# Patient Record
Sex: Male | Born: 1960 | Race: White | Hispanic: No | Marital: Married | Smoking: Current every day smoker
Health system: Southern US, Community
[De-identification: ages and names within clinical notes are randomized; demographics above are authoritative.]

## PROBLEM LIST (undated history)

## (undated) DIAGNOSIS — T7840XA Allergy, unspecified, initial encounter: Secondary | ICD-10-CM

## (undated) DIAGNOSIS — D329 Benign neoplasm of meninges, unspecified: Secondary | ICD-10-CM

## (undated) DIAGNOSIS — R51 Headache: Secondary | ICD-10-CM

## (undated) DIAGNOSIS — G629 Polyneuropathy, unspecified: Secondary | ICD-10-CM

## (undated) DIAGNOSIS — I10 Essential (primary) hypertension: Secondary | ICD-10-CM

## (undated) DIAGNOSIS — J449 Chronic obstructive pulmonary disease, unspecified: Secondary | ICD-10-CM

## (undated) DIAGNOSIS — E119 Type 2 diabetes mellitus without complications: Secondary | ICD-10-CM

## (undated) DIAGNOSIS — R918 Other nonspecific abnormal finding of lung field: Secondary | ICD-10-CM

## (undated) DIAGNOSIS — R519 Headache, unspecified: Secondary | ICD-10-CM

## (undated) DIAGNOSIS — F1010 Alcohol abuse, uncomplicated: Secondary | ICD-10-CM

## (undated) DIAGNOSIS — G9340 Encephalopathy, unspecified: Secondary | ICD-10-CM

## (undated) DIAGNOSIS — F32A Depression, unspecified: Secondary | ICD-10-CM

## (undated) DIAGNOSIS — R270 Ataxia, unspecified: Secondary | ICD-10-CM

## (undated) DIAGNOSIS — F329 Major depressive disorder, single episode, unspecified: Secondary | ICD-10-CM

## (undated) DIAGNOSIS — D649 Anemia, unspecified: Secondary | ICD-10-CM

## (undated) DIAGNOSIS — D332 Benign neoplasm of brain, unspecified: Secondary | ICD-10-CM

## (undated) DIAGNOSIS — R4189 Other symptoms and signs involving cognitive functions and awareness: Secondary | ICD-10-CM

## (undated) DIAGNOSIS — G5600 Carpal tunnel syndrome, unspecified upper limb: Secondary | ICD-10-CM

## (undated) DIAGNOSIS — J9601 Acute respiratory failure with hypoxia: Secondary | ICD-10-CM

## (undated) DIAGNOSIS — R569 Unspecified convulsions: Secondary | ICD-10-CM

## (undated) DIAGNOSIS — R0600 Dyspnea, unspecified: Secondary | ICD-10-CM

## (undated) DIAGNOSIS — N401 Enlarged prostate with lower urinary tract symptoms: Secondary | ICD-10-CM

## (undated) HISTORY — DX: Benign neoplasm of brain, unspecified: D33.2

## (undated) HISTORY — DX: Polyneuropathy, unspecified: G62.9

## (undated) HISTORY — PX: HERNIA REPAIR: SHX51

## (undated) HISTORY — DX: Essential (primary) hypertension: I10

## (undated) HISTORY — DX: Allergy, unspecified, initial encounter: T78.40XA

## (undated) HISTORY — DX: Carpal tunnel syndrome, unspecified upper limb: G56.00

## (undated) HISTORY — PX: VASECTOMY: SHX75

## (undated) MED FILL — Dexamethasone Sodium Phosphate Inj 100 MG/10ML: INTRAMUSCULAR | Qty: 1 | Status: AC

---
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
## 2009-11-06 ENCOUNTER — Ambulatory Visit: Payer: Self-pay | Admitting: Adult Health

## 2010-04-19 ENCOUNTER — Ambulatory Visit: Payer: Self-pay

## 2010-05-09 DIAGNOSIS — E119 Type 2 diabetes mellitus without complications: Secondary | ICD-10-CM | POA: Insufficient documentation

## 2010-05-22 ENCOUNTER — Ambulatory Visit: Payer: Self-pay

## 2010-06-13 ENCOUNTER — Emergency Department: Payer: Self-pay | Admitting: Emergency Medicine

## 2010-07-16 ENCOUNTER — Ambulatory Visit: Payer: Self-pay

## 2011-01-13 ENCOUNTER — Emergency Department: Payer: Self-pay | Admitting: Unknown Physician Specialty

## 2011-01-19 ENCOUNTER — Emergency Department: Payer: Self-pay | Admitting: Internal Medicine

## 2011-02-01 ENCOUNTER — Emergency Department: Payer: Self-pay | Admitting: Emergency Medicine

## 2011-06-17 DIAGNOSIS — I10 Essential (primary) hypertension: Secondary | ICD-10-CM | POA: Insufficient documentation

## 2014-03-25 ENCOUNTER — Emergency Department: Payer: Self-pay | Admitting: Emergency Medicine

## 2014-03-25 LAB — CBC
HCT: 44.8 % (ref 40.0–52.0)
HGB: 14.8 g/dL (ref 13.0–18.0)
MCH: 33 pg (ref 26.0–34.0)
MCHC: 33 g/dL (ref 32.0–36.0)
MCV: 100 fL (ref 80–100)
Platelet: 185 10*3/uL (ref 150–440)
RBC: 4.49 10*6/uL (ref 4.40–5.90)
RDW: 13.1 % (ref 11.5–14.5)
WBC: 6.9 10*3/uL (ref 3.8–10.6)

## 2014-03-25 LAB — URINALYSIS, COMPLETE
Bilirubin,UR: NEGATIVE
Blood: NEGATIVE
Glucose,UR: NEGATIVE mg/dL (ref 0–75)
Leukocyte Esterase: NEGATIVE
NITRITE: NEGATIVE
Ph: 5 (ref 4.5–8.0)
Protein: NEGATIVE
RBC,UR: 1 /HPF (ref 0–5)
Specific Gravity: 1.027 (ref 1.003–1.030)
Squamous Epithelial: 3
WBC UR: 1 /HPF (ref 0–5)

## 2014-03-25 LAB — COMPREHENSIVE METABOLIC PANEL
ALT: 39 U/L
Albumin: 3.5 g/dL (ref 3.4–5.0)
Alkaline Phosphatase: 74 U/L
Anion Gap: 7 (ref 7–16)
BUN: 7 mg/dL (ref 7–18)
Bilirubin,Total: 0.4 mg/dL (ref 0.2–1.0)
CHLORIDE: 103 mmol/L (ref 98–107)
CO2: 28 mmol/L (ref 21–32)
Calcium, Total: 9.5 mg/dL (ref 8.5–10.1)
Creatinine: 1.09 mg/dL (ref 0.60–1.30)
EGFR (Non-African Amer.): >60
Glucose: 133 mg/dL — ABNORMAL HIGH (ref 65–99)
OSMOLALITY: 276 (ref 275–301)
POTASSIUM: 3.7 mmol/L (ref 3.5–5.1)
SGOT(AST): 39 U/L — ABNORMAL HIGH (ref 15–37)
SODIUM: 138 mmol/L (ref 136–145)
TOTAL PROTEIN: 8.1 g/dL (ref 6.4–8.2)

## 2014-03-26 ENCOUNTER — Emergency Department: Payer: Self-pay | Admitting: Emergency Medicine

## 2014-03-26 LAB — CBC WITH DIFFERENTIAL/PLATELET
Basophil #: 0.1 10*3/uL (ref 0.0–0.1)
Basophil %: 1.2 %
EOS ABS: 0.2 10*3/uL (ref 0.0–0.7)
Eosinophil %: 2.6 %
HCT: 41.2 % (ref 40.0–52.0)
HGB: 14.2 g/dL (ref 13.0–18.0)
LYMPHS ABS: 1.5 10*3/uL (ref 1.0–3.6)
Lymphocyte %: 22.1 %
MCH: 33.8 pg (ref 26.0–34.0)
MCHC: 34.5 g/dL (ref 32.0–36.0)
MCV: 98 fL (ref 80–100)
MONOS PCT: 8.1 %
Monocyte #: 0.5 x10 3/mm (ref 0.2–1.0)
NEUTROS PCT: 66 %
Neutrophil #: 4.4 10*3/uL (ref 1.4–6.5)
PLATELETS: 192 10*3/uL (ref 150–440)
RBC: 4.21 10*6/uL — ABNORMAL LOW (ref 4.40–5.90)
RDW: 13.3 % (ref 11.5–14.5)
WBC: 6.7 10*3/uL (ref 3.8–10.6)

## 2014-03-26 LAB — COMPREHENSIVE METABOLIC PANEL
ALBUMIN: 3 g/dL — AB (ref 3.4–5.0)
ALK PHOS: 55 U/L
AST: 64 U/L — AB (ref 15–37)
Anion Gap: 9 (ref 7–16)
BILIRUBIN TOTAL: 0.3 mg/dL (ref 0.2–1.0)
BUN: 5 mg/dL — AB (ref 7–18)
CALCIUM: 8.3 mg/dL — AB (ref 8.5–10.1)
CHLORIDE: 105 mmol/L (ref 98–107)
CO2: 26 mmol/L (ref 21–32)
CREATININE: 0.9 mg/dL (ref 0.60–1.30)
EGFR (Non-African Amer.): >60
Glucose: 87 mg/dL (ref 65–99)
Osmolality: 276 (ref 275–301)
POTASSIUM: 3.8 mmol/L (ref 3.5–5.1)
SGPT (ALT): 42 U/L
Sodium: 140 mmol/L (ref 136–145)
Total Protein: 7.4 g/dL (ref 6.4–8.2)

## 2014-03-30 LAB — CULTURE, BLOOD (SINGLE)

## 2014-11-02 DIAGNOSIS — N401 Enlarged prostate with lower urinary tract symptoms: Secondary | ICD-10-CM | POA: Insufficient documentation

## 2014-11-02 DIAGNOSIS — R351 Nocturia: Principal | ICD-10-CM

## 2014-11-14 ENCOUNTER — Ambulatory Visit: Payer: Self-pay

## 2014-11-21 LAB — HEPATIC FUNCTION PANEL
ALK PHOS: 93 U/L (ref 25–125)
ALT: 34 U/L (ref 10–40)
AST: 47 U/L — AB (ref 14–40)
Bilirubin, Total: 0.7 mg/dL

## 2014-11-21 LAB — HEMOGLOBIN A1C: Hemoglobin A1C: 5.4

## 2014-11-21 LAB — BASIC METABOLIC PANEL
BUN: 7 mg/dL (ref 4–21)
Creatinine: 0.6 mg/dL (ref 0.6–1.3)
GLUCOSE: 85 mg/dL
Potassium: 4.9 mmol/L (ref 3.4–5.3)
SODIUM: 139 mmol/L (ref 137–147)

## 2014-11-21 LAB — LIPID PANEL
Cholesterol: 202 mg/dL — AB (ref 0–200)
HDL: 104 mg/dL — AB (ref 35–70)
LDL CALC: 86 mg/dL
TRIGLYCERIDES: 60 mg/dL (ref 40–160)

## 2014-11-21 LAB — PSA: PSA: 0.2

## 2014-11-21 LAB — CBC AND DIFFERENTIAL: Neutrophils Absolute: 4 /uL

## 2014-12-19 ENCOUNTER — Ambulatory Visit: Payer: Self-pay

## 2014-12-28 ENCOUNTER — Ambulatory Visit: Payer: Self-pay

## 2014-12-28 LAB — CBC AND DIFFERENTIAL
HEMATOCRIT: 42 % (ref 41–53)
Hemoglobin: 14.4 g/dL (ref 13.5–17.5)
PLATELETS: 136 10*3/uL — AB (ref 150–399)
WBC: 5.7 10^3/mL

## 2014-12-28 LAB — TSH: TSH: 5.3 u[IU]/mL (ref 0.41–5.90)

## 2015-01-03 ENCOUNTER — Other Ambulatory Visit: Payer: Self-pay | Admitting: Nurse Practitioner

## 2015-01-03 DIAGNOSIS — D693 Immune thrombocytopenic purpura: Secondary | ICD-10-CM

## 2015-01-05 ENCOUNTER — Ambulatory Visit: Payer: Self-pay

## 2016-03-26 DIAGNOSIS — R351 Nocturia: Principal | ICD-10-CM

## 2016-03-26 DIAGNOSIS — N401 Enlarged prostate with lower urinary tract symptoms: Secondary | ICD-10-CM

## 2016-03-26 DIAGNOSIS — I10 Essential (primary) hypertension: Secondary | ICD-10-CM

## 2016-03-26 DIAGNOSIS — E119 Type 2 diabetes mellitus without complications: Secondary | ICD-10-CM

## 2016-07-19 ENCOUNTER — Encounter: Payer: Self-pay | Admitting: Emergency Medicine

## 2016-07-19 DIAGNOSIS — E119 Type 2 diabetes mellitus without complications: Secondary | ICD-10-CM | POA: Insufficient documentation

## 2016-07-19 DIAGNOSIS — I10 Essential (primary) hypertension: Secondary | ICD-10-CM | POA: Insufficient documentation

## 2016-07-19 DIAGNOSIS — M545 Low back pain: Secondary | ICD-10-CM | POA: Insufficient documentation

## 2016-07-19 DIAGNOSIS — F17210 Nicotine dependence, cigarettes, uncomplicated: Secondary | ICD-10-CM | POA: Insufficient documentation

## 2016-07-19 LAB — URINALYSIS COMPLETE WITH MICROSCOPIC (ARMC ONLY)
Bilirubin Urine: NEGATIVE
GLUCOSE, UA: NEGATIVE mg/dL
Hgb urine dipstick: NEGATIVE
Ketones, ur: NEGATIVE mg/dL
Leukocytes, UA: NEGATIVE
Nitrite: NEGATIVE
Protein, ur: NEGATIVE mg/dL
RBC / HPF: NONE SEEN RBC/hpf (ref 0–5)
Specific Gravity, Urine: 1.003 — ABNORMAL LOW (ref 1.005–1.030)
pH: 6 (ref 5.0–8.0)

## 2016-07-19 NOTE — ED Triage Notes (Signed)
Pt says he had a seizure about 3 weeks ago; has had back pain since; pain to right mid back; hematuria for more than 3 months; denies fever; pt says he's tried drinking beer, resting and BC powder with no relief;

## 2016-07-20 ENCOUNTER — Emergency Department
Admission: EM | Admit: 2016-07-20 | Discharge: 2016-07-20 | Disposition: A | Discharge status: Home / Self Care | Payer: Self-pay | Attending: Emergency Medicine | Admitting: Emergency Medicine

## 2016-07-20 DIAGNOSIS — M545 Low back pain, unspecified: Secondary | ICD-10-CM

## 2016-07-20 MED ORDER — CARISOPRODOL 350 MG PO TABS: 350.0000 mg | ORAL_TABLET | Freq: Three times a day (TID) | ORAL | 0 refills | Status: DC | PRN

## 2016-07-20 MED ORDER — DICLOFENAC SODIUM 3 % TD GEL
1.0000 | Freq: Two times a day (BID) | TRANSDERMAL | 0 refills | Status: DC | PRN
Start: 2016-07-20 — End: 2016-10-16

## 2016-07-20 NOTE — ED Notes (Signed)
Reviewed d/c instructions, follow-up care, prescriptions with pt. Pt verbalized understanding.

## 2016-07-20 NOTE — ED Notes (Signed)
MD Schaevitz at bedside. 

## 2016-07-20 NOTE — ED Notes (Signed)
Pt reports having a seizure 06/29/2016. Pt reports right lower back pain since seizure. PT reports taking OTC pain meds with no relief of pain. Pt reports he did not come to hospital after seizure. Pt has no prior history of epilepsy.   Pt c/o hematuria X 3 months. Pt c/o decreased urinary flow strength. Pt denies discharge.

## 2016-07-20 NOTE — ED Provider Notes (Signed)
[facility] Emergency Department Provider Note  ____________________________________________   First MD Initiated Contact with Patient 07/20/16 0028     (approximate)  I have reviewed the triage vital signs and the nursing notes.   HISTORY  Chief Complaint Back Pain and Hematuria    HPI Frederick Drake is a 55 y.o. male  with a history of hypertension and diabetes who is presenting with 3 weeks of right lower back pain. 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 He says that the pain started after seizure he had no sleep 3 weeks ago. He says that the seizure happened in the middle the night and he had associated confusion afterwards. He says that EMS was called that time but he did not seek further evaluation. He has not had any headache since. He has not had any other seizures. He says that he usually has 6-12 beers a day and was not drinking the day prior to the seizure because he says he was feeling "sick." No history of seizures. No unintentional weight loss. Says that he has also had intermittent hematuria over the past 3 months but says that his urine appears clear tonight. Patient is a half-pack a day smoker.    Past Medical History:  Diagnosis Date  . Allergy    Seasonal  . Carpal tunnel syndrome   . Hypertension   . Neuropathy ([place])    Both legs    Patient Active Problem List   Diagnosis Date Noted  . 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 Hypertension 06/17/2011  . Diabetes ([place]) 05/09/2010    Past Surgical History:  Procedure Laterality Date  . HERNIA REPAIR    . VASECTOMY      Prior to Admission medications   Medication Sig Start Date End Date Taking? Authorizing Provider  carisoprodol (SOMA) 350 MG tablet Take 1 tablet (350 mg total) by mouth 3 (three) times daily as needed for muscle spasms. 07/20/16 07/20/17  Orbie Pyo, MD  Diclofenac Sodium 3 % GEL Place  1 application onto the skin every 12 (twelve) hours as needed (pain). 07/20/16   Orbie Pyo, MD  finasteride (PROSCAR) 5 MG tablet Take 5 mg by mouth daily.    Historical Provider, MD  gabapentin (NEURONTIN) 300 MG capsule Take 300 mg by mouth 3 (three) times daily.    Historical Provider, MD  lisinopril (PRINIVIL,ZESTRIL) 20 MG tablet Take 20 mg by mouth daily.    Historical Provider, MD  metoprolol (LOPRESSOR) 50 MG tablet Take 50 mg by mouth 2 (two) times daily.    Historical Provider, MD  silodosin (RAPAFLO) 8 MG CAPS capsule Take 8 mg by mouth daily with breakfast.    Historical Provider, MD    Allergies Penicillins  Family History  Problem Relation Age of Onset  . Cancer Sister     Breast    Social History Social History  Substance Use Topics  . Smoking status: Current Every Day Smoker    Packs/day: 2.00    Years: 40.00    Types: Cigarettes  . Smokeless tobacco: Never Used  . Alcohol use Yes    Review of Systems Constitutional: No fever/chills Eyes: No visual changes. ENT: No sore throat. Cardiovascular: Denies chest pain. Respiratory: Denies shortness of breath. Gastrointestinal: No abdominal pain.  No nausea, no vomiting.  No diarrhea.  No constipation. Genitourinary: Negative for dysuria. Musculoskeletal: As above Skin: Negative for  rash. Neurological: Negative for headaches, focal weakness or numbness.  10-point ROS otherwise negative.  ____________________________________________   PHYSICAL EXAM:  VITAL SIGNS: ED Triage Vitals  Enc Vitals Group     BP 07/19/16 2330 (!) 147/72     Pulse Rate 07/19/16 2330 89     Resp 07/19/16 2330 18     Temp 07/19/16 2330 97.9 F (36.6 C)     Temp Source 07/19/16 2330 Oral     SpO2 07/19/16 2330 93 %     Weight 07/19/16 2330 250 lb (113.4 kg)     Height 07/19/16 2330 6\' 1"  (1.854 m)     Head Circumference --      Peak Flow --      Pain Score 07/19/16 2331 8     Pain Loc --      Pain Edu? --       Excl. in [place]? --     Constitutional: Alert and oriented. Well appearing and in no acute distress. Eyes: Conjunctivae are normal. PERRL. EOMI. Head: Atraumatic. Nose: No congestion/rhinnorhea. Mouth/Throat: Mucous membranes are moist.   Neck: No stridor.   Cardiovascular: Normal rate, regular rhythm. Grossly normal heart sounds.   Respiratory: Normal respiratory effort.  No retractions. Lungs CTAB. Gastrointestinal: Soft and nontender. No distention.  Musculoskeletal: No lower extremity tenderness nor edema.  Tenderness to the right lower lumbar muscle groups with nodularity that could very well be spasmed muscles. No midline tenderness or deformity. No bruising/ecchymosis. Neurologic:  Normal speech and language. No gross focal neurologic deficits are appreciated. No gait instability. Skin:  Skin is warm, dry and intact. No rash noted. Psychiatric: Mood and affect are normal. Speech and behavior are normal.  ____________________________________________   LABS (all labs ordered are listed, but only abnormal results are displayed)  Labs Reviewed  URINALYSIS COMPLETEWITH MICROSCOPIC ([place]) - Abnormal; Notable for the following:       Result Value   Color, Urine STRAW (*)    APPearance CLEAR (*)    Specific Gravity, Urine 1.003 (*)    Bacteria, UA RARE (*)    Squamous Epithelial / LPF 0-5 (*)    All other components within normal limits   ____________________________________________  EKG   ____________________________________________  RADIOLOGY   ____________________________________________   PROCEDURES  Procedure(s) performed:   Procedures  Critical Care performed:   ____________________________________________   INITIAL IMPRESSION / ASSESSMENT AND PLAN / ED COURSE  Pertinent labs & imaging results that were available during my care of the patient were reviewed by me and considered in my medical decision making (see chart for details).    Clinical  Course   I had a lengthy discussion about how it is abnormal to have a seizure especially in the mid 67s. I'm also concerned because of the intermittent hematuria. I offered the patient a further workup including imaging as well as blood work but he just wanted pain medications as that he will try to follow up at the upper clinic where he has been seen before. We discussed the possibility of a brain tumor causing possible seizure. It is possible he may have a mass in his abdomen or back as well that could be contributing. He is understanding of these risks. He says that he would rather follow-up as an outpatient and will take the pain control medication. Patient is clinically sober at this time. Has capacity to make medical decisions.    ____________________________________________   FINAL CLINICAL IMPRESSION(S) / ED DIAGNOSES  Final diagnoses:  Right-sided low back pain without sciatica, unspecified chronicity      NEW MEDICATIONS STARTED DURING THIS VISIT:  New Prescriptions   CARISOPRODOL (SOMA) 350 MG TABLET    Take 1 tablet (350 mg total) by mouth 3 (three) times daily as needed for muscle spasms.   DICLOFENAC SODIUM 3 % GEL    Place 1 application onto the skin every 12 (twelve) hours as needed (pain).     Note:  This document was prepared using Dragon voice recognition software and may include unintentional dictation errors.     Orbie Pyo, MD 07/20/16 [phone]

## 2016-07-20 NOTE — ED Notes (Signed)
Pt reports he has not taken any of his medications in 6 months. Pt reports his truck broke down and cannot get to MD to get prescriptions to have meds refilled.

## 2016-08-18 DIAGNOSIS — I471 Supraventricular tachycardia, unspecified: Secondary | ICD-10-CM

## 2016-08-18 HISTORY — DX: Supraventricular tachycardia, unspecified: I47.10

## 2016-10-16 ENCOUNTER — Emergency Department
Admission: EM | Admit: 2016-10-16 | Discharge: 2016-10-16 | Disposition: A | Discharge status: Other Acute Inpatient Hospital | Payer: Self-pay | Attending: Emergency Medicine | Admitting: Emergency Medicine

## 2016-10-16 ENCOUNTER — Emergency Department: Payer: Self-pay

## 2016-10-16 ENCOUNTER — Encounter: Payer: Self-pay | Admitting: Emergency Medicine

## 2016-10-16 ENCOUNTER — Inpatient Hospital Stay (HOSPITAL_COMMUNITY)
Admission: AD | Admit: 2016-10-16 | Discharge: 2016-10-25 | DRG: 100 | Disposition: A | Discharge status: Home / Self Care | Payer: Self-pay | Source: Other Acute Inpatient Hospital | Attending: Internal Medicine | Admitting: Internal Medicine

## 2016-10-16 ENCOUNTER — Inpatient Hospital Stay (HOSPITAL_COMMUNITY): Payer: Self-pay

## 2016-10-16 DIAGNOSIS — G9389 Other specified disorders of brain: Secondary | ICD-10-CM

## 2016-10-16 DIAGNOSIS — R569 Unspecified convulsions: Secondary | ICD-10-CM

## 2016-10-16 DIAGNOSIS — IMO0002 Reserved for concepts with insufficient information to code with codable children: Secondary | ICD-10-CM

## 2016-10-16 DIAGNOSIS — R451 Restlessness and agitation: Secondary | ICD-10-CM | POA: Diagnosis not present

## 2016-10-16 DIAGNOSIS — J069 Acute upper respiratory infection, unspecified: Secondary | ICD-10-CM | POA: Diagnosis not present

## 2016-10-16 DIAGNOSIS — Z880 Allergy status to penicillin: Secondary | ICD-10-CM

## 2016-10-16 DIAGNOSIS — D696 Thrombocytopenia, unspecified: Secondary | ICD-10-CM | POA: Diagnosis present

## 2016-10-16 DIAGNOSIS — E872 Acidosis, unspecified: Secondary | ICD-10-CM

## 2016-10-16 DIAGNOSIS — N400 Enlarged prostate without lower urinary tract symptoms: Secondary | ICD-10-CM | POA: Diagnosis present

## 2016-10-16 DIAGNOSIS — R4701 Aphasia: Secondary | ICD-10-CM

## 2016-10-16 DIAGNOSIS — D329 Benign neoplasm of meninges, unspecified: Secondary | ICD-10-CM

## 2016-10-16 DIAGNOSIS — D320 Benign neoplasm of cerebral meninges: Secondary | ICD-10-CM | POA: Diagnosis present

## 2016-10-16 DIAGNOSIS — G629 Polyneuropathy, unspecified: Secondary | ICD-10-CM | POA: Diagnosis present

## 2016-10-16 DIAGNOSIS — G939 Disorder of brain, unspecified: Secondary | ICD-10-CM

## 2016-10-16 DIAGNOSIS — E871 Hypo-osmolality and hyponatremia: Secondary | ICD-10-CM | POA: Diagnosis present

## 2016-10-16 DIAGNOSIS — I10 Essential (primary) hypertension: Secondary | ICD-10-CM | POA: Insufficient documentation

## 2016-10-16 DIAGNOSIS — E119 Type 2 diabetes mellitus without complications: Secondary | ICD-10-CM | POA: Insufficient documentation

## 2016-10-16 DIAGNOSIS — G40901 Epilepsy, unspecified, not intractable, with status epilepticus: Principal | ICD-10-CM

## 2016-10-16 DIAGNOSIS — F1010 Alcohol abuse, uncomplicated: Secondary | ICD-10-CM | POA: Insufficient documentation

## 2016-10-16 DIAGNOSIS — J9601 Acute respiratory failure with hypoxia: Secondary | ICD-10-CM | POA: Diagnosis present

## 2016-10-16 DIAGNOSIS — J988 Other specified respiratory disorders: Secondary | ICD-10-CM

## 2016-10-16 DIAGNOSIS — F10239 Alcohol dependence with withdrawal, unspecified: Secondary | ICD-10-CM | POA: Diagnosis present

## 2016-10-16 DIAGNOSIS — J9600 Acute respiratory failure, unspecified whether with hypoxia or hypercapnia: Secondary | ICD-10-CM

## 2016-10-16 DIAGNOSIS — F17210 Nicotine dependence, cigarettes, uncomplicated: Secondary | ICD-10-CM | POA: Insufficient documentation

## 2016-10-16 DIAGNOSIS — J9690 Respiratory failure, unspecified, unspecified whether with hypoxia or hypercapnia: Secondary | ICD-10-CM

## 2016-10-16 DIAGNOSIS — R471 Dysarthria and anarthria: Secondary | ICD-10-CM | POA: Diagnosis present

## 2016-10-16 DIAGNOSIS — G40823 Epileptic spasms, intractable, with status epilepticus: Secondary | ICD-10-CM | POA: Insufficient documentation

## 2016-10-16 DIAGNOSIS — Z9181 History of falling: Secondary | ICD-10-CM

## 2016-10-16 DIAGNOSIS — E876 Hypokalemia: Secondary | ICD-10-CM | POA: Diagnosis present

## 2016-10-16 DIAGNOSIS — Z0189 Encounter for other specified special examinations: Secondary | ICD-10-CM

## 2016-10-16 DIAGNOSIS — G9340 Encephalopathy, unspecified: Secondary | ICD-10-CM

## 2016-10-16 DIAGNOSIS — F1090 Alcohol use, unspecified, uncomplicated: Secondary | ICD-10-CM

## 2016-10-16 DIAGNOSIS — Z79899 Other long term (current) drug therapy: Secondary | ICD-10-CM | POA: Insufficient documentation

## 2016-10-16 DIAGNOSIS — R739 Hyperglycemia, unspecified: Secondary | ICD-10-CM | POA: Diagnosis present

## 2016-10-16 DIAGNOSIS — G936 Cerebral edema: Secondary | ICD-10-CM | POA: Diagnosis present

## 2016-10-16 DIAGNOSIS — E538 Deficiency of other specified B group vitamins: Secondary | ICD-10-CM | POA: Diagnosis present

## 2016-10-16 HISTORY — DX: Unspecified convulsions: R56.9

## 2016-10-16 LAB — CBC WITH DIFFERENTIAL/PLATELET
Basophils Absolute: 0.1 10*3/uL (ref 0–0.1)
Basophils Relative: 1 %
Eosinophils Absolute: 0.1 10*3/uL (ref 0–0.7)
Eosinophils Relative: 1 %
HEMATOCRIT: 47.2 % (ref 40.0–52.0)
HEMOGLOBIN: 15.3 g/dL (ref 13.0–18.0)
LYMPHS ABS: 2.3 10*3/uL (ref 1.0–3.6)
LYMPHS PCT: 17 %
MCH: 32.8 pg (ref 26.0–34.0)
MCHC: 32.4 g/dL (ref 32.0–36.0)
MCV: 101.2 fL — AB (ref 80.0–100.0)
Monocytes Absolute: 0.8 10*3/uL (ref 0.2–1.0)
Monocytes Relative: 6 %
NEUTROS ABS: 9.9 10*3/uL — AB (ref 1.4–6.5)
NEUTROS PCT: 75 %
Platelets: 171 10*3/uL (ref 150–440)
RBC: 4.66 MIL/uL (ref 4.40–5.90)
RDW: 13.4 % (ref 11.5–14.5)
WBC: 13.3 10*3/uL — AB (ref 3.8–10.6)

## 2016-10-16 LAB — BLOOD GAS, ARTERIAL
Acid-Base Excess: 3.7 mmol/L — ABNORMAL HIGH (ref 0.0–2.0)
Acid-base deficit: 3 mmol/L — ABNORMAL HIGH (ref 0.0–2.0)
Bicarbonate: 25.7 mmol/L (ref 20.0–28.0)
Bicarbonate: 26.6 mmol/L (ref 20.0–28.0)
DRAWN BY: 2520131
FIO2: 0.45
FIO2: 0.6
LHR: 20 {breaths}/min
MECHVT: 500 mL
O2 SAT: 99.3 %
O2 Saturation: 97.5 %
PATIENT TEMPERATURE: 98.6
PCO2 ART: 32.5 mmHg (ref 32.0–48.0)
PEEP: 5 cmH2O
PEEP: 5 cmH2O
PH ART: 7.523 — AB (ref 7.350–7.450)
PO2 ART: 111 mmHg — AB (ref 83.0–108.0)
PO2 ART: 207 mmHg — AB (ref 83.0–108.0)
Patient temperature: 37
RATE: 20 resp/min
VT: 570 mL
pCO2 arterial: 60 mmHg — ABNORMAL HIGH (ref 32.0–48.0)
pH, Arterial: 7.24 — ABNORMAL LOW (ref 7.350–7.450)

## 2016-10-16 LAB — URINE DRUG SCREEN, QUALITATIVE (ARMC ONLY)
AMPHETAMINES, UR SCREEN: NOT DETECTED
BENZODIAZEPINE, UR SCRN: NOT DETECTED
Barbiturates, Ur Screen: NOT DETECTED
Cannabinoid 50 Ng, Ur ~~LOC~~: POSITIVE — AB
Cocaine Metabolite,Ur ~~LOC~~: NOT DETECTED
MDMA (Ecstasy)Ur Screen: NOT DETECTED
METHADONE SCREEN, URINE: NOT DETECTED
Opiate, Ur Screen: NOT DETECTED
Phencyclidine (PCP) Ur S: NOT DETECTED
Tricyclic, Ur Screen: NOT DETECTED

## 2016-10-16 LAB — GLUCOSE, CAPILLARY: Glucose-Capillary: 193 mg/dL — ABNORMAL HIGH (ref 65–99)

## 2016-10-16 LAB — COMPREHENSIVE METABOLIC PANEL
ALK PHOS: 94 U/L (ref 38–126)
ALT: 42 U/L (ref 17–63)
AST: 79 U/L — ABNORMAL HIGH (ref 15–41)
Albumin: 4.2 g/dL (ref 3.5–5.0)
Anion gap: 24 — ABNORMAL HIGH (ref 5–15)
BILIRUBIN TOTAL: 0.6 mg/dL (ref 0.3–1.2)
BUN: 7 mg/dL (ref 6–20)
CALCIUM: 9.1 mg/dL (ref 8.9–10.3)
CO2: 14 mmol/L — AB (ref 22–32)
CREATININE: 1.04 mg/dL (ref 0.61–1.24)
Chloride: 96 mmol/L — ABNORMAL LOW (ref 101–111)
GFR calc non Af Amer: >60 mL/min (ref >60)
Glucose, Bld: 221 mg/dL — ABNORMAL HIGH (ref 65–99)
Potassium: 3.5 mmol/L (ref 3.5–5.1)
SODIUM: 134 mmol/L — AB (ref 135–145)
Total Protein: 8.6 g/dL — ABNORMAL HIGH (ref 6.5–8.1)

## 2016-10-16 LAB — URINALYSIS, COMPLETE (UACMP) WITH MICROSCOPIC
Bilirubin Urine: NEGATIVE
GLUCOSE, UA: 150 mg/dL — AB
Ketones, ur: 20 mg/dL — AB
Leukocytes, UA: NEGATIVE
Nitrite: NEGATIVE
PROTEIN: 100 mg/dL — AB
Specific Gravity, Urine: 1.012 (ref 1.005–1.030)
pH: 5 (ref 5.0–8.0)

## 2016-10-16 LAB — TROPONIN I: Troponin I: <0.03 ng/mL (ref <0.03)

## 2016-10-16 LAB — ETHANOL

## 2016-10-16 LAB — LACTIC ACID, PLASMA
LACTIC ACID, VENOUS: 5 mmol/L — AB (ref 0.5–1.9)
Lactic Acid, Venous: 11.6 mmol/L (ref 0.5–1.9)

## 2016-10-16 MED ORDER — MIDAZOLAM HCL 2 MG/2ML IJ SOLN
2.0000 mg | INTRAMUSCULAR | Status: DC | PRN
  Administered 2016-10-17 – 2016-10-19 (×7): 2 mg via INTRAVENOUS
  Filled 2016-10-16 (×7): qty 2

## 2016-10-16 MED ORDER — SODIUM CHLORIDE 0.9 % IV SOLN: 1.0000 mg | Freq: Once | INTRAVENOUS | Status: DC

## 2016-10-16 MED ORDER — DEXAMETHASONE SODIUM PHOSPHATE 10 MG/ML IJ SOLN
10.0000 mg | Freq: Once | INTRAMUSCULAR | Status: AC
  Administered 2016-10-16: 10 mg via INTRAVENOUS
  Filled 2016-10-16: qty 1

## 2016-10-16 MED ORDER — PROPOFOL 1000 MG/100ML IV EMUL
5.0000 ug/kg/min | Freq: Once | INTRAVENOUS | Status: AC
Start: 2016-10-16 — End: 2016-10-16
  Administered 2016-10-16 (×2): 100 ug/kg/min via INTRAVENOUS
  Administered 2016-10-16: 5 ug/kg/min via INTRAVENOUS

## 2016-10-16 MED ORDER — CHLORHEXIDINE GLUCONATE 0.12% ORAL RINSE (MEDLINE KIT)
15.0000 mL | Freq: Two times a day (BID) | OROMUCOSAL | Status: DC
  Administered 2016-10-17 – 2016-10-21 (×10): 15 mL via OROMUCOSAL

## 2016-10-16 MED ORDER — THIAMINE HCL 100 MG/ML IJ SOLN
100.0000 mg | Freq: Every day | INTRAMUSCULAR | Status: DC
  Administered 2016-10-17: 100 mg via INTRAVENOUS
  Filled 2016-10-16: qty 2

## 2016-10-16 MED ORDER — LORAZEPAM 2 MG/ML IJ SOLN
2.0000 mg | INTRAMUSCULAR | Status: DC | PRN
  Administered 2016-10-19: 2 mg via INTRAVENOUS
  Filled 2016-10-16: qty 1

## 2016-10-16 MED ORDER — INSULIN ASPART 100 UNIT/ML ~~LOC~~ SOLN
2.0000 [IU] | SUBCUTANEOUS | Status: DC
  Administered 2016-10-17 (×2): 4 [IU] via SUBCUTANEOUS
  Administered 2016-10-17: 2 [IU] via SUBCUTANEOUS
  Administered 2016-10-17 (×2): 6 [IU] via SUBCUTANEOUS
  Administered 2016-10-17: 2 [IU] via SUBCUTANEOUS
  Administered 2016-10-18 – 2016-10-19 (×6): 4 [IU] via SUBCUTANEOUS
  Administered 2016-10-19: 2 [IU] via SUBCUTANEOUS
  Administered 2016-10-19: 4 [IU] via SUBCUTANEOUS
  Administered 2016-10-19: 2 [IU] via SUBCUTANEOUS
  Administered 2016-10-19 – 2016-10-20 (×4): 4 [IU] via SUBCUTANEOUS
  Administered 2016-10-20: 2 [IU] via SUBCUTANEOUS
  Administered 2016-10-20: 4 [IU] via SUBCUTANEOUS
  Administered 2016-10-20: 2 [IU] via SUBCUTANEOUS
  Administered 2016-10-20: 6 [IU] via SUBCUTANEOUS
  Administered 2016-10-21: 4 [IU] via SUBCUTANEOUS
  Administered 2016-10-21 (×2): 2 [IU] via SUBCUTANEOUS
  Administered 2016-10-21 – 2016-10-22 (×3): 4 [IU] via SUBCUTANEOUS
  Administered 2016-10-22 – 2016-10-23 (×4): 2 [IU] via SUBCUTANEOUS
  Administered 2016-10-24: 4 [IU] via SUBCUTANEOUS
  Administered 2016-10-25: 2 [IU] via SUBCUTANEOUS

## 2016-10-16 MED ORDER — ETOMIDATE 2 MG/ML IV SOLN
20.0000 mg | Freq: Once | INTRAVENOUS | Status: AC
  Administered 2016-10-16: 20 mg via INTRAVENOUS

## 2016-10-16 MED ORDER — SODIUM CHLORIDE 0.9 % IV SOLN
INTRAVENOUS | Status: DC
  Administered 2016-10-16 – 2016-10-18 (×3): via INTRAVENOUS

## 2016-10-16 MED ORDER — PROPOFOL 1000 MG/100ML IV EMUL
INTRAVENOUS | Status: AC
  Administered 2016-10-16: 100 ug/kg/min via INTRAVENOUS
  Filled 2016-10-16: qty 100

## 2016-10-16 MED ORDER — ACETAMINOPHEN 650 MG RE SUPP
650.0000 mg | RECTAL | Status: DC | PRN
Start: 2016-10-16 — End: 2016-10-25

## 2016-10-16 MED ORDER — SUCCINYLCHOLINE CHLORIDE 20 MG/ML IJ SOLN
100.0000 mg | Freq: Once | INTRAMUSCULAR | Status: AC
  Administered 2016-10-16: 100 mg via INTRAVENOUS

## 2016-10-16 MED ORDER — SODIUM BICARBONATE 8.4 % IV SOLN
50.0000 meq | Freq: Once | INTRAVENOUS | Status: AC
  Administered 2016-10-16: 50 meq via INTRAVENOUS
  Filled 2016-10-16: qty 50

## 2016-10-16 MED ORDER — PANTOPRAZOLE SODIUM 40 MG PO PACK
40.0000 mg | PACK | Freq: Every day | ORAL | Status: DC
  Administered 2016-10-17 – 2016-10-20 (×5): 40 mg
  Filled 2016-10-16 (×5): qty 20

## 2016-10-16 MED ORDER — DEXMEDETOMIDINE HCL IN NACL 400 MCG/100ML IV SOLN
INTRAVENOUS | Status: AC
  Administered 2016-10-16: 0.5 ug/kg/h
  Filled 2016-10-16: qty 100

## 2016-10-16 MED ORDER — LORAZEPAM 2 MG/ML IJ SOLN
1.0000 mg | Freq: Once | INTRAMUSCULAR | Status: AC
  Administered 2016-10-16: 1 mg via INTRAVENOUS
  Filled 2016-10-16: qty 1

## 2016-10-16 MED ORDER — MIDAZOLAM HCL 5 MG/5ML IJ SOLN
4.0000 mg | Freq: Once | INTRAMUSCULAR | Status: AC
  Administered 2016-10-16: 4 mg via INTRAVENOUS

## 2016-10-16 MED ORDER — FENTANYL CITRATE (PF) 100 MCG/2ML IJ SOLN
100.0000 ug | INTRAMUSCULAR | Status: DC | PRN
  Administered 2016-10-18 – 2016-10-19 (×4): 100 ug via INTRAVENOUS

## 2016-10-16 MED ORDER — LORAZEPAM 2 MG/ML IJ SOLN
2.0000 mg | Freq: Once | INTRAMUSCULAR | Status: AC
  Administered 2016-10-16: 2 mg via INTRAVENOUS

## 2016-10-16 MED ORDER — SODIUM CHLORIDE 0.9 % IV BOLUS (SEPSIS)
1000.0000 mL | Freq: Once | INTRAVENOUS | Status: AC
  Administered 2016-10-16: 1000 mL via INTRAVENOUS

## 2016-10-16 MED ORDER — DEXMEDETOMIDINE HCL IN NACL 400 MCG/100ML IV SOLN
0.4000 ug/kg/h | INTRAVENOUS | Status: DC
  Administered 2016-10-16: 1 ug/kg/h via INTRAVENOUS
  Administered 2016-10-16: 0.5 ug/kg/h via INTRAVENOUS
  Administered 2016-10-16: 0.6 ug/kg/h via INTRAVENOUS
  Filled 2016-10-16: qty 100

## 2016-10-16 MED ORDER — ORAL CARE MOUTH RINSE
15.0000 mL | Freq: Four times a day (QID) | OROMUCOSAL | Status: DC
  Administered 2016-10-17 (×2): 15 mL via OROMUCOSAL

## 2016-10-16 MED ORDER — PROPOFOL 1000 MG/100ML IV EMUL: 0.0000 ug/kg/min | INTRAVENOUS | Status: DC

## 2016-10-16 MED ORDER — FENTANYL 2500MCG IN NS 250ML (10MCG/ML) PREMIX INFUSION
25.0000 ug/h | INTRAVENOUS | Status: DC
  Administered 2016-10-16: 100 ug/h via INTRAVENOUS
  Filled 2016-10-16 (×2): qty 250

## 2016-10-16 MED ORDER — SODIUM CHLORIDE 0.9 % IV SOLN
1500.0000 mg | Freq: Once | INTRAVENOUS | Status: AC
  Administered 2016-10-16: 1500 mg via INTRAVENOUS
  Filled 2016-10-16: qty 15

## 2016-10-16 MED ORDER — DEXMEDETOMIDINE HCL IN NACL 200 MCG/50ML IV SOLN
0.0000 ug/kg/h | INTRAVENOUS | Status: DC
  Administered 2016-10-16 – 2016-10-17 (×2): 1 ug/kg/h via INTRAVENOUS
  Filled 2016-10-16 (×2): qty 50

## 2016-10-16 MED ORDER — SODIUM CHLORIDE 0.9 % IV SOLN
25.0000 ug/h | INTRAVENOUS | Status: DC
  Administered 2016-10-17: 200 ug/h via INTRAVENOUS
  Filled 2016-10-16 (×2): qty 50

## 2016-10-16 MED ORDER — FENTANYL CITRATE (PF) 2500 MCG/50ML IJ SOLN: 25.0000 ug/h | INTRAMUSCULAR | Status: DC

## 2016-10-16 MED ORDER — SODIUM CHLORIDE 0.9 % IV SOLN
1000.0000 mg | Freq: Two times a day (BID) | INTRAVENOUS | Status: DC
  Administered 2016-10-17: 1000 mg via INTRAVENOUS
  Filled 2016-10-16 (×3): qty 10

## 2016-10-16 MED ORDER — PROPOFOL 10 MG/ML IV BOLUS
1.0000 mg/kg | Freq: Once | INTRAVENOUS | Status: AC
  Administered 2016-10-16: 200 mg via INTRAVENOUS
  Administered 2016-10-16: 113.4 mg via INTRAVENOUS

## 2016-10-16 MED ORDER — FOLIC ACID 5 MG/ML IJ SOLN
1.0000 mg | Freq: Once | INTRAMUSCULAR | Status: AC
  Administered 2016-10-16: 1 mg via INTRAVENOUS
  Filled 2016-10-16: qty 0.2

## 2016-10-16 MED ORDER — ACETAMINOPHEN 160 MG/5ML PO SOLN
650.0000 mg | Freq: Four times a day (QID) | ORAL | Status: DC | PRN
  Administered 2016-10-22 – 2016-10-24 (×3): 650 mg via ORAL
  Filled 2016-10-16 (×4): qty 20.3

## 2016-10-16 MED ORDER — LORAZEPAM 2 MG/ML IJ SOLN
INTRAMUSCULAR | Status: AC
  Filled 2016-10-16: qty 1

## 2016-10-16 NOTE — ED Notes (Signed)
[facility] at bedside 

## 2016-10-16 NOTE — Progress Notes (Signed)
[facility]Link Physician-Brief Progress Note Patient Name: Frederick Drake DOB: 1961-04-24 MRN: [id number]   Date of Service  10/16/2016  HPI/Events of Note  Fentanyl infusion fell off MAR in transfer from ER  eICU Interventions  Re-ordered     Intervention Category Minor Interventions: Routine modifications to care plan (e.g. PRN medications for pain, fever)  Simonne Maffucci 10/16/2016, 10:51 PM

## 2016-10-16 NOTE — ED Notes (Signed)
Respiratory therapy at bedside.

## 2016-10-16 NOTE — ED Notes (Signed)
Patient's pulse increased to 193 after airway was placed by Dr. Jimmye Norman. Patient was agitated, moving freely on stretcher. Dr. Jimmye Norman aware.

## 2016-10-16 NOTE — ED Notes (Signed)
[facility] left with patient. Family is following in [place].

## 2016-10-16 NOTE — ED Provider Notes (Addendum)
[facility] Emergency Department Provider Note        Time seen: ----------------------------------------- 3:12 PM on 10/16/2016 -----------------------------------------  L5 caveat: Review of systems and history is limited by altered mental status  I have reviewed the triage vital signs and the nursing notes.   HISTORY  Chief Complaint No chief complaint on file.    HPI Frederick Drake is a 56 y.o. male who presents the ER being brought by EMS after seizure-like events at home. Wife states he has had 2 seizures today. These are described as generalized tonic-clonic seizures. Patient states he never had return of normal mental status before he sees the second time. EMS arrived and he had a further seizure in route here in the ambulance. He reportedly does have a history of seizure disorder but has not had seizure medications in several years. He cannot give any further review of systems or report.   Past Medical History:  Diagnosis Date  . Allergy    Seasonal  . Carpal tunnel syndrome   . Hypertension   . Neuropathy ([place])    Both legs    Patient Active Problem List   Diagnosis Date Noted  . 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 Hypertension 06/17/2011  . Diabetes ([place]) 05/09/2010    Past Surgical History:  Procedure Laterality Date  . HERNIA REPAIR    . VASECTOMY      Allergies Penicillins  Social History Social History  Substance Use Topics  . Smoking status: Current Every Day Smoker    Packs/day: 2.00    Years: 40.00    Types: Cigarettes  . Smokeless tobacco: Never Used  . Alcohol use Yes    Review of Systems Unknown, reported seizures ____________________________________________   PHYSICAL EXAM:  VITAL SIGNS: ED Triage Vitals  Enc Vitals Group     BP      Pulse      Resp      Temp      Temp src      SpO2      Weight      Height      Head Circumference      Peak Flow      Pain Score      Pain Loc       Pain Edu?      Excl. in [place]?     Constitutional: Alert but disoriented, mild distress Eyes: Conjunctivae are markedly injected bilaterally. PERRL. Normal extraocular movements. ENT   Head: Normocephalic and atraumatic.   Nose: No congestion/rhinnorhea.   Mouth/Throat: Mucous membranes are moist.   Neck: No stridor. Cardiovascular: rapid rate, regular rhythm. No murmurs, rubs, or gallops. Respiratory: Normal respiratory effort without tachypnea nor retractions. Breath sounds are clear and equal bilaterally. No wheezes/rales/rhonchi. Gastrointestinal: Soft and nontender. Normal bowel sounds Musculoskeletal: normal range of motion in all extremities. No lower extremity edema. Bruises noted on the left lower extremity Neurologic:  No gross focal neurologic deficits are appreciated. GCS is 10 at this time. Eyes are open, no verbal communication, localizes to pain Skin:  Skin is warm, dry and intact. No rash noted. Psychiatric: Confused ____________________________________________  EKG: Interpreted by me. Interpreted by me, SVT with a rate of 170 bpm, normal QRS, long QT, ST depression is noted  ____________________________________________  ED COURSE:  Pertinent labs & imaging results that were available during my care of the patient were reviewed by me and considered in my medical decision making (see chart for details). Patient presents  to the ER status post seizures. We will assess with labs and imaging. He'll receive IV Ativan and Keppra. Clinical Course as of Oct 17 1730  Thu Oct 16, 2016  1551 Family reports he does drink heavily daily. He drinks between 1-2, 12 packs of beer daily. Family denies any drug use except for occasional THC  [JW]  1610 Plan will be for repeat blood gas in about 30 minutes. He will need intubation if his blood gas worsens.  [JW]  1707 SVT converted after propofol bolus  [JW]    Clinical Course User Index [JW] Earleen Newport, MD    Procedure Name: Intubation Date/Time: 10/16/2016 5:02 PM Performed by: Earleen Newport Pre-anesthesia Checklist: Patient identified and Emergency Drugs available Oxygen Delivery Method: Ambu bag Preoxygenation: Pre-oxygenation with 100% oxygen Intubation Type: Rapid sequence Ventilation: Mask ventilation without difficulty Laryngoscope Size: Mac and 4 Grade View: Grade II Tube size: 7.5 mm Number of attempts: 1 Airway Equipment and Method: Video-laryngoscopy Placement Confirmation: ETT inserted through vocal cords under direct vision Secured at: 23 cm Tube secured with: ETT holder Dental Injury: Teeth and Oropharynx as per pre-operative assessment  Difficulty Due To: Difficulty was unanticipated     ___________________________________________   LABS (pertinent positives/negatives)  Labs Reviewed  CBC WITH DIFFERENTIAL/PLATELET - Abnormal; Notable for the following:       Result Value   WBC 13.3 (*)    MCV 101.2 (*)    Neutro Abs 9.9 (*)    All other components within normal limits  COMPREHENSIVE METABOLIC PANEL - Abnormal; Notable for the following:    Sodium 134 (*)    Chloride 96 (*)    CO2 14 (*)    Glucose, Bld 221 (*)    Total Protein 8.6 (*)    AST 79 (*)    Anion gap 24 (*)    All other components within normal limits  URINALYSIS, COMPLETE (UACMP) WITH MICROSCOPIC - Abnormal; Notable for the following:    Color, Urine YELLOW (*)    APPearance HAZY (*)    Glucose, UA 150 (*)    Hgb urine dipstick LARGE (*)    Ketones, ur 20 (*)    Protein, ur 100 (*)    Bacteria, UA RARE (*)    Squamous Epithelial / LPF 0-5 (*)    All other components within normal limits  URINE DRUG SCREEN, QUALITATIVE ([facility] ONLY) - Abnormal; Notable for the following:    Cannabinoid 50 Ng, Ur [place] POSITIVE (*)    All other components within normal limits  BLOOD GAS, VENOUS - Abnormal; Notable for the following:    pH, Ven 7.01 (*)    pCO2, Ven 61 (*)    pO2, Ven 80.0 (*)     Bicarbonate 15.4 (*)    Acid-base deficit 16.5 (*)    All other components within normal limits  LACTIC ACID, PLASMA - Abnormal; Notable for the following:    Lactic Acid, Venous 11.6 (*)    All other components within normal limits  BLOOD GAS, ARTERIAL - Abnormal; Notable for the following:    pH, Arterial 7.24 (*)    pCO2 arterial 60 (*)    pO2, Arterial 111 (*)    Acid-base deficit 3.0 (*)    All other components within normal limits  TROPONIN I  ETHANOL  LACTIC ACID, PLASMA  CBG MONITORING, ED   CRITICAL CARE Performed by: Earleen Newport   Total critical care time: 45 minutes  Critical care time was  exclusive of separately billable procedures and treating other patients.  Critical care was necessary to treat or prevent imminent or life-threatening deterioration.  Critical care was time spent personally by me on the following activities: development of treatment plan with patient and/or surrogate as well as nursing, discussions with consultants, evaluation of patient's response to treatment, examination of patient, obtaining history from patient or surrogate, ordering and performing treatments and interventions, ordering and review of laboratory studies, ordering and review of radiographic studies, pulse oximetry and re-evaluation of patient's condition.  RADIOLOGY Images were viewed by me  CT head, chest x-ray Chest x-ray is normal IMPRESSION: 2.3 cm rounded hyperdense mass is noted in right frontal lobe with surrounding white matter edema concerning for neoplasm or malignancy. Also noted is 2.7 x 2.6 x 2.0 cm mass in left parietal cortex above left tentorium without surrounding edema. MRI of the brain with and without gadolinium administration is recommended for further evaluation. IMPRESSION: Satisfactory appearance support line and tube positions.  No active disease. IMPRESSION: Tip of orogastric tube projects over expected position of  duodenal bulb. 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 Patient presents to the ER status post multiple seizures. He has had one remote seizure but does not take seizure medications. He also is a chronic alcoholic. Mental status was not improving or return to baseline. His concern for airway protection. He underwent RSI with success. He is currently sedated on propofol. He will need transfer to a facility with continuous EEG monitoring. Also to note he did have SVT during the intubation process. To note patient has required significant amounts of sedatives to keep him calm.  Repeat blood gas showed improvement in his pH, we will increase his respiratory rate. Earleen Newport, MD   Note: This note was generated in part or whole with voice recognition software. Voice recognition is usually quite accurate but there are transcription errors that can and very often do occur. I apologize for any typographical errors that were not detected and corrected.     Earleen Newport, MD 10/16/16 [place], MD 10/16/16 1732    Earleen Newport, MD 10/16/16 1749    Earleen Newport, MD 10/16/16 [phone]

## 2016-10-16 NOTE — H&P (Signed)
PULMONARY / CRITICAL CARE MEDICINE   Name: Frederick Drake MRN: [id number] DOB: September 26, 1960    ADMISSION DATE:  10/16/2016 CONSULTATION DATE:  10/16/16  REFERRING MD:  [facility]  CHIEF COMPLAINT:  Seizures  HISTORY OF PRESENT ILLNESS:  Pt is encephelopathic; therefore, this HPI is obtained from chart review. Frederick Drake is a 56 y.o. M with PMH as outlined below.  He was taken to [facility] ED 10/16/16 with seizure like activity at home.  Per his wife, he had 2 seizures earlier that day described as generalized tonic clonic.  On EMS arrival, he had a 3rd seizure and never returned to baseline as far as mental status was concerned.  On arrival to ED, he was subsequently intubated for airway protection in the setting of status epilepticus.  CT of the head was then obtained and demonstrated 2.3cm mass in right frontal lobe with surrounding white matter edema along with 2.7 x 2.6 x 2.0cm mass in left parietal cortex above left tentorium without surrounding edema.  He was then transferred to [facility] for further evaluation by neurosurgery.  Of note, he has hx of seizure disorder but reportedly has not been on AED's since 2016.  Also has hx of chronic EtOH abuse.   PAST MEDICAL HISTORY :  He  has a past medical history of Allergy; Carpal tunnel syndrome; Hypertension; Neuropathy ([place]); and Seizures ([place]).  PAST SURGICAL HISTORY: He  has a past surgical history that includes Hernia repair and Vasectomy.  Allergies  Allergen Reactions  . Penicillins Rash    No current facility-administered medications on file prior to encounter.    Current Outpatient Prescriptions on File Prior to Encounter  Medication Sig  . finasteride (PROSCAR) 5 MG tablet Take 5 mg by mouth daily.  Marland Kitchen gabapentin (NEURONTIN) 300 MG capsule Take 300 mg by mouth 3 (three) times daily.  Marland Kitchen lisinopril (PRINIVIL,ZESTRIL) 20 MG tablet Take 20 mg by mouth daily.  . metoprolol (LOPRESSOR) 50 MG tablet Take 50 mg by mouth 2  (two) times daily.  . silodosin (RAPAFLO) 8 MG CAPS capsule Take 8 mg by mouth daily with breakfast.    FAMILY HISTORY:  His indicated that his sister is deceased.    SOCIAL HISTORY: He  reports that he has been smoking Cigarettes.  He has a 80.00 pack-year smoking history. He has never used smokeless tobacco. He reports that he drinks alcohol. He reports that he does not use drugs.  REVIEW OF SYSTEMS:   Unable to obtain as pt is encephalopathic.  SUBJECTIVE:  On vent.  VITAL SIGNS: Ht 6\' 2"  (1.88 m)   SpO2 100%   BMI 32.10 kg/m  P 73, R20, BP 167/89.  HEMODYNAMICS:    VENTILATOR SETTINGS: Vent Mode: PRVC FiO2 (%):  [45 %-60 %] 60 % Set Rate:  [20 bmp] 20 bmp Vt Set:  [500 mL-575 mL] 575 mL PEEP:  [5 cmH20] 5 cmH20 Plateau Pressure:  [15 cmH20] 15 cmH20  INTAKE / OUTPUT: No intake/output data recorded.  PHYSICAL EXAMINATION: General:  Overweight male on vent Neuro:  Sedated HEENT:  [place]/AT, pupils pinpoint, no appreciable JVD Cardiovascular:  RRR, no MRG Lungs:  Clear bilateral breath sounds Abdomen:  Soft, non-tender, non-distended Musculoskeletal:  No acute deformtiy Skin:  Grossly intact  LABS:  BMET  Recent Labs Lab 10/16/16 1514  NA 134*  K 3.5  CL 96*  CO2 14*  BUN 7  CREATININE 1.04  GLUCOSE 221*    Electrolytes  Recent Labs  Lab 10/16/16 1514  CALCIUM 9.1    CBC  Recent Labs Lab 10/16/16 1514  WBC 13.3*  HGB 15.3  HCT 47.2  PLT 171    Coag's No results for input(s): APTT, INR in the last 168 hours.  Sepsis Markers  Recent Labs Lab 10/16/16 1514 10/16/16 1736  LATICACIDVEN 11.6* 5.0*    ABG  Recent Labs Lab 10/16/16 1736  PHART 7.24*  PCO2ART 60*  PO2ART 111*    Liver Enzymes  Recent Labs Lab 10/16/16 1514  AST 79*  ALT 42  ALKPHOS 94  BILITOT 0.6  ALBUMIN 4.2    Cardiac Enzymes  Recent Labs Lab 10/16/16 1514  TROPONINI <0.03    Glucose  Recent Labs Lab 10/16/16 2018  GLUCAP 193*     Imaging Dg Chest 1 View  Result Date: 10/16/2016 CLINICAL DATA:  Endotracheal tube placement, initial encounter EXAM: CHEST 1 VIEW COMPARISON:  10/16/2016 CXR at 1518 hours FINDINGS: New endotracheal tube tip is seen 5.5 cm above the carina. Gastric tube extends below the left hemidiaphragm. Borderline cardiomegaly with atelectasis at the left lung base. No pulmonary edema, pneumothorax nor appreciable pleural effusion. IMPRESSION: Satisfactory appearance support line and tube positions. No active disease. Electronically Signed   By: Ashley Royalty M.D.   On: 10/16/2016 17:40   Dg Chest 1 View  Result Date: 10/16/2016 CLINICAL DATA:  Recent seizure activity EXAM: CHEST 1 VIEW COMPARISON:  04/19/2010 FINDINGS: The heart size and mediastinal contours are within normal limits. Both lungs are clear. The visualized skeletal structures are unremarkable. IMPRESSION: No active disease. Electronically Signed   By: Inez Catalina M.D.   On: 10/16/2016 15:38   Ct Head Wo Contrast  Result Date: 10/16/2016 CLINICAL DATA:  Seizures. EXAM: CT HEAD WITHOUT CONTRAST TECHNIQUE: Contiguous axial images were obtained from the base of the skull through the vertex without intravenous contrast. COMPARISON:  None. FINDINGS: Brain: 2.3 cm rounded hyperdense mass is noted in right frontal lobe surrounded by white matter edema. This results in 6 mm of right to left midline shift anteriorly. Ventricular size is within normal limits. There is no evidence of hemorrhage or acute infarction. There appears to be in other mass measuring 3.7 x 2.6 x 2.0 cm in the left parietal cortex just above the left tentorium ; this may simply represent meningioma. Vascular: No hyperdense vessel or unexpected calcification. Skull: Normal. Negative for fracture or focal lesion. Sinuses/Orbits: No acute finding. Other: None. IMPRESSION: 2.3 cm rounded hyperdense mass is noted in right frontal lobe with surrounding white matter edema concerning for neoplasm  or malignancy. Also noted is 2.7 x 2.6 x 2.0 cm mass in left parietal cortex above left tentorium without surrounding edema. MRI of the brain with and without gadolinium administration is recommended for further evaluation. Electronically Signed   By: Marijo Conception, M.D.   On: 10/16/2016 16:34   Dg Abd Portable 1 View  Result Date: 10/16/2016 CLINICAL DATA:  Orogastric tube placement EXAM: PORTABLE ABDOMEN - 1 VIEW COMPARISON:  Portable exam 1714 hours compared to CT abdomen and pelvis of 11/14/2014 FINDINGS: Tip of orogastric tube projects over expected position of duodenal bulb. Normal bowel gas pattern. Lung bases clear. Degenerative disc disease changes thoracolumbar spine with dextroconvex lumbar scoliosis. IMPRESSION: Tip of orogastric tube projects over expected position of duodenal bulb. Electronically Signed   By: Lavonia Dana M.D.   On: 10/16/2016 17:42     STUDIES:  CT head 03/01 > 2.3cm mass in right frontal  lobe with surrounding white matter edema along with 2.7 x 2.6 x 2.0cm mass in left parietal cortex above left tentorium without surrounding edema. CXR 03/01 > no acute process.  CULTURES: None.  ANTIBIOTICS: None.  SIGNIFICANT EVENTS: 03/01 > admit.  LINES/TUBES: ETT 03/01 >  DISCUSSION: 56 y.o. M with hx seizure disorder but not on AED's since 2016, taken to [facility] ED 03/01 with tonic clonic seizure at home and again en route to ED.  Intubated for airway protection due to status epilepticus. CT of head demonstrated 2 brain masses so transferred to [facility] for ICU admission and neurosurgery evaluation.  ASSESSMENT / PLAN:  NEUROLOGIC A:   Status epilepticus - known seizure disorder but off meds since 2016.  Now with 2 new brain masses seen on head CT. 2 brain masses - one with surrounding white matter edema. EtOH use. P:   Sedation: Propofol gtt / fentanyl PRN. RASS goal: 0 to -1. Daily WUA. Neurosurgery and neurology consulted by [facility] MD Decadron  scheduled. Hold preadmission soma, gabapentin. AEDs per neurology Further imaging as directed by neurology/neurosurgery  PULMONARY A: VDRF - due to inability to protect the airway in the setting of status epilepticus. Tobacco dependence. P:   Full vent support. Repeat ABG  Hold SBT in AM until seen by neurosurgery (likely will have trip to OR). Follow CXR. Tobacco cessation counseling once extubated.  CARDIOVASCULAR A:  Hx HTN. P:  Monitor hemodynamics. Hold preadmission lisinopril, metoprolol.  RENAL A:   Hyponatremia, mild. If worsens consider SIADH AGMA - lactate due to status epilepticus. P:   NS @ 125. Trend lactate. BMP in AM.  GASTROINTESTINAL A:   GI prophylaxis. Nutrition. P:   Pantoprazole. NPO.  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 VTE prophylaxis. P:  Follow CT chest / abd / pelv for staging purposes. Tissue sampling based on CT results. SCD's. CBC in AM.  INFECTIOUS A:   No indication of infection. P:   Monitor clinically.  ENDOCRINE A:   Hyperglycemia - anticipate worsening with steroids. P:   SSI.  FAMILY  - Updates: No family available  - Inter-disciplinary family meet or Palliative Care meeting due by:  3/7   Georgann Housekeeper, AGACNP-BC [place] [facility] Pager [phone] or [phone]  10/16/2016 10:09 PM

## 2016-10-16 NOTE — ED Notes (Signed)
Patient placed in soft wrist restraints by [facility] for ETT safety due to agitation.

## 2016-10-16 NOTE — ED Notes (Signed)
7.5 airway placed by Dr. Jimmye Norman with Fritz Pickerel from RT, Marya Amsler RN, Lucille Passy RN, and Tana Conch RN at bedside. Positive color change noted and bilateral breath sounds auscultated by Lucille Passy RN.

## 2016-10-16 NOTE — ED Notes (Signed)
Patient is back from CT scan. Family at bedside.

## 2016-10-16 NOTE — ED Notes (Signed)
Patient remains agitated, moving on stretcher, opening eyes. Dr. Jimmye Norman aware.

## 2016-10-16 NOTE — ED Notes (Signed)
Patient is calm and resting at this time. 

## 2016-10-16 NOTE — Progress Notes (Signed)
[place] Progress Note Patient Name: HICKMAN EMSWILER DOB: October 15, 1960 MRN: [id number]   Date of Service  10/16/2016  HPI/Events of Note  Agitation, BP stable, no seizures  eICU Interventions  Add fentanyl infusion for transport to [facility]     Intervention Category Major Interventions: Delirium, psychosis, severe agitation - evaluation and management  Simonne Maffucci 10/16/2016, 8:19 PM

## 2016-10-16 NOTE — ED Notes (Addendum)
[place] placed by Hulan Amato RN

## 2016-10-16 NOTE — Consult Note (Signed)
NEURO HOSPITALIST CONSULT NOTE   Requestig physician: Dr. Halford Chessman  Reason for Consult: Seizures  History obtained from:  Chart    HPI:                                                                                                                                          Frederick Drake is an 56 y.o. male who presents in transfer from [facility] for further management of seizure and intracranial masses. He was taken to the [facility] ED on 10/16/16 after having seizure like activity at home. His wife had stated that he had 2 episodes earlier in the day with generalized tonic-clonic activity. When EMS arrived, he had a 3rd seizure, following which he did not return to baseline mental status. On arrival to [facility] he was intubated for airway protection. CT head showed 2 intracranial masses: one measuring approximately 3 x 3 cm mass in the region of the right frontal lobe with surrounding white matter edema, along with a 2.7 x 2.6 x 2.0 cm mass in the left parietal region above the left tentorium without surrounding edema.   He was then transferred to [facility] for further evaluation by Neurosurgery and Neurology.  He has a past history of seizures but reportedly has not been on AED's since 2016.  He also has a history of chronic EtOH abuse.  Past Medical History:  Diagnosis Date  . Allergy    Seasonal  . Carpal tunnel syndrome   . Hypertension   . Neuropathy ([facility])    Both legs  . Seizures ([place])     Past Surgical History:  Procedure Laterality Date  . HERNIA REPAIR    . VASECTOMY      Family History  Problem Relation Age of Onset  . Cancer Sister     Breast   Social History:  reports that he has been smoking Cigarettes.  He has a 80.00 pack-year smoking history. He has never used smokeless tobacco. He reports that he drinks alcohol. He reports that he does not use drugs.  Allergies  Allergen Reactions  . Penicillins Rash    MEDICATIONS:  Scheduled: . chlorhexidine gluconate (MEDLINE KIT)  15 mL Mouth Rinse BID  . insulin aspart  2-6 Units Subcutaneous Q4H  . levETIRAcetam  1,000 mg Intravenous Q12H  . mouth rinse  15 mL Mouth Rinse QID  . pantoprazole sodium  40 mg Per Tube QHS  . thiamine injection  100 mg Intravenous Daily     ROS:                                                                                                                                       Unable to obtain due to AMS.   Height '6\' 2"'  (1.88 m), SpO2 100 %.  General Examination:                                                                                                      HEENT-  Normocephalic/atraumatic.   Lungs- Intubated Extremities- Warm and well-perfused  Neurological Examination (Performed while sedated with dexmedetomidine) Mental Status: Intubated and sedated with no responses to external stimuli.  Cranial Nerves: II: No blink to threat (sedated). Pupils 2 mm and unreactive.  III,IV, VI: eyes closed at baseline. Eyes conjugately at midline with no spontaneous eye movements. No doll's eye reflex.  V,VII: Face flaccidly symmetric. Weak corneal reflexes bilaterally VIII: No response to auditory stimuli IX,X: intubated XI: Head at midline XII: intubated Motor/Sensory: Upper and lower extremities with flaccid tone and no movement to noxious stimuli. Deep Tendon Reflexes: Hypoactive throughout with toes mute.  Cerebellar/Gait: Unable to assess.   Lab Results: Basic Metabolic Panel:  Recent Labs Lab 10/16/16 1514  NA 134*  K 3.5  CL 96*  CO2 14*  GLUCOSE 221*  BUN 7  CREATININE 1.04  CALCIUM 9.1    Liver Function Tests:  Recent Labs Lab 10/16/16 1514  AST 79*  ALT 42  ALKPHOS 94  BILITOT 0.6  PROT 8.6*  ALBUMIN 4.2   No results for input(s): LIPASE, AMYLASE in the last 168 hours. No results for input(s): AMMONIA in the last 168  hours.  CBC:  Recent Labs Lab 10/16/16 1514  WBC 13.3*  NEUTROABS 9.9*  HGB 15.3  HCT 47.2  MCV 101.2*  PLT 171    Cardiac Enzymes:  Recent Labs Lab 10/16/16 1514  TROPONINI <0.03    Lipid Panel: No results for input(s): CHOL, TRIG, HDL, CHOLHDL, VLDL, LDLCALC in the last 168 hours.  CBG:  Recent Labs Lab 10/16/16 2018  GLUCAP 193*    Microbiology: Results for orders  placed or performed in visit on 03/25/14  Culture, blood (single)     Status: None   Collection Time: 03/25/14  1:49 PM  Result Value Ref Range Status   Micro Text Report   Final       COMMENT                   NO GROWTH AEROBICALLY/ANAEROBICALLY IN 5 DAYS   ANTIBIOTIC                                                      Culture, blood (single)     Status: None   Collection Time: 03/25/14  1:49 PM  Result Value Ref Range Status   Micro Text Report   Final       COMMENT                   NO GROWTH AEROBICALLY/ANAEROBICALLY IN 5 DAYS   ANTIBIOTIC                                                        Coagulation Studies: No results for input(s): LABPROT, INR in the last 72 hours.  Imaging: Dg Chest 1 View  Result Date: 10/16/2016 CLINICAL DATA:  Endotracheal tube placement, initial encounter EXAM: CHEST 1 VIEW COMPARISON:  10/16/2016 CXR at 1518 hours FINDINGS: New endotracheal tube tip is seen 5.5 cm above the carina. Gastric tube extends below the left hemidiaphragm. Borderline cardiomegaly with atelectasis at the left lung base. No pulmonary edema, pneumothorax nor appreciable pleural effusion. IMPRESSION: Satisfactory appearance support line and tube positions. No active disease. Electronically Signed   By: Ashley Royalty M.D.   On: 10/16/2016 17:40   Dg Chest 1 View  Result Date: 10/16/2016 CLINICAL DATA:  Recent seizure activity EXAM: CHEST 1 VIEW COMPARISON:  04/19/2010 FINDINGS: The heart size and mediastinal contours are within normal limits. Both lungs are clear. The visualized  skeletal structures are unremarkable. IMPRESSION: No active disease. Electronically Signed   By: Inez Catalina M.D.   On: 10/16/2016 15:38   Ct Head Wo Contrast  Result Date: 10/16/2016 CLINICAL DATA:  Seizures. EXAM: CT HEAD WITHOUT CONTRAST TECHNIQUE: Contiguous axial images were obtained from the base of the skull through the vertex without intravenous contrast. COMPARISON:  None. FINDINGS: Brain: 2.3 cm rounded hyperdense mass is noted in right frontal lobe surrounded by white matter edema. This results in 6 mm of right to left midline shift anteriorly. Ventricular size is within normal limits. There is no evidence of hemorrhage or acute infarction. There appears to be in other mass measuring 3.7 x 2.6 x 2.0 cm in the left parietal cortex just above the left tentorium ; this may simply represent meningioma. Vascular: No hyperdense vessel or unexpected calcification. Skull: Normal. Negative for fracture or focal lesion. Sinuses/Orbits: No acute finding. Other: None. IMPRESSION: 2.3 cm rounded hyperdense mass is noted in right frontal lobe with surrounding white matter edema concerning for neoplasm or malignancy. Also noted is 2.7 x 2.6 x 2.0 cm mass in left parietal cortex above left tentorium without surrounding edema. MRI of the brain with and without gadolinium administration is  recommended for further evaluation. Electronically Signed   By: Marijo Conception, M.D.   On: 10/16/2016 16:34   Dg Abd Portable 1 View  Result Date: 10/16/2016 CLINICAL DATA:  Orogastric tube placement EXAM: PORTABLE ABDOMEN - 1 VIEW COMPARISON:  Portable exam 1714 hours compared to CT abdomen and pelvis of 11/14/2014 FINDINGS: Tip of orogastric tube projects over expected position of duodenal bulb. Normal bowel gas pattern. Lung bases clear. Degenerative disc disease changes thoracolumbar spine with dextroconvex lumbar scoliosis. IMPRESSION: Tip of orogastric tube projects over expected position of duodenal bulb. Electronically  Signed   By: Lavonia Dana M.D.   On: 10/16/2016 17:42    Assessment: 56 year old male with breakthrough seizures in the setting of intracranial meningiomas x 2 and possible EtOH withdrawal 1. Currently sedated and intubated. No clinical seizure activity is seen on examination.  2. Large meningiomas are verified by MRI brain with contrast.  3. EEG preliminarily reviewed at the bedside, revealing no electrographic seizures.   Recommendations: 1. Continue Keppra 1000 mg BID 2. When weaning off Precedex, would start the patient on scheduled benzodiazepine x 3 days for EtOH withdrawal prophylaxis, then change to PRN with CIWA protocol 3. Interview patient when he awakens to determine if EtOH withdrawal is the primary trigger 4. Also determine if the meningiomas were previously diagnosed or if this is a new diagnosis 5. Neurosurgery evaluation as outpatient for possible meningioma resection. 6. Follow up with Neurology as outpatient. Will need to be maintained on Keppra during this stay and upon discharge.  7. Agree with thiamine supplementation 8. Magnesium level  Electronically signed: Dr. Kerney Elbe 10/16/2016, 10:22 PM

## 2016-10-16 NOTE — ED Notes (Signed)
Report given to [facility]. 

## 2016-10-16 NOTE — ED Notes (Signed)
Venous blood gas drawn at 1644.

## 2016-10-16 NOTE — ED Notes (Signed)
Seizure pads in place

## 2016-10-16 NOTE — ED Notes (Signed)
Wife remains at bedside and plans to accompany the patient in the ambulance to [facility].

## 2016-10-16 NOTE — ED Notes (Signed)
Patient is agitated, reaching for ETT. Dr. Jimmye Norman aware.

## 2016-10-16 NOTE — ED Notes (Signed)
Patient left for CT scan. 

## 2016-10-16 NOTE — ED Triage Notes (Signed)
Pt in via EMS from home; EMS reports 2 seizures prior to arrival and 1 seizure en route.  Pt awake but postictal with slurred speech, combative at times.  Per EMS, pt with hx of seizures but has been unmedicated since 2016.

## 2016-10-17 ENCOUNTER — Encounter (HOSPITAL_COMMUNITY): Payer: Self-pay

## 2016-10-17 ENCOUNTER — Inpatient Hospital Stay (HOSPITAL_COMMUNITY): Payer: Self-pay

## 2016-10-17 DIAGNOSIS — F1010 Alcohol abuse, uncomplicated: Secondary | ICD-10-CM

## 2016-10-17 DIAGNOSIS — G9340 Encephalopathy, unspecified: Secondary | ICD-10-CM

## 2016-10-17 DIAGNOSIS — J9601 Acute respiratory failure with hypoxia: Secondary | ICD-10-CM

## 2016-10-17 LAB — LACTIC ACID, PLASMA
LACTIC ACID, VENOUS: 1.4 mmol/L (ref 0.5–1.9)
LACTIC ACID, VENOUS: 1.3 mmol/L (ref 0.5–1.9)
LACTIC ACID, VENOUS: 1.5 mmol/L (ref 0.5–1.9)

## 2016-10-17 LAB — CBC
HCT: 43.1 % (ref 39.0–52.0)
HEMOGLOBIN: 14.9 g/dL (ref 13.0–17.0)
MCH: 33 pg (ref 26.0–34.0)
MCHC: 34.6 g/dL (ref 30.0–36.0)
MCV: 95.4 fL (ref 78.0–100.0)
Platelets: 107 10*3/uL — ABNORMAL LOW (ref 150–400)
RBC: 4.52 MIL/uL (ref 4.22–5.81)
RDW: 12.7 % (ref 11.5–15.5)
WBC: 7.9 10*3/uL (ref 4.0–10.5)

## 2016-10-17 LAB — PHENYTOIN LEVEL, TOTAL: Phenytoin Lvl: 15.4 ug/mL (ref 10.0–20.0)

## 2016-10-17 LAB — BASIC METABOLIC PANEL
ANION GAP: 13 (ref 5–15)
BUN: 5 mg/dL — AB (ref 6–20)
CALCIUM: 9.2 mg/dL (ref 8.9–10.3)
CO2: 25 mmol/L (ref 22–32)
Chloride: 94 mmol/L — ABNORMAL LOW (ref 101–111)
Creatinine, Ser: 0.68 mg/dL (ref 0.61–1.24)
GFR calc Af Amer: >60 mL/min (ref >60)
GLUCOSE: 150 mg/dL — AB (ref 65–99)
Potassium: 3.3 mmol/L — ABNORMAL LOW (ref 3.5–5.1)
Sodium: 132 mmol/L — ABNORMAL LOW (ref 135–145)

## 2016-10-17 LAB — GLUCOSE, CAPILLARY
GLUCOSE-CAPILLARY: 208 mg/dL — AB (ref 65–99)
Glucose-Capillary: 164 mg/dL — ABNORMAL HIGH (ref 65–99)
Glucose-Capillary: 148 mg/dL — ABNORMAL HIGH (ref 65–99)
Glucose-Capillary: 171 mg/dL — ABNORMAL HIGH (ref 65–99)
Glucose-Capillary: 138 mg/dL — ABNORMAL HIGH (ref 65–99)
Glucose-Capillary: 208 mg/dL — ABNORMAL HIGH (ref 65–99)

## 2016-10-17 LAB — MRSA PCR SCREENING: MRSA BY PCR: NEGATIVE

## 2016-10-17 LAB — HEMOGLOBIN A1C
HEMOGLOBIN A1C: 5.2 % (ref 4.8–5.6)
Mean Plasma Glucose: 103 mg/dL

## 2016-10-17 LAB — MAGNESIUM: Magnesium: 1.7 mg/dL (ref 1.7–2.4)

## 2016-10-17 MED ORDER — VITAL HIGH PROTEIN PO LIQD: 1000.0000 mL | ORAL | Status: DC

## 2016-10-17 MED ORDER — SODIUM CHLORIDE 0.9 % IV SOLN
200.0000 mg | Freq: Once | INTRAVENOUS | Status: AC
  Administered 2016-10-17: 200 mg via INTRAVENOUS
  Filled 2016-10-17: qty 20

## 2016-10-17 MED ORDER — DEXAMETHASONE SODIUM PHOSPHATE 10 MG/ML IJ SOLN
6.0000 mg | Freq: Four times a day (QID) | INTRAMUSCULAR | Status: DC
  Administered 2016-10-17 – 2016-10-22 (×18): 6 mg via INTRAVENOUS
  Filled 2016-10-17 (×19): qty 0.6

## 2016-10-17 MED ORDER — THIAMINE HCL 100 MG/ML IJ SOLN
100.0000 mg | Freq: Every day | INTRAMUSCULAR | Status: DC
  Administered 2016-10-20 – 2016-10-23 (×4): 100 mg via INTRAVENOUS
  Filled 2016-10-17 (×4): qty 2

## 2016-10-17 MED ORDER — SODIUM CHLORIDE 0.9 % IV SOLN: 1500.0000 mg | Freq: Once | INTRAVENOUS | Status: DC

## 2016-10-17 MED ORDER — FOLIC ACID 5 MG/ML IJ SOLN
1.0000 mg | Freq: Every day | INTRAMUSCULAR | Status: DC
  Administered 2016-10-17 – 2016-10-23 (×7): 1 mg via INTRAVENOUS
  Filled 2016-10-17 (×7): qty 0.2

## 2016-10-17 MED ORDER — SODIUM CHLORIDE 0.9 % IV SOLN
200.0000 mg | Freq: Two times a day (BID) | INTRAVENOUS | Status: DC
  Administered 2016-10-17 – 2016-10-21 (×8): 200 mg via INTRAVENOUS
  Filled 2016-10-17 (×14): qty 20

## 2016-10-17 MED ORDER — FENTANYL 2500MCG IN NS 250ML (10MCG/ML) PREMIX INFUSION
25.0000 ug/h | INTRAVENOUS | Status: DC
  Administered 2016-10-17 – 2016-10-18 (×3): 200 ug/h via INTRAVENOUS
  Administered 2016-10-19: 275 ug/h via INTRAVENOUS
  Administered 2016-10-19 (×2): 250 ug/h via INTRAVENOUS
  Administered 2016-10-20: 300 ug/h via INTRAVENOUS
  Administered 2016-10-20 – 2016-10-21 (×3): 275 ug/h via INTRAVENOUS
  Administered 2016-10-21: 300 ug/h via INTRAVENOUS
  Filled 2016-10-17 (×11): qty 250

## 2016-10-17 MED ORDER — SODIUM CHLORIDE 0.9 % IV SOLN
100.0000 mg | Freq: Two times a day (BID) | INTRAVENOUS | Status: DC
  Filled 2016-10-17: qty 10

## 2016-10-17 MED ORDER — DEXAMETHASONE SODIUM PHOSPHATE 10 MG/ML IJ SOLN
10.0000 mg | Freq: Once | INTRAMUSCULAR | Status: AC
  Administered 2016-10-17: 10 mg via INTRAVENOUS
  Filled 2016-10-17: qty 1

## 2016-10-17 MED ORDER — LEVETIRACETAM 500 MG/5ML IV SOLN
1000.0000 mg | Freq: Once | INTRAVENOUS | Status: AC
  Administered 2016-10-17: 1000 mg via INTRAVENOUS
  Filled 2016-10-17: qty 10

## 2016-10-17 MED ORDER — GADOBENATE DIMEGLUMINE 529 MG/ML IV SOLN
20.0000 mL | Freq: Once | INTRAVENOUS | Status: AC | PRN
  Administered 2016-10-17: 20 mL via INTRAVENOUS

## 2016-10-17 MED ORDER — LORAZEPAM 2 MG/ML IJ SOLN
2.0000 mg | Freq: Once | INTRAMUSCULAR | Status: AC
  Administered 2016-10-17: 2 mg via INTRAVENOUS
  Filled 2016-10-17: qty 1

## 2016-10-17 MED ORDER — SODIUM CHLORIDE 0.9 % IV SOLN
1500.0000 mg | Freq: Two times a day (BID) | INTRAVENOUS | Status: AC
  Administered 2016-10-17 – 2016-10-20 (×7): 1500 mg via INTRAVENOUS
  Filled 2016-10-17 (×9): qty 15

## 2016-10-17 MED ORDER — DIAZEPAM 1 MG/ML PO SOLN: 5.0000 mg | Freq: Three times a day (TID) | ORAL | Status: DC

## 2016-10-17 MED ORDER — DEXMEDETOMIDINE HCL IN NACL 400 MCG/100ML IV SOLN
0.0000 ug/kg/h | INTRAVENOUS | Status: DC
  Administered 2016-10-17 (×2): 1 ug/kg/h via INTRAVENOUS
  Filled 2016-10-17: qty 100

## 2016-10-17 MED ORDER — VITAL AF 1.2 CAL PO LIQD
1000.0000 mL | ORAL | Status: DC
  Administered 2016-10-17 – 2016-10-21 (×7): 1000 mL
  Filled 2016-10-17 (×4): qty 1000

## 2016-10-17 MED ORDER — ORAL CARE MOUTH RINSE
15.0000 mL | OROMUCOSAL | Status: DC
  Administered 2016-10-17 – 2016-10-21 (×39): 15 mL via OROMUCOSAL

## 2016-10-17 MED ORDER — DEXMEDETOMIDINE HCL IN NACL 400 MCG/100ML IV SOLN
0.4000 ug/kg/h | INTRAVENOUS | Status: DC
  Administered 2016-10-17 – 2016-10-20 (×28): 1.2 ug/kg/h via INTRAVENOUS
  Administered 2016-10-21: 1 ug/kg/h via INTRAVENOUS
  Administered 2016-10-21 (×3): 1.2 ug/kg/h via INTRAVENOUS
  Administered 2016-10-21: 1 ug/kg/h via INTRAVENOUS
  Administered 2016-10-21 (×2): 1.2 ug/kg/h via INTRAVENOUS
  Administered 2016-10-22 (×2): 1 ug/kg/h via INTRAVENOUS
  Filled 2016-10-17 (×6): qty 100
  Filled 2016-10-17 (×2): qty 50
  Filled 2016-10-17 (×10): qty 100
  Filled 2016-10-17: qty 50
  Filled 2016-10-17 (×7): qty 100

## 2016-10-17 MED ORDER — POTASSIUM CHLORIDE 20 MEQ/15ML (10%) PO SOLN
40.0000 meq | Freq: Once | ORAL | Status: AC
  Administered 2016-10-17: 40 meq
  Filled 2016-10-17: qty 30

## 2016-10-17 MED ORDER — THIAMINE HCL 100 MG/ML IJ SOLN
500.0000 mg | Freq: Three times a day (TID) | INTRAVENOUS | Status: AC
  Administered 2016-10-17 – 2016-10-20 (×9): 500 mg via INTRAVENOUS
  Filled 2016-10-17 (×9): qty 5

## 2016-10-17 MED ORDER — SODIUM CHLORIDE 0.9 % IV SOLN
1500.0000 mg | INTRAVENOUS | Status: AC
  Administered 2016-10-17: 1500 mg via INTRAVENOUS
  Filled 2016-10-17: qty 30

## 2016-10-17 MED ORDER — DEXAMETHASONE SODIUM PHOSPHATE 4 MG/ML IJ SOLN
6.0000 mg | Freq: Once | INTRAMUSCULAR | Status: DC
  Filled 2016-10-17: qty 1.5

## 2016-10-17 MED ORDER — DIAZEPAM 5 MG PO TABS
5.0000 mg | ORAL_TABLET | Freq: Three times a day (TID) | ORAL | Status: DC
  Administered 2016-10-17 – 2016-10-18 (×3): 5 mg
  Filled 2016-10-17 (×4): qty 1

## 2016-10-17 MED ORDER — NICOTINE 14 MG/24HR TD PT24
14.0000 mg | MEDICATED_PATCH | Freq: Every day | TRANSDERMAL | Status: DC
  Administered 2016-10-17 – 2016-10-25 (×9): 14 mg via TRANSDERMAL
  Filled 2016-10-17 (×9): qty 1

## 2016-10-17 MED ORDER — PHENYTOIN SODIUM 50 MG/ML IJ SOLN
100.0000 mg | Freq: Three times a day (TID) | INTRAMUSCULAR | Status: DC
  Administered 2016-10-17 – 2016-10-23 (×18): 100 mg via INTRAVENOUS
  Filled 2016-10-17 (×18): qty 2

## 2016-10-17 NOTE — Progress Notes (Signed)
Pt pulled on his ET Tube holder and it wasn't secure enough and Rt changed his ET holder

## 2016-10-17 NOTE — Plan of Care (Signed)
Attempted to wean down sedation but pt acutely agitated and not following  Commands, will leave at [place] settings and monitor until MD assesses.

## 2016-10-17 NOTE — Progress Notes (Signed)
Attempted to reduce sedation.   Patient became aggressive and trying to pull at tubes and thrashing his arms in an attempt to get arms free.  Attempted to orient unsuccessful at this time.

## 2016-10-17 NOTE — Procedures (Signed)
History: 56 yo M With seizures and persistent encephalopathy.   Sedation: Precedex  Technique: This is a 21 channel routine scalp EEG performed at the bedside with bipolar and monopolar montages arranged in accordance to the international 10/20 system of electrode placement. One channel was dedicated to EKG recording.    Background:    There is a posterior dominant rhythm of 8-9 Hz. There is some delta and theta activity in the left temporal lobe as well. There are 2 brief electrographic seizures recorded arising from the left temporal lobe. There is no clear correlate on video.   Photic stimulation: Physiologic driving is not performed  EEG Abnormalities: 1) 2 brief electrographic seizures arising from the left temporal lobe  Clinical Interpretation:  This EEG recorded 2 brief subclinical electrographic seizures arising from the left temporal lobe.   Roland Rack, MD [facility] [phone]  If 7pm- 7am, please page neurology on call as listed in [place].   Roland Rack, MD [facility] [phone]  If 7pm- 7am, please page neurology on call as listed in [place].

## 2016-10-17 NOTE — Progress Notes (Signed)
Initial Nutrition Assessment  DOCUMENTATION CODES:   Not applicable  INTERVENTION:   Initiate TF via OGT with Vital AF 1.2 at goal rate of 80 ml/h (1920 ml per day) to provide 2304 kcals, 144 gm protein, 1557 ml free water daily.  NUTRITION DIAGNOSIS:   Inadequate oral intake related to inability to eat as evidenced by NPO status.  GOAL:   Patient will meet greater than or equal to 90% of their needs  MONITOR:   Vent status, Labs, Weight trends, TF tolerance, Skin, I & O's  REASON FOR ASSESSMENT:   Consult, Ventilator Enteral/tube feeding initiation and management  ASSESSMENT:   56 y.o. male with meningioma seen on MRI of the brain. Question possible alcohol withdrawal as etiology of seizures. Neurology following.  Pt admitted with status ellipticus with brain mass x 2.   Patient is currently intubated on ventilator support. OGT in place; placement of tip of tube confirmed in duodenum.  MV: 7.9 L/min Temp (24hrs), Avg:98.8 F (37.1 C), Min:97.8 F (36.6 C), Max:100.5 F (38.1 C)  Nutrition-Focused physical exam completed. Findings are no fat depletion, mild muscle depletion, and no edema.   Case discussed with RN.   Labs reviewed: CBGS: 148-171.   Diet Order:  Diet NPO time specified  Skin:  Reviewed, no issues  Last BM:  PTA  Height:   Ht Readings from Last 1 Encounters:  10/17/16 6\' 2"  (1.88 m)    Weight:   Wt Readings from Last 1 Encounters:  10/17/16 230 lb 9.6 oz (104.6 kg)    Ideal Body Weight:  86.4 kg  BMI:  Body mass index is 29.61 kg/m.  Estimated Nutritional Needs:   Kcal:  2271.8  Protein:  125-150 grams  Fluid:  > 2.0 L  EDUCATION NEEDS:   No education needs identified at this time  Lujain Kraszewski A. Jimmye Norman, RD, LDN, CDE Pager: [phone] After hours Pager: [phone]

## 2016-10-17 NOTE — Progress Notes (Signed)
Patient transported via bed to MRI with RRT.  Wife arrived prior to transport, explained what the doctor had ordered for the patient and explained 2S protocols regarding visitors.  Wife very jumpy, aggitated with noises in room.  Explained the monitors and ventilator alarms.  Daughter was with patient and both left room as we transported patient to MRI.

## 2016-10-17 NOTE — Progress Notes (Signed)
STAT EEG completed; results pending. Dr Lindzen notified. 

## 2016-10-17 NOTE — Progress Notes (Signed)
Dr. Ashok Cordia in to see patient.

## 2016-10-17 NOTE — Progress Notes (Signed)
[place] [facility] Attending Note  Presenting HPI:  56 y.o. male taken to [facility] ED 10/16/16 with seizure like activity at home.  Per his wife, he had 2 seizures earlier that day described as generalized tonic clonic.  On EMS arrival, he had a 3rd seizure and never returned to baseline as far as mental status was concerned. On arrival to ED, he was subsequently intubated for airway protection in the setting of status epilepticus.  CT of the head was then obtained and demonstrated 2.3cm mass in right frontal lobe with surrounding white matter edema along with 2.7 x 2.6 x 2.0cm mass in left parietal cortex above left tentorium without surrounding edema. He was then transferred to [facility] for further evaluation by neurosurgery. Of note, he has hx of seizure disorder but reportedly has not been on AED's since 2016.  Also has hx of chronic EtOH abuse.  Subjective:  No acute events overnight. Patient still not following commands this morning.   Review of Systems:  Unable to obtain given intubation & sedation.   Vent Mode: PRVC FiO2 (%):  [35 %-60 %] 35 % Set Rate:  [14 bmp-20 bmp] 14 bmp Vt Set:  [500 mL-570 mL] 570 mL PEEP:  [5 cmH20] 5 cmH20 Plateau Pressure:  [15 cmH20] 15 cmH20  Temp:  [98 F (36.7 C)-100.5 F (38.1 C)] 100.5 F (38.1 C) (03/02 0800) Pulse Rate:  [59-174] 59 (03/02 0900) Resp:  [14-32] 14 (03/02 0900) BP: (131-206)/(73-119) 157/78 (03/02 0900) SpO2:  [87 %-100 %] 96 % (03/02 0900) FiO2 (%):  [35 %-60 %] 35 % (03/02 0800) Weight:  [230 lb 9.6 oz (104.6 kg)-250 lb (113.4 kg)] 230 lb 9.6 oz (104.6 kg) (03/02 0500)  General:  No family at bedside. Intubated. Sedated. No distress. Integument:  Warm & dry. No rash on exposed skin. HEENT:  No scleral icterus. Endotracheal tube in place. Moist mucus membranes. Neurological:  Pupils symmetric.No spontaneous movements. Pupils symmetric & reactive.  Musculoskeletal:  No joint effusion or erythema appreciated. Symmetric  muscle bulk. Pulmonary:  Symmetric chest wall rise on ventilator. Clear breath sounds bilaterally. Cardiovascular:  Regular rate & rhythm. No appreciable JVD. Normal S1 & S2. Telemetry:  Sinus rhythm. Abdomen:  Soft. Nondistended. Normoactive bowel sounds.  LINES/TUBES: OETT 3/1 >>> Foley 3/1 >>> OGT 3/1 >>> PIV  CBC Latest Ref Rng & Units 10/17/2016 10/16/2016 12/28/2014  WBC 4.0 - 10.5 K/uL 7.9 13.3(H) 5.7  Hemoglobin 13.0 - 17.0 g/dL 14.9 15.3 14.4  Hematocrit 39.0 - 52.0 % 43.1 47.2 42  Platelets 150 - 400 K/uL 107(L) 171 136(A)    BMP Latest Ref Rng & Units 10/17/2016 10/16/2016 11/21/2014  Glucose 65 - 99 mg/dL 150(H) 221(H) -  BUN 6 - 20 mg/dL 5(L) 7 7  Creatinine 0.61 - 1.24 mg/dL 0.68 1.04 0.6  Sodium 135 - 145 mmol/L 132(L) 134(L) 139  Potassium 3.5 - 5.1 mmol/L 3.3(L) 3.5 4.9  Chloride 101 - 111 mmol/L 94(L) 96(L) -  CO2 22 - 32 mmol/L 25 14(L) -  Calcium 8.9 - 10.3 mg/dL 9.2 9.1 -   Hepatic Function Latest Ref Rng & Units 10/16/2016 11/21/2014 03/26/2014  Total Protein 6.5 - 8.1 g/dL 8.6(H) - 7.4  Albumin 3.5 - 5.0 g/dL 4.2 - 3.0(L)  AST 15 - 41 U/L 79(H) 47(A) 64(H)  ALT 17 - 63 U/L 42 34 42  Alk Phosphatase 38 - 126 U/L 94 93 55  Total Bilirubin 0.3 - 1.2 mg/dL 0.6 - 0.3  IMAGING/STUDIES: MRI BRAIN W/O 3/2:  3.2 x 5.6 x 4.8 cm LEFT temporal occipital meningioma with local invasion including LEFT sigmoid dural venous sinus invasion with low flow versus thrombosis. 3.2 x 3.4 x 1.4 cm RIGHT anterior cranial fossa meningioma with extensive vasogenic edema, potential parenchymal invasion. Abnormal signal LEFT hippocampus and to lesser extent cingulate gyrus compatible recent seizures/postictal state. No suspicious signal to suggest superimposed to suggest viral infection. PORT CXR 3/2:  Personally reviewed by me. Lung volumes. Questionable patchy right basilar opacity. Endotracheal tube in good position. Unable to appreciate enteric feeding tube which was previously confirmed with  abdominal x-ray.  MICROBIOLOGY: MRSA PCR 3/2:  Negative   ANTIBIOTICS: None.   SIGNIFICANT EVENTS: 03/01 - Admit  ASSESSMENT/PLAN:  56 y.o. male with meningioma seen on MRI of the brain. Question possible alcohol withdrawal as etiology of seizures. Neurology following.  1. Recurrent seizures: Neurology following. Keppra and fosphenytoin per neurology. Seizure precautions. EEG pending.  2. Acute hypoxic respiratory failure: Likely airway compromise in the setting of seizure. No obvious aspiration pneumonia. Holding on extubation pending improvement in mental status. 3. Acute encephalopathy: Multifactorial from chronic alcohol use as well as recurrent seizure. Continuing Precedex infusion. Treating with high-dose IV thiamine for 3 days. 4. Lactic acidosis: Likely secondary to seizure activity. Continuing to trend every 6 hours. 5. Chronic alcohol use: Starting Valium 5 mg via tube every 8 hour. Continuing daily thiamine and folic acid IV. 6. Hyperglycemia: No history of diabetes mellitus. Continuing Accu-Cheks every 4 hours with sliding scale insulin. Checking hemoglobin A1c. 7. Hypokalemia: Replacing with KCl 40 mEq via tube 1. Repeat electrolyte panel in the morning. 8. Thrombocytopenia: Unclear etiology. Question possible marrow suppression versus early liver cirrhosis/buttock sequestration. Continuing to trend cell counts daily with CBC. 9. Essential hypertension: Holding home lisinopril & Lopressor. 10. Hyponatremia:  Likely beer potomania. Monitoring electrolytes daily.  11. BPH: Foley catheter to remain in place. Holding home Rapaflo & Proscar. 12. Neuropathy: Holding Soma & gabapentin. 13. Tobacco use disorder: Placing nicotine patch to be changed daily. 14. Meningiomas:  Noted on MRI.  Prophylaxis:  SCDs & Protonix via tube qhs. Diet:  NPO. Consulting dietary for tube feedings.  Code Status: Full code per previous physician discussions. Disposition:  Patient remains intubated &  critically ill in the ICU.  Family Update:  Wife updated at bedside.   I have personally spent a total of 32 minutes of critical care time today caring for the patient, updating family at bedside, & reviewing the aptient's electronic medical record.  Sonia Baller Ashok Cordia, M.D. [facility] Pager:  [phone] After 3pm or if no response, call [phone] 9:43 AM 10/17/16

## 2016-10-17 NOTE — Progress Notes (Signed)
Dr. Cheral Marker in to evaluate patient.  EEG completed at the bedside.  Patient sedated at present.  Monitoring continues.

## 2016-10-17 NOTE — Progress Notes (Signed)
Pt transported to MRI from ICU with no complications noted.

## 2016-10-17 NOTE — Progress Notes (Signed)
LTM EEG started 

## 2016-10-17 NOTE — Progress Notes (Addendum)
Subjective: He continues to be a phasic  Exam: Vitals:   10/17/16 1300 10/17/16 1400  BP: 136/84 131/78  Pulse: (!) 59 60  Resp: 14 14  Temp:     Gen: In bed, NAD Resp: non-labored breathing, no acute distress Abd: soft, nt  Neuro: MS: Awakens to noxious stimuli, tracks across midline both directions, but does not follow any commands. CN: Pupils equal round reactive, blink to threat bilaterally. Motor: He moves both sides equally and with good strength Sensory: Intact to noxious stim  Pertinent Labs: BMP-mild hyponatremia  Impression: 56 year old male with new onset status epilepticus in the setting of brain mass. His EEG overnight did capture what appears to be a subclinical seizure. I have connected him to ongoing EEG and he is continuing to have what appear to be subclinical seizures. He has been loaded with fosphenytoin in addition to [place].  Recommendations: 1) phenytoin 100 mg 3 times a day 2) Keppra 1.5 g twice a day 3) I will add Vimpat 200 mg 1 followed by 100 mg twice a day 4) Decadron 10 mg 1 followed by 6mg  q6h 5) neurosurgical consultation   This patient is critically ill and at significant risk of neurological worsening, death and care requires constant monitoring of vital signs, hemodynamics,respiratory and cardiac monitoring, neurological assessment, discussion with family, other specialists and medical decision making of high complexity. I spent 45 minutes of neurocritical care time  in the care of  this patient.  Roland Rack, MD [facility] [phone]  If 7pm- 7am, please page neurology on call as listed in [place]. 10/17/2016  2:57 PM

## 2016-10-18 DIAGNOSIS — J9600 Acute respiratory failure, unspecified whether with hypoxia or hypercapnia: Secondary | ICD-10-CM

## 2016-10-18 DIAGNOSIS — F10231 Alcohol dependence with withdrawal delirium: Secondary | ICD-10-CM

## 2016-10-18 LAB — GLUCOSE, CAPILLARY
GLUCOSE-CAPILLARY: 175 mg/dL — AB (ref 65–99)
GLUCOSE-CAPILLARY: 191 mg/dL — AB (ref 65–99)
GLUCOSE-CAPILLARY: 156 mg/dL — AB (ref 65–99)
Glucose-Capillary: 164 mg/dL — ABNORMAL HIGH (ref 65–99)
Glucose-Capillary: 155 mg/dL — ABNORMAL HIGH (ref 65–99)

## 2016-10-18 LAB — CBC WITH DIFFERENTIAL/PLATELET
BASOS ABS: 0 10*3/uL (ref 0.0–0.1)
BASOS PCT: 0 %
Eosinophils Absolute: 0 10*3/uL (ref 0.0–0.7)
Eosinophils Relative: 0 %
HEMATOCRIT: 44.1 % (ref 39.0–52.0)
Hemoglobin: 14.9 g/dL (ref 13.0–17.0)
Lymphocytes Relative: 7 %
Lymphs Abs: 0.6 10*3/uL — ABNORMAL LOW (ref 0.7–4.0)
MCH: 32.7 pg (ref 26.0–34.0)
MCHC: 33.8 g/dL (ref 30.0–36.0)
MCV: 96.7 fL (ref 78.0–100.0)
Monocytes Absolute: 0.5 10*3/uL (ref 0.1–1.0)
Monocytes Relative: 6 %
NEUTROS ABS: 7.6 10*3/uL (ref 1.7–7.7)
NEUTROS PCT: 87 %
Platelets: 104 10*3/uL — ABNORMAL LOW (ref 150–400)
RBC: 4.56 MIL/uL (ref 4.22–5.81)
RDW: 12.5 % (ref 11.5–15.5)
WBC: 8.7 10*3/uL (ref 4.0–10.5)

## 2016-10-18 LAB — COMPREHENSIVE METABOLIC PANEL
ALT: 28 U/L (ref 17–63)
AST: 57 U/L — AB (ref 15–41)
Albumin: 3.3 g/dL — ABNORMAL LOW (ref 3.5–5.0)
Alkaline Phosphatase: 54 U/L (ref 38–126)
Anion gap: 12 (ref 5–15)
BUN: 12 mg/dL (ref 6–20)
CHLORIDE: 95 mmol/L — AB (ref 101–111)
CO2: 29 mmol/L (ref 22–32)
Calcium: 9.2 mg/dL (ref 8.9–10.3)
Creatinine, Ser: 0.76 mg/dL (ref 0.61–1.24)
Glucose, Bld: 168 mg/dL — ABNORMAL HIGH (ref 65–99)
POTASSIUM: 3.7 mmol/L (ref 3.5–5.1)
Sodium: 136 mmol/L (ref 135–145)
Total Bilirubin: 0.8 mg/dL (ref 0.3–1.2)
Total Protein: 7.2 g/dL (ref 6.5–8.1)

## 2016-10-18 LAB — MAGNESIUM: MAGNESIUM: 2 mg/dL (ref 1.7–2.4)

## 2016-10-18 LAB — PHOSPHORUS: PHOSPHORUS: 2.7 mg/dL (ref 2.5–4.6)

## 2016-10-18 MED ORDER — LORAZEPAM 2 MG/ML PO CONC
2.0000 mg | Freq: Three times a day (TID) | ORAL | Status: DC
  Administered 2016-10-18 – 2016-10-20 (×9): 2 mg
  Filled 2016-10-18 (×9): qty 1

## 2016-10-18 NOTE — Progress Notes (Signed)
vLTM EEG running. EEG maint complete. No skin breakdown. Continue to monitor

## 2016-10-18 NOTE — Procedures (Signed)
Continuous Video-EEG Monitoring Report  Patient: Frederick Drake, Frederick Drake     EEG No.ID: [id number]     DOB: May 26, 1961 Age: 56 Room#2S04  MED REC NO: [id number] Gender: Male   TECH: Estil Daft     Physician: Winfield Cunas     Referring Physician: Roland Rack     Report Date: 10/18/2016       Study Duration:         10/17/2016 11:54 to 10/18/2016 07:30 CPT Code:                  [id number] Diagnosis:                  Status epilepticus (G40.901)   History: This is a 56 year old male presenting with status epilepticus.  Video-EEG monitoring was performed to evaluate for seizures.   Technical Details:  Long-term video-EEG monitoring was performed using standard setting per the guidelines.  Briefly, a minimum of 21 electrodes were placed on scalp according to the International 10-20 or/and 10-10 Systems.  Supplemental electrodes were placed as needed.  Single EKG electrode was also used to detect cardiac arrhythmia.  Patient's behavior was continuously recorded on video simultaneously with EEG.  A minimum of 16 channels were used for data display.  Each epoch of study was reviewed manually daily and as needed using standard referential and bipolar montages.     EEG Description:  There was generalized polymorphic delta slowing.  Diffuse beta activity was also seen, which was most likely due to medication effect (e.g., propofol, benzodiazepine).  No posterior dominant rhythm or sleep architecture was present.  No epileptiform discharges or seizures were in evidence.     Impression:  This is an abnormal EEG due to the presence of generalized polymorphic delta slowing, suggesting severe encephalopathy, but medication effect could not be excluded.  No epileptiform discharges or seizures were present.       Reading Physician: Winfield Cunas, MD, PhD

## 2016-10-18 NOTE — Progress Notes (Signed)
Subjective: [place] to be agitated with any decrease in sedation.   Exam: Vitals:   10/18/16 1000 10/18/16 1100  BP: 140/78 (!) 144/80  Pulse: 64 61  Resp: 14 14  Temp:     Gen: In bed, NAD Resp: non-labored breathing, no acute distress Abd: soft, nt  Neuro: MS: Awakens to noxious stimuli, tracks across midline both directions, but does not follow any commands. CN: Pupils equal round reactive, blink to threat bilaterally. Motor: He moves both sides equally and with good strength Sensory: Intact to noxious stim  Pertinent Labs: BMP-mild hyponatremia  Impression: 56 year old male with new onset status epilepticus in the setting of brain mass. His EEG overnight did contineu to show irritability in the left hemisphere, formal read pending.   Recommendations: 1) phenytoin 100 mg 3 times a day 2) Keppra 1.5 g twice a day 3) Vimpat 200 mg 1 followed by 100 mg twice a day 4) Decadron 6mg  q6h 5) neurosurgical consultation  Roland Rack, MD [facility] [phone]  If 7pm- 7am, please page neurology on call as listed in [place]. 10/18/2016  11:50 AM

## 2016-10-18 NOTE — Plan of Care (Signed)
Problem: Nutritional: Goal: Intake of prescribed amount of daily calories will improve Outcome: Completed/Met Date Met: 10/18/16 Pt on tube feedings  Problem: Skin Integrity Impairment Risk: Goal: Risk for impaired skin integrity will decrease Outcome: Progressing Pt with bruising and skin tears covered. seizure pads added to bed

## 2016-10-18 NOTE — Progress Notes (Signed)
Unable to wean sedation, patient becomes acutely agitated, sitting up in bed, pulling at restraints. Unable to orient and unable to get patient to follow commands. PRN medication given and medications left at current settings. Wife at bedside.

## 2016-10-18 NOTE — Progress Notes (Signed)
[place] [facility] Attending Note  Presenting HPI:  56 y.o. male taken to [facility] ED 10/16/16 with seizure like activity at home.  Per his wife, he had 2 seizures earlier that day described as generalized tonic clonic.  On EMS arrival, he had a 3rd seizure and never returned to baseline as far as mental status was concerned. On arrival to ED, he was subsequently intubated for airway protection in the setting of status epilepticus.  CT of the head was then obtained and demonstrated 2.3cm mass in right frontal lobe with surrounding white matter edema along with 2.7 x 2.6 x 2.0cm mass in left parietal cortex above left tentorium without surrounding edema. He was then transferred to [facility] for further evaluation by neurosurgery. Of note, he has hx of seizure disorder but reportedly has not been on AED's since 2016.  Also has hx of chronic EtOH abuse.  IMAGING/STUDIES: MRI BRAIN W/O 3/2:  3.2 x 5.6 x 4.8 cm LEFT temporal occipital meningioma with local invasion including LEFT sigmoid dural venous sinus invasion with low flow versus thrombosis. 3.2 x 3.4 x 1.4 cm RIGHT anterior cranial fossa meningioma with extensive vasogenic edema, potential parenchymal invasion. Abnormal signal LEFT hippocampus and to lesser extent cingulate gyrus compatible recent seizures/postictal state. No suspicious signal to suggest superimposed to suggest viral infection.   MICROBIOLOGY: MRSA PCR 3/2:  Negative   ANTIBIOTICS: None.   SIGNIFICANT EVENTS: 03/01 - Admit 3/2 cEEG monitoring  Subjective:  Critically ill, sedated, intubated on cEEG monitoring Severe agitation when sedation lowered    Vent Mode: PRVC FiO2 (%):  [30 %-35 %] 30 % Set Rate:  [14 bmp] 14 bmp Vt Set:  [570 mL] 570 mL PEEP:  [5 cmH20-8 cmH20] 5 cmH20 Plateau Pressure:  [14 cmH20-16 cmH20] 16 cmH20  Temp:  [97.8 F (36.6 C)-100.5 F (38.1 C)] 99 F (37.2 C) (03/03 0330) Pulse Rate:  [58-64] 61 (03/03 0700) Resp:  [14-16] 14 (03/03  0700) BP: (92-161)/(69-88) 132/76 (03/03 0700) SpO2:  [95 %-99 %] 97 % (03/03 0700) FiO2 (%):  [30 %-35 %] 30 % (03/03 0330) Weight:  [243 lb 9.7 oz (110.5 kg)] 243 lb 9.7 oz (110.5 kg) (03/03 0430)  General:  Acutely ill  Intubated. Sedated. No distress. Integument:  Warm & dry. No rash  HEENT:  No scleral icterus. Endotracheal tube in place. Moist mucus membranes. Neurological:  Pupils symmetric.No spontaneous movements. Pupils symmetric & reactive.  Musculoskeletal:  No joint effusion or erythema appreciated. Symmetric muscle bulk. Pulmonary:  Symmetric chest wall rise on ventilator. Clear breath sounds bilaterally. Cardiovascular: brady rate & rhythm. No appreciable JVD. Normal S1 & S2. Telemetry:  Sinus rhythm. Abdomen:  Soft. Nondistended. Normoactive bowel sounds.  LINES/TUBES: OETT 3/1 >>> Foley 3/1 >>> OGT 3/1 >>> PIV  CBC Latest Ref Rng & Units 10/18/2016 10/17/2016 10/16/2016  WBC 4.0 - 10.5 K/uL 8.7 7.9 13.3(H)  Hemoglobin 13.0 - 17.0 g/dL 14.9 14.9 15.3  Hematocrit 39.0 - 52.0 % 44.1 43.1 47.2  Platelets 150 - 400 K/uL 104(L) 107(L) 171    BMP Latest Ref Rng & Units 10/18/2016 10/17/2016 10/16/2016  Glucose 65 - 99 mg/dL 168(H) 150(H) 221(H)  BUN 6 - 20 mg/dL 12 5(L) 7  Creatinine 0.61 - 1.24 mg/dL 0.76 0.68 1.04  Sodium 135 - 145 mmol/L 136 132(L) 134(L)  Potassium 3.5 - 5.1 mmol/L 3.7 3.3(L) 3.5  Chloride 101 - 111 mmol/L 95(L) 94(L) 96(L)  CO2 22 - 32 mmol/L 29 25 14(L)  Calcium  8.9 - 10.3 mg/dL 9.2 9.2 9.1   Hepatic Function Latest Ref Rng & Units 10/18/2016 10/16/2016 11/21/2014  Total Protein 6.5 - 8.1 g/dL 7.2 8.6(H) -  Albumin 3.5 - 5.0 g/dL 3.3(L) 4.2 -  AST 15 - 41 U/L 57(H) 79(H) 47(A)  ALT 17 - 63 U/L 28 42 34  Alk Phosphatase 38 - 126 U/L 54 94 93  Total Bilirubin 0.3 - 1.2 mg/dL 0.8 0.6 -     ASSESSMENT/PLAN:  56 y.o. male with meningiomas vs alcohol withdrawal as etiology of seizures. Neurology following.  1. Recurrent seizures: Neurology following.  Keppra, vimpat and fosphenytoin per neurology. Seizure precautions. EEG pending.  2. Acute hypoxic respiratory failure: For airway  protection in the setting of seizure. . Holding on extubation pending improvement in mental status & absence of withdrawal/seizure. 3. Acute encephalopathy: duet to above Ct Precedex infusion. Give high-dose IV thiamine for 3 days. 4. Lactic acidosis: Likely secondary to seizure activity. resolved 5. Chronic alcohol use: dc Valium , use ativan 2 mg PO q 8h Continuing daily thiamine and folic acid IV. 6. Hyperglycemia: No history of diabetes mellitus.SSI, HbA1c 5.2 7. Hypokalemia:  Replete as needed, Mg OK 8. Thrombocytopenia: ? Alcohol induced  Continuing to trend cell counts daily with CBC. 9. Essential hypertension: Holding home lisinopril & Lopressor. 10. Hyponatremia:  Likely beer potomania. Resolved  11. BPH: Foley catheter to remain in place. Holding home Rapaflo & Proscar. 12. Neuropathy: Holding Soma & gabapentin. 13. Tobacco use disorder: nicotine patch  14. Meningiomas:  Noted on MRI. Decadron per neuro   Prophylaxis:  SCDs & Protonix  Diet:  NPO.  tube feedings.  Code Status: Full code Disposition:  Patient remains intubated & critically ill in the ICU.  Family Update:  Wife updated 3/1    My cc time x 25m RKara MeadMD. FCCP. [place] [facility] Pager [phone] no response call [phone]     7:48 AM 10/18/16

## 2016-10-19 ENCOUNTER — Inpatient Hospital Stay (HOSPITAL_COMMUNITY): Payer: Self-pay

## 2016-10-19 LAB — GLUCOSE, CAPILLARY
GLUCOSE-CAPILLARY: 145 mg/dL — AB (ref 65–99)
GLUCOSE-CAPILLARY: 158 mg/dL — AB (ref 65–99)
GLUCOSE-CAPILLARY: 162 mg/dL — AB (ref 65–99)
GLUCOSE-CAPILLARY: 168 mg/dL — AB (ref 65–99)
GLUCOSE-CAPILLARY: 168 mg/dL — AB (ref 65–99)
Glucose-Capillary: 145 mg/dL — ABNORMAL HIGH (ref 65–99)
Glucose-Capillary: 185 mg/dL — ABNORMAL HIGH (ref 65–99)

## 2016-10-19 LAB — BASIC METABOLIC PANEL
Anion gap: 8 (ref 5–15)
BUN: 16 mg/dL (ref 6–20)
CHLORIDE: 102 mmol/L (ref 101–111)
CO2: 28 mmol/L (ref 22–32)
Calcium: 8.6 mg/dL — ABNORMAL LOW (ref 8.9–10.3)
Creatinine, Ser: 0.76 mg/dL (ref 0.61–1.24)
GFR calc Af Amer: >60 mL/min (ref >60)
GFR calc non Af Amer: >60 mL/min (ref >60)
Glucose, Bld: 134 mg/dL — ABNORMAL HIGH (ref 65–99)
POTASSIUM: 3.6 mmol/L (ref 3.5–5.1)
Sodium: 138 mmol/L (ref 135–145)

## 2016-10-19 LAB — CBC
HEMATOCRIT: 43.3 % (ref 39.0–52.0)
Hemoglobin: 14.3 g/dL (ref 13.0–17.0)
MCH: 32.6 pg (ref 26.0–34.0)
MCHC: 33 g/dL (ref 30.0–36.0)
MCV: 98.9 fL (ref 78.0–100.0)
Platelets: 91 10*3/uL — ABNORMAL LOW (ref 150–400)
RBC: 4.38 MIL/uL (ref 4.22–5.81)
RDW: 12.5 % (ref 11.5–15.5)
WBC: 14 10*3/uL — AB (ref 4.0–10.5)

## 2016-10-19 LAB — PHOSPHORUS: Phosphorus: 2.9 mg/dL (ref 2.5–4.6)

## 2016-10-19 LAB — MAGNESIUM: Magnesium: 2.2 mg/dL (ref 1.7–2.4)

## 2016-10-19 MED ORDER — SODIUM CHLORIDE 0.9% FLUSH: 10.0000 mL | INTRAVENOUS | Status: DC | PRN

## 2016-10-19 MED ORDER — CHLORHEXIDINE GLUCONATE CLOTH 2 % EX PADS
6.0000 | MEDICATED_PAD | Freq: Every day | CUTANEOUS | Status: DC
  Administered 2016-10-19 – 2016-10-24 (×6): 6 via TOPICAL

## 2016-10-19 MED ORDER — MIDAZOLAM HCL 2 MG/2ML IJ SOLN
2.0000 mg | INTRAMUSCULAR | Status: DC | PRN
  Administered 2016-10-19 – 2016-10-20 (×20): 2 mg via INTRAVENOUS
  Administered 2016-10-21: 4 mg via INTRAVENOUS
  Administered 2016-10-21 (×2): 2 mg via INTRAVENOUS
  Filled 2016-10-19 (×6): qty 2
  Filled 2016-10-19 (×4): qty 4
  Filled 2016-10-19: qty 2
  Filled 2016-10-19: qty 4
  Filled 2016-10-19 (×6): qty 2
  Filled 2016-10-19: qty 4
  Filled 2016-10-19 (×2): qty 2

## 2016-10-19 MED ORDER — LABETALOL HCL 5 MG/ML IV SOLN
10.0000 mg | INTRAVENOUS | Status: DC | PRN
  Administered 2016-10-20 – 2016-10-21 (×5): 10 mg via INTRAVENOUS
  Filled 2016-10-19 (×6): qty 4

## 2016-10-19 MED ORDER — SODIUM CHLORIDE 0.9% FLUSH
10.0000 mL | Freq: Two times a day (BID) | INTRAVENOUS | Status: DC
  Administered 2016-10-19: 20 mL
  Administered 2016-10-19 – 2016-10-23 (×9): 10 mL
  Administered 2016-10-24: 20 mL
  Administered 2016-10-24 – 2016-10-25 (×2): 10 mL

## 2016-10-19 NOTE — Progress Notes (Signed)
Noted progressive trending hypertension; Elink notified.  MD stated would put in order for labetalol for SBP>165.  Will continue to monitor.  Clyda Hurdle RN

## 2016-10-19 NOTE — Procedures (Signed)
Continuous Video-EEG Monitoring Report  Patient: Frederick Drake, Frederick Drake     EEG No.ID: [id number]     DOB: 05/30/1961 Age: 56 Room#2S04  MED REC NO: [id number] Gender: Male   TECH: Estil Daft     Physician: Winfield Cunas     Referring Physician: Roland Rack     Report Date: 10/19/2016       Study Duration:10/18/2016 07:30 to 10/19/2016 07:30 CPT [id number] Diagnosis:Status epilepticus (G40.901)  History:This is a 56 year old male presenting with status epilepticus. Video-EEG monitoring was performed to evaluate for seizures.  Technical Details:Long-term video-EEG monitoring was performed using standard setting per the guidelines. Briefly, a minimum of 21 electrodes were placed on scalp according to the International 10-20 or/and 10-10 Systems. Supplemental electrodes were placed as needed. Single EKG electrode was also used to detect cardiac arrhythmia. Patient's behavior was continuously recorded on video simultaneously with EEG. A minimum of 16 channels were used for data display. Each epoch of study was reviewed manually daily and as needed using standard referential and bipolar montages.   EEG Description: There was generalized polymorphic delta slowing. No posterior dominant rhythm or sleep architecture was present. No epileptiform discharges or seizures were in evidence.   Impression: This is an abnormal EEGdue to the presence of generalized polymorphic delta slowing, suggesting severe encephalopathy.  No epileptiform discharges or seizures were present.  Compared with the last epoch, current epoch showed no significant change.    Reading Physician:Winfield Cunas, MD, PhD

## 2016-10-19 NOTE — Consult Note (Signed)
Reason for Consult:intracranial masses Referring Physician: Sood, V  Frederick Drake is an 56 y.o. male.  HPI: whom presented to [facility] with status epilepticus. Intracranial imaging revealed two masses. Right anterior skull base, and posterior temporal parietal mass. EEG is suggesting that the left temporal lesion may be triggering the seizures.  Past Medical History:  Diagnosis Date  . Allergy    Seasonal  . Carpal tunnel syndrome   . Hypertension   . Neuropathy (HCC)    Both legs  . Seizures (HCC)     Past Surgical History:  Procedure Laterality Date  . HERNIA REPAIR    . VASECTOMY      Family History  Problem Relation Age of Onset  . Cancer Sister     Breast    Social History:  reports that he has been smoking Cigarettes.  He has a 80.00 pack-year smoking history. He has never used smokeless tobacco. He reports that he drinks alcohol. He reports that he does not use drugs.  Allergies:  Allergies  Allergen Reactions  . Penicillins Rash    Medications: I have reviewed the patient's current medications.  Results for orders placed or performed during the hospital encounter of 10/16/16 (from the past 48 hour(s))  Lactic acid, plasma     Status: None   Collection Time: 10/17/16  3:42 PM  Result Value Ref Range   Lactic Acid, Venous 1.3 0.5 - 1.9 mmol/L  Magnesium     Status: None   Collection Time: 10/17/16  3:42 PM  Result Value Ref Range   Magnesium 1.7 1.7 - 2.4 mg/dL  Glucose, capillary     Status: Abnormal   Collection Time: 10/17/16  4:13 PM  Result Value Ref Range   Glucose-Capillary 138 (H) 65 - 99 mg/dL   Comment 1 Notify RN   Glucose, capillary     Status: Abnormal   Collection Time: 10/17/16  8:53 PM  Result Value Ref Range   Glucose-Capillary 208 (H) 65 - 99 mg/dL   Comment 1 Capillary Specimen    Comment 2 Notify RN   Lactic acid, plasma     Status: None   Collection Time: 10/17/16 10:51 PM  Result Value Ref Range   Lactic Acid, Venous 1.5 0.5 -  1.9 mmol/L  Glucose, capillary     Status: Abnormal   Collection Time: 10/17/16 11:54 PM  Result Value Ref Range   Glucose-Capillary 208 (H) 65 - 99 mg/dL   Comment 1 Capillary Specimen    Comment 2 Notify RN   Comprehensive metabolic panel     Status: Abnormal   Collection Time: 10/18/16  2:35 AM  Result Value Ref Range   Sodium 136 135 - 145 mmol/L   Potassium 3.7 3.5 - 5.1 mmol/L   Chloride 95 (L) 101 - 111 mmol/L   CO2 29 22 - 32 mmol/L   Glucose, Bld 168 (H) 65 - 99 mg/dL   BUN 12 6 - 20 mg/dL   Creatinine, Ser 0.76 0.61 - 1.24 mg/dL   Calcium 9.2 8.9 - 10.3 mg/dL   Total Protein 7.2 6.5 - 8.1 g/dL   Albumin 3.3 (L) 3.5 - 5.0 g/dL   AST 57 (H) 15 - 41 U/L   ALT 28 17 - 63 U/L   Alkaline Phosphatase 54 38 - 126 U/L   Total Bilirubin 0.8 0.3 - 1.2 mg/dL   GFR calc non Af Amer >60 >60 mL/min   GFR calc Af Amer >60 >60 mL/min      Comment: (NOTE) The eGFR has been calculated using the CKD EPI equation. This calculation has not been validated in all clinical situations. eGFR's persistently <60 mL/min signify possible Chronic Kidney Disease.    Anion gap 12 5 - 15  Magnesium     Status: None   Collection Time: 10/18/16  2:35 AM  Result Value Ref Range   Magnesium 2.0 1.7 - 2.4 mg/dL  Phosphorus     Status: None   Collection Time: 10/18/16  2:35 AM  Result Value Ref Range   Phosphorus 2.7 2.5 - 4.6 mg/dL  CBC with Differential/Platelet     Status: Abnormal   Collection Time: 10/18/16  2:35 AM  Result Value Ref Range   WBC 8.7 4.0 - 10.5 K/uL   RBC 4.56 4.22 - 5.81 MIL/uL   Hemoglobin 14.9 13.0 - 17.0 g/dL   HCT 44.1 39.0 - 52.0 %   MCV 96.7 78.0 - 100.0 fL   MCH 32.7 26.0 - 34.0 pg   MCHC 33.8 30.0 - 36.0 g/dL   RDW 12.5 11.5 - 15.5 %   Platelets 104 (L) 150 - 400 K/uL    Comment: CONSISTENT WITH PREVIOUS RESULT   Neutrophils Relative % 87 %   Neutro Abs 7.6 1.7 - 7.7 K/uL   Lymphocytes Relative 7 %   Lymphs Abs 0.6 (L) 0.7 - 4.0 K/uL   Monocytes Relative 6 %    Monocytes Absolute 0.5 0.1 - 1.0 K/uL   Eosinophils Relative 0 %   Eosinophils Absolute 0.0 0.0 - 0.7 K/uL   Basophils Relative 0 %   Basophils Absolute 0.0 0.0 - 0.1 K/uL  Glucose, capillary     Status: Abnormal   Collection Time: 10/18/16  3:29 AM  Result Value Ref Range   Glucose-Capillary 175 (H) 65 - 99 mg/dL   Comment 1 Capillary Specimen    Comment 2 Notify RN   Glucose, capillary     Status: Abnormal   Collection Time: 10/18/16  8:53 AM  Result Value Ref Range   Glucose-Capillary 191 (H) 65 - 99 mg/dL   Comment 1 Capillary Specimen    Comment 2 Notify RN   Glucose, capillary     Status: Abnormal   Collection Time: 10/18/16 12:22 PM  Result Value Ref Range   Glucose-Capillary 164 (H) 65 - 99 mg/dL   Comment 1 Capillary Specimen    Comment 2 Notify RN   Glucose, capillary     Status: Abnormal   Collection Time: 10/18/16  4:02 PM  Result Value Ref Range   Glucose-Capillary 155 (H) 65 - 99 mg/dL   Comment 1 Capillary Specimen    Comment 2 Notify RN   Glucose, capillary     Status: Abnormal   Collection Time: 10/18/16  8:12 PM  Result Value Ref Range   Glucose-Capillary 156 (H) 65 - 99 mg/dL   Comment 1 Capillary Specimen    Comment 2 Notify RN   Glucose, capillary     Status: Abnormal   Collection Time: 10/19/16 12:46 AM  Result Value Ref Range   Glucose-Capillary 168 (H) 65 - 99 mg/dL   Comment 1 Capillary Specimen    Comment 2 Notify RN   CBC     Status: Abnormal   Collection Time: 10/19/16  3:08 AM  Result Value Ref Range   WBC 14.0 (H) 4.0 - 10.5 K/uL   RBC 4.38 4.22 - 5.81 MIL/uL   Hemoglobin 14.3 13.0 - 17.0 g/dL   HCT 43.3 39.0 -  52.0 %   MCV 98.9 78.0 - 100.0 fL   MCH 32.6 26.0 - 34.0 pg   MCHC 33.0 30.0 - 36.0 g/dL   RDW 12.5 11.5 - 15.5 %   Platelets 91 (L) 150 - 400 K/uL    Comment: CONSISTENT WITH PREVIOUS RESULT  Basic metabolic panel     Status: Abnormal   Collection Time: 10/19/16  3:08 AM  Result Value Ref Range   Sodium 138 135 - 145  mmol/L   Potassium 3.6 3.5 - 5.1 mmol/L   Chloride 102 101 - 111 mmol/L   CO2 28 22 - 32 mmol/L   Glucose, Bld 134 (H) 65 - 99 mg/dL   BUN 16 6 - 20 mg/dL   Creatinine, Ser 0.76 0.61 - 1.24 mg/dL   Calcium 8.6 (L) 8.9 - 10.3 mg/dL   GFR calc non Af Amer >60 >60 mL/min   GFR calc Af Amer >60 >60 mL/min    Comment: (NOTE) The eGFR has been calculated using the CKD EPI equation. This calculation has not been validated in all clinical situations. eGFR's persistently <60 mL/min signify possible Chronic Kidney Disease.    Anion gap 8 5 - 15  Magnesium     Status: None   Collection Time: 10/19/16  3:08 AM  Result Value Ref Range   Magnesium 2.2 1.7 - 2.4 mg/dL  Phosphorus     Status: None   Collection Time: 10/19/16  3:08 AM  Result Value Ref Range   Phosphorus 2.9 2.5 - 4.6 mg/dL  Glucose, capillary     Status: Abnormal   Collection Time: 10/19/16  4:46 AM  Result Value Ref Range   Glucose-Capillary 145 (H) 65 - 99 mg/dL   Comment 1 Capillary Specimen    Comment 2 Notify RN   Glucose, capillary     Status: Abnormal   Collection Time: 10/19/16  8:48 AM  Result Value Ref Range   Glucose-Capillary 158 (H) 65 - 99 mg/dL   Comment 1 Capillary Specimen    Comment 2 Notify RN   Glucose, capillary     Status: Abnormal   Collection Time: 10/19/16  1:29 PM  Result Value Ref Range   Glucose-Capillary 168 (H) 65 - 99 mg/dL   Comment 1 Capillary Specimen    Comment 2 Notify RN     Dg Chest Port 1 View  Result Date: 10/19/2016 CLINICAL DATA:  Acute respiratory failure. EXAM: PORTABLE CHEST 1 VIEW COMPARISON:  Radiograph October 17, 2016. FINDINGS: The heart size and mediastinal contours are within normal limits. Both lungs are clear. No pneumothorax or pleural effusion is noted. Endotracheal and nasogastric tubes are again noted and unchanged. The visualized skeletal structures are unremarkable. IMPRESSION: Stable support apparatus. No acute cardiopulmonary abnormality seen. Electronically  Signed   By: Marijo Conception, M.D.   On: 10/19/2016 08:13    Review of Systems  Unable to perform ROS: Medical condition  Neurological: Positive for seizures.   Blood pressure (!) 150/73, pulse (!) 57, temperature 99.7 F (37.6 C), temperature source Oral, resp. rate 14, height 6' 2" (1.88 m), weight 103.4 kg (227 lb 15.3 oz), SpO2 97 %. Physical Exam  Constitutional: He appears well-developed and well-nourished.  HENT:  Head: Normocephalic and atraumatic.  Eyes: Pupils are equal, round, and reactive to light.  Neurological: He is unresponsive.  Unable to perform any detailed exam given intubation, sedation, and mental status. Has by report followed commands earlier this am.  corneals intact, +cough  Skin:  Skin is warm and dry.    Assessment/Plan: Mr. Qazi presented to the hospital with status epilepticus, and newly diagnosed intracranial masses. The masses most likely are meningiomas. The left posterior temporal mass is not causing surrounding edema unlike the right anterior skull base meningioma. I agree that if the seizures are being triggered in the left temporal lobe that tumor resection is beneficial. However at this time multiple medical issues take great precedence over a craniotomy. Will continue to follow  Kyrielle Urbanski L 10/19/2016, 3:21 PM     

## 2016-10-19 NOTE — Progress Notes (Signed)
Subjective: Interval History:  Event at 3 am consisting of left head turn and flopping of the hands.  I reviewed the EEG at that time and there was no seizure.    Bedside EEG currently shows very low voltage generalized slowing without any epileptiform discharges.  He did have left anterior temporal epileptiform discharges on prior EEG.   MRI Brain reviewed and shows 2 large meningiomas in the right frontal and left temporal areas with mass effect in both places.   Objective: Vital signs in last 24 hours: Temp:  [98.6 F (37 C)-100 F (37.8 C)] 99.9 F (37.7 C) (03/04 0851) Pulse Rate:  [55-102] 64 (03/04 0900) Resp:  [13-30] 14 (03/04 0900) BP: (108-156)/(65-81) 136/79 (03/04 0900) SpO2:  [93 %-100 %] 96 % (03/04 0900) FiO2 (%):  [30 %] 30 % (03/04 0713) Weight:  [103.4 kg (227 lb 15.3 oz)] 103.4 kg (227 lb 15.3 oz) (03/04 0300)  Intake/Output from previous day: 03/03 0701 - 03/04 0700 In: 4965.9 [I.V.:2455.9; NG/GT:2040; IV Piggyback:470] Out: 2180 [Urine:2180] Intake/Output this shift: Total I/O In: 492.8 [I.V.:187.8; NG/GT:190; IV Piggyback:115] Out: 255 [Urine:255] Nutritional status: Diet NPO time specified  Neurologic Exam:  Stopped Fentanyl and Precedex gtt to examine. Semi-opening of the eyes to tactile stimuli.  Very agitated. Withdraws and grimaces to pain. Not following commands.  Lab Results:  Recent Labs  10/18/16 0235 10/19/16 0308  WBC 8.7 14.0*  HGB 14.9 14.3  HCT 44.1 43.3  PLT 104* 91*  NA 136 138  K 3.7 3.6  CL 95* 102  CO2 29 28  GLUCOSE 168* 134*  BUN 12 16  CREATININE 0.76 0.76  CALCIUM 9.2 8.6*   Lipid Panel No results for input(s): CHOL, TRIG, HDL, CHOLHDL, VLDL, LDLCALC in the last 72 hours.  Studies/Results: Dg Chest Port 1 View  Result Date: 10/19/2016 CLINICAL DATA:  Acute respiratory failure. EXAM: PORTABLE CHEST 1 VIEW COMPARISON:  Radiograph October 17, 2016. FINDINGS: The heart size and mediastinal contours are within normal  limits. Both lungs are clear. No pneumothorax or pleural effusion is noted. Endotracheal and nasogastric tubes are again noted and unchanged. The visualized skeletal structures are unremarkable. IMPRESSION: Stable support apparatus. No acute cardiopulmonary abnormality seen. Electronically Signed   By: Marijo Conception, M.D.   On: 10/19/2016 08:13    Medications:  Scheduled: . chlorhexidine gluconate (MEDLINE KIT)  15 mL Mouth Rinse BID  . dexamethasone  6 mg Intravenous V7Q  . folic acid  1 mg Intravenous Daily  . insulin aspart  2-6 Units Subcutaneous Q4H  . lacosamide (VIMPAT) IV  200 mg Intravenous Q12H  . levETIRAcetam  1,500 mg Intravenous Q12H  . LORazepam  2 mg Per Tube TID  . mouth rinse  15 mL Mouth Rinse 10 times per day  . nicotine  14 mg Transdermal Daily  . pantoprazole sodium  40 mg Per Tube QHS  . phenytoin (DILANTIN) IV  100 mg Intravenous Q8H  . [START ON 10/20/2016] thiamine injection  100 mg Intravenous Daily  . thiamine injection  500 mg Intravenous Q8H    Assessment/Plan:  The left temporal meningioma caused mass effect and anterior temporal seizures causing aphasia on presentation.  No evidence of cortical irritability or epileptiform discharges from the right frontal meningioma and mass effect, but this can easily lead to focal onset seizures at any time.  He is currently on 3 AEDs at maximum doses and no seizures clinically or electrographically.  He is on Dexamethasone for edema reduction  from the tumors.   He does need eventual tumor debulking at both locations.   I recommend that the hospitalist call Neurosurgery for consultation.     LOS: 3 days   Rogue Jury, MD 10/19/2016  9:19 AM

## 2016-10-19 NOTE — Progress Notes (Signed)
Pt. Had seizure activity; charted in flowsheet; ativan given; patient responded and is resting.  Clyda Hurdle RN  Will continue to monitor

## 2016-10-19 NOTE — Progress Notes (Signed)
Peripherally Inserted Central Catheter/Midline Placement  The IV Nurse has discussed with the patient and/or persons authorized to consent for the patient, the purpose of this procedure and the potential benefits and risks involved with this procedure.  The benefits include less needle sticks, lab draws from the catheter, and the patient may be discharged home with the catheter. Risks include, but not limited to, infection, bleeding, blood clot (thrombus formation), and puncture of an artery; nerve damage and irregular heartbeat and possibility to perform a PICC exchange if needed/ordered by physician.  Alternatives to this procedure were also discussed.  Bard Power PICC patient education guide, fact sheet on infection prevention and patient information card has been provided to patient /or left at bedside.  Celenia RN unable to reach wife due to incorrect phone numbers on chart, consent obtained from daughter via telephone. Pt sedated, unable to sign.  PICC/Midline Placement Documentation  PICC Double Lumen 10/19/16 PICC Right Brachial 42 cm 0 cm (Active)  Indication for Insertion or Continuance of Line Vasoactive infusions;Prolonged intravenous therapies 10/19/2016 12:00 PM  Exposed Catheter (cm) 0 cm 10/19/2016 12:00 PM  Site Assessment Clean;Dry;Intact 10/19/2016 12:00 PM  Lumen #1 Status Flushed;Saline locked;Blood return noted 10/19/2016 12:00 PM  Lumen #2 Status Flushed;Saline locked;Blood return noted 10/19/2016 12:00 PM  Dressing Type Transparent 10/19/2016 12:00 PM  Dressing Status Clean;Intact;Dry;Antimicrobial disc in place 10/19/2016 12:00 PM  Line Care Connections checked and tightened 10/19/2016 12:00 PM  Line Adjustment (NICU/IV Team Only) No 10/19/2016 12:00 PM  Dressing Intervention New dressing 10/19/2016 12:00 PM  Dressing Change Due 10/26/16 10/19/2016 12:00 PM       Rolena Infante 10/19/2016, 12:00 PM

## 2016-10-19 NOTE — Progress Notes (Signed)
[facility] Attending Note  Presenting HPI:  56 y.o. male taken to [facility] ED 10/16/16 with seizure like activity at home.  Per his wife, he had 2 seizures earlier that day described as generalized tonic clonic.  On EMS arrival, he had a 3rd seizure and never returned to baseline as far as mental status was concerned. On arrival to ED, he was subsequently intubated for airway protection in the setting of status epilepticus.  CT of the head was then obtained and demonstrated 2.3cm mass in right frontal lobe with surrounding white matter edema along with 2.7 x 2.6 x 2.0cm mass in left parietal cortex above left tentorium without surrounding edema. He was then transferred to [facility] for further evaluation by neurosurgery. Of note, he has hx of seizure disorder but reportedly has not been on AED's since 2016.  Also has hx of chronic EtOH abuse - drinks 12 pack every day.  IMAGING/STUDIES: MRI BRAIN W/O 3/2:  3.2 x 5.6 x 4.8 cm LEFT temporal occipital meningioma with local invasion including LEFT sigmoid dural venous sinus invasion with low flow versus thrombosis. 3.2 x 3.4 x 1.4 cm RIGHT anterior cranial fossa meningioma with extensive vasogenic edema, potential parenchymal invasion. Abnormal signal LEFT hippocampus and to lesser extent cingulate gyrus compatible recent seizures/postictal state. No suspicious signal to suggest superimposed to suggest viral infection.  3/3 cEEG 4p no seizure last 24h  MICROBIOLOGY: MRSA PCR 3/2:  Negative   ANTIBIOTICS: None.   SIGNIFICANT EVENTS: 03/01 - Admit 3/2 cEEG monitoring  Subjective:  Critically ill, sedated, intubated on cEEG monitoring Remains Severely agitated when sedation lowered Episode of ? Seizure noted by RN 3 am 3/4    Vent Mode: PRVC FiO2 (%):  [30 %] 30 % Set Rate:  [14 bmp] 14 bmp Vt Set:  [570 mL] 570 mL PEEP:  [5 cmH20] 5 cmH20 Plateau Pressure:  [14 cmH20-24 cmH20] 17 cmH20  Temp:  [98.6 F (37 C)-100.6 F (38.1  C)] 98.6 F (37 C) (03/04 0045) Pulse Rate:  [55-102] 58 (03/04 0713) Resp:  [13-30] 14 (03/04 0713) BP: (108-156)/(65-81) 130/73 (03/04 0713) SpO2:  [93 %-100 %] 98 % (03/04 0713) FiO2 (%):  [30 %] 30 % (03/04 0713) Weight:  [227 lb 15.3 oz (103.4 kg)] 227 lb 15.3 oz (103.4 kg) (03/04 0300)  General:  Acutely ill  Intubated. Sedated. No distress. Integument:  Warm & dry. No rash  HEENT:  No scleral icterus. ETT in place. Moist mucus membranes. Neurological:  Pupils symmetric.No spontaneous movements. Pupils symmetric & reactive. Follows 1 step commands when sedation lowered Musculoskeletal:  No joint effusion or erythema appreciated. Symmetric muscle bulk. Pulmonary:  Symmetric chest wall rise on ventilator. Clear breath sounds bilaterally. Cardiovascular: brady rate & rhythm. No appreciable JVD. Normal S1 & S2. Telemetry:  Sinus rhythm. Abdomen:  Soft. Nondistended. Normoactive bowel sounds.  LINES/TUBES: OETT 3/1 >>> Foley 3/1 >>> OGT 3/1 >>> PIV  CBC Latest Ref Rng & Units 10/19/2016 10/18/2016 10/17/2016  WBC 4.0 - 10.5 K/uL 14.0(H) 8.7 7.9  Hemoglobin 13.0 - 17.0 g/dL 14.3 14.9 14.9  Hematocrit 39.0 - 52.0 % 43.3 44.1 43.1  Platelets 150 - 400 K/uL 91(L) 104(L) 107(L)    BMP Latest Ref Rng & Units 10/19/2016 10/18/2016 10/17/2016  Glucose 65 - 99 mg/dL 134(H) 168(H) 150(H)  BUN 6 - 20 mg/dL 16 12 5(L)  Creatinine 0.61 - 1.24 mg/dL 0.76 0.76 0.68  Sodium 135 - 145 mmol/L 138 136 132(L)  Potassium 3.5 -  5.1 mmol/L 3.6 3.7 3.3(L)  Chloride 101 - 111 mmol/L 102 95(L) 94(L)  CO2 22 - 32 mmol/L '28 29 25  ' Calcium 8.9 - 10.3 mg/dL 8.6(L) 9.2 9.2   Hepatic Function Latest Ref Rng & Units 10/18/2016 10/16/2016 11/21/2014  Total Protein 6.5 - 8.1 g/dL 7.2 8.6(H) -  Albumin 3.5 - 5.0 g/dL 3.3(L) 4.2 -  AST 15 - 41 U/L 57(H) 79(H) 47(A)  ALT 17 - 63 U/L 28 42 34  Alk Phosphatase 38 - 126 U/L 54 94 93  Total Bilirubin 0.3 - 1.2 mg/dL 0.8 0.6 -     ASSESSMENT/PLAN:  56 y.o. male with  meningiomas vs alcohol withdrawal as etiology of seizures. Neurology following, on cEEG monitoring  1. New onset status epilepticus: Neurology following. Keppra, vimpat and fosphenytoin per neurology. Seizure precautions. On cEEG - Need to get EEG corelate of event at 3 am, If seizure, will change to propofol gtt 2. Acute hypoxic respiratory failure: For airway  protection in the setting of seizure. . Holding on extubation pending improvement in mental status & suppression of withdrawal/seizure. 3. Acute encephalopathy: duet to above Ct Precedex infusion. Give high-dose IV thiamine for 3 days. 4. Chronic alcohol use:  use ativan 2 mg PO q 8h Continuing daily thiamine and folic acid IV. 5. Hyperglycemia: No history of diabetes mellitus.SSI, HbA1c 5.2 6. Hypokalemia:  Replete as needed, Mg OK 7. Thrombocytopenia: ? Alcohol induced  Continuing to trend cell counts daily with CBC. 8. Essential hypertension: Holding home lisinopril & Lopressor. 9. Hyponatremia:  Likely beer potomania. Resolved  10. BPH: Foley catheter to remain in place. Holding home Rapaflo & Proscar. 11. Neuropathy: Holding Soma & gabapentin. 12. Tobacco use disorder: nicotine patch  13. Meningiomas:  Noted on MRI. Decadron per neuro , neurosurgery input once seizure/withdrawal resolved   Lactic acidosis: Likely secondary to seizure activity. resolved  Prophylaxis:  SCDs & Protonix  Diet:  NPO.  tube feedings.  Code Status: Full code Disposition:  Patient remains intubated & critically ill in the ICU.  Family Update:  Wife updated 3/1 , daughter Janett Billow 3/3   My cc time x 42m RKara MeadMD. FCCP. [facility] Pager [phone] no response call [phone]     8:06 AM 10/19/16

## 2016-10-19 NOTE — Progress Notes (Signed)
Elink notified of seizure event; told that suspicion was that lack of adequate sedation precipitated event and advised that current sedation parameters were not sufficient to keep patient sedated, synchronous and calm;  MD noted and will change sedation orders.  Will continue to monitor.  Clyda Hurdle RN

## 2016-10-19 NOTE — Progress Notes (Signed)
Patient became agitated and aggressive. Attempting to pull at ET tube, thrashing in bed, pulling on restraints. Unable to be redirected, not following commands. Pulled out 2 PIVs. PRN medication was administered prior to losing IVs. Multiple staff at bedside attempting to restrain patient. Dr. Elsworth Soho at bedside. Espisode lasted 15-20 minutes. Patient finally calmed down. New orders for PICC line insertion placed.

## 2016-10-19 NOTE — Progress Notes (Signed)
[facility] Progress Note Patient Name: Frederick Drake DOB: 1961/04/04 MRN: [id number]   Date of Service  10/19/2016  HPI/Events of Note  Nurse notified of steadily rising blood pressure. Patient currently on fentanyl drip. No signs of agitation or discomfort.   eICU Interventions  Labetalol 10 mg IV as needed for systolic blood pressure greater than 165 ordered      Intervention Category Intermediate Interventions: Hypertension - evaluation and management  Tera Partridge 10/19/2016, 10:48 PM

## 2016-10-20 ENCOUNTER — Inpatient Hospital Stay (HOSPITAL_COMMUNITY): Payer: Self-pay

## 2016-10-20 DIAGNOSIS — R569 Unspecified convulsions: Secondary | ICD-10-CM

## 2016-10-20 DIAGNOSIS — D329 Benign neoplasm of meninges, unspecified: Secondary | ICD-10-CM

## 2016-10-20 DIAGNOSIS — R4701 Aphasia: Secondary | ICD-10-CM

## 2016-10-20 DIAGNOSIS — G9340 Encephalopathy, unspecified: Secondary | ICD-10-CM

## 2016-10-20 DIAGNOSIS — J9600 Acute respiratory failure, unspecified whether with hypoxia or hypercapnia: Secondary | ICD-10-CM

## 2016-10-20 LAB — BASIC METABOLIC PANEL
Anion gap: 4 — ABNORMAL LOW (ref 5–15)
BUN: 12 mg/dL (ref 6–20)
CHLORIDE: 105 mmol/L (ref 101–111)
CO2: 28 mmol/L (ref 22–32)
CREATININE: 0.62 mg/dL (ref 0.61–1.24)
Calcium: 7.4 mg/dL — ABNORMAL LOW (ref 8.9–10.3)
GFR calc Af Amer: >60 mL/min (ref >60)
GFR calc non Af Amer: >60 mL/min (ref >60)
GLUCOSE: 157 mg/dL — AB (ref 65–99)
POTASSIUM: 3.2 mmol/L — AB (ref 3.5–5.1)
SODIUM: 137 mmol/L (ref 135–145)

## 2016-10-20 LAB — BLOOD GAS, VENOUS
Acid-base deficit: 16.5 mmol/L — ABNORMAL HIGH (ref 0.0–2.0)
Bicarbonate: 15.4 mmol/L — ABNORMAL LOW (ref 20.0–28.0)
FIO2: 0.36
O2 SAT: 87.4 %
Patient temperature: 37
pCO2, Ven: 61 mmHg — ABNORMAL HIGH (ref 44.0–60.0)
pH, Ven: 7.01 — CL (ref 7.250–7.430)
pO2, Ven: 80 mmHg — ABNORMAL HIGH (ref 32.0–45.0)

## 2016-10-20 LAB — GLUCOSE, CAPILLARY
GLUCOSE-CAPILLARY: 163 mg/dL — AB (ref 65–99)
GLUCOSE-CAPILLARY: 115 mg/dL — AB (ref 65–99)
GLUCOSE-CAPILLARY: 145 mg/dL — AB (ref 65–99)
GLUCOSE-CAPILLARY: 218 mg/dL — AB (ref 65–99)
Glucose-Capillary: 142 mg/dL — ABNORMAL HIGH (ref 65–99)
Glucose-Capillary: 183 mg/dL — ABNORMAL HIGH (ref 65–99)

## 2016-10-20 LAB — CBC
HCT: 39.4 % (ref 39.0–52.0)
HEMOGLOBIN: 12.7 g/dL — AB (ref 13.0–17.0)
MCH: 32.2 pg (ref 26.0–34.0)
MCHC: 32.2 g/dL (ref 30.0–36.0)
MCV: 100 fL (ref 78.0–100.0)
Platelets: 73 10*3/uL — ABNORMAL LOW (ref 150–400)
RBC: 3.94 MIL/uL — AB (ref 4.22–5.81)
RDW: 12.6 % (ref 11.5–15.5)
WBC: 9.2 10*3/uL (ref 4.0–10.5)

## 2016-10-20 LAB — PHOSPHORUS: Phosphorus: 3 mg/dL (ref 2.5–4.6)

## 2016-10-20 LAB — MAGNESIUM: MAGNESIUM: 1.8 mg/dL (ref 1.7–2.4)

## 2016-10-20 LAB — VITAMIN B12: Vitamin B-12: 147 pg/mL — ABNORMAL LOW (ref 180–914)

## 2016-10-20 MED ORDER — CYANOCOBALAMIN 1000 MCG/ML IJ SOLN
1000.0000 ug | Freq: Every day | INTRAMUSCULAR | Status: DC
  Administered 2016-10-20 – 2016-10-25 (×6): 1000 ug via INTRAMUSCULAR
  Filled 2016-10-20 (×7): qty 1

## 2016-10-20 MED ORDER — SODIUM CHLORIDE 0.9 % IV SOLN
INTRAVENOUS | Status: DC
  Administered 2016-10-21 – 2016-10-22 (×2): via INTRAVENOUS

## 2016-10-20 MED ORDER — SODIUM CHLORIDE 0.9 % IV SOLN
30.0000 meq | Freq: Once | INTRAVENOUS | Status: AC
  Administered 2016-10-20: 30 meq via INTRAVENOUS
  Filled 2016-10-20: qty 15

## 2016-10-20 NOTE — Progress Notes (Signed)
Patient used tongue to push out OG tube; stopped tube feeds; advanced OG tube back to prior position and ordered xray,  Lungs are still clear bilaterally; no respiratory distress and no tube feed on ett suction.  Will continue to monitor.  Clyda Hurdle RN.

## 2016-10-20 NOTE — Progress Notes (Signed)
   [facility]  Pt with no evidence of szes on eeg. Will start tapering off seizure meds in am. He easily gets agitated >> will need a quick wean in am.   [place], MD 10/20/2016, 6:22 PM [facility] Pager [phone] After 3 pm or if no answer, call [phone]

## 2016-10-20 NOTE — Procedures (Signed)
Continuous Video-EEG Monitoring Report  Patient: Frederick Drake, Frederick Drake     EEG No.ID: [id number]     DOB: January 27, 1961 Age: 56 Room#2S04  MED REC NO: [id number] Gender: Male   TECH: Estil Daft     Physician: Winfield Cunas     Referring Physician: Roland Rack     Report Date: 10/20/2016       Study Duration:10/19/2016 07:30 to 10/20/2016 07:30 CPT [id number] Diagnosis:Status epilepticus (G40.901)  History:This is a 56 year old male presenting with status epilepticus. Video-EEG monitoring was performed to evaluate for seizures.  Technical Details:Long-term video-EEG monitoring was performed using standard setting per the guidelines. Briefly, a minimum of 21 electrodes were placed on scalp according to the International 10-20 or/and 10-10 Systems. Supplemental electrodes were placed as needed. Single EKG electrode was also used to detect cardiac arrhythmia. Patient's behavior was continuously recorded on video simultaneously with EEG. A minimum of 16 channels were used for data display. Each epoch of study was reviewed manually daily and as needed using standard referential and bipolar montages.   EEG Description: There was generalized polymorphic delta slowing. No posterior dominant rhythm or sleep architecture was present. No epileptiform discharges or seizures were in evidence.   Impression: This is an abnormal EEGdue to the presence of generalized polymorphic delta slowing,suggestingsevere encephalopathy.  No epileptiform discharges or seizures were present.  Compared with the last epoch, current epoch showed no significant change.    Reading Physician:Winfield Cunas, MD, PhD

## 2016-10-20 NOTE — Progress Notes (Signed)
Patient ID: Frederick Drake, male   DOB: May 02, 1961, 56 y.o.   MRN: [id number]  BP (!) 170/85   Pulse (!) 55   Temp (!) 96.5 F (35.8 C) (Axillary)   Resp 14   Ht 6\' 2"  (1.88 m)   Wt 106 kg (233 lb 11 oz)   SpO2 100%   BMI 30.00 kg/m  Remains intubated. On occasion today has been noted following commands Will await extubation, and time before pursuing craniotomy for left parietal meningioma.  No new recs will follow

## 2016-10-20 NOTE — Progress Notes (Signed)
Patient ID: Frederick Drake, male   DOB: 01/31/1961, 56 y.o.   MRN: [id number]  Arouses easily to voice, following commands Moving all extremities Pupils equal round reactive to light. Improved neurologically

## 2016-10-20 NOTE — Progress Notes (Signed)
Neurology Progress Note  Subjective: This afternoon, his wife was at bedside. He was able to follow commands. She recalls him having a seizure in November though he refused to seek medical help at the time.  Medications reviewed and reconciled.   Pertinent meds: Phenytoin Levetiracetam Lacosamide   Current Meds:   Current Facility-Administered Medications:  .  0.9 %  sodium chloride infusion, , Intravenous, Continuous, Jose Angelo A Corrie Dandy, MD, Last Rate: 10 mL/hr at 10/20/16 1500 .  acetaminophen (TYLENOL) solution 650 mg, 650 mg, Oral, Q6H PRN **OR** acetaminophen (TYLENOL) suppository 650 mg, 650 mg, Rectal, Q4H PRN, Chesley Mires, MD .  chlorhexidine gluconate (MEDLINE KIT) (PERIDEX) 0.12 % solution 15 mL, 15 mL, Mouth Rinse, BID, Chesley Mires, MD, 15 mL at 10/20/16 0730 .  Chlorhexidine Gluconate Cloth 2 % PADS 6 each, 6 each, Topical, Daily, Chesley Mires, MD, 6 each at 10/20/16 [phone] .  dexamethasone (DECADRON) injection 6 mg, 6 mg, Intravenous, Q6H, Greta Doom, MD, 6 mg at 10/20/16 1101 .  dexmedetomidine (PRECEDEX) 400 MCG/100ML (4 mcg/mL) infusion, 0.4-1.2 mcg/kg/hr, Intravenous, Titrated, Juanito Doom, MD, Last Rate: 31.4 mL/hr at 10/20/16 1500, 1.2 mcg/kg/hr at 10/20/16 1500 .  feeding supplement (VITAL AF 1.2 CAL) liquid 1,000 mL, 1,000 mL, Per Tube, Continuous, Chesley Mires, MD, Last Rate: 80 mL/hr at 10/20/16 1500, 1,000 mL at 10/20/16 1500 .  fentaNYL (SUBLIMAZE) injection 100 mcg, 100 mcg, Intravenous, Q2H PRN, Chesley Mires, MD, 100 mcg at 10/19/16 1551 .  fentaNYL 2551mg in NS 2544m(1075mml) infusion-PREMIX, 25-400 mcg/hr, Intravenous, Continuous, VinChesley MiresD, Last Rate: 27.5 mL/hr at 10/20/16 1500, 275 mcg/hr at 10/20/16 1500 .  folic acid injection 1 mg, 1 mg, Intravenous, Daily, JenJavier GlazierD, 1 mg at 10/20/16 0910 .  insulin aspart (novoLOG) injection 2-6 Units, 2-6 Units, Subcutaneous, Q4H, PauCorey HaroldP, 2 Units at 10/20/16 1232 .   labetalol (NORMODYNE,TRANDATE) injection 10 mg, 10 mg, Intravenous, Q2H PRN, JenJavier GlazierD, 10 mg at 10/20/16 1008 .  lacosamide (VIMPAT) 200 mg in sodium chloride 0.9 % 25 mL IVPB, 200 mg, Intravenous, Q12H, McNGreta DoomD, 200 mg at 10/20/16 0958 .  levETIRAcetam (KEPPRA) 1,500 mg in sodium chloride 0.9 % 100 mL IVPB, 1,500 mg, Intravenous, Q12H, McNGreta DoomD, 1,500 mg at 10/20/16 0815 .  LORazepam (ATIVAN) 2 MG/ML concentrated solution 2 mg, 2 mg, Per Tube, TID, RakKara Mead MD, 2 mg at 10/20/16 1505 .  LORazepam (ATIVAN) injection 2 mg, 2 mg, Intravenous, PRN, VinChesley MiresD, 2 mg at 10/19/16 0251 .  MEDLINE mouth rinse, 15 mL, Mouth Rinse, 10 times per day, DouJuanito DoomD, 15 mL at 10/20/16 1507 .  midazolam (VERSED) injection 2-4 mg, 2-4 mg, Intravenous, Q2H PRN, EliColbert CoyerD, 2 mg at 10/20/16 1325 .  nicotine (NICODERM CQ - dosed in mg/24 hours) patch 14 mg, 14 mg, Transdermal, Daily, JenJavier GlazierD, 14 mg at 10/20/16 0909 .  pantoprazole sodium (PROTONIX) 40 mg/20 mL oral suspension 40 mg, 40 mg, Per Tube, QHS, VinChesley MiresD, 40 mg at 10/19/16 2113 .  phenytoin (DILANTIN) injection 100 mg, 100 mg, Intravenous, Q8H, McNGreta DoomD, 100 mg at 10/20/16 1350 .  sodium chloride flush (NS) 0.9 % injection 10-40 mL, 10-40 mL, Intracatheter, Q12H, VinChesley MiresD, 10 mL at 10/20/16 0911 .  sodium chloride flush (NS) 0.9 % injection 10-40 mL, 10-40 mL, Intracatheter, PRN, VinChesley MiresD .  thiamine (B-1) injection 100 mg, 100 mg, Intravenous, Daily, Javier Glazier, MD, 100 mg at 10/20/16 0909  Objective:  Temp:  [97.2 F (36.2 C)-99.9 F (37.7 C)] 97.2 F (36.2 C) (03/05 1229) Pulse Rate:  [54-67] 67 (03/05 1500) Resp:  [14-19] 18 (03/05 1500) BP: (130-174)/(60-90) 155/60 (03/05 1500) SpO2:  [95 %-100 %] 98 % (03/05 1500) FiO2 (%):  [30 %] 30 % (03/05 1126) Weight:  [233 lb 11 oz (106 kg)] 233 lb 11 oz (106 kg)  (03/05 0500)  General: Middle aged white man. Intubated. No acute distress.  HEENT: Neck is supple without lymphadenopathy. Mucous membranes are moist and the oropharynx is clear. Sclerae are anicteric. There is no conjunctival injection. Dysconjugate gaze.  CV: Regular, no murmur.  Lungs: CTAB in thea anterior lung fields. Extremities: No C/C/E. Neuro: MS: As noted above.  CN: Pupils are equal and reactive from 3-->2 mm bilaterally. EOMI, no nystagmus.Face is symmetric at rest with normal strength and mobility. Hearing is intact to conversational voice. Bilateral SCM and trapezii are 5/5.  Motor: Normal bulk, tone, and strength throughout. Wiggles toes and lifts upper extremities on command. Sensation: Responds to tactile stimulation.   DTRs: 3+, symmetric. Toes are downgoing bilaterally.  Coordination: Deferred.  Gait: Deferred.   Labs: Lab Results  Component Value Date   WBC 9.2 10/20/2016   HGB 12.7 (L) 10/20/2016   HCT 39.4 10/20/2016   PLT 73 (L) 10/20/2016   GLUCOSE 157 (H) 10/20/2016   CHOL 202 (A) 11/21/2014   TRIG 60 11/21/2014   HDL 104 (A) 11/21/2014   LDLCALC 86 11/21/2014   ALT 28 10/18/2016   AST 57 (H) 10/18/2016   NA 137 10/20/2016   K 3.2 (L) 10/20/2016   CL 105 10/20/2016   CREATININE 0.62 10/20/2016   BUN 12 10/20/2016   CO2 28 10/20/2016   TSH 5.30 12/28/2014   PSA 0.2 11/21/2014   HGBA1C 5.2 10/17/2016   CBC Latest Ref Rng & Units 10/20/2016 10/19/2016 10/18/2016  WBC 4.0 - 10.5 K/uL 9.2 14.0(H) 8.7  Hemoglobin 13.0 - 17.0 g/dL 12.7(L) 14.3 14.9  Hematocrit 39.0 - 52.0 % 39.4 43.3 44.1  Platelets 150 - 400 K/uL 73(L) 91(L) 104(L)    Lab Results  Component Value Date   HGBA1C 5.2 10/17/2016   Lab Results  Component Value Date   ALT 28 10/18/2016   AST 57 (H) 10/18/2016   ALKPHOS 54 10/18/2016   BILITOT 0.8 10/18/2016    A/P:  Frederick Drake is a 56 year old man with alcohol use disorder and tobacco use disorder hospitalized for seizure activity 2/2  multiple brain masses found to have vitamin B12 deficiency.  Seizure activity: Currently on three agents, of which Keppra is known to cause behavioral disturbances in those with risk factors which he does given the location of one of his brain masses. EEG without additional epileptiform activity. Unclear if he is at risk for alcohol withdrawal as how much he was consuming daily is unknown. -Consider titrating off Keppra and continuing lacosamide and phenytoin.  Vitamin B12 deficiency: Low at 147 and may be contributory to cognitive issues. Start IM supplementation.  The patient would likely benefit from acute rehab services. Recommend consultation of PT/OT/SLP with consideration for PM&R consult as appropriate depending upon clinical progress and therapists' recommendations.   Fall risk: Yes. Risk factors for falls include age, prior history of falls, need for assistance with ADLs at baseline, use of psychotropic/sedative/hypnotic medications, polypharmacy, baseline cognitive dysfunction, male gender,  impaired mobility/gait, visual impairments, and orthopedic issues. Patient educated on risk of falls and use of call bell. Patient was counseled not to get up without assistance. Limit psychoactive medications and sedating medications.  Delirium risk: Yes. Risk factors for delirium include history of dementia/cognitive impairment, age, co-morbid illness, severity of medical illness, infection, medications/polypharmacy, impaired ADLs at baseline, immobility, sensory impairment (decreased vision, decreased hearing), urinary catheterization, metabolic derangement, decreased albumin, ICU admission, length of hospital stay. Continue to optimize metabolic status as you are. Continue to treat any underlying infection. Minimize the use of opiates, benzos or any medication with strong anticholinergic properties as much as possible. Optimize sleep-wake cycles as much as you can by keeping the room bright with  activity during the day and dark and quiet at night.   Needs outpatient neurology follow-up?: Yes.  This was discussed with the patient and her wife. Education was provided on the diagnosis and expected evaluation and treatment. They are in agreement with the plan as noted. They were given the opportunity to ask any questions and these were addressed to their satisfaction.   Frederick Coon, MD [facility]

## 2016-10-20 NOTE — Progress Notes (Signed)
[facility] ADULT ICU REPLACEMENT PROTOCOL FOR AM LAB REPLACEMENT ONLY  The patient does apply for the [facility] Adult ICU Electrolyte Replacment Protocol based on the criteria listed below:   1. Is GFR >/= 40 ml/min? Yes.    Patient's GFR today is >60 2. Is urine output >/= 0.5 ml/kg/hr for the last 6 hours? Yes.   Patient's UOP is 0.9 ml/kg/hr 3. Is BUN < 60 mg/dL? Yes.    Patient's BUN today is 12 4. Abnormal electrolyte(s): k 3.2 5. Ordered repletion with: protocol 6. If a panic level lab has been reported, has the CCM MD in charge been notified? No..   Physician:    Ronda Fairly A 10/20/2016 5:24 AM

## 2016-10-20 NOTE — Care Management Note (Signed)
Case Management Note  Patient Details  Name: Frederick Drake MRN: [id number] Date of Birth: April 10, 1961  Subjective/Objective:    Pt lives with wife and daughter who will be available for 24/7 assistance.  Per wife, pt was working sporadically in [employer] and was not insured.  Pt will eventually need surgery when medically stable - currently remains on vent.                         Expected Discharge Plan:  Skilled Nursing Facility  In-House Referral:  Clinical Social Work  Discharge planning Services  CM Consult  Status of Service:  In process, will continue to follow  Berton Mount, RN 10/20/2016, 9:38 AM

## 2016-10-20 NOTE — Progress Notes (Signed)
[facility] Attending Note  Presenting HPI:  56 y.o. male taken to [facility] ED 10/16/16 with seizure like activity at home.  Per his wife, he had 2 seizures earlier that day described as generalized tonic clonic.  On EMS arrival, he had a 3rd seizure and never returned to baseline as far as mental status was concerned. On arrival to ED, he was subsequently intubated for airway protection in the setting of status epilepticus.  CT of the head was then obtained and demonstrated 2.3cm mass in right frontal lobe with surrounding white matter edema along with 2.7 x 2.6 x 2.0cm mass in left parietal cortex above left tentorium without surrounding edema. He was then transferred to [facility] for further evaluation by neurosurgery. Of note, he has hx of seizure disorder but reportedly has not been on AED's since 2016.  Also has hx of chronic EtOH abuse - drinks 12 pack every day.  IMAGING/STUDIES: MRI BRAIN W/O 3/2:  3.2 x 5.6 x 4.8 cm LEFT temporal occipital meningioma with local invasion including LEFT sigmoid dural venous sinus invasion with low flow versus thrombosis. 3.2 x 3.4 x 1.4 cm RIGHT anterior cranial fossa meningioma with extensive vasogenic edema, potential parenchymal invasion. Abnormal signal LEFT hippocampus and to lesser extent cingulate gyrus compatible recent seizures/postictal state. No suspicious signal to suggest superimposed to suggest viral infection.  3/3 cEEG 4p no seizure last 24h  MICROBIOLOGY: MRSA PCR 3/2:  Negative   ANTIBIOTICS: None.   SIGNIFICANT EVENTS: 03/01 - Admit 3/2 cEEG monitoring  Subjective:   Questionable seizure episodes.  On cEEG. Get really agitated every now and then and appropriate. Tongued NGT out   Vent Mode: PRVC FiO2 (%):  [30 %] 30 % Set Rate:  [14 bmp] 14 bmp Vt Set:  [570 mL] 570 mL PEEP:  [5 cmH20] 5 cmH20 Plateau Pressure:  [14 cmH20-18 cmH20] 14 cmH20  Temp:  [98.7 F (37.1 C)-99.9 F (37.7 C)] 98.7 F (37.1 C) (03/05  0755) Pulse Rate:  [54-65] 55 (03/05 0800) Resp:  [14-19] 14 (03/05 0800) BP: (130-167)/(67-84) 162/79 (03/05 0800) SpO2:  [95 %-100 %] 96 % (03/05 0800) FiO2 (%):  [30 %] 30 % (03/05 0722) Weight:  [106 kg (233 lb 11 oz)] 106 kg (233 lb 11 oz) (03/05 0500)  General:  Acutely ill  Intubated. Sedated. No distress. Integument:  Warm & dry. No rash  HEENT:  No scleral icterus. ETT in place. Moist mucus membranes. Neurological:  Pupils symmetric.No spontaneous movements. Pupils symmetric & reactive. CN grossly intact.  Musculoskeletal:  No joint effusion or erythema appreciated. Symmetric muscle bulk. Pulmonary:  Symmetric chest wall rise on ventilator. Clear breath sounds bilaterally. Cardiovascular: brady rate & rhythm. No appreciable JVD. Normal S1 & S2. Telemetry:  Sinus rhythm. Abdomen:  Soft. Nondistended. Normoactive bowel sounds.  LINES/TUBES: OETT 3/1 >>> Foley 3/1 >>> OGT 3/1 >>> PIV  CBC Latest Ref Rng & Units 10/20/2016 10/19/2016 10/18/2016  WBC 4.0 - 10.5 K/uL 9.2 14.0(H) 8.7  Hemoglobin 13.0 - 17.0 g/dL 12.7(L) 14.3 14.9  Hematocrit 39.0 - 52.0 % 39.4 43.3 44.1  Platelets 150 - 400 K/uL 73(L) 91(L) 104(L)    BMP Latest Ref Rng & Units 10/20/2016 10/19/2016 10/18/2016  Glucose 65 - 99 mg/dL 157(H) 134(H) 168(H)  BUN 6 - 20 mg/dL '12 16 12  ' Creatinine 0.61 - 1.24 mg/dL 0.62 0.76 0.76  Sodium 135 - 145 mmol/L 137 138 136  Potassium 3.5 - 5.1 mmol/L 3.2(L) 3.6 3.7  Chloride  101 - 111 mmol/L 105 102 95(L)  CO2 22 - 32 mmol/L '28 28 29  ' Calcium 8.9 - 10.3 mg/dL 7.4(L) 8.6(L) 9.2   Hepatic Function Latest Ref Rng & Units 10/18/2016 10/16/2016 11/21/2014  Total Protein 6.5 - 8.1 g/dL 7.2 8.6(H) -  Albumin 3.5 - 5.0 g/dL 3.3(L) 4.2 -  AST 15 - 41 U/L 57(H) 79(H) 47(A)  ALT 17 - 63 U/L 28 42 34  Alk Phosphatase 38 - 126 U/L 54 94 93  Total Bilirubin 0.3 - 1.2 mg/dL 0.8 0.6 -     ASSESSMENT/PLAN:  56 y.o. male with meningiomas vs alcohol withdrawal as etiology of seizures. Neurology  following, on cEEG monitoring  1. New onset status epilepticus possible form brain masses vs ETOH withdrawal: Neurology following. Keppra, vimpat and fosphenytoin per neurology. Also on scheduled ativan and lorazepam IV pushes prn. Seizure precautions. On cEEG.  Need to determine if patient continues to have seizures or not.  He gets episodes  of "seizures" or agitation. If no seizures, will consider weaning and extubating.  Neurosurgery has been consulted >> holding off on surgery.  Appreciate neuro and neurosurgery recs.  Cont decadron for brain swelling.  2. Acute hypoxic respiratory failure: For airway  protection in the setting of seizure. . Holding on extubation pending improvement in mental status & suppression of withdrawal/seizure. Will need quick wean and extubation as he gest easily agitated.  3. Acute encephalopathy: duet to above Ct Precedex infusion. Cont daily thiamine.  4. Chronic alcohol use:  Scheduled benzos as well as prn. Continuing daily thiamine and folic acid IV. 5. Hyperglycemia: No history of diabetes mellitus.SSI 6. Hypokalemia:  Repleted this am.  7. Thrombocytopenia: ? Alcohol induced  Continuing to trend cell counts daily with CBC. 8. Essential hypertension: Holding home lisinopril & Lopressor. 9. Hyponatremia:  Likely beer potomania. Resolved  10. BPH: Foley catheter to remain in place. Holding home Rapaflo & Proscar. 11. Neuropathy: Holding Soma & gabapentin. 12. Tobacco use disorder: nicotine patch  13. Meningiomas:  Noted on MRI. Decadron per neuro , neurosurgery input appreciated.  Holding off on surgery for now.    Prophylaxis:  SCDs & Protonix  Diet:  Cont tube feedings.  Code Status: Full code Disposition:  Patient remains intubated & critically ill in the ICU.  Family Update:  Wife updated 3/5, daughter Janett Billow 64/5    I spent  30 minutes of Critical Care time with this patient today.   Monica Becton, MD 10/20/2016, 9:11 AM [facility] Pager [phone] After 3 pm or if no answer, call [phone]     9:00 AM 10/20/16

## 2016-10-21 DIAGNOSIS — G9389 Other specified disorders of brain: Secondary | ICD-10-CM

## 2016-10-21 LAB — CBC
HCT: 41.3 % (ref 39.0–52.0)
Hemoglobin: 13.6 g/dL (ref 13.0–17.0)
MCH: 32.6 pg (ref 26.0–34.0)
MCHC: 32.9 g/dL (ref 30.0–36.0)
MCV: 99 fL (ref 78.0–100.0)
Platelets: 83 10*3/uL — ABNORMAL LOW (ref 150–400)
RBC: 4.17 MIL/uL — AB (ref 4.22–5.81)
RDW: 12.6 % (ref 11.5–15.5)
WBC: 8.1 10*3/uL (ref 4.0–10.5)

## 2016-10-21 LAB — GLUCOSE, CAPILLARY
GLUCOSE-CAPILLARY: 135 mg/dL — AB (ref 65–99)
GLUCOSE-CAPILLARY: 143 mg/dL — AB (ref 65–99)
GLUCOSE-CAPILLARY: 148 mg/dL — AB (ref 65–99)
GLUCOSE-CAPILLARY: 198 mg/dL — AB (ref 65–99)
Glucose-Capillary: 166 mg/dL — ABNORMAL HIGH (ref 65–99)
Glucose-Capillary: 131 mg/dL — ABNORMAL HIGH (ref 65–99)

## 2016-10-21 LAB — BASIC METABOLIC PANEL
ANION GAP: 7 (ref 5–15)
BUN: 11 mg/dL (ref 6–20)
CALCIUM: 8.1 mg/dL — AB (ref 8.9–10.3)
CHLORIDE: 100 mmol/L — AB (ref 101–111)
CO2: 29 mmol/L (ref 22–32)
CREATININE: 0.54 mg/dL — AB (ref 0.61–1.24)
GFR calc non Af Amer: >60 mL/min (ref >60)
GLUCOSE: 150 mg/dL — AB (ref 65–99)
Potassium: 3.3 mmol/L — ABNORMAL LOW (ref 3.5–5.1)
Sodium: 136 mmol/L (ref 135–145)

## 2016-10-21 LAB — MAGNESIUM: Magnesium: 1.7 mg/dL (ref 1.7–2.4)

## 2016-10-21 LAB — PHOSPHORUS: Phosphorus: 3.2 mg/dL (ref 2.5–4.6)

## 2016-10-21 MED ORDER — MAGNESIUM SULFATE 2 GM/50ML IV SOLN
2.0000 g | Freq: Once | INTRAVENOUS | Status: AC
  Administered 2016-10-21: 2 g via INTRAVENOUS
  Filled 2016-10-21: qty 50

## 2016-10-21 MED ORDER — ORAL CARE MOUTH RINSE: 15.0000 mL | Freq: Two times a day (BID) | OROMUCOSAL | Status: DC

## 2016-10-21 MED ORDER — ORAL CARE MOUTH RINSE
15.0000 mL | Freq: Four times a day (QID) | OROMUCOSAL | Status: DC
  Administered 2016-10-21: 15 mL via OROMUCOSAL

## 2016-10-21 MED ORDER — POTASSIUM CHLORIDE 20 MEQ/15ML (10%) PO SOLN
20.0000 meq | ORAL | Status: AC
  Administered 2016-10-21: 20 meq
  Filled 2016-10-21 (×2): qty 15

## 2016-10-21 MED ORDER — ORAL CARE MOUTH RINSE
15.0000 mL | Freq: Two times a day (BID) | OROMUCOSAL | Status: DC
  Administered 2016-10-21 – 2016-10-24 (×6): 15 mL via OROMUCOSAL

## 2016-10-21 MED ORDER — LORAZEPAM 2 MG/ML IJ SOLN
1.0000 mg | Freq: Two times a day (BID) | INTRAMUSCULAR | Status: DC
  Administered 2016-10-21 (×2): 1 mg via INTRAVENOUS
  Filled 2016-10-21 (×2): qty 1

## 2016-10-21 MED ORDER — SODIUM CHLORIDE 0.9 % IV SOLN
100.0000 mg | Freq: Two times a day (BID) | INTRAVENOUS | Status: AC
  Administered 2016-10-21 – 2016-10-22 (×3): 100 mg via INTRAVENOUS
  Filled 2016-10-21 (×4): qty 10

## 2016-10-21 NOTE — Progress Notes (Signed)
Patient ID: Frederick Drake, male   DOB: 06/29/1961, 56 y.o.   MRN: [id number] BP (!) 150/77   Pulse (!) 55   Temp (!) 96.4 F (35.8 C) (Oral)   Resp 15   Ht 6\' 2"  (1.88 m)   Wt 107.8 kg (237 lb 10.5 oz)   SpO2 99%   BMI 30.51 kg/m  Extubated, follows commands Would plan for early next week for craniotomy. If it is thought he would be discharged before then please contact me.

## 2016-10-21 NOTE — Procedures (Signed)
Extubation Procedure Note  Patient Details:   Name: Frederick Drake DOB: 1961/06/16 MRN: [id number]   Airway Documentation:     Evaluation  O2 sats: stable throughout Complications: No apparent complications Patient did tolerate procedure well. Bilateral Breath Sounds: Clear   Yes  Patient tolerated wean. MD ordered to extubate. Positive for cuff leak. Patient extubated to a 2 Lpm nasal cannula. No signs of dyspnea or stridor noted. Patient resting comfortably. Family and RN at bedside.   Myrtie Neither 10/21/2016, 9:31 AM

## 2016-10-21 NOTE — Plan of Care (Signed)
Wasted 230 cc fentanyl in sink witnessed by Warden Fillers RN

## 2016-10-21 NOTE — Progress Notes (Signed)
Neurology Progress Note  Subjective: This morning, his wife and his daughter were at bedside. They feel he is doing better compared to yesterday. He recently had his safety gloves removed. They acknowledge he drinks 12 pack daily though has never had a history of withdrawal in the past.   Medications reviewed and reconciled.   Pertinent meds: Lacosamide  Phenytoin Dexamethasone Vitamin B12  Current Meds:   Current Facility-Administered Medications:  .  0.9 %  sodium chloride infusion, , Intravenous, Continuous, Jose Angelo A Corrie Dandy, MD, Last Rate: 10 mL/hr at 10/21/16 1500 .  acetaminophen (TYLENOL) solution 650 mg, 650 mg, Oral, Q6H PRN **OR** acetaminophen (TYLENOL) suppository 650 mg, 650 mg, Rectal, Q4H PRN, Chesley Mires, MD .  Chlorhexidine Gluconate Cloth 2 % PADS 6 each, 6 each, Topical, Daily, Chesley Mires, MD, 6 each at 10/21/16 0500 .  cyanocobalamin ((VITAMIN B-12)) injection 1,000 mcg, 1,000 mcg, Intramuscular, Daily, Krishawn Vanderweele Sherrye Payor, MD, 1,000 mcg at 10/21/16 0901 .  dexamethasone (DECADRON) injection 6 mg, 6 mg, Intravenous, Q6H, Greta Doom, MD, 6 mg at 10/21/16 1125 .  dexmedetomidine (PRECEDEX) 400 MCG/100ML (4 mcg/mL) infusion, 0.4-1.2 mcg/kg/hr, Intravenous, Titrated, Juanito Doom, MD, Last Rate: 31.4 mL/hr at 10/21/16 1500, 1.2 mcg/kg/hr at 10/21/16 1500 .  fentaNYL (SUBLIMAZE) injection 100 mcg, 100 mcg, Intravenous, Q2H PRN, Chesley Mires, MD, 100 mcg at 10/19/16 1551 .  fentaNYL 2569mcg in NS 236mL (22mcg/ml) infusion-PREMIX, 25-400 mcg/hr, Intravenous, Continuous, Chesley Mires, MD, Stopped at 10/21/16 [phone]  folic acid injection 1 mg, 1 mg, Intravenous, Daily, Javier Glazier, MD, 1 mg at 10/21/16 0910 .  insulin aspart (novoLOG) injection 2-6 Units, 2-6 Units, Subcutaneous, Q4H, Corey Harold, NP, 4 Units at 10/21/16 1203 .  labetalol (NORMODYNE,TRANDATE) injection 10 mg, 10 mg, Intravenous, Q2H PRN, Javier Glazier, MD, 10 mg at 10/21/16 0241 .   lacosamide (VIMPAT) 200 mg in sodium chloride 0.9 % 25 mL IVPB, 200 mg, Intravenous, Q12H, Greta Doom, MD, 200 mg at 10/21/16 0906 .  LORazepam (ATIVAN) injection 1 mg, 1 mg, Intravenous, BID, Jose Shirl Harris, MD, 1 mg at 10/21/16 0910 .  LORazepam (ATIVAN) injection 2 mg, 2 mg, Intravenous, PRN, Chesley Mires, MD, 2 mg at 10/19/16 0251 .  MEDLINE mouth rinse, 15 mL, Mouth Rinse, QID, Jose Angelo A de [place], MD .  nicotine (NICODERM CQ - dosed in mg/24 hours) patch 14 mg, 14 mg, Transdermal, Daily, Javier Glazier, MD, 14 mg at 10/21/16 0858 .  pantoprazole sodium (PROTONIX) 40 mg/20 mL oral suspension 40 mg, 40 mg, Per Tube, QHS, Chesley Mires, MD, 40 mg at 10/20/16 2212 .  phenytoin (DILANTIN) injection 100 mg, 100 mg, Intravenous, Q8H, Greta Doom, MD, 100 mg at 10/21/16 1252 .  sodium chloride flush (NS) 0.9 % injection 10-40 mL, 10-40 mL, Intracatheter, Q12H, Chesley Mires, MD, 10 mL at 10/21/16 0906 .  sodium chloride flush (NS) 0.9 % injection 10-40 mL, 10-40 mL, Intracatheter, PRN, Chesley Mires, MD .  thiamine (B-1) injection 100 mg, 100 mg, Intravenous, Daily, Javier Glazier, MD, 100 mg at 10/21/16 0901  Objective:  Temp:  [97.6 F (36.4 C)-99.4 F (37.4 C)] 97.9 F (36.6 C) (03/06 1200) Pulse Rate:  [55-108] 62 (03/06 1500) Resp:  [13-23] 15 (03/06 1500) BP: (128-183)/(53-99) 143/76 (03/06 1500) SpO2:  [96 %-100 %] 100 % (03/06 1500) FiO2 (%):  [30 %] 30 % (03/06 0820) Weight:  [237 lb 10.5 oz (107.8 kg)] 237 lb 10.5  oz (107.8 kg) (03/06 0500)  General:  Middle aged man in no acute distress. Alert, oriented to place and year. Speech is difficult to completely understand. Affect is sluggish though able to follow most commands. HEENT: Neck is supple without lymphadenopathy. Mucous membranes are moist and the oropharynx is clear. Sclerae are anicteric. There is no conjunctival injection.  CV: Regular, no murmur. Distal pulses 2+ and symmetric.  Lungs: CTAB   Extremities: No C/C/E. Neuro: MS: As noted above.  CN: Pupils are equal and reactive from 3-->2 mm bilaterally. EOMI, nystagmus. Facial sensation is intact to light touch. Face is symmetric at rest with normal strength and mobility. Hearing is intact to conversational voice. Voice is normal in tone and quality. Palate elevates symmetrically. Uvula is midline. Bilateral SCM and trapezii are 5/5. Tongue is midline with normal bulk and mobility.  Motor: Normal bulk, tone, and strength throughout. No pronator drift. No tremor or other abnormal movements are observed.  Sensation: Intact to light touch, pinprick, vibration, and joint position.  DTRs: 3+, symmetric. Toes are equivocal bilaterally. No pathological reflexes.  Coordination: Unable to perform finger-to-nose though rapid alternating movements intact.  Gait: Deferred.   Labs: Lab Results  Component Value Date   WBC 8.1 10/21/2016   HGB 13.6 10/21/2016   HCT 41.3 10/21/2016   PLT 83 (L) 10/21/2016   GLUCOSE 150 (H) 10/21/2016   CHOL 202 (A) 11/21/2014   TRIG 60 11/21/2014   HDL 104 (A) 11/21/2014   LDLCALC 86 11/21/2014   ALT 28 10/18/2016   AST 57 (H) 10/18/2016   NA 136 10/21/2016   K 3.3 (L) 10/21/2016   CL 100 (L) 10/21/2016   CREATININE 0.54 (L) 10/21/2016   BUN 11 10/21/2016   CO2 29 10/21/2016   TSH 5.30 12/28/2014   PSA 0.2 11/21/2014   HGBA1C 5.2 10/17/2016   CBC Latest Ref Rng & Units 10/21/2016 10/20/2016 10/19/2016  WBC 4.0 - 10.5 K/uL 8.1 9.2 14.0(H)  Hemoglobin 13.0 - 17.0 g/dL 13.6 12.7(L) 14.3  Hematocrit 39.0 - 52.0 % 41.3 39.4 43.3  Platelets 150 - 400 K/uL 83(L) 73(L) 91(L)    Lab Results  Component Value Date   HGBA1C 5.2 10/17/2016   Lab Results  Component Value Date   ALT 28 10/18/2016   AST 57 (H) 10/18/2016   ALKPHOS 54 10/18/2016   BILITOT 0.8 10/18/2016   A/P:  Frederick Drake is a 56 year old man hospitalized for seizures in the setting of alcohol use disorder and intracranial tumors found to  have vitamin B12 deficiency.   Seizures: Continue phenytoin and lacosamide for now. He is on maximum dose of lacosamide and wonder if we may be able to taper off phenytoin as he is on a low dose but will continue to monitor for now. No breakthrough seizures off Keppra which is reassuring.   Vitamin B12 deficiency: Continue IM supplementation.   The patient would likely benefit from acute rehab services. Recommend consultation of PT/OT/SLP with consideration for PM&R consult as appropriate depending upon clinical progress and therapists' recommendations.   Delirium risk: Risk factors for delirium include history of dementia/cognitive impairment, age, co-morbid illness, severity of medical illness, medications/polypharmacy, impaired ADLs at baseline, immobility, sensory impairment (decreased vision, decreased hearing), urinary catheterization, metabolic derangement, decreased albumin, ICU admission, length of hospital stay. Continue to optimize metabolic status as you are. Continue to treat any underlying infection. Minimize the use of opiates, benzos or any medication with strong anticholinergic properties as much as possible. Optimize  sleep-wake cycles as much as you can by keeping the room bright with activity during the day and dark and quiet at night.   Needs outpatient neurology follow-up?: Yes  This was discussed with the patient and his family. Education was provided on the diagnosis and expected evaluation and treatment. They are in agreement with the plan as noted. They were given the opportunity to ask any questions and these were addressed to their satisfaction.   Frederick Drake, PGY3 Internal Medicine Pager: [phone]

## 2016-10-21 NOTE — Progress Notes (Signed)
[place] [facility] Attending Note  Presenting HPI:  56 y.o. male taken to [facility] ED 10/16/16 with seizure like activity at home.  Per his wife, he had 2 seizures earlier that day described as generalized tonic clonic.  On EMS arrival, he had a 3rd seizure and never returned to baseline as far as mental status was concerned. On arrival to ED, he was subsequently intubated for airway protection in the setting of status epilepticus.  CT of the head was then obtained and demonstrated 2.3cm mass in right frontal lobe with surrounding white matter edema along with 2.7 x 2.6 x 2.0cm mass in left parietal cortex above left tentorium without surrounding edema. He was then transferred to [facility] for further evaluation by neurosurgery. Of note, he has hx of seizure disorder but reportedly has not been on AED's since 2016.  Also has hx of chronic EtOH abuse - drinks 12 pack every day.  IMAGING/STUDIES: MRI BRAIN W/O 3/2:  3.2 x 5.6 x 4.8 cm LEFT temporal occipital meningioma with local invasion including LEFT sigmoid dural venous sinus invasion with low flow versus thrombosis. 3.2 x 3.4 x 1.4 cm RIGHT anterior cranial fossa meningioma with extensive vasogenic edema, potential parenchymal invasion. Abnormal signal LEFT hippocampus and to lesser extent cingulate gyrus compatible recent seizures/postictal state. No suspicious signal to suggest superimposed to suggest viral infection.  3/3 cEEG 4p no seizure last 24h  MICROBIOLOGY: MRSA PCR 3/2:  Negative   ANTIBIOTICS: None.   SIGNIFICANT EVENTS: 03/01 - Admit 3/2 cEEG monitoring  Subjective:   No issues overnight.  Did well on PST this am. Follows commands, good tidal volume, good cough.  (-) szes on EEG per neuro    Vent Mode: PRVC FiO2 (%):  [30 %] 30 % Set Rate:  [14 bmp] 14 bmp Vt Set:  [570 mL] 570 mL PEEP:  [5 cmH20] 5 cmH20 Plateau Pressure:  [13 cmH20-17 cmH20] 13 cmH20  Temp:  [96.5 F (35.8 C)-99.4 F (37.4 C)] 97.9 F (36.6  C) (03/06 0800) Pulse Rate:  [55-108] 108 (03/06 0800) Resp:  [13-24] 13 (03/06 0800) BP: (129-183)/(58-90) 156/68 (03/06 0800) SpO2:  [96 %-100 %] 100 % (03/06 0800) FiO2 (%):  [30 %] 30 % (03/06 0733) Weight:  [107.8 kg (237 lb 10.5 oz)] 107.8 kg (237 lb 10.5 oz) (03/06 0500)  General:  Intubated. No distress.Follows commands. Good volumes on PST Integument:  Warm & dry. No rash  HEENT:  No scleral icterus. ETT in place. Moist mucus membranes. Neurological:  Pupils symmetric.No spontaneous movements. Pupils symmetric & reactive. CN grossly intact.  Musculoskeletal:  No joint effusion or erythema appreciated. Symmetric muscle bulk. Pulmonary:  Symmetric chest wall rise on ventilator. Clear breath sounds bilaterally. Cardiovascular: brady rate & rhythm. No appreciable JVD. Normal S1 & S2. Telemetry:  Sinus rhythm. Abdomen:  Soft. Nondistended. Normoactive bowel sounds.  LINES/TUBES: OETT 3/1 >>> Foley 3/1 >>> OGT 3/1 >>> PIV  CBC Latest Ref Rng & Units 10/21/2016 10/20/2016 10/19/2016  WBC 4.0 - 10.5 K/uL 8.1 9.2 14.0(H)  Hemoglobin 13.0 - 17.0 g/dL 13.6 12.7(L) 14.3  Hematocrit 39.0 - 52.0 % 41.3 39.4 43.3  Platelets 150 - 400 K/uL 83(L) 73(L) 91(L)    BMP Latest Ref Rng & Units 10/21/2016 10/20/2016 10/19/2016  Glucose 65 - 99 mg/dL 150(H) 157(H) 134(H)  BUN 6 - 20 mg/dL '11 12 16  ' Creatinine 0.61 - 1.24 mg/dL 0.54(L) 0.62 0.76  Sodium 135 - 145 mmol/L 136 137 138  Potassium 3.5 -  5.1 mmol/L 3.3(L) 3.2(L) 3.6  Chloride 101 - 111 mmol/L 100(L) 105 102  CO2 22 - 32 mmol/L '29 28 28  ' Calcium 8.9 - 10.3 mg/dL 8.1(L) 7.4(L) 8.6(L)   Hepatic Function Latest Ref Rng & Units 10/18/2016 10/16/2016 11/21/2014  Total Protein 6.5 - 8.1 g/dL 7.2 8.6(H) -  Albumin 3.5 - 5.0 g/dL 3.3(L) 4.2 -  AST 15 - 41 U/L 57(H) 79(H) 47(A)  ALT 17 - 63 U/L 28 42 34  Alk Phosphatase 38 - 126 U/L 54 94 93  Total Bilirubin 0.3 - 1.2 mg/dL 0.8 0.6 -     ASSESSMENT/PLAN:  56 y.o. male with meningiomas vs alcohol  withdrawal as etiology of seizures. Neurology following, on cEEG monitoring  1. New onset status epilepticus possible form brain masses vs ETOH withdrawal: Neurology following. Keppra, vimpat per Neuro.  Plan to wean off if with no recurrence of szes. Phenytoin has been discontinued 3/5.  Also on scheduled ativan and lorazepam IV pushes prn. Will decrease ativan dose and eventually wean off (switch to IV as we are extubating). Seizure precautions.   Neurosurgery has been consulted >> holding off on surgery.  Appreciate neuro and neurosurgery recs.  Cont decadron for brain swelling. Wean off steroids in 1-2 days if OK with neurosurgery/neurology.  2. Acute hypoxic respiratory failure: For airway  protection in the setting of seizure. Did well on PST this am. Extubated just now.  Looks comfortable.  3. Acute encephalopathy, significantly improved 2/2 szes/ETOH withdrawal. Ct Precedex infusion. D/C fentanyl drip (WOF withdrawal as he has been on a big dose since admission). Cont daily thiamine/folic acid. Weaning off ativan. CIWA protocol.  4. Chronic alcohol use:  Scheduled benzos as well as prn. Will decrease dose of ativan on 3/6. Continuing daily thiamine and folic acid IV. 5. Thrombocytopenia: ? Alcohol induced  Continuing to trend cell counts daily with CBC. 6. Essential hypertension: Holding home lisinopril & Lopressor. 7. Hyponatremia:  Likely beer potomania. Resolved  8. BPH: Foley catheter to remain in place. Holding home Rapaflo & Proscar. Consider d/c in am.  9. Neuropathy: Holding Soma & gabapentin. 10. Tobacco use disorder: nicotine patch  11. Meningiomas:  Noted on MRI. Decadron per neuro , neurosurgery input appreciated.  Holding off on surgery for now. Consider weaning off decadron once OK with neuro.    Prophylaxis:  SCDs & Protonix  Diet:  TF on hold as pt was just extubated.  Code Status: Full code Disposition:  Patient remains critically ill.  Family Update:  Wife updated  3/6    I spent  30 minutes of Critical Care time with this patient today.   Monica Becton, MD 10/21/2016, 8:38 AM [place] [facility] Pager [phone] After 3 pm or if no answer, call [phone]     8:38 AM 10/21/16

## 2016-10-21 NOTE — Plan of Care (Signed)
Patient has strong productive cough, coughs with ice chips but family states he coughs like that at home, will continue to assess

## 2016-10-21 NOTE — Progress Notes (Signed)
[place] Progress Note Patient Name: Frederick Drake DOB: 1961-05-07 MRN: [id number]   Date of Service  10/21/2016  HPI/Events of Note  Off vent ppi not home med dc  eICU Interventions       Intervention Category Intermediate Interventions: Best-practice therapies (e.g. DVT, beta blocker, etc.)  Raylene Miyamoto. 10/21/2016, 9:35 PM

## 2016-10-21 NOTE — Progress Notes (Signed)
[facility] ADULT ICU REPLACEMENT PROTOCOL FOR AM LAB REPLACEMENT ONLY  The patient does apply for the [facility] Adult ICU Electrolyte Replacment Protocol based on the criteria listed below:   1. Is GFR >/= 40 ml/min? Yes.    Patient's GFR today is >60 2. Is urine output >/= 0.5 ml/kg/hr for the last 6 hours? Yes.   Patient's UOP is 1.8 ml/kg/hr 3. Is BUN < 60 mg/dL? Yes.    Patient's BUN today is 11 4. Abnormal electrolyte(s): K3.3, Mg 1.7 5. Ordered repletion with: perprotocol 6. If a panic level lab has been reported, has the CCM MD in charge been notified? Yes.  .   Physician:  Noni Saupe, MD  [facility] 10/21/2016 5:04 AM

## 2016-10-22 DIAGNOSIS — R4701 Aphasia: Secondary | ICD-10-CM

## 2016-10-22 LAB — GLUCOSE, CAPILLARY
GLUCOSE-CAPILLARY: 131 mg/dL — AB (ref 65–99)
GLUCOSE-CAPILLARY: 151 mg/dL — AB (ref 65–99)
GLUCOSE-CAPILLARY: 107 mg/dL — AB (ref 65–99)
Glucose-Capillary: 91 mg/dL (ref 65–99)
Glucose-Capillary: 99 mg/dL (ref 65–99)

## 2016-10-22 LAB — BASIC METABOLIC PANEL
Anion gap: 9 (ref 5–15)
BUN: 14 mg/dL (ref 6–20)
CALCIUM: 8.6 mg/dL — AB (ref 8.9–10.3)
CO2: 28 mmol/L (ref 22–32)
CREATININE: 0.5 mg/dL — AB (ref 0.61–1.24)
Chloride: 99 mmol/L — ABNORMAL LOW (ref 101–111)
GFR calc non Af Amer: >60 mL/min (ref >60)
Glucose, Bld: 131 mg/dL — ABNORMAL HIGH (ref 65–99)
Potassium: 3.9 mmol/L (ref 3.5–5.1)
Sodium: 136 mmol/L (ref 135–145)

## 2016-10-22 LAB — PHOSPHORUS: PHOSPHORUS: 4.2 mg/dL (ref 2.5–4.6)

## 2016-10-22 LAB — MAGNESIUM: Magnesium: 2.1 mg/dL (ref 1.7–2.4)

## 2016-10-22 LAB — CBC
HEMATOCRIT: 42.2 % (ref 39.0–52.0)
Hemoglobin: 14 g/dL (ref 13.0–17.0)
MCH: 32.6 pg (ref 26.0–34.0)
MCHC: 33.2 g/dL (ref 30.0–36.0)
MCV: 98.4 fL (ref 78.0–100.0)
Platelets: 96 10*3/uL — ABNORMAL LOW (ref 150–400)
RBC: 4.29 MIL/uL (ref 4.22–5.81)
RDW: 12.5 % (ref 11.5–15.5)
WBC: 9.2 10*3/uL (ref 4.0–10.5)

## 2016-10-22 MED ORDER — LACOSAMIDE 200 MG/20ML IV SOLN
50.0000 mg | Freq: Two times a day (BID) | INTRAVENOUS | Status: DC
  Filled 2016-10-22: qty 5

## 2016-10-22 MED ORDER — DEXAMETHASONE SODIUM PHOSPHATE 4 MG/ML IJ SOLN
4.0000 mg | Freq: Four times a day (QID) | INTRAMUSCULAR | Status: DC
  Administered 2016-10-22 – 2016-10-23 (×4): 4 mg via INTRAVENOUS
  Filled 2016-10-22 (×3): qty 1
  Filled 2016-10-22: qty 0.4
  Filled 2016-10-22: qty 1

## 2016-10-22 MED ORDER — FENTANYL CITRATE (PF) 100 MCG/2ML IJ SOLN
12.5000 ug | INTRAMUSCULAR | Status: DC | PRN
  Administered 2016-10-22 – 2016-10-23 (×5): 12.5 ug via INTRAVENOUS
  Filled 2016-10-22 (×5): qty 2

## 2016-10-22 NOTE — Progress Notes (Signed)
Neurology Progress Note  Subjective: He has done well overnight, no significant overnight events. He is sitting up in a chair this morning and wants to know when he will be able to eat and drink. His Vimpat dose was reduced to 100 mg BID last night. No further seizure activity reported. He feels weak all over but denies any lateralizing symptoms. He denies headache. 14-point ROS otherwise unremarkable.   Medications reviewed and reconciled.   Pertinent meds: Cyanocobalamin 1000 mcg IM daily Decadron 6 mg IV q6h lacosamide 100 ng IV q12h Phenytoin 100 mg IV q8h  Current Meds:   Current Facility-Administered Medications:  .  0.9 %  sodium chloride infusion, , Intravenous, Continuous, Jose Angelo A de [place], MD, Last Rate: 10 mL/hr at 10/22/16 0800 .  acetaminophen (TYLENOL) solution 650 mg, 650 mg, Oral, Q6H PRN **OR** acetaminophen (TYLENOL) suppository 650 mg, 650 mg, Rectal, Q4H PRN, Chesley Mires, MD .  Chlorhexidine Gluconate Cloth 2 % PADS 6 each, 6 each, Topical, Daily, Chesley Mires, MD, 6 each at 10/22/16 [phone] .  cyanocobalamin ((VITAMIN B-12)) injection 1,000 mcg, 1,000 mcg, Intramuscular, Daily, [place] Sherrye Payor, MD, 1,000 mcg at 10/22/16 0924 .  dexamethasone (DECADRON) injection 6 mg, 6 mg, Intravenous, Q6H, Greta Doom, MD, 6 mg at 10/22/16 0522 .  dexmedetomidine (PRECEDEX) 400 MCG/100ML (4 mcg/mL) infusion, 0.4-1.2 mcg/kg/hr, Intravenous, Titrated, Juanito Doom, MD, Stopped at 10/22/16 0900 .  folic acid injection 1 mg, 1 mg, Intravenous, Daily, Javier Glazier, MD, 1 mg at 10/22/16 0924 .  insulin aspart (novoLOG) injection 2-6 Units, 2-6 Units, Subcutaneous, Q4H, Corey Harold, NP, 4 Units at 10/22/16 [phone]  labetalol (NORMODYNE,TRANDATE) injection 10 mg, 10 mg, Intravenous, Q2H PRN, Javier Glazier, MD, 10 mg at 10/21/16 0241 .  lacosamide (VIMPAT) 100 mg in sodium chloride 0.9 % 25 mL IVPB, 100 mg, Intravenous, Q12H, Darrel Reach, MD, 100 mg at 10/22/16  0944 .  LORazepam (ATIVAN) injection 2 mg, 2 mg, Intravenous, PRN, Chesley Mires, MD, 2 mg at 10/19/16 0251 .  MEDLINE mouth rinse, 15 mL, Mouth Rinse, BID, [place], MD, 15 mL at 10/22/16 2707 .  nicotine (NICODERM CQ - dosed in mg/24 hours) patch 14 mg, 14 mg, Transdermal, Daily, Javier Glazier, MD, 14 mg at 10/22/16 0925 .  phenytoin (DILANTIN) injection 100 mg, 100 mg, Intravenous, Q8H, Greta Doom, MD, 100 mg at 10/22/16 0521 .  sodium chloride flush (NS) 0.9 % injection 10-40 mL, 10-40 mL, Intracatheter, Q12H, Chesley Mires, MD, 10 mL at 10/22/16 0927 .  sodium chloride flush (NS) 0.9 % injection 10-40 mL, 10-40 mL, Intracatheter, PRN, Chesley Mires, MD .  thiamine (B-1) injection 100 mg, 100 mg, Intravenous, Daily, Javier Glazier, MD, 100 mg at 10/22/16 0925  Objective:  Temp:  [96.4 F (35.8 C)-98 F (36.7 C)] 98 F (36.7 C) (03/07 0800) Pulse Rate:  [51-67] 59 (03/07 0900) Resp:  [11-18] 14 (03/07 0900) BP: (122-159)/(53-138) 140/69 (03/07 0800) SpO2:  [94 %-100 %] 96 % (03/07 0900) Weight:  [103.6 kg (228 lb 6.3 oz)] 103.6 kg (228 lb 6.3 oz) (03/07 0400)  General: WDWN Caucasian man sitting up in chair, mildly disheveled in NAD. Alert, oriented to all but day and date. Speech is clear without dysarthria. Affect is restricted. Comportment is normal.  HEENT: Neck is supple without lymphadenopathy. Mucous membranes are slightly dry and the oropharynx is clear. Sclerae are anicteric. There is no conjunctival injection.  CV: Regular,  no murmur. Carotid pulses are 2+ and symmetric with no bruits. Distal pulses 2+ and symmetric.  Lungs: CTAB  Extremities: No C/C/E. Neuro: MS: As noted above.  CN: Pupils are equal and reactive from 3-->2 mm bilaterally. EOMI, no nystagmus. There is mild breakup of smooth pursuits in all directions of gaze. Facial sensation is intact to light touch. Face is symmetric at rest with normal strength and mobility. Hearing is intact to  conversational voice. Voice is normal in tone and quality. Palate elevates symmetrically. Uvula is midline. Bilateral SCM and trapezii are 5/5. Tongue is midline with normal bulk and mobility.  Motor: Normal bulk, tone, and strength throughout. No pronator drift. No tremor or other abnormal movements are observed.  Sensation: Intact to light touch.  DTRs: 2+, symmetric. Toes are downgoing bilaterally. No pathological reflexes.  Coordination: Finger-to-nose is slow but is without dysmetria bilaterally.   Labs: Lab Results  Component Value Date   WBC 9.2 10/22/2016   HGB 14.0 10/22/2016   HCT 42.2 10/22/2016   PLT 96 (L) 10/22/2016   GLUCOSE 131 (H) 10/22/2016   CHOL 202 (A) 11/21/2014   TRIG 60 11/21/2014   HDL 104 (A) 11/21/2014   LDLCALC 86 11/21/2014   ALT 28 10/18/2016   AST 57 (H) 10/18/2016   NA 136 10/22/2016   K 3.9 10/22/2016   CL 99 (L) 10/22/2016   CREATININE 0.50 (L) 10/22/2016   BUN 14 10/22/2016   CO2 28 10/22/2016   TSH 5.30 12/28/2014   PSA 0.2 11/21/2014   HGBA1C 5.2 10/17/2016   CBC Latest Ref Rng & Units 10/22/2016 10/21/2016 10/20/2016  WBC 4.0 - 10.5 K/uL 9.2 8.1 9.2  Hemoglobin 13.0 - 17.0 g/dL 14.0 13.6 12.7(L)  Hematocrit 39.0 - 52.0 % 42.2 41.3 39.4  Platelets 150 - 400 K/uL 96(L) 83(L) 73(L)    Lab Results  Component Value Date   HGBA1C 5.2 10/17/2016   Lab Results  Component Value Date   ALT 28 10/18/2016   AST 57 (H) 10/18/2016   ALKPHOS 54 10/18/2016   BILITOT 0.8 10/18/2016   Mg 2.1  Radiology: There is no new neuroimaging for review.   A/P:   1. Seizure: He had what sounds like focal seizures that appear to have originated in the L temporal lobe based on cortical irritability seen in that region on EEG. This would correspond to a large meningioma in the L temporoparietal region. Seizures may have been further precipitated by alcohol abuse and/or withdrawal. He has remained seizure-free. Continue to taper lacosamide--will decrease to 50 mg  BID x2 doses on 3/8 then stop; this can be switched to PO once he is able to swallow safely. Continue phenytoin 100 mg q8h--can change to ER formulation at dose of 300 mg qhs once able to take oral meds. Alcohol cessation is imperative for longterm seizure control and this was discussed with the patient. Seizure precautions. Per neurosurgery, likely craniotomy early next week.   2. Aphasia: This has resolved. This was seizure phenomenon vs Todd's phenomenon. No further issue at this time.   3. Acute encephalopathy: Likely multifactorial with contributions from alcohol abuse/withdrawal, intracranial masses with edema, recent respiratory failure, seizure, medications/polypharmacy (sedation, AEDs, high dose steroids), ICU admission, and length of hospital stay. Continue to optimize metabolic status as you are. Continue to treat any underlying infection. Minimize the use of opiates, benzos or any medication with strong anticholinergic properties as much as possible. Optimize sleep-wake cycles as much as you can by keeping the  room bright with activity during the day and dark and quiet at night.   4. Meningiomas: Appreciate neurosurgery assistance. Likely to OR next week per notes. Will decrease Decadron to 4 mg q6 today.    The patient would likely benefit from acute rehab services. Recommend consultation of PT/OT/SLP with consideration for PM&R consult as appropriate depending upon clinical progress and therapists' recommendations.   Fall risk: Risk factors for falls include use of psychotropic/sedative/hypnotic medications, polypharmacy, encephalopathy. Patient educated on risk of falls and use of call bell. Patient was counseled not to get up without assistance. Limit psychoactive medications and sedating medications.  Needs outpatient neurology follow-up?: He will need eventual outpatient referral once he is discharged from this admission.   This was discussed with the patient and his wife. Education  was provided on the diagnosis and expected evaluation and treatment. They are in agreement with the plan as noted. They were given the opportunity to ask any questions and these were addressed to their satisfaction.   Melba Coon, MD [facility]

## 2016-10-22 NOTE — Progress Notes (Signed)
[place] [facility] Attending Note  Presenting HPI:  56 y.o. male taken to [facility] ED 10/16/16 with seizure like activity at home.  Per his wife, he had 2 seizures earlier that day described as generalized tonic clonic.  On EMS arrival, he had a 3rd seizure and never returned to baseline as far as mental status was concerned. On arrival to ED, he was subsequently intubated for airway protection in the setting of status epilepticus.  CT of the head was then obtained and demonstrated 2.3cm mass in right frontal lobe with surrounding white matter edema along with 2.7 x 2.6 x 2.0cm mass in left parietal cortex above left tentorium without surrounding edema. He was then transferred to [facility] for further evaluation by neurosurgery. Of note, he has hx of seizure disorder but reportedly has not been on AED's since 2016.  Also has hx of chronic EtOH abuse - drinks 12 pack every day.  IMAGING/STUDIES: MRI BRAIN W/O 3/2:  3.2 x 5.6 x 4.8 cm LEFT temporal occipital meningioma with local invasion including LEFT sigmoid dural venous sinus invasion with low flow versus thrombosis. 3.2 x 3.4 x 1.4 cm RIGHT anterior cranial fossa meningioma with extensive vasogenic edema, potential parenchymal invasion. Abnormal signal LEFT hippocampus and to lesser extent cingulate gyrus compatible recent seizures/postictal state. No suspicious signal to suggest superimposed to suggest viral infection.  3/3 cEEG 4p no seizure last 24h  MICROBIOLOGY: MRSA PCR 3/2:  Negative   ANTIBIOTICS: None.   SIGNIFICANT EVENTS: 03/01 - Admit 3/2 cEEG monitoring 3/6 extubated.   Subjective:   No issues overnight.  (-) tolerating extubation.  On precedex drip.  Coughs when he swallows pills.  (-) recurrence of szes since admission   Vent Mode: PSV;CPAP FiO2 (%):  [30 %] 30 % PEEP:  [5 cmH20] 5 cmH20 Pressure Support:  [5 cmH20] 5 cmH20  Temp:  [96.4 F (35.8 C)-97.9 F (36.6 C)] 97.8 F (36.6 C) (03/07 0400) Pulse Rate:   [51-106] 51 (03/07 0700) Resp:  [11-19] 16 (03/07 0700) BP: (122-159)/(53-138) 147/72 (03/07 0700) SpO2:  [94 %-100 %] 94 % (03/07 0700) FiO2 (%):  [30 %] 30 % (03/06 0820) Weight:  [103.6 kg (228 lb 6.3 oz)] 103.6 kg (228 lb 6.3 oz) (03/07 0400)  General: awake, oriented x3, follows commands. NADT Integument:  Warm & dry. No rash  HEENT:  No scleral icterus.  Moist mucus membranes. (-) oral thrush Neurological:  . CN grossly intact. (-) lateralizing signs.  Musculoskeletal:  No joint effusion or erythema appreciated. Symmetric muscle bulk. Pulmonary:  Good ae.  Clear breath sounds bilaterally. Cardiovascular: good s1/s2. No appreciable JVD. Normal S1 & S2. Abdomen:  Soft. Nondistended. Normoactive bowel sounds. (-) masses  LINES/TUBES: OETT 3/1 >>> 3/6 Foley 3/1 >>> 3/6 OGT 3/1 >>> 3/6 PIV  CBC Latest Ref Rng & Units 10/22/2016 10/21/2016 10/20/2016  WBC 4.0 - 10.5 K/uL 9.2 8.1 9.2  Hemoglobin 13.0 - 17.0 g/dL 14.0 13.6 12.7(L)  Hematocrit 39.0 - 52.0 % 42.2 41.3 39.4  Platelets 150 - 400 K/uL 96(L) 83(L) 73(L)    BMP Latest Ref Rng & Units 10/22/2016 10/21/2016 10/20/2016  Glucose 65 - 99 mg/dL 131(H) 150(H) 157(H)  BUN 6 - 20 mg/dL '14 11 12  ' Creatinine 0.61 - 1.24 mg/dL 0.50(L) 0.54(L) 0.62  Sodium 135 - 145 mmol/L 136 136 137  Potassium 3.5 - 5.1 mmol/L 3.9 3.3(L) 3.2(L)  Chloride 101 - 111 mmol/L 99(L) 100(L) 105  CO2 22 - 32 mmol/L 28 29  28  Calcium 8.9 - 10.3 mg/dL 8.6(L) 8.1(L) 7.4(L)   Hepatic Function Latest Ref Rng & Units 10/18/2016 10/16/2016 11/21/2014  Total Protein 6.5 - 8.1 g/dL 7.2 8.6(H) -  Albumin 3.5 - 5.0 g/dL 3.3(L) 4.2 -  AST 15 - 41 U/L 57(H) 79(H) 47(A)  ALT 17 - 63 U/L 28 42 34  Alk Phosphatase 38 - 126 U/L 54 94 93  Total Bilirubin 0.3 - 1.2 mg/dL 0.8 0.6 -     ASSESSMENT/PLAN:  56 y.o. male with meningiomas and likely alcohol withdrawal as etiology of seizures. Neurology and Neurosurgery following.  Intubated for airway protection with the seizures.   Extubated on 3/6 and doing well. (-) recurrence of szes since admission.   1. New onset status epilepticus possible form brain masses (meningioma) and possibly ETOH withdrawal: Neurology and neurosurgery following.   - AED per Neurology.  Goal is for monotherapy with Phenytoin.  Keppra has been discontinued.  Vimpat being weaned off.   - Wean off precedex on 3/7.   - Will d/c scheduled ativan on 3/7.  Keep on prn ativan for ETOH/withdrawal and szes  - Cont decadron for now.  Will defer to neurology re: adjusting the dose  - Plan for Craniotomy next week unless he is discharged prior to that.  I discussed plans with pt and suggested that he proceeds with surgery next week 2. S/P Acute hypoxic respiratory failure 2/2 unable to protect airway    - Extubated on 3/6 and doing well.   - Keep O2 sats > 90% 3. S/P Acute encephalopathy 2/2 szes + ETOH withdrawal  - He is at baseline, awake, oriented x3  - Wean off precedex. Cont ativan prn. Cont CIWA protocol  - Cont thiamine and folic acid 4.  Nutrition  - pt was noted to cough with med intake on 3/6  - plan for swallow evaln 3/7   Prophylaxis:  SCDs Diet:  For swallow evaln Code Status: Full code Disposition:  Transfer to SDU today >> then med surg in am.  Will transfer to [facility] as primary on 3/8.  PCCM off 3/8. Dr. Thereasa Solo aware.  Family Update:  Wife updated 3/6 D/C foley today.      Monica Becton, MD 10/22/2016, 8:03 AM [place] [facility] Pager [phone] After 3 pm or if no answer, call [phone]     8:03 AM 10/22/16

## 2016-10-22 NOTE — Progress Notes (Addendum)
Bedside swallow completed with patient, patient was able to drink water with and without a straw, eat applesauce and eat a cracker with no issues. Called and spoke with Dr. Corrie Dandy who gave verbal orders to DC swallow screen and heart healthy carb modified diet placed. Will continue to monitor patient for any swallowing difficulties.  Rowe Pavy, RN

## 2016-10-22 NOTE — Evaluation (Signed)
Physical Therapy Evaluation Patient Details Name: Frederick Drake MRN: [id number] DOB: 04-Jan-1961 Today's Date: 10/22/2016   History of Present Illness  Frederick Drake is a 56 y.o. M with PMH as outlined below.  He was taken to [facility] ED 10/16/16 with seizure like activity at home.  Per his wife, he had 2 seizures earlier that day described as generalized tonic clonic.  On EMS arrival, he had a 3rd seizure and never returned to baseline as far as mental status was concerned.  Clinical Impression  Pt presents with decreased balance, strength and activity tolerance secondary to above. PTA pt was I, however, now requires assist for mobility. Per pt notes, scheduled for craniotomy next Tuesday. Will reassess d/c recommendation following surgery as appropriate. Acute PT will follow to enhance function prior to surgery.     Follow Up Recommendations Other (comment) (unsure, aware pt plan craniotomy next week, will reassess)    Equipment Recommendations  None recommended by PT (to be determined)    Recommendations for Other Services       Precautions / Restrictions Precautions Precautions: Fall Precaution Comments: seizure precautions Restrictions Weight Bearing Restrictions: No      Mobility  Bed Mobility               General bed mobility comments: seated in chair upon arrival  Transfers Overall transfer level: Needs assistance Equipment used: Rolling walker (2 wheeled) Transfers: Sit to/from Stand Sit to Stand: Min assist         General transfer comment: verbal cues for technique, minA to power up trunk with foot block due to pt feet sliding, verbal cues for upright posture  Ambulation/Gait Ambulation/Gait assistance: Min assist;+2 safety/equipment Ambulation Distance (Feet): 100 Feet Assistive device: Rolling walker (2 wheeled) Gait Pattern/deviations: Step-through pattern;Decreased step length - right;Decreased step length - left;Decreased stride length;Trunk  flexed;Wide base of support Gait velocity: decreased Gait velocity interpretation: Below normal speed for age/gender General Gait Details: slow speed, verbal cues for upright posture and safety with RW, sats down to 89% causing cessation of ambulation, pt complaints of dizziness throughout amb with BP of 116/74, shaky gait with poor foot clearance  Stairs            Wheelchair Mobility    Modified Rankin (Stroke Patients Only)       Balance Overall balance assessment: Needs assistance Sitting-balance support: Feet supported;No upper extremity supported Sitting balance-Leahy Scale: Good Sitting balance - Comments: sat at edge of chair without LOB   Standing balance support: Single extremity supported Standing balance-Leahy Scale: Poor Standing balance comment: requires physical assist/AD to maintain balance                             Pertinent Vitals/Pain Pain Assessment: No/denies pain    Home Living Family/patient expects to be discharged to:: Private residence Living Arrangements: Spouse/significant other Available Help at Discharge: Family;Available 24 hours/day Type of Home: Mobile home Home Access: Stairs to enter Entrance Stairs-Rails: None Entrance Stairs-Number of Steps: 3 Home Layout: One level Home Equipment: Cane - single point Additional Comments: lives with wife who does not work    Prior Function Level of Independence: Independent         Comments: dump truck Medical illustrator Dominance   Dominant Hand: Right    Extremity/Trunk Assessment   Upper Extremity Assessment Upper Extremity Assessment: Generalized weakness    Lower Extremity Assessment Lower Extremity Assessment:  Generalized weakness    Cervical / Trunk Assessment Cervical / Trunk Assessment: Kyphotic  Communication   Communication: No difficulties  Cognition Arousal/Alertness: Awake/alert Behavior During Therapy: Flat affect Overall Cognitive Status: Within  Functional Limits for tasks assessed                 General Comments: A&O x 4    General Comments General comments (skin integrity, edema, etc.): coordination in B UE/LE intact, strength and sensation equal bilaterally     Exercises     Assessment/Plan    PT Assessment Patient needs continued PT services  PT Problem List Decreased strength;Decreased activity tolerance;Decreased balance;Decreased mobility;Decreased knowledge of use of DME       PT Treatment Interventions DME instruction;Gait training;Stair training;Functional mobility training;Therapeutic activities;Therapeutic exercise;Balance training;Patient/family education    PT Goals (Current goals can be found in the Care Plan section)  Acute Rehab PT Goals Patient Stated Goal: get back to my life PT Goal Formulation: With patient Time For Goal Achievement: 11/05/16 Potential to Achieve Goals: Fair    Frequency Min 3X/week   Barriers to discharge Decreased caregiver support level of A currently required    Co-evaluation               End of Session Equipment Utilized During Treatment: Gait belt Activity Tolerance: Patient tolerated treatment well Patient left: in chair;with call bell/phone within reach Nurse Communication: Mobility status PT Visit Diagnosis: Unsteadiness on feet (R26.81);Other abnormalities of gait and mobility (R26.89);Muscle weakness (generalized) (M62.81);Difficulty in walking, not elsewhere classified (R26.2);Other symptoms and signs involving the nervous system (R29.898);Dizziness and giddiness (R42)         Time: 4709-6283 PT Time Calculation (min) (ACUTE ONLY): 28 min   Charges:   PT Evaluation $PT Eval Moderate Complexity: 1 Procedure PT Treatments $Gait Training: 8-22 mins   PT G CodesTracie Harrier 11/10/2016, 3:33 PM   Tracie Harrier, SPT Acute Rehab SPT [phone]

## 2016-10-22 NOTE — Progress Notes (Signed)
[facility] Progress Note Patient Name: Frederick Drake DOB: [birth year] MRN: [id number]   Date of Service  10/22/2016  HPI/Events of Note  Pain Failed tylenal  Add low dos efent  eICU Interventions       Intervention Category Minor Interventions: Routine modifications to care plan (e.g. PRN medications for pain, fever)  Raylene Miyamoto. 10/22/2016, 8:04 PM

## 2016-10-22 NOTE — Progress Notes (Addendum)
Nutrition Follow Up  DOCUMENTATION CODES:   Not applicable  INTERVENTION:    Continue Heart Healthy/Carbohydrate Modified diet.  Monitor PO intake, add interventions accordingly.  NEW NUTRITION DIAGNOSIS:   Increased nutrient needs related to chronic illness as evidenced by estimated needs, ongoing  GOAL:   Patient will meet greater than or equal to 90% of their needs, progressing   MONITOR:   Diet advancement, PO intake, Labs, Weight trends, Skin, I & O's  ASSESSMENT:   56 y.o. male with meningioma seen on MRI of the brain. Question possible alcohol withdrawal as etiology of seizures. Neurology following.  Pt admitted with status ellipticus with brain mass x 2.   Extubated 3/6. TF (Vital AF 1.2 formula) discontinued via OGT. Advanced to Heart Healthy/Carbohydrate Modified diet. Labs and medications reviewed. CBG's [id number].  Diet Order:  Diet heart healthy/carb modified Room service appropriate? Yes; Fluid consistency: Thin  Skin:  Reviewed, no issues  Last BM:  PTA  Height:   Ht Readings from Last 1 Encounters:  10/17/16 6\' 2"  (1.88 m)    Weight:   Wt Readings from Last 1 Encounters:  10/22/16 228 lb 6.3 oz (103.6 kg)    Ideal Body Weight:  86.4 kg  BMI:  Body mass index is 29.32 kg/m.  Estimated Nutritional Needs:   Kcal:  2200-2400  Protein:  125-150 gm  Fluid:  2.2-2.4 L  EDUCATION NEEDS:   No education needs identified at this time  Arthur Holms, RD, LDN Pager #: [phone] After-Hours Pager #: [phone]

## 2016-10-23 DIAGNOSIS — D329 Benign neoplasm of meninges, unspecified: Secondary | ICD-10-CM

## 2016-10-23 LAB — CBC
HEMATOCRIT: 42.8 % (ref 39.0–52.0)
Hemoglobin: 14.5 g/dL (ref 13.0–17.0)
MCH: 32.7 pg (ref 26.0–34.0)
MCHC: 33.9 g/dL (ref 30.0–36.0)
MCV: 96.6 fL (ref 78.0–100.0)
Platelets: 149 10*3/uL — ABNORMAL LOW (ref 150–400)
RBC: 4.43 MIL/uL (ref 4.22–5.81)
RDW: 12.8 % (ref 11.5–15.5)
WBC: 14 10*3/uL — AB (ref 4.0–10.5)

## 2016-10-23 LAB — GLUCOSE, CAPILLARY
GLUCOSE-CAPILLARY: 107 mg/dL — AB (ref 65–99)
GLUCOSE-CAPILLARY: 112 mg/dL — AB (ref 65–99)
Glucose-Capillary: 115 mg/dL — ABNORMAL HIGH (ref 65–99)
Glucose-Capillary: 115 mg/dL — ABNORMAL HIGH (ref 65–99)
Glucose-Capillary: 130 mg/dL — ABNORMAL HIGH (ref 65–99)

## 2016-10-23 LAB — BASIC METABOLIC PANEL
ANION GAP: 9 (ref 5–15)
BUN: 20 mg/dL (ref 6–20)
CALCIUM: 8.5 mg/dL — AB (ref 8.9–10.3)
CO2: 27 mmol/L (ref 22–32)
Chloride: 99 mmol/L — ABNORMAL LOW (ref 101–111)
Creatinine, Ser: 0.63 mg/dL (ref 0.61–1.24)
Glucose, Bld: 115 mg/dL — ABNORMAL HIGH (ref 65–99)
Potassium: 3.6 mmol/L (ref 3.5–5.1)
SODIUM: 135 mmol/L (ref 135–145)

## 2016-10-23 LAB — SURGICAL PCR SCREEN
MRSA, PCR: NEGATIVE
STAPHYLOCOCCUS AUREUS: NEGATIVE

## 2016-10-23 LAB — PHOSPHORUS: PHOSPHORUS: 4.2 mg/dL (ref 2.5–4.6)

## 2016-10-23 LAB — MAGNESIUM: MAGNESIUM: 2 mg/dL (ref 1.7–2.4)

## 2016-10-23 MED ORDER — PHENYTOIN SODIUM EXTENDED 100 MG PO CAPS
300.0000 mg | ORAL_CAPSULE | Freq: Every day | ORAL | Status: DC
  Administered 2016-10-23 – 2016-10-24 (×2): 300 mg via ORAL
  Filled 2016-10-23 (×2): qty 3

## 2016-10-23 MED ORDER — DEXAMETHASONE 4 MG PO TABS
4.0000 mg | ORAL_TABLET | Freq: Four times a day (QID) | ORAL | Status: DC
  Administered 2016-10-23 – 2016-10-25 (×8): 4 mg via ORAL
  Filled 2016-10-23 (×9): qty 1

## 2016-10-23 MED ORDER — VITAMIN B-1 100 MG PO TABS
100.0000 mg | ORAL_TABLET | Freq: Every day | ORAL | Status: DC
  Administered 2016-10-24 – 2016-10-25 (×2): 100 mg via ORAL
  Filled 2016-10-23 (×2): qty 1

## 2016-10-23 MED ORDER — LACOSAMIDE 50 MG PO TABS
50.0000 mg | ORAL_TABLET | Freq: Two times a day (BID) | ORAL | Status: AC
  Administered 2016-10-23 (×2): 50 mg via ORAL
  Filled 2016-10-23 (×2): qty 1

## 2016-10-23 MED ORDER — GUAIFENESIN-DM 100-10 MG/5ML PO SYRP
5.0000 mL | ORAL_SOLUTION | ORAL | Status: DC | PRN
  Administered 2016-10-23 – 2016-10-25 (×4): 5 mL via ORAL
  Filled 2016-10-23 (×4): qty 5

## 2016-10-23 MED ORDER — FOLIC ACID 1 MG PO TABS
1.0000 mg | ORAL_TABLET | Freq: Every day | ORAL | Status: DC
  Administered 2016-10-24 – 2016-10-25 (×2): 1 mg via ORAL
  Filled 2016-10-23 (×2): qty 1

## 2016-10-23 NOTE — Evaluation (Signed)
Occupational Therapy Evaluation Patient Details Name: Frederick Drake MRN: [id number] DOB: 1960/11/29 Today's Date: 10/23/2016    History of Present Illness Frederick Drake is a 56 y.o. M with PMH as outlined below.  He was taken to [facility] ED 10/16/16 with seizure like activity at home.  Per his wife, he had 2 seizures earlier that day described as generalized tonic clonic.  On EMS arrival, he had a 3rd seizure and never returned to baseline as far as mental status was concerned.   Clinical Impression   Pt reports he was independent with ADL PTA. Currently pt overall min assist for ADL and functional mobility. Pt presenting with poor standing balance, flat affect, and generalized weakness impacting his independence and safety with ADL and functional mobility. Noted plan for craniotomy early next week therefore unsure of follow up recommendations at this time; will continue to follow pt acutely and assess for d/c planning. Pt would benefit from continued skilled OT to address established goals.    Follow Up Recommendations  Other (comment) (unsure, plan for craniotomy next week, will continue to assess)    Equipment Recommendations  Other (comment) (TBD)    Recommendations for Other Services       Precautions / Restrictions Precautions Precautions: Fall Precaution Comments: seizure precautions Restrictions Weight Bearing Restrictions: No      Mobility Bed Mobility Overal bed mobility: Needs Assistance Bed Mobility: Supine to Sit     Supine to sit: Supervision     General bed mobility comments: Increased time required; no physical assist needed. HOB flat without use of bed rail.  Transfers Overall transfer level: Needs assistance Equipment used: None Transfers: Sit to/from Stand Sit to Stand: Min assist         General transfer comment: Min assist for steadying balance. Pt able to boost up from EOB without physical assist    Balance Overall balance assessment: Needs  assistance Sitting-balance support: Feet supported;No upper extremity supported Sitting balance-Leahy Scale: Good     Standing balance support: Single extremity supported Standing balance-Leahy Scale: Poor                              ADL Overall ADL's : Needs assistance/impaired Eating/Feeding: Set up;Sitting   Grooming: Minimal assistance;Standing Grooming Details (indicate cue type and reason): for balance Upper Body Bathing: Min guard;Sitting   Lower Body Bathing: Minimal assistance;Sit to/from stand   Upper Body Dressing : Min guard;Sitting   Lower Body Dressing: Minimal assistance;Sit to/from stand Lower Body Dressing Details (indicate cue type and reason): assist for balance. Pt able to adjust socks sitting EOB Toilet Transfer: Minimal assistance;Ambulation (holding IV pole) Toilet Transfer Details (indicate cue type and reason): Simulated by sit to stand from EOB with functional mobility in room         Functional mobility during ADLs: Minimal assistance (holding IV pole)       Vision Baseline Vision/History: Wears glasses Wears Glasses: Reading only Patient Visual Report: No change from baseline Vision Assessment?: No apparent visual deficits     Perception     Praxis      Pertinent Vitals/Pain Pain Assessment: No/denies pain ("just feel bad over all")     Hand Dominance Right   Extremity/Trunk Assessment Upper Extremity Assessment Upper Extremity Assessment: Generalized weakness   Lower Extremity Assessment Lower Extremity Assessment: Defer to PT evaluation   Cervical / Trunk Assessment Cervical / Trunk Assessment: Kyphotic   Communication Communication  Communication: No difficulties   Cognition Arousal/Alertness: Awake/alert Behavior During Therapy: Flat affect Overall Cognitive Status: Within Functional Limits for tasks assessed                     General Comments       Exercises       Shoulder Instructions       Home Living Family/patient expects to be discharged to:: Private residence Living Arrangements: Spouse/significant other Available Help at Discharge: Family;Available 24 hours/day Type of Home: Mobile home Home Access: Stairs to enter Entrance Stairs-Number of Steps: 3 Entrance Stairs-Rails: None Home Layout: One level     Bathroom Shower/Tub: Teacher, early years/pre: Standard     Home Equipment: Cane - single point          Prior Functioning/Environment Level of Independence: Independent        Comments: dump truck driver        OT Problem List: Decreased strength;Impaired balance (sitting and/or standing);Decreased knowledge of use of DME or AE      OT Treatment/Interventions: Self-care/ADL training;Therapeutic exercise;Energy conservation;DME and/or AE instruction;Therapeutic activities;Patient/family education;Balance training    OT Goals(Current goals can be found in the care plan section) Acute Rehab OT Goals Patient Stated Goal: get back to my life OT Goal Formulation: With patient Time For Goal Achievement: 11/06/16 Potential to Achieve Goals: Good ADL Goals Pt Will Perform Grooming: with modified independence;standing Pt Will Perform Lower Body Bathing: with modified independence;sit to/from stand Pt Will Perform Lower Body Dressing: with modified independence;sit to/from stand Pt Will Transfer to Toilet: with modified independence;ambulating;regular height toilet Pt Will Perform Toileting - Clothing Manipulation and hygiene: with modified independence;sit to/from stand Pt Will Perform Tub/Shower Transfer: Tub transfer;with modified independence;ambulating  OT Frequency: Min 2X/week   Barriers to D/C:            Co-evaluation              End of Session Equipment Utilized During Treatment: Gait belt Nurse Communication: Mobility status  Activity Tolerance: Patient tolerated treatment well Patient left: in chair;with call  bell/phone within reach  OT Visit Diagnosis: Unsteadiness on feet (R26.81)                ADL either performed or assessed with clinical judgement  Time: 1400-1417 OT Time Calculation (min): 17 min Charges:  OT General Charges $OT Visit: 1 Procedure OT Evaluation $OT Eval Moderate Complexity: 1 Procedure G-Codes:     Lamontae Ricardo A. Ulice Brilliant, M.S., OTR/L Pager: [place] 10/23/2016, 2:26 PM

## 2016-10-23 NOTE — Progress Notes (Signed)
Pt c/o and exhibits persistent dry cough interfering with pt's ability to sleep/rest. [facility] mid-level textpaged to see if cough medicine is an option.  Will continue to monitor.

## 2016-10-23 NOTE — Progress Notes (Signed)
PROGRESS NOTE    Frederick Drake  KVQ:[id number] DOB: 24-Dec-1960 DOA: 10/16/2016 PCP: No PCP Per Patient   Brief Narrative:  56 year old male with a history of alcohol use came to the ED after having seizures at home on 10/16/2016. Per wife patient had had 2 seizures at home. Upon arrival he had another seizure and was subsequently intubated in the ER as it was thought he was in status epilepticus. CT of the head showed 2.3 cm right frontal lobe mass with surrounding edema and a 2.7 cm left parietal lobe mass without surrounding edema. He was transferred to [facility] for further care. Neurology and neurosurgery were consulted. MRI of the brain done showed its possible meningioma. He was started on antiepileptic with plans for possible craniotomy early next week.   Assessment & Plan:   Active Problems:   Status epilepticus ([place])   Acute respiratory failure (HCC)   Seizures (HCC)   Aphasia   Meningioma (HCC)   Acute encephalopathy   Brain mass  Status epilepticus likely due to brain mass-meningioma along with possible occult withdrawal -Neurology and neurosurgery consult in -Currently on Vimpat and phenytoin and his seizures are controlled -Continue Decadron and taper. Switch to oral dose on 10/23/2016 -Plan on craniotomy per neurosurgery next week  Acute encephalopathy-much improved  Alcohol abuse/withdrawal -Counseled on refraining from alcohol use -Ativan when necessary, continue CIWA protocol -Continue thiamine and folic acid  He passed a swallow screen this morning and is doing well  Aphasia-resolved    DVT prophylaxis: SCDs Code Status: Full Family Communication:  Patient comprehends well  Disposition Plan: If continues to do well, can tx to Med-surg.  Consultants:   Neurology  Neurosurgery  Procedures:   none  Antimicrobials:   None   Subjective: No complaints. She states he is feeling better.   Objective: Vitals:   10/23/16 1255 10/23/16 1300  10/23/16 1400 10/23/16 1500  BP:  (!) 159/96 (!) 149/86 125/75  Pulse:  (!) 111 89 (!) 103  Resp:  (!) 30 14 18   Temp: 98.3 F (36.8 C)     TempSrc: Oral     SpO2:  99% 96% 97%  Weight:      Height:        Intake/Output Summary (Last 24 hours) at 10/23/16 1559 Last data filed at 10/23/16 1500  Gross per 24 hour  Intake              525 ml  Output             1477 ml  Net             -952 ml   Filed Weights   10/20/16 0500 10/21/16 0500 10/22/16 0400  Weight: 106 kg (233 lb 11 oz) 107.8 kg (237 lb 10.5 oz) 103.6 kg (228 lb 6.3 oz)    Examination:  General exam: Appears calm and comfortable  Respiratory system: Clear to auscultation. Respiratory effort normal. Cardiovascular system: S1 & S2 heard, RRR. No JVD, murmurs, rubs, gallops or clicks. No pedal edema. Gastrointestinal system: Abdomen is nondistended, soft and nontender. No organomegaly or masses felt. Normal bowel sounds heard. Central nervous system: Alert and oriented. No focal neurological deficits. Extremities: Symmetric 5 x 5 power. Skin: No rashes, lesions or ulcers Psychiatry: Judgement and insight appear normal. Mood & affect appropriate.     Data Reviewed:   CBC:  Recent Labs Lab 10/18/16 0235 10/19/16 0308 10/20/16 0330 10/21/16 0400 10/22/16 0400 10/23/16 0317  WBC 8.7  14.0* 9.2 8.1 9.2 14.0*  NEUTROABS 7.6  --   --   --   --   --   HGB 14.9 14.3 12.7* 13.6 14.0 14.5  HCT 44.1 43.3 39.4 41.3 42.2 42.8  MCV 96.7 98.9 100.0 99.0 98.4 96.6  PLT 104* 91* 73* 83* 96* 378*   Basic Metabolic Panel:  Recent Labs Lab 10/19/16 0308 10/20/16 0330 10/21/16 0400 10/22/16 0400 10/23/16 0317  NA 138 137 136 136 135  K 3.6 3.2* 3.3* 3.9 3.6  CL 102 105 100* 99* 99*  CO2 28 28 29 28 27   GLUCOSE 134* 157* 150* 131* 115*  BUN 16 12 11 14 20   CREATININE 0.76 0.62 0.54* 0.50* 0.63  CALCIUM 8.6* 7.4* 8.1* 8.6* 8.5*  MG 2.2 1.8 1.7 2.1 2.0  PHOS 2.9 3.0 3.2 4.2 4.2   GFR: Estimated Creatinine  Clearance: 134 mL/min (by C-G formula based on SCr of 0.63 mg/dL). Liver Function Tests:  Recent Labs Lab 10/18/16 0235  AST 57*  ALT 28  ALKPHOS 54  BILITOT 0.8  PROT 7.2  ALBUMIN 3.3*   No results for input(s): LIPASE, AMYLASE in the last 168 hours. No results for input(s): AMMONIA in the last 168 hours. Coagulation Profile: No results for input(s): INR, PROTIME in the last 168 hours. Cardiac Enzymes: No results for input(s): CKTOTAL, CKMB, CKMBINDEX, TROPONINI in the last 168 hours. BNP (last 3 results) No results for input(s): PROBNP in the last 8760 hours. HbA1C: No results for input(s): HGBA1C in the last 72 hours. CBG:  Recent Labs Lab 10/22/16 2138 10/23/16 0315 10/23/16 0402 10/23/16 0819 10/23/16 1254  GLUCAP 107* 107* 115* 112* 115*   Lipid Profile: No results for input(s): CHOL, HDL, LDLCALC, TRIG, CHOLHDL, LDLDIRECT in the last 72 hours. Thyroid Function Tests: No results for input(s): TSH, T4TOTAL, FREET4, T3FREE, THYROIDAB in the last 72 hours. Anemia Panel: No results for input(s): VITAMINB12, FOLATE, FERRITIN, TIBC, IRON, RETICCTPCT in the last 72 hours. Sepsis Labs:  Recent Labs Lab 10/16/16 1736 10/17/16 1026 10/17/16 1542 10/17/16 2251  LATICACIDVEN 5.0* 1.4 1.3 1.5    Recent Results (from the past 240 hour(s))  MRSA PCR Screening     Status: None   Collection Time: 10/17/16 12:14 AM  Result Value Ref Range Status   MRSA by PCR NEGATIVE NEGATIVE Final    Comment:        The GeneXpert MRSA Assay (FDA approved for NASAL specimens only), is one component of a comprehensive MRSA colonization surveillance program. It is not intended to diagnose MRSA infection nor to guide or monitor treatment for MRSA infections.   Surgical PCR screen     Status: None   Collection Time: 10/23/16  8:25 AM  Result Value Ref Range Status   MRSA, PCR NEGATIVE NEGATIVE Final   Staphylococcus aureus NEGATIVE NEGATIVE Final    Comment:        The Xpert  SA Assay (FDA approved for NASAL specimens in patients over 21 years of age), is one component of a comprehensive surveillance program.  Test performance has been validated by [facility] for patients greater than or equal to 15 year old. It is not intended to diagnose infection nor to guide or monitor treatment.          Radiology Studies: No results found.      Scheduled Meds: . Chlorhexidine Gluconate Cloth  6 each Topical Daily  . cyanocobalamin  1,000 mcg Intramuscular Daily  . dexamethasone  4 mg  Oral Q6H  . [START ON 02/20/4359] folic acid  1 mg Oral Daily  . insulin aspart  2-6 Units Subcutaneous Q4H  . lacosamide  50 mg Oral BID  . mouth rinse  15 mL Mouth Rinse BID  . nicotine  14 mg Transdermal Daily  . phenytoin  300 mg Oral QHS  . sodium chloride flush  10-40 mL Intracatheter Q12H  . [START ON 10/24/2016] thiamine  100 mg Oral Daily   Continuous Infusions: . sodium chloride 10 mL/hr at 10/23/16 1500     LOS: 7 days    Time spent: 35 mins     Arine Foley Arsenio Loader, MD [facility] Pager [phone]   If 7PM-7AM, please contact night-coverage [email or web address] Password [email or web address] 10/23/2016, 3:59 PM

## 2016-10-23 NOTE — Progress Notes (Signed)
Neurology Progress Note  Subjective: No major overnight events. He passed his swallow screen and is now on a diet, which he is happy about. He complains of some headache this morning. Pain is "all over" though maybe worse in the frontal region. There is no associated nausea, vomiting, photophobia, or phonophobia. No further seizuresa have been reported. 14-point ROS otherwise unremarkable.   Medications reviewed and reconciled.   Pertinent meds: Cyanocobalamin 1000 mcg IM daily Decadron 4 mg IV q6h lacosamide 50 mg IV q12h Phenytoin 100 mg IV q8h  Current Meds:   Current Facility-Administered Medications:  .  0.9 %  sodium chloride infusion, , Intravenous, Continuous, Jose Angelo A de Larkin Ina, MD, Last Rate: 10 mL/hr at 10/23/16 0600 .  acetaminophen (TYLENOL) solution 650 mg, 650 mg, Oral, Q6H PRN, 650 mg at 10/22/16 1845 **OR** acetaminophen (TYLENOL) suppository 650 mg, 650 mg, Rectal, Q4H PRN, Chesley Mires, MD .  Chlorhexidine Gluconate Cloth 2 % PADS 6 each, 6 each, Topical, Daily, Chesley Mires, MD, 6 each at 10/22/16 0926 .  cyanocobalamin ((VITAMIN B-12)) injection 1,000 mcg, 1,000 mcg, Intramuscular, Daily, Rushil Sherrye Payor, MD, 1,000 mcg at 10/22/16 0924 .  dexamethasone (DECADRON) injection 4 mg, 4 mg, Intravenous, Q6H, Darrel Reach, MD, 4 mg at 10/23/16 0534 .  fentaNYL (SUBLIMAZE) injection 12.5 mcg, 12.5 mcg, Intravenous, Q2H PRN, Raylene Miyamoto, MD, 12.5 mcg at 10/23/16 0305 .  folic acid injection 1 mg, 1 mg, Intravenous, Daily, Javier Glazier, MD, 1 mg at 10/22/16 0924 .  insulin aspart (novoLOG) injection 2-6 Units, 2-6 Units, Subcutaneous, Q4H, Corey Harold, NP, 4 Units at 10/22/16 [id number] .  labetalol (NORMODYNE,TRANDATE) injection 10 mg, 10 mg, Intravenous, Q2H PRN, Javier Glazier, MD, 10 mg at 10/21/16 0241 .  lacosamide (VIMPAT) 50 mg in sodium chloride 0.9 % 25 mL IVPB, 50 mg, Intravenous, Q12H, Darrel Reach, MD .  LORazepam (ATIVAN) injection 2 mg, 2  mg, Intravenous, PRN, Chesley Mires, MD, 2 mg at 10/19/16 0251 .  MEDLINE mouth rinse, 15 mL, Mouth Rinse, BID, Jose Shirl Harris, MD, 15 mL at 10/22/16 2132 .  nicotine (NICODERM CQ - dosed in mg/24 hours) patch 14 mg, 14 mg, Transdermal, Daily, Javier Glazier, MD, 14 mg at 10/22/16 0925 .  phenytoin (DILANTIN) injection 100 mg, 100 mg, Intravenous, Q8H, Greta Doom, MD, 100 mg at 10/23/16 0534 .  sodium chloride flush (NS) 0.9 % injection 10-40 mL, 10-40 mL, Intracatheter, Q12H, Chesley Mires, MD, 10 mL at 10/22/16 2127 .  sodium chloride flush (NS) 0.9 % injection 10-40 mL, 10-40 mL, Intracatheter, PRN, Chesley Mires, MD .  thiamine (B-1) injection 100 mg, 100 mg, Intravenous, Daily, Javier Glazier, MD, 100 mg at 10/22/16 0925  Objective:  Temp:  [97.7 F (36.5 C)-98.4 F (36.9 C)] 98.4 F (36.9 C) (03/08 0741) Pulse Rate:  [59-107] 87 (03/08 0600) Resp:  [8-18] 18 (03/08 0600) BP: (118-167)/(69-104) 157/80 (03/08 0600) SpO2:  [93 %-99 %] 98 % (03/08 0600)  General: WDWN Caucasian man resting quietly in bed, c/o headache. Alert, oriented x4. Speech is clear without dysarthria. Affect is restricted. Comportment is normal.  HEENT: Neck is supple without lymphadenopathy. Mucous membranes are slightly dry and the oropharynx is clear. Sclerae are anicteric. There is no conjunctival injection.  CV: Regular, no murmur. Carotid pulses are 2+ and symmetric with no bruits. Distal pulses 2+ and symmetric.  Lungs: CTAB  Extremities: No C/C/E. Neuro: MS: As noted above.  CN:  Pupils are equal and reactive from 3-->2 mm bilaterally. EOMI, no nystagmus. Facial sensation is intact to light touch. Face is symmetric at rest with normal strength and mobility. Hearing is intact to conversational voice. Voice is normal in tone and quality. Palate elevates symmetrically. Uvula is midline. Bilateral SCM and trapezii are 5/5. Tongue is midline with normal bulk and mobility.  Motor: Normal bulk,  tone, and strength throughout. No pronator drift. No tremor or other abnormal movements are observed.  Sensation: Intact to light touch.  DTRs: 2+, symmetric. Toes are downgoing bilaterally. No pathological reflexes.  Coordination: Finger-to-nose is slow but is without dysmetria bilaterally.   Labs: Lab Results  Component Value Date   WBC 14.0 (H) 10/23/2016   HGB 14.5 10/23/2016   HCT 42.8 10/23/2016   PLT 149 (L) 10/23/2016   GLUCOSE 115 (H) 10/23/2016   CHOL 202 (A) 11/21/2014   TRIG 60 11/21/2014   HDL 104 (A) 11/21/2014   LDLCALC 86 11/21/2014   ALT 28 10/18/2016   AST 57 (H) 10/18/2016   NA 135 10/23/2016   K 3.6 10/23/2016   CL 99 (L) 10/23/2016   CREATININE 0.63 10/23/2016   BUN 20 10/23/2016   CO2 27 10/23/2016   TSH 5.30 12/28/2014   PSA 0.2 11/21/2014   HGBA1C 5.2 10/17/2016   CBC Latest Ref Rng & Units 10/23/2016 10/22/2016 10/21/2016  WBC 4.0 - 10.5 K/uL 14.0(H) 9.2 8.1  Hemoglobin 13.0 - 17.0 g/dL 14.5 14.0 13.6  Hematocrit 39.0 - 52.0 % 42.8 42.2 41.3  Platelets 150 - 400 K/uL 149(L) 96(L) 83(L)    Lab Results  Component Value Date   HGBA1C 5.2 10/17/2016   Lab Results  Component Value Date   ALT 28 10/18/2016   AST 57 (H) 10/18/2016   ALKPHOS 54 10/18/2016   BILITOT 0.8 10/18/2016   Mg 2.0 Phos 4.2  Radiology: There is no new neuroimaging for review.   A/P:   1. Seizure: Presentation is consistent with focal seizures likely provoked by a large meningioma in the L temporoparietal region +/- alcohol abuse/ withdrawal. He has been seizure-free since presentation. Continue to wean lacosamide--will be off after today; will change to PO. Change phenytoin to 300 mg qhs. Counseled on alcohol cessation as abstinence is imperative for longterm seizure control. Seizure precautions. Per neurosurgery, likely craniotomy early next week.   2. Aphasia: This has resolved. This was seizure phenomenon vs Todd's phenomenon. No further issue at this time.   3. Acute  encephalopathy:This is much improved. Likely multifactorial with contributions from alcohol abuse/withdrawal, intracranial masses with edema, recent respiratory failure, seizure, medications/polypharmacy (sedation, AEDs, high dose steroids), ICU admission, and length of hospital stay. Continue to optimize metabolic status as you are. Continue to treat any underlying infection. Minimize the use of opiates, benzos or any medication with strong anticholinergic properties as much as possible. Optimize sleep-wake cycles as much as you can by keeping the room bright with activity during the day and dark and quiet at night.   4. Meningiomas: Appreciate neurosurgery assistance--likely to OR next week. Continue Decadron to 4 mg q6 for now, will change to PO.    The patient would likely benefit from acute rehab services. Recommend consultation of PT/OT/SLP with consideration for PM&R consult as appropriate depending upon clinical progress and therapists' recommendations.   Fall risk: Risk factors for falls include use of psychotropic/sedative/hypnotic medications, polypharmacy, encephalopathy. Patient educated on risk of falls and use of call bell. Patient was counseled not to get  up without assistance. Limit psychoactive medications and sedating medications.  Needs outpatient neurology follow-up?: He will need eventual outpatient referral once he is discharged from this admission.   This was discussed with the patient and his wife. Education was provided on the diagnosis and expected evaluation and treatment. They are in agreement with the plan as noted. They were given the opportunity to ask any questions and these were addressed to their satisfaction.   Melba Coon, MD [facility]

## 2016-10-23 NOTE — Progress Notes (Signed)
Patient ID: Frederick Drake, male   DOB: [birth year], 56 y.o.   MRN: [id number] BP 107/60 (BP Location: Left Arm)   Pulse 88   Temp 98 F (36.7 C) (Oral)   Resp 13   Ht 6\' 2"  (1.88 m)   Wt 103.6 kg (228 lb 6.3 oz)   SpO2 97%   BMI 29.32 kg/m  Alert and oriented to person, place, situation He did not know the year, and it took more time than is normal for him to tell me how old he is.  Followed all commands Perrl, full eom No drift Symmetric facial sensation Moving all extremities well Still plan on tumor resection next week.

## 2016-10-24 LAB — CBC
HCT: 41.1 % (ref 39.0–52.0)
Hemoglobin: 13.9 g/dL (ref 13.0–17.0)
MCH: 32.6 pg (ref 26.0–34.0)
MCHC: 33.8 g/dL (ref 30.0–36.0)
MCV: 96.5 fL (ref 78.0–100.0)
PLATELETS: 168 10*3/uL (ref 150–400)
RBC: 4.26 MIL/uL (ref 4.22–5.81)
RDW: 12.7 % (ref 11.5–15.5)
WBC: 12 10*3/uL — ABNORMAL HIGH (ref 4.0–10.5)

## 2016-10-24 LAB — GLUCOSE, CAPILLARY
GLUCOSE-CAPILLARY: 101 mg/dL — AB (ref 65–99)
GLUCOSE-CAPILLARY: 111 mg/dL — AB (ref 65–99)
GLUCOSE-CAPILLARY: 112 mg/dL — AB (ref 65–99)
GLUCOSE-CAPILLARY: 115 mg/dL — AB (ref 65–99)
GLUCOSE-CAPILLARY: 152 mg/dL — AB (ref 65–99)
Glucose-Capillary: 103 mg/dL — ABNORMAL HIGH (ref 65–99)
Glucose-Capillary: 104 mg/dL — ABNORMAL HIGH (ref 65–99)

## 2016-10-24 LAB — BASIC METABOLIC PANEL
Anion gap: 9 (ref 5–15)
BUN: 15 mg/dL (ref 6–20)
CO2: 27 mmol/L (ref 22–32)
Calcium: 8.6 mg/dL — ABNORMAL LOW (ref 8.9–10.3)
Chloride: 98 mmol/L — ABNORMAL LOW (ref 101–111)
Creatinine, Ser: 0.61 mg/dL (ref 0.61–1.24)
GFR calc Af Amer: >60 mL/min (ref >60)
GLUCOSE: 122 mg/dL — AB (ref 65–99)
POTASSIUM: 3.1 mmol/L — AB (ref 3.5–5.1)
Sodium: 134 mmol/L — ABNORMAL LOW (ref 135–145)

## 2016-10-24 LAB — PHENYTOIN LEVEL, TOTAL: Phenytoin Lvl: <2.5 ug/mL — ABNORMAL LOW (ref 10.0–20.0)

## 2016-10-24 LAB — PHOSPHORUS: Phosphorus: 3.6 mg/dL (ref 2.5–4.6)

## 2016-10-24 LAB — MAGNESIUM: Magnesium: 1.9 mg/dL (ref 1.7–2.4)

## 2016-10-24 MED ORDER — POTASSIUM CHLORIDE CRYS ER 20 MEQ PO TBCR
40.0000 meq | EXTENDED_RELEASE_TABLET | Freq: Once | ORAL | Status: AC
  Administered 2016-10-24: 40 meq via ORAL
  Filled 2016-10-24: qty 2

## 2016-10-24 NOTE — Progress Notes (Signed)
Physical Therapy Treatment Patient Details Name: LOIS OSTROM MRN: [id number] DOB: [birth year] Today's Date: 10/24/2016    History of Present Illness GABREAL WORTON is a 56 y.o. taken to [facility] ED 10/16/16 with seizure like activity at home found to have right frontal and left parietal mass. Intubated 3/1-3/6. Craniotomy planned. PMHx: HTN, BPH, neuropathy, ETOH abuse    PT Comments    Pt pleasant with flat affect with improved gait and activity tolerance. Pt with 3/5 hip flexion bil and educated for strengthening exercises and progressive ambulation. 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 Pt fatigued with gait and HEP and denied further activity today.   HR 82 sats 97-100% on RA   Follow Up Recommendations  Home health PT (pending future medical care)     Equipment Recommendations  Rolling walker with 5" wheels    Recommendations for Other Services       Precautions / Restrictions Precautions Precautions: Fall Restrictions Weight Bearing Restrictions: No    Mobility  Bed Mobility               General bed mobility comments: in chair on arrival  Transfers Overall transfer level: Needs assistance   Transfers: Sit to/from Stand Sit to Stand: Supervision         General transfer comment: cues for hand placement and safety  Ambulation/Gait Ambulation/Gait assistance: Min guard Ambulation Distance (Feet): 300 Feet Assistive device: Rolling walker (2 wheeled) Gait Pattern/deviations: Step-through pattern;Decreased stride length   Gait velocity interpretation: Below normal speed for age/gender General Gait Details: pt with steady gait and cues for position in RW, pt reports 3/10 dizziness unchanged throughout mobility   Stairs            Wheelchair Mobility    Modified Rankin (Stroke Patients Only)       Balance Overall balance assessment: Needs assistance   Sitting balance-Leahy  Scale: Good       Standing balance-Leahy Scale: Fair                      Cognition Arousal/Alertness: Awake/alert Behavior During Therapy: Flat affect Overall Cognitive Status: Impaired/Different from baseline Area of Impairment: Orientation;Problem solving Orientation Level: Time           Problem Solving: Slow processing      Exercises General Exercises - Lower Extremity Long Arc Quad: AROM;Both;Seated;20 reps Hip Flexion/Marching: AROM;Both;Seated;10 reps Toe Raises: AROM;20 reps;Both;Seated Heel Raises: AROM;20 reps;Both;Seated    General Comments        Pertinent Vitals/Pain Pain Assessment: 0-10 Pain Score: 8  Pain Location: HA Pain Descriptors / Indicators: Aching Pain Intervention(s): Limited activity within patient's tolerance;Repositioned;Monitored during session;Premedicated before session    Home Living                      Prior Function            PT Goals (current goals can now be found in the care plan section) Progress towards PT goals: Progressing toward goals    Frequency           PT Plan Current plan remains appropriate    Co-evaluation             End of Session Equipment Utilized During Treatment: Gait belt Activity Tolerance: Patient tolerated treatment well Patient left: in chair;with call bell/phone within reach Nurse Communication: Mobility status PT Visit Diagnosis: Difficulty in walking,  not elsewhere classified (R26.2);Pain     Time: 8343-7357 PT Time Calculation (min) (ACUTE ONLY): 18 min  Charges:  $Gait Training: 8-22 mins                    G Codes:       Oza Oberle B Zahi Plaskett 11-14-2016, 9:50 AM  Elwyn Reach, [place]

## 2016-10-24 NOTE — Progress Notes (Signed)
Patient ID: Frederick Drake, male   DOB: 11-Oct-1960, 56 y.o.   MRN: [id number] BP (!) 174/79   Pulse (!) 102   Temp 98.1 F (36.7 C) (Oral)   Resp 17   Ht 6\' 1"  (1.854 m)   Wt 99 kg (218 lb 4.1 oz)   SpO2 94%   BMI 28.80 kg/m  Alert and oriented x 4, speech is clear, fluent Voice is hoarse Pupils equal, round, and reactive to light Moving all extremities well OR next week.

## 2016-10-24 NOTE — Progress Notes (Signed)
PROGRESS NOTE    Frederick Drake  JOA:[id number] DOB: 01-20-1961 DOA: 10/16/2016 PCP: No PCP Per Patient   Brief Narrative:  56 year old male with a history of alcohol use came to the ED after having seizures at home on 10/16/2016. Per wife patient had had 2 seizures at home. Upon arrival he had another seizure and was subsequently intubated in the ER as it was thought he was in status epilepticus. CT of the head showed 2.3 cm right frontal lobe mass with surrounding edema and a 2.7 cm left parietal lobe mass without surrounding edema. He was transferred to [facility] for further care. Neurology and neurosurgery were consulted. MRI of the brain done showed its possible meningioma. He was started on antiepileptic with plans for possible craniotomy early next week.   Assessment & Plan:   Active Problems:   Status epilepticus ([place])   Acute respiratory failure (HCC)   Seizures (HCC)   Aphasia   Meningioma (HCC)   Acute encephalopathy   Brain mass  Status epilepticus likely due to brain mass-meningioma along with possible occult withdrawal -Neurology and neurosurgery consult in -Currently on Vimpat and phenytoin and his seizures are controlled -Continue Decadron and taper. Switch to oral dose on 10/23/2016 -Plan on craniotomy per neurosurgery next week  Acute encephalopathy-much improved  Alcohol abuse/withdrawal -Counseled on refraining from alcohol use -Ativan when necessary, continue CIWA protocol -Continue thiamine and folic acid  He passed a swallow screen this morning and is doing well  Aphasia-resolved  PT rec: home PT  DVT prophylaxis: SCDs Code Status: Full Family Communication:  Patient comprehends well  Disposition Plan:  tx to Med-surg.  Consultants:   Neurology  Neurosurgery  Procedures:   none  Antimicrobials:   None   Subjective: No complaints. He states he is feeling better. No acute events overnight.   Objective: Vitals:   10/24/16 0946  10/24/16 1000 10/24/16 1100 10/24/16 1245  BP:  (!) 144/79 140/82   Pulse: 82 84    Resp:  15 19   Temp:    97.9 F (36.6 C)  TempSrc:    Oral  SpO2: 97% 98%    Weight:      Height:        Intake/Output Summary (Last 24 hours) at 10/24/16 1415 Last data filed at 10/24/16 1000  Gross per 24 hour  Intake              390 ml  Output              425 ml  Net              -35 ml   Filed Weights   10/21/16 0500 10/22/16 0400 10/24/16 0500  Weight: 107.8 kg (237 lb 10.5 oz) 103.6 kg (228 lb 6.3 oz) 99 kg (218 lb 4.1 oz)    Examination:  General exam: Appears calm and comfortable  Respiratory system: Clear to auscultation. Respiratory effort normal. Cardiovascular system: S1 & S2 heard, RRR. No JVD, murmurs, rubs, gallops or clicks. No pedal edema. Gastrointestinal system: Abdomen is nondistended, soft and nontender. No organomegaly or masses felt. Normal bowel sounds heard. Central nervous system: Alert and oriented. No focal neurological deficits. Extremities: Symmetric 5 x 5 power. Skin: No rashes, lesions or ulcers Psychiatry: Judgement and insight appear normal. Mood & affect appropriate.     Data Reviewed:   CBC:  Recent Labs Lab 10/18/16 0235  10/20/16 0330 10/21/16 0400 10/22/16 0400 10/23/16 0317 10/24/16 0408  WBC  8.7  < > 9.2 8.1 9.2 14.0* 12.0*  NEUTROABS 7.6  --   --   --   --   --   --   HGB 14.9  < > 12.7* 13.6 14.0 14.5 13.9  HCT 44.1  < > 39.4 41.3 42.2 42.8 41.1  MCV 96.7  < > 100.0 99.0 98.4 96.6 96.5  PLT 104*  < > 73* 83* 96* 149* 168  < > = values in this interval not displayed. Basic Metabolic Panel:  Recent Labs Lab 10/20/16 0330 10/21/16 0400 10/22/16 0400 10/23/16 0317 10/24/16 0408  NA 137 136 136 135 134*  K 3.2* 3.3* 3.9 3.6 3.1*  CL 105 100* 99* 99* 98*  CO2 28 29 28 27 27   GLUCOSE 157* 150* 131* 115* 122*  BUN 12 11 14 20 15   CREATININE 0.62 0.54* 0.50* 0.63 0.61  CALCIUM 7.4* 8.1* 8.6* 8.5* 8.6*  MG 1.8 1.7 2.1 2.0 1.9    PHOS 3.0 3.2 4.2 4.2 3.6   GFR: Estimated Creatinine Clearance: 129.1 mL/min (by C-G formula based on SCr of 0.61 mg/dL). Liver Function Tests:  Recent Labs Lab 10/18/16 0235  AST 57*  ALT 28  ALKPHOS 54  BILITOT 0.8  PROT 7.2  ALBUMIN 3.3*   No results for input(s): LIPASE, AMYLASE in the last 168 hours. No results for input(s): AMMONIA in the last 168 hours. Coagulation Profile: No results for input(s): INR, PROTIME in the last 168 hours. Cardiac Enzymes: No results for input(s): CKTOTAL, CKMB, CKMBINDEX, TROPONINI in the last 168 hours. BNP (last 3 results) No results for input(s): PROBNP in the last 8760 hours. HbA1C: No results for input(s): HGBA1C in the last 72 hours. CBG:  Recent Labs Lab 10/23/16 1617 10/24/16 0004 10/24/16 0426 10/24/16 0919 10/24/16 1244  GLUCAP 130* 111* 112* 103* 104*   Lipid Profile: No results for input(s): CHOL, HDL, LDLCALC, TRIG, CHOLHDL, LDLDIRECT in the last 72 hours. Thyroid Function Tests: No results for input(s): TSH, T4TOTAL, FREET4, T3FREE, THYROIDAB in the last 72 hours. Anemia Panel: No results for input(s): VITAMINB12, FOLATE, FERRITIN, TIBC, IRON, RETICCTPCT in the last 72 hours. Sepsis Labs:  Recent Labs Lab 10/17/16 1542 10/17/16 2251  LATICACIDVEN 1.3 1.5    Recent Results (from the past 240 hour(s))  MRSA PCR Screening     Status: None   Collection Time: 10/17/16 12:14 AM  Result Value Ref Range Status   MRSA by PCR NEGATIVE NEGATIVE Final    Comment:        The GeneXpert MRSA Assay (FDA approved for NASAL specimens only), is one component of a comprehensive MRSA colonization surveillance program. It is not intended to diagnose MRSA infection nor to guide or monitor treatment for MRSA infections.   Surgical PCR screen     Status: None   Collection Time: 10/23/16  8:25 AM  Result Value Ref Range Status   MRSA, PCR NEGATIVE NEGATIVE Final   Staphylococcus aureus NEGATIVE NEGATIVE Final     Comment:        The Xpert SA Assay (FDA approved for NASAL specimens in patients over 81 years of age), is one component of a comprehensive surveillance program.  Test performance has been validated by [facility] for patients greater than or equal to 72 year old. It is not intended to diagnose infection nor to guide or monitor treatment.          Radiology Studies: No results found.      Scheduled Meds: .  Chlorhexidine Gluconate Cloth  6 each Topical Daily  . cyanocobalamin  1,000 mcg Intramuscular Daily  . dexamethasone  4 mg Oral W7P  . folic acid  1 mg Oral Daily  . insulin aspart  2-6 Units Subcutaneous Q4H  . mouth rinse  15 mL Mouth Rinse BID  . nicotine  14 mg Transdermal Daily  . phenytoin  300 mg Oral QHS  . sodium chloride flush  10-40 mL Intracatheter Q12H  . thiamine  100 mg Oral Daily   Continuous Infusions: . sodium chloride 10 mL/hr at 10/24/16 1144     LOS: 8 days    Time spent: 35 mins     Ankit Arsenio Loader, MD [facility] Pager [phone]   If 7PM-7AM, please contact night-coverage [email or web address] Password [email or web address] 10/24/2016, 2:15 PM

## 2016-10-25 LAB — GLUCOSE, CAPILLARY
GLUCOSE-CAPILLARY: 122 mg/dL — AB (ref 65–99)
GLUCOSE-CAPILLARY: 113 mg/dL — AB (ref 65–99)
Glucose-Capillary: 93 mg/dL (ref 65–99)

## 2016-10-25 LAB — MAGNESIUM: MAGNESIUM: 2.1 mg/dL (ref 1.7–2.4)

## 2016-10-25 LAB — BASIC METABOLIC PANEL
ANION GAP: 10 (ref 5–15)
BUN: 7 mg/dL (ref 6–20)
CO2: 27 mmol/L (ref 22–32)
Calcium: 8.6 mg/dL — ABNORMAL LOW (ref 8.9–10.3)
Chloride: 98 mmol/L — ABNORMAL LOW (ref 101–111)
Creatinine, Ser: 0.49 mg/dL — ABNORMAL LOW (ref 0.61–1.24)
GFR calc Af Amer: >60 mL/min (ref >60)
Glucose, Bld: 124 mg/dL — ABNORMAL HIGH (ref 65–99)
POTASSIUM: 3.5 mmol/L (ref 3.5–5.1)
SODIUM: 135 mmol/L (ref 135–145)

## 2016-10-25 LAB — CBC
HCT: 41.7 % (ref 39.0–52.0)
Hemoglobin: 14.2 g/dL (ref 13.0–17.0)
MCH: 32.7 pg (ref 26.0–34.0)
MCHC: 34.1 g/dL (ref 30.0–36.0)
MCV: 96.1 fL (ref 78.0–100.0)
PLATELETS: 199 10*3/uL (ref 150–400)
RBC: 4.34 MIL/uL (ref 4.22–5.81)
RDW: 12.3 % (ref 11.5–15.5)
WBC: 12.3 10*3/uL — AB (ref 4.0–10.5)

## 2016-10-25 LAB — PHOSPHORUS: Phosphorus: 3.3 mg/dL (ref 2.5–4.6)

## 2016-10-25 MED ORDER — PHENYTOIN SODIUM EXTENDED 300 MG PO CAPS: 300.0000 mg | ORAL_CAPSULE | Freq: Every day | ORAL | 0 refills | Status: DC

## 2016-10-25 MED ORDER — DEXAMETHASONE 2 MG PO TABS: 2.0000 mg | ORAL_TABLET | Freq: Four times a day (QID) | ORAL | 0 refills | Status: AC

## 2016-10-25 MED ORDER — DEXAMETHASONE 2 MG PO TABS
2.0000 mg | ORAL_TABLET | Freq: Four times a day (QID) | ORAL | Status: DC
  Administered 2016-10-25: 2 mg via ORAL

## 2016-10-25 MED ORDER — THIAMINE HCL 100 MG PO TABS: 100.0000 mg | ORAL_TABLET | Freq: Every day | ORAL | 1 refills | Status: DC

## 2016-10-25 MED ORDER — FOLIC ACID 1 MG PO TABS
1.0000 mg | ORAL_TABLET | Freq: Every day | ORAL | 1 refills | Status: DC
Start: 2016-10-26 — End: 2017-07-23

## 2016-10-25 NOTE — Progress Notes (Signed)
Paged Md.  Pt wants to go home, explained reason needs to be in hosp. Pt doesn't understand or comprehend why he needs to be here.  MD said he would come see pt again.

## 2016-10-25 NOTE — Progress Notes (Signed)
Neurology Progress Note  Subjective: He has been transferred to the floor. He reports that he was unable to sleep last night and hasn't slept since yesterday morning. He is not sure why, just states that he could not fall asleep last night. He complains of a sore throat and cough and states that he can barely talk--his voice is hoarse and quiet this morning. He states his appetite is poor. He continues to have mild generalized headache without associated nausea/vomiting/photophobia/phonophobia. Headache is not positional. It is constant without any clear exacerbating factors. He has not had any seizure activity reported. He is now off lacosamide. Remainder of 12-point ROS is negative.   Medications reviewed and reconciled.   Pertinent meds: Cyanocobalamin 1000 mcg IM daily Decadron 4 mg po q6h lacosamide stopped, last dose 2217 on 10/23/16 Phenytoin 300 mg po qhs  Current Meds:   Current Facility-Administered Medications:  .  0.9 %  sodium chloride infusion, , Intravenous, Continuous, [address] MD, Stopped at 10/24/16 1200 .  acetaminophen (TYLENOL) solution 650 mg, 650 mg, Oral, Q6H PRN, 650 mg at 10/24/16 1534 **OR** acetaminophen (TYLENOL) suppository 650 mg, 650 mg, Rectal, Q4H PRN, Chesley Mires, MD .  Chlorhexidine Gluconate Cloth 2 % PADS 6 each, 6 each, Topical, Daily, Chesley Mires, MD, 6 each at 10/24/16 0955 .  cyanocobalamin ((VITAMIN B-12)) injection 1,000 mcg, 1,000 mcg, Intramuscular, Daily, Rushil Sherrye Payor, MD, 1,000 mcg at 10/24/16 0953 .  dexamethasone (DECADRON) tablet 4 mg, 4 mg, Oral, Q6H, Darrel Reach, MD, 4 mg at 10/25/16 [phone] .  fentaNYL (SUBLIMAZE) injection 12.5 mcg, 12.5 mcg, Intravenous, Q2H PRN, Raylene Miyamoto, MD, 12.5 mcg at 10/23/16 1748 .  folic acid (FOLVITE) tablet 1 mg, 1 mg, Oral, Daily, Ankit Chirag Amin, MD, 1 mg at 10/24/16 0954 .  guaiFENesin-dextromethorphan (ROBITUSSIN DM) 100-10 MG/5ML syrup 5 mL, 5 mL, Oral, Q4H PRN, Gardiner Barefoot, NP, 5 mL at 10/25/16 0147 .  insulin aspart (novoLOG) injection 2-6 Units, 2-6 Units, Subcutaneous, Q4H, Corey Harold, NP, 2 Units at 10/25/16 [phone] .  labetalol (NORMODYNE,TRANDATE) injection 10 mg, 10 mg, Intravenous, Q2H PRN, Javier Glazier, MD, 10 mg at 10/21/16 0241 .  LORazepam (ATIVAN) injection 2 mg, 2 mg, Intravenous, PRN, Chesley Mires, MD, 2 mg at 10/19/16 0251 .  MEDLINE mouth rinse, 15 mL, Mouth Rinse, BID, Jose Angelo A de Larkin Ina, MD, 15 mL at 10/24/16 2200 .  nicotine (NICODERM CQ - dosed in mg/24 hours) patch 14 mg, 14 mg, Transdermal, Daily, Javier Glazier, MD, 14 mg at 10/24/16 0959 .  phenytoin (DILANTIN) ER capsule 300 mg, 300 mg, Oral, QHS, Darrel Reach, MD, 300 mg at 10/24/16 2144 .  sodium chloride flush (NS) 0.9 % injection 10-40 mL, 10-40 mL, Intracatheter, Q12H, Chesley Mires, MD, 10 mL at 10/24/16 2152 .  sodium chloride flush (NS) 0.9 % injection 10-40 mL, 10-40 mL, Intracatheter, PRN, Chesley Mires, MD .  thiamine (VITAMIN B-1) tablet 100 mg, 100 mg, Oral, Daily, Ankit Chirag Amin, MD, 100 mg at 10/24/16 0954  Objective:  Temp:  [97.9 F (36.6 C)-98.6 F (37 C)] 98.2 F (36.8 C) (03/10 0605) Pulse Rate:  [77-102] 89 (03/10 0605) Resp:  [12-23] 20 (03/10 0605) BP: (132-178)/(74-133) 159/74 (03/10 0605) SpO2:  [90 %-98 %] 92 % (03/10 0605) Weight:  [99.7 kg (219 lb 14.4 oz)] 99.7 kg (219 lb 14.4 oz) (03/10 3299)  General: WDWN Caucasian man resting quietly in bed. Alert, oriented x4. Speech  is clear without dysarthria. His voice is hoarse and hypophonic. No aphasia. Affect is restricted. Comportment is normal.  HEENT: Neck is supple without lymphadenopathy. Mucous membranes are slightly dry and the oropharynx is clear. Sclerae are anicteric. There is no conjunctival injection.  CV: Regular, no murmur. Carotid pulses are 2+ and symmetric with no bruits. Distal pulses 2+ and symmetric.  Lungs: CTAB  Extremities: No C/C/E. Neuro: MS: As noted  above.  CN: Pupils are equal and reactive from 3-->2 mm bilaterally. EOMI, no nystagmus. Facial sensation is intact to light touch. Face is symmetric at rest with normal strength and mobility. Hearing is intact to conversational voice. Voice is normal in tone and quality. Palate elevates symmetrically. Uvula is midline. Bilateral SCM and trapezii are 5/5. Tongue is midline with normal bulk and mobility.  Motor: Normal bulk, tone, and strength throughout. No pronator drift. No tremor or other abnormal movements are observed.  Sensation: Intact to light touch.  DTRs: 2+, symmetric. Toes are downgoing bilaterally. No pathological reflexes.  Coordination: Finger-to-nose is slow but is without dysmetria bilaterally.   Labs: Lab Results  Component Value Date   WBC 12.3 (H) 10/25/2016   HGB 14.2 10/25/2016   HCT 41.7 10/25/2016   PLT 199 10/25/2016   GLUCOSE 124 (H) 10/25/2016   CHOL 202 (A) 11/21/2014   TRIG 60 11/21/2014   HDL 104 (A) 11/21/2014   LDLCALC 86 11/21/2014   ALT 28 10/18/2016   AST 57 (H) 10/18/2016   NA 135 10/25/2016   K 3.5 10/25/2016   CL 98 (L) 10/25/2016   CREATININE 0.49 (L) 10/25/2016   BUN 7 10/25/2016   CO2 27 10/25/2016   TSH 5.30 12/28/2014   PSA 0.2 11/21/2014   HGBA1C 5.2 10/17/2016   CBC Latest Ref Rng & Units 10/25/2016 10/24/2016 10/23/2016  WBC 4.0 - 10.5 K/uL 12.3(H) 12.0(H) 14.0(H)  Hemoglobin 13.0 - 17.0 g/dL 14.2 13.9 14.5  Hematocrit 39.0 - 52.0 % 41.7 41.1 42.8  Platelets 150 - 400 K/uL 199 168 149(L)    Lab Results  Component Value Date   HGBA1C 5.2 10/17/2016   Lab Results  Component Value Date   ALT 28 10/18/2016   AST 57 (H) 10/18/2016   ALKPHOS 54 10/18/2016   BILITOT 0.8 10/18/2016   Mg 2.1 Phos 3.3  Radiology: There is no new neuroimaging for review.   A/P:   1. Seizure: He had focal seizures likely provoked by a large meningioma in the L temporoparietal region +/- alcohol abuse/ withdrawal. He has been seizure-free since  presentation. He is now on phenytoin monotherapy, will continue. Abstinence from alcohol will be critical for longterm seizure control. Seizure precautions.    2. Aphasia: This has resolved. This was seizure phenomenon vs Todd's phenomenon. No further issue at this time.   3. Acute encephalopathy: This has essentially resolved. Continue to optimize metabolic status as you are. Minimize the use of opiates, benzos or any medication with strong anticholinergic properties as much as possible. Optimize sleep-wake cycles as much as you can by keeping the room bright with activity during the day and dark and quiet at night.   4. Meningiomas: Appreciate neurosurgery assistance--still planning for OR next week. Change Decadron to 2 mg q6.    The patient would likely benefit from acute rehab services. Recommend consultation of PT/OT/SLP with consideration for PM&R consult as appropriate depending upon clinical progress and therapists' recommendations.   Fall risk: Risk factors for falls include use of psychotropic/sedative/hypnotic medications, polypharmacy,  encephalopathy. Continue fall precautions. Patient educated on risk of falls and use of call bell. Patient was counseled not to get up without assistance. Limit psychoactive medications and sedating medications.  Needs outpatient neurology follow-up?: He will need outpatient referral once he is discharged from this admission, to be seen 2- 4 weeks post-discharge.   This was discussed with the patient. Education was provided on the diagnosis and expected evaluation and treatment. He is in agreement with the plan as noted. He was given the opportunity to ask any questions and these were addressed to his satisfaction.   Melba Coon, MD [facility]

## 2016-10-25 NOTE — Progress Notes (Signed)
Vitals:   10/25/16 0000 10/25/16 0145 10/25/16 0400 10/25/16 0605  BP: (!) 160/89  (!) 178/80 (!) 159/74  Pulse: 94 85 80 89  Resp: (!) 22 14 (!) 21 20  Temp:    98.2 F (36.8 C)  TempSrc:    Oral  SpO2: 93% 96% 94% 92%  Weight:    99.7 kg (219 lb 14.4 oz)  Height:    6\' 1"  (1.854 m)    CBC  Recent Labs  10/24/16 0408 10/25/16 0315  WBC 12.0* 12.3*  HGB 13.9 14.2  HCT 41.1 41.7  PLT 168 199   BMET  Recent Labs  10/24/16 0408 10/25/16 0315  NA 134* 135  K 3.1* 3.5  CL 98* 98*  CO2 27 27  GLUCOSE 122* 124*  BUN 15 7  CREATININE 0.61 0.49*  CALCIUM 8.6* 8.6*    Patient resting comfortably, without complaints. Awake and alert, oriented to name and: Hospital, but not to year. Following commands. Moving all 4 extremities well.  Plan: Dr. Christella Noa indicates that he is anticipating surgery this coming week.  Hosie Spangle, MD 10/25/2016, 7:17 AM

## 2016-10-25 NOTE — Progress Notes (Addendum)
PROGRESS NOTE    Frederick Drake  NUU:[id number] DOB: 02-Aug-1961 DOA: 10/16/2016 PCP: No PCP Per Patient   Brief Narrative:  56 year old male with a history of alcohol use came to the ED after having seizures at home on 10/16/2016. Per wife patient had had 2 seizures at home. Upon arrival he had another seizure and was subsequently intubated in the ER as it was thought he was in status epilepticus. CT of the head showed 2.3 cm right frontal lobe mass with surrounding edema and a 2.7 cm left parietal lobe mass without surrounding edema. He was transferred to [facility] for further care. Neurology and neurosurgery were consulted. MRI of the brain done showed its possible meningioma. He was started on antiepileptic with plans for possible craniotomy early next week.   Assessment & Plan:   Active Problems:   Status epilepticus ([place])   Acute respiratory failure (HCC)   Seizures (HCC)   Aphasia   Meningioma (HCC)   Acute encephalopathy   Brain mass  Status epilepticus likely due to brain mass-meningioma along with possible occult withdrawal -Neurology and neurosurgery consult in -Currently  phenytoin and his seizures are controlled -Continue Decadron. Switch to oral dose on 10/23/2016. Should be ok to taper once his meningioma is taken care of.  "patient wants to leave. I have discussed with neurosurgery (dr Ronnald Ramp who is on call) and Dr Shon Hale (neurology)- ok with discharge on phenytoin and decadron. NeuroSx office to call him with appointment. I will also provide him with their office number.   Acute encephalopathy-much improved  Hoarseness - dry mouth/s/p intubation. No stridor. Supportive care.   Alcohol abuse/withdrawal -Counseled on refraining from alcohol use -Continue thiamine and folic acid  Aphasia-resolved  DVT prophylaxis: SCDs Code Status: Full Family Communication:  Patient comprehends well  Disposition Plan:  Discharge   Consultants:    Neurology  Neurosurgery  Procedures:   none  Antimicrobials:   None   Subjective:  Patient doesn't not have any complaints besides mild headache. Patient is adamant abou getting discharged today. I have discussed the case with Dr Shon Hale (neurology) and Dr Ronnald Ramp (NeuroSx, on call for Dr Lavena Stanford Cyndy Freeze) who is ok with discharging the patient at this time with Seizures meds and decadron 2mg  po q6hrs.   I have explained the risk (including deadly seizure, death), benefit and importance of following up with Neurology and Neurosurgery within one week in terms of getting his surgery. He understand the risk and benefit and is able to to say them back to me.  I also spoke with his wife who is ok with him coming home, I have advised him to avoid driving for now until advised further. Wife is aware that he needs to follow up with Neuro and Neurosurgery.   Objective: Vitals:   10/25/16 0145 10/25/16 0400 10/25/16 0605 10/25/16 0948  BP:  (!) 178/80 (!) 159/74 139/66  Pulse: 85 80 89 91  Resp: 14 (!) 21 20 20   Temp:   98.2 F (36.8 C) 98.4 F (36.9 C)  TempSrc:   Oral Oral  SpO2: 96% 94% 92% 93%  Weight:   99.7 kg (219 lb 14.4 oz)   Height:   6\' 1"  (1.854 m)     Intake/Output Summary (Last 24 hours) at 10/25/16 1249 Last data filed at 10/25/16 1121  Gross per 24 hour  Intake              250 ml  Output  475 ml  Net             -225 ml   Filed Weights   10/22/16 0400 10/24/16 0500 10/25/16 0605  Weight: 103.6 kg (228 lb 6.3 oz) 99 kg (218 lb 4.1 oz) 99.7 kg (219 lb 14.4 oz)    Examination:  General exam: Appears calm and comfortable. AAOx4  Respiratory system: Clear to auscultation. Respiratory effort normal. Cardiovascular system: S1 & S2 heard, RRR. No JVD, murmurs, rubs, gallops or clicks. No pedal edema. Gastrointestinal system: Abdomen is nondistended, soft and nontender. No organomegaly or masses felt. Normal bowel sounds heard. Central nervous system:  Alert and oriented. No focal neurological deficits. Extremities: Symmetric 5 x 5 power. Skin: No rashes, lesions or ulcers Psychiatry: Judgement and insight appear normal. Mood & affect appropriate.     Data Reviewed:   CBC:  Recent Labs Lab 10/21/16 0400 10/22/16 0400 10/23/16 0317 10/24/16 0408 10/25/16 0315  WBC 8.1 9.2 14.0* 12.0* 12.3*  HGB 13.6 14.0 14.5 13.9 14.2  HCT 41.3 42.2 42.8 41.1 41.7  MCV 99.0 98.4 96.6 96.5 96.1  PLT 83* 96* 149* 168 408   Basic Metabolic Panel:  Recent Labs Lab 10/21/16 0400 10/22/16 0400 10/23/16 0317 10/24/16 0408 10/25/16 0315  NA 136 136 135 134* 135  K 3.3* 3.9 3.6 3.1* 3.5  CL 100* 99* 99* 98* 98*  CO2 29 28 27 27 27   GLUCOSE 150* 131* 115* 122* 124*  BUN 11 14 20 15 7   CREATININE 0.54* 0.50* 0.63 0.61 0.49*  CALCIUM 8.1* 8.6* 8.5* 8.6* 8.6*  MG 1.7 2.1 2.0 1.9 2.1  PHOS 3.2 4.2 4.2 3.6 3.3   GFR: Estimated Creatinine Clearance: 129.6 mL/min (by C-G formula based on SCr of 0.49 mg/dL (L)). Liver Function Tests: No results for input(s): AST, ALT, ALKPHOS, BILITOT, PROT, ALBUMIN in the last 168 hours. No results for input(s): LIPASE, AMYLASE in the last 168 hours. No results for input(s): AMMONIA in the last 168 hours. Coagulation Profile: No results for input(s): INR, PROTIME in the last 168 hours. Cardiac Enzymes: No results for input(s): CKTOTAL, CKMB, CKMBINDEX, TROPONINI in the last 168 hours. BNP (last 3 results) No results for input(s): PROBNP in the last 8760 hours. HbA1C: No results for input(s): HGBA1C in the last 72 hours. CBG:  Recent Labs Lab 10/24/16 1927 10/24/16 2331 10/25/16 0337 10/25/16 0746 10/25/16 1110  GLUCAP 115* 101* 122* 93 113*   Lipid Profile: No results for input(s): CHOL, HDL, LDLCALC, TRIG, CHOLHDL, LDLDIRECT in the last 72 hours. Thyroid Function Tests: No results for input(s): TSH, T4TOTAL, FREET4, T3FREE, THYROIDAB in the last 72 hours. Anemia Panel: No results for  input(s): VITAMINB12, FOLATE, FERRITIN, TIBC, IRON, RETICCTPCT in the last 72 hours. Sepsis Labs: No results for input(s): PROCALCITON, LATICACIDVEN in the last 168 hours.  Recent Results (from the past 240 hour(s))  MRSA PCR Screening     Status: None   Collection Time: 10/17/16 12:14 AM  Result Value Ref Range Status   MRSA by PCR NEGATIVE NEGATIVE Final    Comment:        The GeneXpert MRSA Assay (FDA approved for NASAL specimens only), is one component of a comprehensive MRSA colonization surveillance program. It is not intended to diagnose MRSA infection nor to guide or monitor treatment for MRSA infections.   Surgical PCR screen     Status: None   Collection Time: 10/23/16  8:25 AM  Result Value Ref Range Status   MRSA, PCR  NEGATIVE NEGATIVE Final   Staphylococcus aureus NEGATIVE NEGATIVE Final    Comment:        The Xpert SA Assay (FDA approved for NASAL specimens in patients over 51 years of age), is one component of a comprehensive surveillance program.  Test performance has been validated by [facility] for patients greater than or equal to 85 year old. It is not intended to diagnose infection nor to guide or monitor treatment.          Radiology Studies: No results found.      Scheduled Meds: . Chlorhexidine Gluconate Cloth  6 each Topical Daily  . cyanocobalamin  1,000 mcg Intramuscular Daily  . dexamethasone  2 mg Oral J6R  . folic acid  1 mg Oral Daily  . insulin aspart  2-6 Units Subcutaneous Q4H  . mouth rinse  15 mL Mouth Rinse BID  . nicotine  14 mg Transdermal Daily  . phenytoin  300 mg Oral QHS  . sodium chloride flush  10-40 mL Intracatheter Q12H  . thiamine  100 mg Oral Daily   Continuous Infusions: . sodium chloride Stopped (10/24/16 1200)     LOS: 9 days    Time spent: 35 mins     Kymberly Blomberg Arsenio Loader, MD [facility] Pager [phone]   If 7PM-7AM, please contact night-coverage [email or web address] Password  [facility] 10/25/2016, 12:49 PM

## 2016-10-25 NOTE — Discharge Summary (Signed)
Physician Discharge Summary  Frederick Drake NWG:[id number] DOB: [birth year] DOA: 10/16/2016  PCP: No PCP Per Patient  Admit date: 10/16/2016 Discharge date: 10/25/2016  Admitted From: Home Disposition:  Home  Recommendations for Outpatient Follow-up:  1. Follow up with PCP in 1-2 weeks 2. Follow up with Dr Christella Noa in next 3 days. Number provided below.  3. Follow up with Dr Oster/Neurology in 7 days.  4. Avoid driving  5. Refrain from using alcohol  6. Take Decadron as prescribed and do not stop it abruptly. When you follow up within a week, you will be further instructed what to do.  7. Take Phenytoin as prescribed   Home Health: No Equipment/Devices: No Discharge Condition: Stable CODE STATUS: Full  Diet recommendation: Heart Healthy / Carb Modified   Brief/Interim Summary: 56 year old male with a history of alcohol use came to the ED after having seizures at home on 10/16/2016. Per wife patient had had 2 seizures at home. Upon arrival he had another seizure and was subsequently intubated in the ER as it was thought he was in status epilepticus. CT of the head showed 2.3 cm right frontal lobe mass with surrounding edema and a 2.7 cm left parietal lobe mass without surrounding edema. He was transferred to [facility] for further care. Neurology and neurosurgery were consulted. MRI of the brain done showed its possible meningioma. He was started on antiepileptic with plans for possible craniotomy early next week.  Meanwhile today patient was adamant about leaving. I spoke with Neurology and Neurosurgery who were ok with discharging the patient on Decadron/Phenytoin with outpatient instructions to follow up in one week.  I spoke with the patient's wife as well and she understands.  At this time he is medically stable. I will discharge him. I have discussed it in detail risk and benefit of leaving and importance on management of seizure, craniotomy for meningioma and alcohol withdrawal. He  understands and comprehends well.   Discharge Diagnoses:  Active Problems:   Status epilepticus ([place])   Acute respiratory failure (HCC)   Seizures (HCC)   Aphasia   Meningioma (HCC)   Acute encephalopathy   Brain mass  Status epilepticus likely due to brain mass-meningioma along with possible occult withdrawal - resolved  -Neurology and neurosurgery following -cont phenytoin and his seizures are controlled -Continue Decadron and taper. Switch to oral dose on 10/23/2016 -. I have discussed with neurosurgery (dr Ronnald Ramp who is on call) and Dr Shon Hale (neurology)- ok with discharge on phenytoin and decadron. NeuroSx office to call him with appointment. I will also provide him with their office number.   Acute encephalopathy - resolved   Cough/Horaseness - likely from URI/s/p intubation.   Alcohol abuse/withdrawal -Counseled on refraining from alcohol use -Continue thiamine and folic acid  Aphasia-resolved   Discharge Instructions   Allergies as of 10/25/2016      Reactions   Penicillins Rash      Medication List    TAKE these medications   dexamethasone 2 MG tablet Commonly known as:  DECADRON Take 1 tablet (2 mg total) by mouth every 6 (six) hours.   finasteride 5 MG tablet Commonly known as:  PROSCAR Take 5 mg by mouth daily.   folic acid 1 MG tablet Commonly known as:  FOLVITE Take 1 tablet (1 mg total) by mouth daily. Start taking on:  10/26/2016   gabapentin 300 MG capsule Commonly known as:  NEURONTIN Take 300 mg by mouth 3 (three) times daily.   lisinopril 20  MG tablet Commonly known as:  PRINIVIL,ZESTRIL Take 20 mg by mouth daily.   metoprolol 50 MG tablet Commonly known as:  LOPRESSOR Take 50 mg by mouth 2 (two) times daily.   phenytoin 300 MG ER capsule Commonly known as:  DILANTIN Take 1 capsule (300 mg total) by mouth at bedtime.   silodosin 8 MG Caps capsule Commonly known as:  RAPAFLO Take 8 mg by mouth daily with breakfast.   thiamine  100 MG tablet Take 1 tablet (100 mg total) by mouth daily. Start taking on:  10/26/2016      Follow-up Information    CABBELL,KYLE L, MD. Schedule an appointment as soon as possible for a visit in 3 day(s).   Specialty:  Neurosurgery Contact information: [address] [place] [address] [phone]        Darrel Reach, MD. Schedule an appointment as soon as possible for a visit in 1 week(s).   Specialty:  Neurology Contact information: [address] [place] [address] [phone]          Allergies  Allergen Reactions  . Penicillins Rash    Consultations:  Neurology  Neurosurgery, [place] [facility]   Procedures/Studies: Dg Chest 1 View  Result Date: 10/16/2016 CLINICAL DATA:  Endotracheal tube placement, initial encounter EXAM: CHEST 1 VIEW COMPARISON:  10/16/2016 CXR at 1518 hours FINDINGS: New endotracheal tube tip is seen 5.5 cm above the carina. Gastric tube extends below the left hemidiaphragm. Borderline cardiomegaly with atelectasis at the left lung base. No pulmonary edema, pneumothorax nor appreciable pleural effusion. IMPRESSION: Satisfactory appearance support line and tube positions. No active disease. Electronically Signed   By: Ashley Royalty M.D.   On: 10/16/2016 17:40   Dg Chest 1 View  Result Date: 10/16/2016 CLINICAL DATA:  Recent seizure activity EXAM: CHEST 1 VIEW COMPARISON:  04/19/2010 FINDINGS: The heart size and mediastinal contours are within normal limits. Both lungs are clear. The visualized skeletal structures are unremarkable. IMPRESSION: No active disease. Electronically Signed   By: Inez Catalina M.D.   On: 10/16/2016 15:38   Ct Head Wo Contrast  Result Date: 10/16/2016 CLINICAL DATA:  Seizures. EXAM: CT HEAD WITHOUT CONTRAST TECHNIQUE: Contiguous axial images were obtained from the base of the skull through the vertex without intravenous contrast. COMPARISON:  None. FINDINGS: Brain: 2.3 cm rounded  hyperdense mass is noted in right frontal lobe surrounded by white matter edema. This results in 6 mm of right to left midline shift anteriorly. Ventricular size is within normal limits. There is no evidence of hemorrhage or acute infarction. There appears to be in other mass measuring 3.7 x 2.6 x 2.0 cm in the left parietal cortex just above the left tentorium ; this may simply represent meningioma. Vascular: No hyperdense vessel or unexpected calcification. Skull: Normal. Negative for fracture or focal lesion. Sinuses/Orbits: No acute finding. Other: None. IMPRESSION: 2.3 cm rounded hyperdense mass is noted in right frontal lobe with surrounding white matter edema concerning for neoplasm or malignancy. Also noted is 2.7 x 2.6 x 2.0 cm mass in left parietal cortex above left tentorium without surrounding edema. MRI of the brain with and without gadolinium administration is recommended for further evaluation. Electronically Signed   By: Marijo Conception, M.D.   On: 10/16/2016 16:34   Mr Jeri Cos MG Contrast  Result Date: 10/17/2016 CLINICAL DATA:  Multiple seizures, follow-up brain masses. EXAM: MRI HEAD WITHOUT AND WITH CONTRAST TECHNIQUE: Multiplanar, multiecho pulse sequences of  the brain and surrounding structures were obtained without and with intravenous contrast. CONTRAST:  71mL MULTIHANCE GADOBENATE DIMEGLUMINE 529 MG/ML IV SOLN COMPARISON:  CT HEAD October 16, 2016 at 1611 hours FINDINGS: INTRACRANIAL CONTENTS: Faint reduced diffusion LEFT hippocampus, to lesser extent cingulate gyrus with slightly decreased ADC values. Patchy susceptibility artifact within LEFT temporal occipital extra-axial 3.2 x 5.6 x 4.8 cm homogeneously enhancing dural-based mass consistent with meningioma. Minimal surrounding FLAIR T2 hyperintense are seen. Adjacent Dural venous sinus invasion, tumor within the LEFT sigmoid sinus and along the cerebellar tentorium. Similar imaging characteristics 3.2 x 3.4 x 1.4 cm (AP by transverse by  CC) arising from the RIGHT anterior skullbase associated with hyperostosis. Extensive surrounding FLAIR T2 hyperintense vasogenic edema in regional mass effect, partially effacing the RIGHT frontal horn of the lateral ventricle. 3 mm RIGHT to LEFT midline shift. Diffuse reactive mild smooth dural enhancement. No abnormal intraparenchymal enhancement. Ventricles and sulci are overall normal for patient's age. No abnormal extra-axial fluid collections. VASCULAR: Normal major intracranial vascular flow voids present at skull base. Intermediate to bright FLAIR signal and intermediate T2 signal within LEFT sigmoid sinus to internal jugular vein. SKULL AND UPPER CERVICAL SPINE: No abnormal sellar expansion. No suspicious calvarial bone marrow signal. Craniocervical junction maintained. Prominent RIGHT frontal scalp vessel. SINUSES/ORBITS: Trace paranasal sinus mucosal thickening. Trace mastoid effusions. Oropharyngeal air-fluid level, suspected life support line placement. The included ocular globes and orbital contents are non-suspicious. OTHER: None. IMPRESSION: 3.2 x 5.6 x 4.8 cm LEFT temporal occipital meningioma with local invasion including LEFT sigmoid dural venous sinus invasion with slow flow versus thrombosis. 3.2 x 3.4 x 1.4 cm RIGHT anterior cranial fossa meningioma with extensive vasogenic edema, potential parenchymal invasion. Abnormal signal LEFT hippocampus and to lesser extent cingulate gyrus compatible recent seizures/postictal state. No suspicious signal to suggest superimposed to suggest viral infection. Electronically Signed   By: Elon Alas M.D.   On: 10/17/2016 00:54   Dg Chest Port 1 View  Result Date: 10/20/2016 CLINICAL DATA:  Respiratory failure. EXAM: PORTABLE CHEST 1 VIEW COMPARISON:  10/19/2016 FINDINGS: Endotracheal tube, NG tube and PICC catheter appear in good position. Heart size and pulmonary vascularity are normal. Markings are accentuated at the right lung base due to a  shallow inspiration. No infiltrate or effusion. IMPRESSION: Accentuated markings at the right lung base due to a shallow inspiration. Electronically Signed   By: Lorriane Shire M.D.   On: 10/20/2016 07:46   Dg Chest Port 1 View  Result Date: 10/19/2016 CLINICAL DATA:  Acute respiratory failure. EXAM: PORTABLE CHEST 1 VIEW COMPARISON:  Radiograph October 17, 2016. FINDINGS: The heart size and mediastinal contours are within normal limits. Both lungs are clear. No pneumothorax or pleural effusion is noted. Endotracheal and nasogastric tubes are again noted and unchanged. The visualized skeletal structures are unremarkable. IMPRESSION: Stable support apparatus. No acute cardiopulmonary abnormality seen. Electronically Signed   By: Marijo Conception, M.D.   On: 10/19/2016 08:13   Dg Chest Port 1 View  Result Date: 10/17/2016 CLINICAL DATA:  Intubation . EXAM: PORTABLE CHEST 1 VIEW COMPARISON:  10/16/2016. FINDINGS: Endotracheal tube and NG tube in stable position. Cardiomegaly with mild bilateral interstitial prominence and small bilateral pleural effusions consistent CHF. No pneumothorax . IMPRESSION: 1. Lines and tubes stable position. 2. Cardiomegaly with pulmonary interstitial prominence and small bilateral pleural effusions consistent CHF noted on today's exam . Electronically Signed   By: [place]   On: 10/17/2016 07:40   Dg Abd Portable  1v  Result Date: 10/20/2016 CLINICAL DATA:  Orogastric tube.  Respiratory failure. EXAM: PORTABLE ABDOMEN - 1 VIEW COMPARISON:  10/16/2016 FINDINGS: NG tube tip remains in the antrum of the stomach. Mild diffuse colonic distention is present. There is no obvious free intraperitoneal gas. There is no disproportionate dilatation of small bowel. IMPRESSION: Stable mild colonic distention. There is no evidence of small-bowel obstruction. NG tube is stable in position in the stomach. Electronically Signed   By: Marybelle Killings M.D.   On: 10/20/2016 07:45   Dg Abd Portable 1  View  Result Date: 10/16/2016 CLINICAL DATA:  Orogastric tube placement EXAM: PORTABLE ABDOMEN - 1 VIEW COMPARISON:  Portable exam 1714 hours compared to CT abdomen and pelvis of 11/14/2014 FINDINGS: Tip of orogastric tube projects over expected position of duodenal bulb. Normal bowel gas pattern. Lung bases clear. Degenerative disc disease changes thoracolumbar spine with dextroconvex lumbar scoliosis. IMPRESSION: Tip of orogastric tube projects over expected position of duodenal bulb. Electronically Signed   By: Lavonia Dana M.D.   On: 10/16/2016 17:42      Subjective:   Discharge Exam: Vitals:   10/25/16 0948 10/25/16 1403  BP: 139/66 140/77  Pulse: 91 95  Resp: 20 20  Temp: 98.4 F (36.9 C) 98.6 F (37 C)   Vitals:   10/25/16 0400 10/25/16 0605 10/25/16 0948 10/25/16 1403  BP: (!) 178/80 (!) 159/74 139/66 140/77  Pulse: 80 89 91 95  Resp: (!) 21 20 20 20   Temp:  98.2 F (36.8 C) 98.4 F (36.9 C) 98.6 F (37 C)  TempSrc:  Oral Oral Oral  SpO2: 94% 92% 93% 96%  Weight:  99.7 kg (219 lb 14.4 oz)    Height:  6\' 1"  (1.854 m)      General: Pt is alert, awake, not in acute distress; AAOx4 with good judgement.  Cardiovascular: RRR, S1/S2 +, no rubs, no gallops Respiratory: CTA bilaterally, no wheezing, no rhonchi Abdominal: Soft, NT, ND, bowel sounds + Extremities: no edema, no cyanosis    The results of significant diagnostics from this hospitalization (including imaging, microbiology, ancillary and laboratory) are listed below for reference.     Microbiology: Recent Results (from the past 240 hour(s))  MRSA PCR Screening     Status: None   Collection Time: 10/17/16 12:14 AM  Result Value Ref Range Status   MRSA by PCR NEGATIVE NEGATIVE Final    Comment:        The GeneXpert MRSA Assay (FDA approved for NASAL specimens only), is one component of a comprehensive MRSA colonization surveillance program. It is not intended to diagnose MRSA infection nor to guide  or monitor treatment for MRSA infections.   Surgical PCR screen     Status: None   Collection Time: 10/23/16  8:25 AM  Result Value Ref Range Status   MRSA, PCR NEGATIVE NEGATIVE Final   Staphylococcus aureus NEGATIVE NEGATIVE Final    Comment:        The Xpert SA Assay (FDA approved for NASAL specimens in patients over 39 years of age), is one component of a comprehensive surveillance program.  Test performance has been validated by [facility] for patients greater than or equal to 61 year old. It is not intended to diagnose infection nor to guide or monitor treatment.      Labs: BNP (last 3 results) No results for input(s): BNP in the last 8760 hours. Basic Metabolic Panel:  Recent Labs Lab 10/21/16 0400 10/22/16 0400 10/23/16 0317 10/24/16  0408 10/25/16 0315  NA 136 136 135 134* 135  K 3.3* 3.9 3.6 3.1* 3.5  CL 100* 99* 99* 98* 98*  CO2 29 28 27 27 27   GLUCOSE 150* 131* 115* 122* 124*  BUN 11 14 20 15 7   CREATININE 0.54* 0.50* 0.63 0.61 0.49*  CALCIUM 8.1* 8.6* 8.5* 8.6* 8.6*  MG 1.7 2.1 2.0 1.9 2.1  PHOS 3.2 4.2 4.2 3.6 3.3   Liver Function Tests: No results for input(s): AST, ALT, ALKPHOS, BILITOT, PROT, ALBUMIN in the last 168 hours. No results for input(s): LIPASE, AMYLASE in the last 168 hours. No results for input(s): AMMONIA in the last 168 hours. CBC:  Recent Labs Lab 10/21/16 0400 10/22/16 0400 10/23/16 0317 10/24/16 0408 10/25/16 0315  WBC 8.1 9.2 14.0* 12.0* 12.3*  HGB 13.6 14.0 14.5 13.9 14.2  HCT 41.3 42.2 42.8 41.1 41.7  MCV 99.0 98.4 96.6 96.5 96.1  PLT 83* 96* 149* 168 199   Cardiac Enzymes: No results for input(s): CKTOTAL, CKMB, CKMBINDEX, TROPONINI in the last 168 hours. BNP: Invalid input(s): POCBNP CBG:  Recent Labs Lab 10/24/16 1927 10/24/16 2331 10/25/16 0337 10/25/16 0746 10/25/16 1110  GLUCAP 115* 101* 122* 93 113*   D-Dimer No results for input(s): DDIMER in the last 72 hours. Hgb A1c No results for  input(s): HGBA1C in the last 72 hours. Lipid Profile No results for input(s): CHOL, HDL, LDLCALC, TRIG, CHOLHDL, LDLDIRECT in the last 72 hours. Thyroid function studies No results for input(s): TSH, T4TOTAL, T3FREE, THYROIDAB in the last 72 hours.  Invalid input(s): FREET3 Anemia work up No results for input(s): VITAMINB12, FOLATE, FERRITIN, TIBC, IRON, RETICCTPCT in the last 72 hours. Urinalysis    Component Value Date/Time   COLORURINE YELLOW (A) 10/16/2016 1559   APPEARANCEUR HAZY (A) 10/16/2016 1559   APPEARANCEUR Clear 03/25/2014 1349   LABSPEC 1.012 10/16/2016 1559   LABSPEC 1.027 03/25/2014 1349   PHURINE 5.0 10/16/2016 1559   GLUCOSEU 150 (A) 10/16/2016 1559   GLUCOSEU Negative 03/25/2014 1349   HGBUR LARGE (A) 10/16/2016 1559   BILIRUBINUR NEGATIVE 10/16/2016 1559   BILIRUBINUR Negative 03/25/2014 1349   KETONESUR 20 (A) 10/16/2016 1559   PROTEINUR 100 (A) 10/16/2016 1559   NITRITE NEGATIVE 10/16/2016 1559   LEUKOCYTESUR NEGATIVE 10/16/2016 1559   LEUKOCYTESUR Negative 03/25/2014 1349   Sepsis Labs Invalid input(s): PROCALCITONIN,  WBC,  LACTICIDVEN Microbiology Recent Results (from the past 240 hour(s))  MRSA PCR Screening     Status: None   Collection Time: 10/17/16 12:14 AM  Result Value Ref Range Status   MRSA by PCR NEGATIVE NEGATIVE Final    Comment:        The GeneXpert MRSA Assay (FDA approved for NASAL specimens only), is one component of a comprehensive MRSA colonization surveillance program. It is not intended to diagnose MRSA infection nor to guide or monitor treatment for MRSA infections.   Surgical PCR screen     Status: None   Collection Time: 10/23/16  8:25 AM  Result Value Ref Range Status   MRSA, PCR NEGATIVE NEGATIVE Final   Staphylococcus aureus NEGATIVE NEGATIVE Final    Comment:        The Xpert SA Assay (FDA approved for NASAL specimens in patients over 28 years of age), is one component of a comprehensive  surveillance program.  Test performance has been validated by [facility] for patients greater than or equal to 75 year old. It is not intended to diagnose infection nor to guide or  monitor treatment.      Time coordinating discharge: Over 30 minutes  SIGNED:   Damita Lack, MD  [facility] 10/25/2016, 2:41 PM Pager   If 7PM-7AM, please contact night-coverage [email or web address] Password [email or web address]

## 2016-10-25 NOTE — Progress Notes (Signed)
Dr Reesa Chew to call Rx's into [facility] on [address], [place].  Daughter here to take pt home, She understands that pt isn't suppose to drive and she said that pt doesn't have a vehicle to drive.

## 2016-10-25 NOTE — Progress Notes (Signed)
Pt says he is going home. Instr pt that he needed to stay in hosp til his surgery next wk.  He says he wants to leave. Told him that I would notify Md. Pt has poor judgement/poor safety understanding.

## 2016-10-28 ENCOUNTER — Other Ambulatory Visit: Payer: Self-pay | Admitting: Neurosurgery

## 2016-11-04 ENCOUNTER — Encounter (HOSPITAL_COMMUNITY)
Admission: RE | Admit: 2016-11-04 | Discharge: 2016-11-04 | Disposition: A | Discharge status: Home / Self Care | Payer: Self-pay | Source: Ambulatory Visit | Attending: Neurosurgery | Admitting: Neurosurgery

## 2016-11-04 ENCOUNTER — Encounter (HOSPITAL_COMMUNITY): Payer: Self-pay | Admitting: *Deleted

## 2016-11-04 LAB — COMPREHENSIVE METABOLIC PANEL
ALBUMIN: 3.1 g/dL — AB (ref 3.5–5.0)
ALK PHOS: 77 U/L (ref 38–126)
ALT: 51 U/L (ref 17–63)
AST: 35 U/L (ref 15–41)
Anion gap: 15 (ref 5–15)
BUN: 8 mg/dL (ref 6–20)
CALCIUM: 9.2 mg/dL (ref 8.9–10.3)
CO2: 21 mmol/L — AB (ref 22–32)
CREATININE: 0.8 mg/dL (ref 0.61–1.24)
Chloride: 97 mmol/L — ABNORMAL LOW (ref 101–111)
GFR calc non Af Amer: >60 mL/min (ref >60)
GLUCOSE: 181 mg/dL — AB (ref 65–99)
Potassium: 3.4 mmol/L — ABNORMAL LOW (ref 3.5–5.1)
Sodium: 133 mmol/L — ABNORMAL LOW (ref 135–145)
Total Bilirubin: 0.5 mg/dL (ref 0.3–1.2)
Total Protein: 6.9 g/dL (ref 6.5–8.1)

## 2016-11-04 LAB — CBC
HCT: 41.1 % (ref 39.0–52.0)
Hemoglobin: 13.8 g/dL (ref 13.0–17.0)
MCH: 32.1 pg (ref 26.0–34.0)
MCHC: 33.6 g/dL (ref 30.0–36.0)
MCV: 95.6 fL (ref 78.0–100.0)
PLATELETS: 186 10*3/uL (ref 150–400)
RBC: 4.3 MIL/uL (ref 4.22–5.81)
RDW: 12.5 % (ref 11.5–15.5)
WBC: 13.8 10*3/uL — ABNORMAL HIGH (ref 4.0–10.5)

## 2016-11-04 LAB — ABO/RH: ABO/RH(D): O POS

## 2016-11-04 NOTE — Progress Notes (Signed)
Denies having a PCP  Denies ever seeing a cardiologist. Denies any chest pain. Denies Ever having a stress test, echo, or card cath.

## 2016-11-04 NOTE — Pre-Procedure Instructions (Signed)
ANOTHONY BURSCH  11/04/2016      [place], [place], [place] [address] Phone: [phone] Fax: [place] [facility] [address] [place] [address] Phone: [phone] Fax: [phone]  MEDICAL [address] [place], [place] - [place] [address] Phone: [phone] Fax: [phone]  Medication Mgmt. [place], [place] [address] [place] [address] Phone: [phone] Fax: [phone]    Your procedure is scheduled on March 22.  Report to [facility] Admitting at 100pm.  Call this number if you have problems the morning of surgery:  [phone]   Remember:  Do not eat food or drink liquids after midnight.  Take these medicines the morning of surgery with A SIP OF WATER Proscar, Gabapentin (Neurontin), Metoprolol (Lopressor), Silodosin (Rapaflo)  Stop taking aspirin, Ibuprofen, Advil, Motrin, Aleve, Vitamins, Herbal medications, Fish Oil, BC', Goody's   Do not wear jewelry, make-up or nail polish.  Do not wear lotions, powders, or perfumes, or deoderant.  Do not shave 48 hours prior to surgery.  Men may shave face and neck.  Do not bring valuables to the hospital.  [facility] is not responsible for any belongings or valuables.  Contacts, dentures or bridgework may not be worn into surgery.  Leave your suitcase in the car.  After surgery it may be brought to your room.  For patients admitted to the hospital, discharge time will be determined by your treatment team.  Patients discharged the day of surgery will not be allowed to drive home.   Special instructions:  [place] - Preparing for Surgery  Before surgery, you can play an important role.  Because skin is not sterile, your skin needs to be as free of germs as possible.  You can reduce the number of  germs on you skin by washing with CHG (chlorahexidine gluconate) soap before surgery.  CHG is an antiseptic cleaner which kills germs and bonds with the skin to continue killing germs even after washing.  Please DO NOT use if you have an allergy to CHG or antibacterial soaps.  If your skin becomes reddened/irritated stop using the CHG and inform your nurse when you arrive at Short Stay.  Do not shave (including legs and underarms) for at least 48 hours prior to the first CHG shower.  You may shave your face.  Please follow these instructions carefully:   1.  Shower with CHG Soap the night before surgery and the   morning of Surgery.  2.  If you choose to wash your hair, wash your hair first as usual with your   normal shampoo.  3.  After you shampoo, rinse your hair and body thoroughly to remove the  Shampoo.  4.  Use CHG as you would any other liquid soap.  You can apply chg directly  to the skin and wash gently with scrungie or a clean washcloth.  5.  Apply the CHG Soap to your body ONLY FROM THE NECK DOWN.    Do not use on open wounds or open sores.  Avoid contact with your eyes,  ears, mouth and genitals (private parts).  Wash genitals (private parts)  with your normal soap.  6.  Wash thoroughly, paying special attention to the area where your surgery will be performed.  7.  Thoroughly rinse your body with warm water from the neck down.  8.  DO NOT shower/wash with your normal soap after using and rinsing off  the CHG Soap.  9.  Pat yourself dry with a clean towel.            10.  Wear clean pajamas.            11.  Place clean sheets on your bed the night of your first shower and do not sleep with pets.  Day of Surgery  Do not apply any lotions/deoderants the morning of surgery.  Please wear clean clothes to the hospital/surgery center.     Please read over the following fact sheets that you were given. Pain Booklet, Coughing and Deep Breathing and Surgical Site Infection  Prevention

## 2016-11-05 NOTE — Progress Notes (Signed)
Anesthesia Chart Review: Patient is a 56 year old male scheduled for left parietal craniotomy for tumor resection on 11/06/16 (1500) by Dr. Christella Noa. DX: Meningioma.  Anesthesia was not consulted about this patient while he was at PAT.  History includes smoking, HTN, neuropathy (BLE), headaches, seizures (off meds '16; recurrent seizures '17), hernia repair. Alcohol use is documented as 12 cans of beer per day. ED visit 07/20/16 for back pain, hematuria, and recent history of seizure. Patient would not consent to further work-up in the ED and only agreed to pain medication and out-patient follow-up. [facility] ED visit 10/16/16 for seizure X 2 at home and again in the ambulance, described as generalized tonic-clonic seizures. He was admitted with status epilepticus, acidemia, right frontal lobe mass with surrounding edema, and alcohol use disorder (alcohol, ethyl < 5, UDS + canabinoid). There is concern for airway protection, and he was intubated. He developed SVT during induction. He was transferred to [facility]. PCCM, neurology and neurosurgery consulted. Brain MRI showed two large meningiomas. He was started on Keppra (changed to Dilantin) and Decadron. He had some aphasia after extubation that resolved. Dr. Christella Noa recommended meningioma resection once medically stable--this apparently was planned while patient was still hospitalized, but he was adamant about going home. Patient discharged 10/25/16.  No PCP. No cardiologist. Denied having prior stress, echo, or cardiac cath. Denied CP.   Meds include Decadron, finasteride, folate acid, Neurontin, lisinopril, metoprolol, Dilantin, Flomax, thiamine.  BP (!) 148/73   Pulse 80   Temp 36.7 C   Resp 20   Ht 6' (1.829 m)   Wt 219 lb 4.8 oz (99.5 kg)   SpO2 97%   BMI 29.74 kg/m   EKG 10/16/16: SVT at 172 bpm, anteroseptal infarct (age undetermined), ST depression, probably rate related. (ED notes indicate SVT occurred during RSI/intubation. I did not find additional  input about this in review of multiple notes.)  MRI brain 10/17/16: IMPRESSION: - 3.2 x 5.6 x 4.8 cm LEFT temporal occipital meningioma with local invasion including LEFT sigmoid dural venous sinus invasion with slow flow versus thrombosis. - 3.2 x 3.4 x 1.4 cm RIGHT anterior cranial fossa meningioma with extensive vasogenic edema, potential parenchymal invasion. - Abnormal signal LEFT hippocampus and to lesser extent cingulate gyrus compatible recent seizures/postictal state. No suspicious signal to suggest superimposed to suggest viral infection.  EEG 10/18/16: Impression: This is an abnormal EEGdue to the presence of generalized polymorphic delta slowing, suggesting severe encephalopathy, but medication effect could not be excluded.No epileptiform discharges or seizures were present.  1V CXR 10/20/16: FINDINGS: - Endotracheal tube, NG tube and PICC catheter appear in good position. Heart size and pulmonary vascularity are normal. - Markings are accentuated at the right lung base due to a shallow inspiration. No infiltrate or effusion. IMPRESSION: - Accentuated markings at the right lung base due to a shallow inspiration.  Preoperative labs noted. Glucose 181. Cr 0.80. AST 35, ALT 51. WBC 13.8. H/H 13.8/41.1, PLT 186. T&S done.   I was not asked to evaluate Mr. Bachmeier. By notes, he does not have a PCP, although he is on several medications including b-blocker. He did have recent SVT was in the setting of status epilepticus with underlying alcohol use disorder. He has been diagnosed with two meningiomas which are felt to be contributing to seizures, so resection has been recommended. Discussed with anesthesiologist Dr. Kalman Shan. Recommend to repeat EKG on arrival. I will also order a CBG on arrival since glucose was > 150, and he is  on steroid therapy. Further anesthesia evaluation on the day of surgery to determine the definitive anesthesia plan.  George Hugh [facility] Short Stay  Center/Anesthesiology Phone [phone] 11/05/2016 12:11 PM

## 2016-11-06 ENCOUNTER — Inpatient Hospital Stay (HOSPITAL_COMMUNITY): Payer: Self-pay | Admitting: Vascular Surgery

## 2016-11-06 ENCOUNTER — Inpatient Hospital Stay (HOSPITAL_COMMUNITY): Payer: Self-pay

## 2016-11-06 ENCOUNTER — Encounter (HOSPITAL_COMMUNITY): Payer: Self-pay | Admitting: *Deleted

## 2016-11-06 ENCOUNTER — Encounter (HOSPITAL_COMMUNITY)
Admission: RE | Disposition: A | Discharge status: Home / Self Care | Payer: Self-pay | Source: Ambulatory Visit | Attending: Neurosurgery

## 2016-11-06 ENCOUNTER — Inpatient Hospital Stay (HOSPITAL_COMMUNITY)
Admission: RE | Admit: 2016-11-06 | Discharge: 2016-11-08 | DRG: 027 | Disposition: A | Discharge status: Home / Self Care | Payer: Self-pay | Source: Ambulatory Visit | Attending: Neurosurgery | Admitting: Neurosurgery

## 2016-11-06 DIAGNOSIS — D329 Benign neoplasm of meninges, unspecified: Secondary | ICD-10-CM | POA: Diagnosis present

## 2016-11-06 DIAGNOSIS — D496 Neoplasm of unspecified behavior of brain: Secondary | ICD-10-CM | POA: Diagnosis present

## 2016-11-06 DIAGNOSIS — G629 Polyneuropathy, unspecified: Secondary | ICD-10-CM | POA: Diagnosis present

## 2016-11-06 DIAGNOSIS — F17210 Nicotine dependence, cigarettes, uncomplicated: Secondary | ICD-10-CM | POA: Diagnosis present

## 2016-11-06 DIAGNOSIS — Z880 Allergy status to penicillin: Secondary | ICD-10-CM

## 2016-11-06 DIAGNOSIS — Z452 Encounter for adjustment and management of vascular access device: Secondary | ICD-10-CM

## 2016-11-06 DIAGNOSIS — I10 Essential (primary) hypertension: Secondary | ICD-10-CM | POA: Diagnosis present

## 2016-11-06 DIAGNOSIS — D320 Benign neoplasm of cerebral meninges: Principal | ICD-10-CM | POA: Diagnosis present

## 2016-11-06 HISTORY — DX: Headache: R51

## 2016-11-06 HISTORY — DX: Headache, unspecified: R51.9

## 2016-11-06 HISTORY — PX: CRANIOTOMY: SHX93

## 2016-11-06 LAB — PREPARE RBC (CROSSMATCH)

## 2016-11-06 LAB — GLUCOSE, CAPILLARY: GLUCOSE-CAPILLARY: 85 mg/dL (ref 65–99)

## 2016-11-06 SURGERY — CRANIOTOMY TUMOR EXCISION
Anesthesia: General | Site: Head | Laterality: Left

## 2016-11-06 MED ORDER — LACTATED RINGERS IV SOLN
INTRAVENOUS | Status: DC | PRN
  Administered 2016-11-06: 16:00:00 via INTRAVENOUS

## 2016-11-06 MED ORDER — SUCCINYLCHOLINE CHLORIDE 20 MG/ML IJ SOLN
INTRAMUSCULAR | Status: DC | PRN
  Administered 2016-11-06: 100 mg via INTRAVENOUS

## 2016-11-06 MED ORDER — VANCOMYCIN HCL IN DEXTROSE 1-5 GM/200ML-% IV SOLN: 1000.0000 mg | INTRAVENOUS | Status: DC

## 2016-11-06 MED ORDER — ONDANSETRON HCL 4 MG/2ML IJ SOLN: 4.0000 mg | INTRAMUSCULAR | Status: DC | PRN

## 2016-11-06 MED ORDER — BACITRACIN ZINC 500 UNIT/GM EX OINT
TOPICAL_OINTMENT | CUTANEOUS | Status: DC | PRN
  Administered 2016-11-06: 1 via TOPICAL

## 2016-11-06 MED ORDER — ROCURONIUM BROMIDE 50 MG/5ML IV SOSY
PREFILLED_SYRINGE | INTRAVENOUS | Status: AC
  Filled 2016-11-06: qty 5

## 2016-11-06 MED ORDER — DEXAMETHASONE 4 MG PO TABS
4.0000 mg | ORAL_TABLET | Freq: Four times a day (QID) | ORAL | Status: DC
  Administered 2016-11-07 – 2016-11-08 (×3): 4 mg via ORAL
  Filled 2016-11-06 (×5): qty 1

## 2016-11-06 MED ORDER — THROMBIN 5000 UNITS EX SOLR
OROMUCOSAL | Status: DC | PRN
  Administered 2016-11-06: 5 mL via TOPICAL
  Administered 2016-11-06: 18:00:00 via TOPICAL

## 2016-11-06 MED ORDER — DEXAMETHASONE SODIUM PHOSPHATE 10 MG/ML IJ SOLN
INTRAMUSCULAR | Status: DC | PRN
Start: 2016-11-06 — End: 2016-11-06
  Administered 2016-11-06 (×3): 10 mg via INTRAVENOUS

## 2016-11-06 MED ORDER — THROMBIN 20000 UNITS EX SOLR
CUTANEOUS | Status: AC
  Filled 2016-11-06: qty 20000

## 2016-11-06 MED ORDER — ROCURONIUM BROMIDE 100 MG/10ML IV SOLN
INTRAVENOUS | Status: DC | PRN
  Administered 2016-11-06: 50 mg via INTRAVENOUS
  Administered 2016-11-06 (×2): 20 mg via INTRAVENOUS

## 2016-11-06 MED ORDER — THROMBIN 5000 UNITS EX SOLR
CUTANEOUS | Status: AC
  Filled 2016-11-06: qty 5000

## 2016-11-06 MED ORDER — SODIUM CHLORIDE 0.9 % IV SOLN
1000.0000 mg | INTRAVENOUS | Status: AC
  Administered 2016-11-06: 1 g via INTRAVENOUS
  Filled 2016-11-06: qty 20

## 2016-11-06 MED ORDER — PROPOFOL 10 MG/ML IV BOLUS
INTRAVENOUS | Status: DC | PRN
  Administered 2016-11-06: 120 mg via INTRAVENOUS

## 2016-11-06 MED ORDER — HYDROCODONE-ACETAMINOPHEN 5-325 MG PO TABS
1.0000 | ORAL_TABLET | ORAL | Status: DC | PRN
  Administered 2016-11-06 – 2016-11-08 (×8): 1 via ORAL
  Filled 2016-11-06 (×9): qty 1

## 2016-11-06 MED ORDER — LIDOCAINE 2% (20 MG/ML) 5 ML SYRINGE
INTRAMUSCULAR | Status: AC
  Filled 2016-11-06: qty 5

## 2016-11-06 MED ORDER — FOLIC ACID 1 MG PO TABS
1.0000 mg | ORAL_TABLET | Freq: Every day | ORAL | Status: DC
  Administered 2016-11-07 – 2016-11-08 (×2): 1 mg via ORAL
  Filled 2016-11-06 (×2): qty 1

## 2016-11-06 MED ORDER — LABETALOL HCL 5 MG/ML IV SOLN
INTRAVENOUS | Status: AC
  Filled 2016-11-06: qty 8

## 2016-11-06 MED ORDER — PHENYLEPHRINE HCL 10 MG/ML IJ SOLN
INTRAMUSCULAR | Status: DC | PRN
  Administered 2016-11-06: 50 ug/min via INTRAVENOUS

## 2016-11-06 MED ORDER — DEXAMETHASONE 4 MG PO TABS: 4.0000 mg | ORAL_TABLET | Freq: Three times a day (TID) | ORAL | Status: DC

## 2016-11-06 MED ORDER — CHLORHEXIDINE GLUCONATE CLOTH 2 % EX PADS: 6.0000 | MEDICATED_PAD | Freq: Once | CUTANEOUS | Status: DC

## 2016-11-06 MED ORDER — PROMETHAZINE HCL 12.5 MG PO TABS
12.5000 mg | ORAL_TABLET | ORAL | Status: DC | PRN
  Filled 2016-11-06: qty 2

## 2016-11-06 MED ORDER — SODIUM CHLORIDE 0.9 % IR SOLN
Status: DC | PRN
  Administered 2016-11-06 (×2): 1000 mL

## 2016-11-06 MED ORDER — BACITRACIN ZINC 500 UNIT/GM EX OINT
TOPICAL_OINTMENT | CUTANEOUS | Status: AC
  Filled 2016-11-06: qty 28.35

## 2016-11-06 MED ORDER — LIDOCAINE HCL (CARDIAC) 20 MG/ML IV SOLN
INTRAVENOUS | Status: DC | PRN
  Administered 2016-11-06: 60 mg via INTRAVENOUS

## 2016-11-06 MED ORDER — METOPROLOL TARTRATE 50 MG PO TABS
50.0000 mg | ORAL_TABLET | Freq: Two times a day (BID) | ORAL | Status: DC
  Administered 2016-11-06 – 2016-11-08 (×4): 50 mg via ORAL
  Filled 2016-11-06 (×4): qty 1

## 2016-11-06 MED ORDER — FINASTERIDE 5 MG PO TABS
5.0000 mg | ORAL_TABLET | Freq: Every day | ORAL | Status: DC
  Administered 2016-11-07 – 2016-11-08 (×2): 5 mg via ORAL
  Filled 2016-11-06 (×2): qty 1

## 2016-11-06 MED ORDER — MORPHINE SULFATE (PF) 2 MG/ML IV SOLN
1.0000 mg | INTRAVENOUS | Status: DC | PRN
  Administered 2016-11-07 (×2): 2 mg via INTRAVENOUS
  Administered 2016-11-07: 1 mg via INTRAVENOUS
  Filled 2016-11-06 (×3): qty 1

## 2016-11-06 MED ORDER — PHENYTOIN SODIUM EXTENDED 100 MG PO CAPS
300.0000 mg | ORAL_CAPSULE | Freq: Every day | ORAL | Status: DC
  Administered 2016-11-06 – 2016-11-07 (×2): 300 mg via ORAL
  Filled 2016-11-06 (×3): qty 3

## 2016-11-06 MED ORDER — DOCUSATE SODIUM 100 MG PO CAPS
100.0000 mg | ORAL_CAPSULE | Freq: Two times a day (BID) | ORAL | Status: DC
  Administered 2016-11-06 – 2016-11-08 (×4): 100 mg via ORAL
  Filled 2016-11-06 (×4): qty 1

## 2016-11-06 MED ORDER — THROMBIN 20000 UNITS EX SOLR
CUTANEOUS | Status: DC | PRN
  Administered 2016-11-06: 18:00:00 via TOPICAL

## 2016-11-06 MED ORDER — MIDAZOLAM HCL 2 MG/2ML IJ SOLN
INTRAMUSCULAR | Status: DC | PRN
  Administered 2016-11-06: 2 mg via INTRAVENOUS

## 2016-11-06 MED ORDER — LISINOPRIL 20 MG PO TABS
20.0000 mg | ORAL_TABLET | Freq: Every day | ORAL | Status: DC
  Administered 2016-11-07 – 2016-11-08 (×2): 20 mg via ORAL
  Filled 2016-11-06 (×2): qty 1

## 2016-11-06 MED ORDER — SODIUM CHLORIDE 0.9 % IV SOLN
INTRAVENOUS | Status: DC | PRN
  Administered 2016-11-06 (×2): via INTRAVENOUS

## 2016-11-06 MED ORDER — SODIUM CHLORIDE 0.9 % IV SOLN: 10.0000 mL/h | Freq: Once | INTRAVENOUS | Status: DC

## 2016-11-06 MED ORDER — VITAMIN B-1 100 MG PO TABS
100.0000 mg | ORAL_TABLET | Freq: Every day | ORAL | Status: DC
  Administered 2016-11-07 – 2016-11-08 (×2): 100 mg via ORAL
  Filled 2016-11-06 (×2): qty 1

## 2016-11-06 MED ORDER — FENTANYL CITRATE (PF) 100 MCG/2ML IJ SOLN
INTRAMUSCULAR | Status: AC
  Filled 2016-11-06: qty 6

## 2016-11-06 MED ORDER — EPHEDRINE SULFATE 50 MG/ML IJ SOLN
INTRAMUSCULAR | Status: DC | PRN
  Administered 2016-11-06: 10 mg via INTRAVENOUS

## 2016-11-06 MED ORDER — POTASSIUM CHLORIDE IN NACL 20-0.9 MEQ/L-% IV SOLN
INTRAVENOUS | Status: DC
  Administered 2016-11-06 – 2016-11-07 (×2): via INTRAVENOUS
  Filled 2016-11-06 (×2): qty 1000

## 2016-11-06 MED ORDER — LIDOCAINE-EPINEPHRINE (PF) 2 %-1:200000 IJ SOLN
INTRAMUSCULAR | Status: AC
  Filled 2016-11-06: qty 20

## 2016-11-06 MED ORDER — CEFAZOLIN SODIUM-DEXTROSE 2-4 GM/100ML-% IV SOLN
INTRAVENOUS | Status: AC
  Filled 2016-11-06: qty 100

## 2016-11-06 MED ORDER — SODIUM CHLORIDE 0.9 % IV SOLN
INTRAVENOUS | Status: DC
  Administered 2016-11-06: 23:00:00 via INTRAVENOUS

## 2016-11-06 MED ORDER — BISACODYL 5 MG PO TBEC: 5.0000 mg | DELAYED_RELEASE_TABLET | Freq: Every day | ORAL | Status: DC | PRN

## 2016-11-06 MED ORDER — ONDANSETRON HCL 4 MG PO TABS: 4.0000 mg | ORAL_TABLET | ORAL | Status: DC | PRN

## 2016-11-06 MED ORDER — THIAMINE HCL 100 MG PO TABS: 100.0000 mg | ORAL_TABLET | Freq: Every day | ORAL | Status: DC

## 2016-11-06 MED ORDER — HEMOSTATIC AGENTS (NO CHARGE) OPTIME
TOPICAL | Status: DC | PRN
  Administered 2016-11-06: 1 via TOPICAL

## 2016-11-06 MED ORDER — FENTANYL CITRATE (PF) 100 MCG/2ML IJ SOLN
INTRAMUSCULAR | Status: AC
  Filled 2016-11-06: qty 2

## 2016-11-06 MED ORDER — MICROFIBRILLAR COLL HEMOSTAT EX POWD
CUTANEOUS | Status: AC
  Filled 2016-11-06: qty 5

## 2016-11-06 MED ORDER — SENNOSIDES-DOCUSATE SODIUM 8.6-50 MG PO TABS: 1.0000 | ORAL_TABLET | Freq: Every evening | ORAL | Status: DC | PRN

## 2016-11-06 MED ORDER — ONDANSETRON HCL 4 MG/2ML IJ SOLN
INTRAMUSCULAR | Status: AC
  Filled 2016-11-06: qty 2

## 2016-11-06 MED ORDER — PHENYTOIN SODIUM EXTENDED 100 MG PO CAPS: 300.0000 mg | ORAL_CAPSULE | Freq: Every day | ORAL | Status: DC

## 2016-11-06 MED ORDER — PANTOPRAZOLE SODIUM 40 MG IV SOLR
40.0000 mg | Freq: Every day | INTRAVENOUS | Status: DC
  Administered 2016-11-06: 40 mg via INTRAVENOUS
  Filled 2016-11-06: qty 40

## 2016-11-06 MED ORDER — DEXAMETHASONE 6 MG PO TABS
6.0000 mg | ORAL_TABLET | Freq: Four times a day (QID) | ORAL | Status: AC
  Administered 2016-11-07 (×4): 6 mg via ORAL
  Filled 2016-11-06 (×4): qty 1

## 2016-11-06 MED ORDER — THROMBIN 5000 UNITS EX SOLR
CUTANEOUS | Status: AC
  Filled 2016-11-06: qty 10000

## 2016-11-06 MED ORDER — PROPOFOL 10 MG/ML IV BOLUS
INTRAVENOUS | Status: AC
  Filled 2016-11-06: qty 20

## 2016-11-06 MED ORDER — ACETAMINOPHEN 325 MG PO TABS: 650.0000 mg | ORAL_TABLET | ORAL | Status: DC | PRN

## 2016-11-06 MED ORDER — ACETAMINOPHEN 650 MG RE SUPP: 650.0000 mg | RECTAL | Status: DC | PRN

## 2016-11-06 MED ORDER — TAMSULOSIN HCL 0.4 MG PO CAPS
0.4000 mg | ORAL_CAPSULE | Freq: Every day | ORAL | Status: DC
  Administered 2016-11-07: 0.4 mg via ORAL
  Filled 2016-11-06: qty 1

## 2016-11-06 MED ORDER — SODIUM CHLORIDE 0.9 % IV SOLN
500.0000 mg | Freq: Two times a day (BID) | INTRAVENOUS | Status: DC
  Administered 2016-11-06 – 2016-11-07 (×2): 500 mg via INTRAVENOUS
  Filled 2016-11-06 (×3): qty 5

## 2016-11-06 MED ORDER — MIDAZOLAM HCL 2 MG/2ML IJ SOLN
INTRAMUSCULAR | Status: AC
  Filled 2016-11-06: qty 2

## 2016-11-06 MED ORDER — CEFAZOLIN SODIUM-DEXTROSE 2-3 GM-% IV SOLR
INTRAVENOUS | Status: DC | PRN
  Administered 2016-11-06: 2 g via INTRAVENOUS

## 2016-11-06 MED ORDER — NALOXONE HCL 0.4 MG/ML IJ SOLN: 0.0800 mg | INTRAMUSCULAR | Status: DC | PRN

## 2016-11-06 MED ORDER — ROCURONIUM BROMIDE 50 MG/5ML IV SOSY
PREFILLED_SYRINGE | INTRAVENOUS | Status: AC
  Filled 2016-11-06: qty 10

## 2016-11-06 MED ORDER — LABETALOL HCL 5 MG/ML IV SOLN: 10.0000 mg | INTRAVENOUS | Status: DC | PRN

## 2016-11-06 MED ORDER — HYDROMORPHONE HCL 1 MG/ML IJ SOLN: 0.2500 mg | INTRAMUSCULAR | Status: DC | PRN

## 2016-11-06 MED ORDER — MAGNESIUM CITRATE PO SOLN: 1.0000 | Freq: Once | ORAL | Status: DC | PRN

## 2016-11-06 MED ORDER — FENTANYL CITRATE (PF) 100 MCG/2ML IJ SOLN
INTRAMUSCULAR | Status: DC | PRN
  Administered 2016-11-06 (×2): 100 ug via INTRAVENOUS
  Administered 2016-11-06 (×2): 50 ug via INTRAVENOUS
  Administered 2016-11-06 (×2): 100 ug via INTRAVENOUS

## 2016-11-06 MED ORDER — GABAPENTIN 300 MG PO CAPS
300.0000 mg | ORAL_CAPSULE | Freq: Three times a day (TID) | ORAL | Status: DC
  Administered 2016-11-06 – 2016-11-08 (×5): 300 mg via ORAL
  Filled 2016-11-06 (×5): qty 1

## 2016-11-06 MED ORDER — ALBUMIN HUMAN 5 % IV SOLN
INTRAVENOUS | Status: DC | PRN
  Administered 2016-11-06 (×2): via INTRAVENOUS

## 2016-11-06 MED ORDER — LIDOCAINE-EPINEPHRINE (PF) 2 %-1:200000 IJ SOLN
INTRAMUSCULAR | Status: DC | PRN
  Administered 2016-11-06: 14 mL

## 2016-11-06 SURGICAL SUPPLY — 88 items
APPLICATOR COTTON TIP 6IN STRL (MISCELLANEOUS) ×2 IMPLANT
BENZOIN TINCTURE PRP APPL 2/3 (GAUZE/BANDAGES/DRESSINGS) IMPLANT
BLADE CLIPPER SURG (BLADE) ×2 IMPLANT
BLADE SAW GIGLI 16 STRL (MISCELLANEOUS) IMPLANT
BLADE SURG 15 STRL LF DISP TIS (BLADE) IMPLANT
BLADE SURG 15 STRL SS (BLADE)
BLADE ULTRA TIP 2M (BLADE) ×2 IMPLANT
BNDG GAUZE ELAST 4 BULKY (GAUZE/BANDAGES/DRESSINGS) ×2 IMPLANT
BUR ACORN 6.0 PRECISION (BURR) ×2 IMPLANT
BUR MATCHSTICK NEURO 3.0 LAGG (BURR) IMPLANT
BUR SPIRAL ROUTER 2.3 (BUR) ×2 IMPLANT
CANISTER SUCT 3000ML PPV (MISCELLANEOUS) ×4 IMPLANT
CARTRIDGE OIL MAESTRO DRILL (MISCELLANEOUS) ×1 IMPLANT
CATH COUDE FOLEY 2W 5CC 18FR (CATHETERS) ×2 IMPLANT
CATH VENTRIC 35X38 W/TROCAR LG (CATHETERS) IMPLANT
CLIP TI MEDIUM 6 (CLIP) IMPLANT
CONT SPEC 4OZ CLIKSEAL STRL BL (MISCELLANEOUS) ×2 IMPLANT
COVER MAYO STAND STRL (DRAPES) IMPLANT
DECANTER SPIKE VIAL GLASS SM (MISCELLANEOUS) ×2 IMPLANT
DIFFUSER DRILL AIR PNEUMATIC (MISCELLANEOUS) ×2 IMPLANT
DRAIN SUBARACHNOID (WOUND CARE) IMPLANT
DRAPE CAMERA VIDEO/LASER (DRAPES) IMPLANT
DRAPE MICROSCOPE LEICA (MISCELLANEOUS) ×2 IMPLANT
DRAPE NEUROLOGICAL W/INCISE (DRAPES) ×2 IMPLANT
DRAPE ORTHO SPLIT 77X108 STRL (DRAPES)
DRAPE STERI IOBAN 125X83 (DRAPES) IMPLANT
DRAPE SURG 17X23 STRL (DRAPES) IMPLANT
DRAPE SURG ORHT 6 SPLT 77X108 (DRAPES) IMPLANT
DRAPE WARM FLUID 44X44 (DRAPE) ×2 IMPLANT
DURAMATRIX ONLAY 3X3 (Plate) ×2 IMPLANT
DURAPREP 6ML APPLICATOR 50/CS (WOUND CARE) ×2 IMPLANT
ELECT CAUTERY BLADE 6.4 (BLADE) IMPLANT
ELECT REM PT RETURN 9FT ADLT (ELECTROSURGICAL) ×2
ELECTRODE REM PT RTRN 9FT ADLT (ELECTROSURGICAL) ×1 IMPLANT
EVACUATOR 1/8 PVC DRAIN (DRAIN) IMPLANT
EVACUATOR SILICONE 100CC (DRAIN) IMPLANT
FORCEPS BIPOLAR SPETZLER 8 1.0 (NEUROSURGERY SUPPLIES) ×2 IMPLANT
GAUZE SPONGE 4X4 12PLY STRL (GAUZE/BANDAGES/DRESSINGS) ×2 IMPLANT
GAUZE SPONGE 4X4 16PLY XRAY LF (GAUZE/BANDAGES/DRESSINGS) IMPLANT
GLOVE BIO SURGEON STRL SZ 6.5 (GLOVE) ×2 IMPLANT
GLOVE BIO SURGEON STRL SZ7 (GLOVE) ×2 IMPLANT
GLOVE ECLIPSE 6.5 STRL STRAW (GLOVE) ×2 IMPLANT
GLOVE EXAM NITRILE LRG STRL (GLOVE) IMPLANT
GLOVE EXAM NITRILE XL STR (GLOVE) IMPLANT
GLOVE EXAM NITRILE XS STR PU (GLOVE) IMPLANT
GOWN STRL REUS W/ TWL LRG LVL3 (GOWN DISPOSABLE) ×3 IMPLANT
GOWN STRL REUS W/ TWL XL LVL3 (GOWN DISPOSABLE) ×1 IMPLANT
GOWN STRL REUS W/TWL 2XL LVL3 (GOWN DISPOSABLE) IMPLANT
GOWN STRL REUS W/TWL LRG LVL3 (GOWN DISPOSABLE) ×3
GOWN STRL REUS W/TWL XL LVL3 (GOWN DISPOSABLE) ×1
HEMOSTAT POWDER KIT SURGIFOAM (HEMOSTASIS) ×2 IMPLANT
HEMOSTAT SURGICEL 2X14 (HEMOSTASIS) ×2 IMPLANT
KIT BASIN OR (CUSTOM PROCEDURE TRAY) ×2 IMPLANT
KIT DRAIN CSF ACCUDRAIN (MISCELLANEOUS) IMPLANT
KIT ROOM TURNOVER OR (KITS) ×2 IMPLANT
NEEDLE HYPO 25X1 1.5 SAFETY (NEEDLE) ×2 IMPLANT
NEEDLE SPNL 18GX3.5 QUINCKE PK (NEEDLE) IMPLANT
NS IRRIG 1000ML POUR BTL (IV SOLUTION) ×4 IMPLANT
OIL CARTRIDGE MAESTRO DRILL (MISCELLANEOUS) ×2
PACK CRANIOTOMY (CUSTOM PROCEDURE TRAY) ×2 IMPLANT
PATTIES SURGICAL .25X.25 (GAUZE/BANDAGES/DRESSINGS) IMPLANT
PATTIES SURGICAL .5 X.5 (GAUZE/BANDAGES/DRESSINGS) IMPLANT
PATTIES SURGICAL .5 X3 (DISPOSABLE) IMPLANT
PATTIES SURGICAL 1/4 X 3 (GAUZE/BANDAGES/DRESSINGS) IMPLANT
PATTIES SURGICAL 1X1 (DISPOSABLE) IMPLANT
PLATE 1.5 2H 17 DOU T (Plate) ×4 IMPLANT
PLATE 1.5/0.5 18.5MM BURR HOLE (Plate) ×4 IMPLANT
RUBBERBAND STERILE (MISCELLANEOUS) IMPLANT
SCREW SELF DRILL HT 1.5/4MM (Screw) ×30 IMPLANT
SPECIMEN JAR SMALL (MISCELLANEOUS) IMPLANT
SPONGE NEURO XRAY DETECT 1X3 (DISPOSABLE) ×2 IMPLANT
SPONGE SURGIFOAM ABS GEL 100 (HEMOSTASIS) ×2 IMPLANT
STAPLER VISISTAT 35W (STAPLE) ×2 IMPLANT
SUT ETHILON 3 0 FSL (SUTURE) IMPLANT
SUT ETHILON 3 0 PS 1 (SUTURE) IMPLANT
SUT NURALON 4 0 TR CR/8 (SUTURE) ×4 IMPLANT
SUT SILK 0 TIES 10X30 (SUTURE) IMPLANT
SUT STEEL 0 (SUTURE)
SUT STEEL 0 18XMFL TIE 17 (SUTURE) IMPLANT
SUT VIC AB 2-0 CT2 18 VCP726D (SUTURE) ×6 IMPLANT
SYR CONTROL 10ML LL (SYRINGE) IMPLANT
TAPE CLOTH 1X10 TAN NS (GAUZE/BANDAGES/DRESSINGS) ×2 IMPLANT
TOWEL GREEN STERILE (TOWEL DISPOSABLE) ×2 IMPLANT
TOWEL GREEN STERILE FF (TOWEL DISPOSABLE) ×2 IMPLANT
TRAY FOLEY W/METER SILVER 16FR (SET/KITS/TRAYS/PACK) ×2 IMPLANT
TUBE CONNECTING 12X1/4 (SUCTIONS) IMPLANT
UNDERPAD 30X30 (UNDERPADS AND DIAPERS) IMPLANT
WATER STERILE IRR 1000ML POUR (IV SOLUTION) ×2 IMPLANT

## 2016-11-06 NOTE — Progress Notes (Signed)
[facility] Progress Note Patient Name: Frederick Drake DOB: Mar 08, 1961 MRN: [id number]   Date of Service  11/06/2016  HPI/Events of Note  S/P l Craniotomy for meningioma/ sats and hemodynamics ok  eICU Interventions  Will monitor      Intervention Category Major Interventions: Other:  Christinia Gully 11/06/2016, 10:20 PM

## 2016-11-06 NOTE — Op Note (Signed)
11/06/2016  9:17 PM  PATIENT:  Frederick Drake  56 y.o. male  PRE-OPERATIVE DIAGNOSIS:  MENINGIOMA  POST-OPERATIVE DIAGNOSIS:  MENINGIOMA  PROCEDURE:  Procedure(s): LEFT Temporal CRANIOTOMY FOR TUMOR  SURGEON: Surgeon(s): Ashok Pall, MD Erline Levine, MD  ASSISTANTS:Stern, Broadus John  ANESTHESIA:   general  EBL:  Total I/O In: 8295 [I.V.:1200] Out: 6213 [Urine:1025]  BLOOD ADMINISTERED:350 CC PRBC  COUNT:per nursing  DRAINS: none   SPECIMEN:  Source of Specimen:  left temporal lobe  DICTATION: [name] was taken to the operating room, intubated, and placed under a general anesthetic without difficulty. He was positioned on his side, with his left side up. All pressure points were properly padded. Once adequate anesthesia was obtained I placed a three pin Mayfield head holder exposing the left temporal region. His head was shaved,  was prepped and was draped in a sterile manner. I infiltrated lidocaine into the scalp along the planned incision. I opened the skin and placed raney clips along the edges. I made a horseshoe incision starting anterior to the tragus, headed towards the parietal boss, and came around posterior to the ear. I exposed the temporal bone and retracted the flap laterally. I created 4 burr holes in the corners of the exposure with Dr. Melven Sartorius assistance. We noted early that the bone was bleeding more than usual. We turned the flap, and there was a great deal of bleeding from the middle meningeal artery and the dura. We controlled the bleeding with bone wax, and with cautery of the dural edges. I opened the lateral margin of dura and immediately encountered tumor. Using a combination of suction, bipolar cautery, and the [place] we removed tumor. We sent specimens for permanent sectioning. Over time and considerable blood loss we removed the tumor, had clean margins with the brain, and I cauterized quite heavily the tentorium which was oozing once tumor was removed.  The bleeding was controlled with various hemostatic agents and irrigation. I then placed a dural onlay graft over the resection site. I also placed tack up sutures around the edges. I approximated the bone flap with plates and screws. I believe a very good supratentorial tumor resection was performed, but the tumor also invaded the tentorium and transverse sinus on the left. This was taken into consideration before the procedure I approximated the scalp with galeal sutures and staples on the skin edges.  I applied a sterile dressing. I removed the Mayfield head holder and we moved him to the ICU bed. He was extubated following commands in the OR room.   PLAN OF CARE: Admit to inpatient   PATIENT DISPOSITION:  PACU - hemodynamically stable.   Delay start of Pharmacological VTE agent (>24hrs) due to surgical blood loss or risk of bleeding:  yes

## 2016-11-06 NOTE — Anesthesia Procedure Notes (Signed)
Procedure Name: Intubation Date/Time: 11/06/2016 4:49 PM Performed by: Rebekah Chesterfield L Pre-anesthesia Checklist: Patient identified, Emergency Drugs available, Suction available and Patient being monitored Patient Re-evaluated:Patient Re-evaluated prior to inductionOxygen Delivery Method: Circle System Utilized Preoxygenation: Pre-oxygenation with 100% oxygen Intubation Type: IV induction Ventilation: Mask ventilation without difficulty Laryngoscope Size: Mac and 4 Grade View: Grade I Tube type: Oral Tube size: 8.0 mm Number of attempts: 1 Airway Equipment and Method: Stylet Placement Confirmation: ETT inserted through vocal cords under direct vision,  positive ETCO2 and breath sounds checked- equal and bilateral Secured at: 22 cm Tube secured with: Tape Dental Injury: Teeth and Oropharynx as per pre-operative assessment

## 2016-11-06 NOTE — H&P (View-Only) (Signed)
Reason for Consult:intracranial masses Referring Physician: Basir, Frederick Drake is an 56 y.o. male.  HPI: whom presented to [facility] with status epilepticus. Intracranial imaging revealed two masses. Right anterior skull base, and posterior temporal parietal mass. EEG is suggesting that the left temporal lesion may be triggering the seizures.  Past Medical History:  Diagnosis Date  . Allergy    Seasonal  . Carpal tunnel syndrome   . Hypertension   . Neuropathy (HCC)    Both legs  . Seizures (Frederick Drake)     Past Surgical History:  Procedure Laterality Date  . HERNIA REPAIR    . VASECTOMY      Family History  Problem Relation Age of Onset  . Cancer Sister     Breast    Social History:  reports that he has been smoking Cigarettes.  He has a 80.00 pack-year smoking history. He has never used smokeless tobacco. He reports that he drinks alcohol. He reports that he does not use drugs.  Allergies:  Allergies  Allergen Reactions  . Penicillins Rash    Medications: I have reviewed the patient's current medications.  Results for orders placed or performed during the hospital encounter of 10/16/16 (from the past 48 hour(s))  Lactic acid, plasma     Status: None   Collection Time: 10/17/16  3:42 PM  Result Value Ref Range   Lactic Acid, Venous 1.3 0.5 - 1.9 mmol/L  Magnesium     Status: None   Collection Time: 10/17/16  3:42 PM  Result Value Ref Range   Magnesium 1.7 1.7 - 2.4 mg/dL  Glucose, capillary     Status: Abnormal   Collection Time: 10/17/16  4:13 PM  Result Value Ref Range   Glucose-Capillary 138 (H) 65 - 99 mg/dL   Comment 1 Notify RN   Glucose, capillary     Status: Abnormal   Collection Time: 10/17/16  8:53 PM  Result Value Ref Range   Glucose-Capillary 208 (H) 65 - 99 mg/dL   Comment 1 Capillary Specimen    Comment 2 Notify RN   Lactic acid, plasma     Status: None   Collection Time: 10/17/16 10:51 PM  Result Value Ref Range   Lactic Acid, Venous 1.5 0.5 -  1.9 mmol/L  Glucose, capillary     Status: Abnormal   Collection Time: 10/17/16 11:54 PM  Result Value Ref Range   Glucose-Capillary 208 (H) 65 - 99 mg/dL   Comment 1 Capillary Specimen    Comment 2 Notify RN   Comprehensive metabolic panel     Status: Abnormal   Collection Time: 10/18/16  2:35 AM  Result Value Ref Range   Sodium 136 135 - 145 mmol/L   Potassium 3.7 3.5 - 5.1 mmol/L   Chloride 95 (L) 101 - 111 mmol/L   CO2 29 22 - 32 mmol/L   Glucose, Bld 168 (H) 65 - 99 mg/dL   BUN 12 6 - 20 mg/dL   Creatinine, Ser 0.76 0.61 - 1.24 mg/dL   Calcium 9.2 8.9 - 10.3 mg/dL   Total Protein 7.2 6.5 - 8.1 g/dL   Albumin 3.3 (L) 3.5 - 5.0 g/dL   AST 57 (H) 15 - 41 U/L   ALT 28 17 - 63 U/L   Alkaline Phosphatase 54 38 - 126 U/L   Total Bilirubin 0.8 0.3 - 1.2 mg/dL   GFR calc non Af Amer >60 >60 mL/min   GFR calc Af Amer >60 >60 mL/min  Comment: (NOTE) The eGFR has been calculated using the CKD EPI equation. This calculation has not been validated in all clinical situations. eGFR's persistently <60 mL/min signify possible Chronic Kidney Disease.    Anion gap 12 5 - 15  Magnesium     Status: None   Collection Time: 10/18/16  2:35 AM  Result Value Ref Range   Magnesium 2.0 1.7 - 2.4 mg/dL  Phosphorus     Status: None   Collection Time: 10/18/16  2:35 AM  Result Value Ref Range   Phosphorus 2.7 2.5 - 4.6 mg/dL  CBC with Differential/Platelet     Status: Abnormal   Collection Time: 10/18/16  2:35 AM  Result Value Ref Range   WBC 8.7 4.0 - 10.5 K/uL   RBC 4.56 4.22 - 5.81 MIL/uL   Hemoglobin 14.9 13.0 - 17.0 g/dL   HCT 44.1 39.0 - 52.0 %   MCV 96.7 78.0 - 100.0 fL   MCH 32.7 26.0 - 34.0 pg   MCHC 33.8 30.0 - 36.0 g/dL   RDW 12.5 11.5 - 15.5 %   Platelets 104 (L) 150 - 400 K/uL    Comment: CONSISTENT WITH PREVIOUS RESULT   Neutrophils Relative % 87 %   Neutro Abs 7.6 1.7 - 7.7 K/uL   Lymphocytes Relative 7 %   Lymphs Abs 0.6 (L) 0.7 - 4.0 K/uL   Monocytes Relative 6 %    Monocytes Absolute 0.5 0.1 - 1.0 K/uL   Eosinophils Relative 0 %   Eosinophils Absolute 0.0 0.0 - 0.7 K/uL   Basophils Relative 0 %   Basophils Absolute 0.0 0.0 - 0.1 K/uL  Glucose, capillary     Status: Abnormal   Collection Time: 10/18/16  3:29 AM  Result Value Ref Range   Glucose-Capillary 175 (H) 65 - 99 mg/dL   Comment 1 Capillary Specimen    Comment 2 Notify RN   Glucose, capillary     Status: Abnormal   Collection Time: 10/18/16  8:53 AM  Result Value Ref Range   Glucose-Capillary 191 (H) 65 - 99 mg/dL   Comment 1 Capillary Specimen    Comment 2 Notify RN   Glucose, capillary     Status: Abnormal   Collection Time: 10/18/16 12:22 PM  Result Value Ref Range   Glucose-Capillary 164 (H) 65 - 99 mg/dL   Comment 1 Capillary Specimen    Comment 2 Notify RN   Glucose, capillary     Status: Abnormal   Collection Time: 10/18/16  4:02 PM  Result Value Ref Range   Glucose-Capillary 155 (H) 65 - 99 mg/dL   Comment 1 Capillary Specimen    Comment 2 Notify RN   Glucose, capillary     Status: Abnormal   Collection Time: 10/18/16  8:12 PM  Result Value Ref Range   Glucose-Capillary 156 (H) 65 - 99 mg/dL   Comment 1 Capillary Specimen    Comment 2 Notify RN   Glucose, capillary     Status: Abnormal   Collection Time: 10/19/16 12:46 AM  Result Value Ref Range   Glucose-Capillary 168 (H) 65 - 99 mg/dL   Comment 1 Capillary Specimen    Comment 2 Notify RN   CBC     Status: Abnormal   Collection Time: 10/19/16  3:08 AM  Result Value Ref Range   WBC 14.0 (H) 4.0 - 10.5 K/uL   RBC 4.38 4.22 - 5.81 MIL/uL   Hemoglobin 14.3 13.0 - 17.0 g/dL   HCT 43.3 39.0 -  52.0 %   MCV 98.9 78.0 - 100.0 fL   MCH 32.6 26.0 - 34.0 pg   MCHC 33.0 30.0 - 36.0 g/dL   RDW 12.5 11.5 - 15.5 %   Platelets 91 (L) 150 - 400 K/uL    Comment: CONSISTENT WITH PREVIOUS RESULT  Basic metabolic panel     Status: Abnormal   Collection Time: 10/19/16  3:08 AM  Result Value Ref Range   Sodium 138 135 - 145  mmol/L   Potassium 3.6 3.5 - 5.1 mmol/L   Chloride 102 101 - 111 mmol/L   CO2 28 22 - 32 mmol/L   Glucose, Bld 134 (H) 65 - 99 mg/dL   BUN 16 6 - 20 mg/dL   Creatinine, Ser 0.76 0.61 - 1.24 mg/dL   Calcium 8.6 (L) 8.9 - 10.3 mg/dL   GFR calc non Af Amer >60 >60 mL/min   GFR calc Af Amer >60 >60 mL/min    Comment: (NOTE) The eGFR has been calculated using the CKD EPI equation. This calculation has not been validated in all clinical situations. eGFR's persistently <60 mL/min signify possible Chronic Kidney Disease.    Anion gap 8 5 - 15  Magnesium     Status: None   Collection Time: 10/19/16  3:08 AM  Result Value Ref Range   Magnesium 2.2 1.7 - 2.4 mg/dL  Phosphorus     Status: None   Collection Time: 10/19/16  3:08 AM  Result Value Ref Range   Phosphorus 2.9 2.5 - 4.6 mg/dL  Glucose, capillary     Status: Abnormal   Collection Time: 10/19/16  4:46 AM  Result Value Ref Range   Glucose-Capillary 145 (H) 65 - 99 mg/dL   Comment 1 Capillary Specimen    Comment 2 Notify RN   Glucose, capillary     Status: Abnormal   Collection Time: 10/19/16  8:48 AM  Result Value Ref Range   Glucose-Capillary 158 (H) 65 - 99 mg/dL   Comment 1 Capillary Specimen    Comment 2 Notify RN   Glucose, capillary     Status: Abnormal   Collection Time: 10/19/16  1:29 PM  Result Value Ref Range   Glucose-Capillary 168 (H) 65 - 99 mg/dL   Comment 1 Capillary Specimen    Comment 2 Notify RN     Dg Chest Port 1 View  Result Date: 10/19/2016 CLINICAL DATA:  Acute respiratory failure. EXAM: PORTABLE CHEST 1 VIEW COMPARISON:  Radiograph October 17, 2016. FINDINGS: The heart size and mediastinal contours are within normal limits. Both lungs are clear. No pneumothorax or pleural effusion is noted. Endotracheal and nasogastric tubes are again noted and unchanged. The visualized skeletal structures are unremarkable. IMPRESSION: Stable support apparatus. No acute cardiopulmonary abnormality seen. Electronically  Signed   By: Marijo Conception, M.D.   On: 10/19/2016 08:13    Review of Systems  Unable to perform ROS: Medical condition  Neurological: Positive for seizures.   Blood pressure (!) 150/73, pulse (!) 57, temperature 99.7 F (37.6 C), temperature source Oral, resp. rate 14, height 6' 2" (1.88 m), weight 103.4 kg (227 lb 15.3 oz), SpO2 97 %. Physical Exam  Constitutional: He appears well-developed and well-nourished.  HENT:  Head: Normocephalic and atraumatic.  Eyes: Pupils are equal, round, and reactive to light.  Neurological: He is unresponsive.  Unable to perform any detailed exam given intubation, sedation, and mental status. Has by report followed commands earlier this am.  corneals intact, +cough  Skin:  Skin is warm and dry.    Assessment/Plan: Mr. Sidman presented to the hospital with status epilepticus, and newly diagnosed intracranial masses. The masses most likely are meningiomas. The left posterior temporal mass is not causing surrounding edema unlike the right anterior skull base meningioma. I agree that if the seizures are being triggered in the left temporal lobe that tumor resection is beneficial. However at this time multiple medical issues take great precedence over a craniotomy. Will continue to follow  CABBELL,KYLE L 10/19/2016, 3:21 PM

## 2016-11-06 NOTE — Anesthesia Procedure Notes (Signed)
Central Venous Catheter Insertion Performed by: Roderic Palau, anesthesiologist Start/End3/22/2018 3:53 PM, 11/06/2016 4:03 PM Patient location: Pre-op. Preanesthetic checklist: patient identified, IV checked, site marked, risks and benefits discussed, surgical consent, monitors and equipment checked, pre-op evaluation, timeout performed and anesthesia consent Position: Trendelenburg Lidocaine 1% used for infiltration and patient sedated Hand hygiene performed , maximum sterile barriers used  and Seldinger technique used Catheter size: 8 Fr Total catheter length 16. Central line was placed.Double lumen Procedure performed without using ultrasound guided technique. Attempts: 1 Following insertion, dressing applied, line sutured and Biopatch. Post procedure assessment: blood return through all ports  Patient tolerated the procedure well with no immediate complications.

## 2016-11-06 NOTE — Interval H&P Note (Signed)
History and Physical Interval Note:  11/06/2016 3:35 PM  Frederick Drake  has presented today for surgery, with the diagnosis of MENINGIOMA  The various methods of treatment have been discussed with the patient and family. After consideration of risks, benefits and other options for treatment, the patient has consented to  Procedure(s) with comments: LEFT PARIETAL CRANIOTOMY FOR TUMOR (Left) - LEFT PARIETAL CRANIOTOMY FOR TUMOR  as a surgical intervention .  The patient's history has been reviewed, patient examined, no change in status, stable for surgery.  I have reviewed the patient's chart and labs.  Questions were answered to the patient's satisfaction.   Alert and oriented x 4, speech is clear Moving all extremities perrl, full eom Tongue and uvula midline Normal shoulder shrug   Alaura Schippers L

## 2016-11-06 NOTE — Anesthesia Postprocedure Evaluation (Signed)
Anesthesia Post Note  Patient: Frederick Drake  Procedure(s) Performed: Procedure(s) (LRB): LEFT Temporal CRANIOTOMY FOR TUMOR (Left)  Patient location during evaluation: PACU Anesthesia Type: General Level of consciousness: awake and alert, oriented and patient cooperative Pain management: pain level controlled Vital Signs Assessment: post-procedure vital signs reviewed and stable Respiratory status: spontaneous breathing, nonlabored ventilation, respiratory function stable and patient connected to nasal cannula oxygen Cardiovascular status: blood pressure returned to baseline and stable Postop Assessment: no signs of nausea or vomiting Anesthetic complications: no       Last Vitals:  Vitals:   11/06/16 2050 11/06/16 2100  BP: (!) 121/59 (!) 103/55  Pulse: 90 84  Resp: 15 14  Temp: 36.1 C     Last Pain:  Vitals:   11/06/16 1334  TempSrc: Oral  PainSc:                  Leandro Berkowitz,E. Mareo Portilla

## 2016-11-06 NOTE — Anesthesia Procedure Notes (Signed)
Arterial Line Insertion Start/End3/22/2018 3:30 PM Performed by: Roderic Palau, Gilmar Bua L, CRNA  Patient location: Pre-op. Preanesthetic checklist: patient identified, IV checked, site marked, risks and benefits discussed, surgical consent, monitors and equipment checked, pre-op evaluation and timeout performed Lidocaine 1% used for infiltration and patient sedated Right, radial was placed Catheter size: 20 Fr Hand hygiene performed , maximum sterile barriers used  and Seldinger technique used Allen's test indicative of satisfactory collateral circulation Attempts: 1 Procedure performed without using ultrasound guided technique. Following insertion, Biopatch and dressing applied. Post procedure assessment: normal  Patient tolerated the procedure well with no immediate complications.

## 2016-11-06 NOTE — Anesthesia Preprocedure Evaluation (Addendum)
Anesthesia Evaluation  Patient identified by MRN, date of birth, ID band Patient awake    Reviewed: Allergy & Precautions, H&P , NPO status , Patient's Chart, lab work & pertinent test results, reviewed documented beta blocker date and time   Airway Mallampati: II  TM Distance: >3 FB Neck ROM: Full    Dental no notable dental hx. (+) Edentulous Upper, Edentulous Lower, Dental Advisory Given   Pulmonary Current Smoker,    Pulmonary exam normal breath sounds clear to auscultation       Cardiovascular hypertension, Pt. on medications and Pt. on home beta blockers  Rhythm:Regular Rate:Normal     Neuro/Psych  Headaches, Seizures -, Poorly Controlled,  negative psych ROS   GI/Hepatic negative GI ROS, Neg liver ROS,   Endo/Other  negative endocrine ROS  Renal/GU negative Renal ROS  negative genitourinary   Musculoskeletal   Abdominal   Peds  Hematology negative hematology ROS (+)   Anesthesia Other Findings   Reproductive/Obstetrics negative OB ROS                            Anesthesia Physical Anesthesia Plan  ASA: III  Anesthesia Plan: General   Post-op Pain Management:    Induction: Intravenous  Airway Management Planned: Oral ETT  Additional Equipment: Arterial line and CVP  Intra-op Plan:   Post-operative Plan: Extubation in OR and Possible Post-op intubation/ventilation  Informed Consent: I have reviewed the patients History and Physical, chart, labs and discussed the procedure including the risks, benefits and alternatives for the proposed anesthesia with the patient or authorized representative who has indicated his/her understanding and acceptance.   Dental advisory given  Plan Discussed with: CRNA  Anesthesia Plan Comments:         Anesthesia Quick Evaluation

## 2016-11-06 NOTE — Transfer of Care (Signed)
Immediate Anesthesia Transfer of Care Note  Patient: Frederick Drake  Procedure(s) Performed: Procedure(s) with comments: LEFT Temporal CRANIOTOMY FOR TUMOR (Left) - LEFT Temproal CRANIOTOMY FOR TUMOR   Patient Location: PACU  Anesthesia Type:General  Level of Consciousness: sedated, patient cooperative and responds to stimulation  Airway & Oxygen Therapy: Patient Spontanous Breathing and Patient connected to face mask oxygen  Post-op Assessment: Report given to RN, Post -op Vital signs reviewed and stable and Patient moving all extremities X 4  Post vital signs: Reviewed and stable  Last Vitals:  Vitals:   11/06/16 1319 11/06/16 1334  BP: 101/73   Pulse: 91   Resp: 20   Temp:  36.7 C    Last Pain:  Vitals:   11/06/16 1334  TempSrc: Oral  PainSc:       Patients Stated Pain Goal: 2 (27/25/36 6440)  Complications: No apparent anesthesia complications

## 2016-11-07 ENCOUNTER — Encounter (HOSPITAL_COMMUNITY): Payer: Self-pay | Admitting: Neurosurgery

## 2016-11-07 LAB — POCT I-STAT 7, (LYTES, BLD GAS, ICA,H+H)
Acid-base deficit: 1 mmol/L (ref 0.0–2.0)
Acid-base deficit: 1 mmol/L (ref 0.0–2.0)
BICARBONATE: 24 mmol/L (ref 20.0–28.0)
Bicarbonate: 25 mmol/L (ref 20.0–28.0)
CALCIUM ION: 1.13 mmol/L — AB (ref 1.15–1.40)
Calcium, Ion: 1.17 mmol/L (ref 1.15–1.40)
HCT: 31 % — ABNORMAL LOW (ref 39.0–52.0)
HEMATOCRIT: 33 % — AB (ref 39.0–52.0)
HEMOGLOBIN: 11.2 g/dL — AB (ref 13.0–17.0)
Hemoglobin: 10.5 g/dL — ABNORMAL LOW (ref 13.0–17.0)
O2 SAT: 96 %
O2 Saturation: 98 %
PCO2 ART: 39.1 mmHg (ref 32.0–48.0)
PH ART: 7.391 (ref 7.350–7.450)
PO2 ART: 77 mmHg — AB (ref 83.0–108.0)
PO2 ART: 96 mmHg (ref 83.0–108.0)
POTASSIUM: 3.1 mmol/L — AB (ref 3.5–5.1)
POTASSIUM: 3.9 mmol/L (ref 3.5–5.1)
Sodium: 141 mmol/L (ref 135–145)
Sodium: 140 mmol/L (ref 135–145)
TCO2: 25 mmol/L (ref 0–100)
TCO2: 26 mmol/L (ref 0–100)
pCO2 arterial: 43 mmHg (ref 32.0–48.0)
pH, Arterial: 7.368 (ref 7.350–7.450)

## 2016-11-07 NOTE — Care Management Note (Addendum)
Case Management Note  Patient Details  Name: Frederick Drake MRN: [id number] Date of Birth: 08-Aug-1961  Subjective/Objective:  Pt admitted on 11/06/16 s/p craniotomy with meningioma removal.  PTA, pt independent, lives with spouse.                   Action/Plan: Will follow for discharge planning as pt progresses.  3/24 Addendum D Kirandeep Fariss RN CM. Patient to DC to home today per MD order. New medication on AVS noted to be Norco, assistance not available for narcotics. Follow up with neuro as directed by MD.   Expected Discharge Date:                  Expected Discharge Plan:  Home/Self Care  In-House Referral:     Discharge planning Services  CM Consult  Post Acute Care Choice:    Choice offered to:     DME Arranged:    DME Agency:     HH Arranged:    [place] Agency:     Status of Service:  In process, will continue to follow  If discussed at Long Length of Stay Meetings, dates discussed:    Additional Comments:  Reinaldo Raddle, RN, BSN  Trauma/Neuro ICU Case Manager [phone]

## 2016-11-07 NOTE — Progress Notes (Signed)
Patient ID: Frederick Drake, male   DOB: [birth year], 56 y.o.   MRN: [id number] BP (!) 108/53   Pulse 66   Temp 97.8 F (36.6 C) (Axillary)   Resp 15   Ht 6' (1.829 m)   Wt 99.3 kg (219 lb)   SpO2 97%   BMI 29.70 kg/m  Alert and oriented x 4 speech is clear and fluent Moving all extremities well Wound Is clean dry, without signs of infection Will transfer to floor.

## 2016-11-07 NOTE — Progress Notes (Signed)
Pt arrived to 5C15 via wheelchair.  Pt alert and oriented, complaints of pain 5/10.  In no apparent distress, ambulated from wheelchair to chair.  Will continue to monitor.  Cori Razor, RN

## 2016-11-07 NOTE — Addendum Note (Signed)
Addendum  created 11/07/16 2328 by Claris Che, CRNA   Anesthesia Attestations filed

## 2016-11-08 MED ORDER — SODIUM CHLORIDE 0.9% FLUSH
10.0000 mL | INTRAVENOUS | Status: DC | PRN
  Administered 2016-11-08: 10 mL
  Filled 2016-11-08: qty 40

## 2016-11-08 MED ORDER — HYDROCODONE-ACETAMINOPHEN 5-325 MG PO TABS: 1.0000 | ORAL_TABLET | Freq: Four times a day (QID) | ORAL | 0 refills | Status: DC | PRN

## 2016-11-08 MED ORDER — DEXAMETHASONE 2 MG PO TABS: ORAL_TABLET | ORAL | 0 refills | Status: DC

## 2016-11-08 NOTE — Discharge Summary (Signed)
Physician Discharge Summary  Patient ID: DONTREAL MIERA MRN: [id number] DOB/AGE: 1961-05-15 56 y.o.  Admit date: 11/06/2016 Discharge date: 11/08/2016  Admission Diagnoses:Left temporal brain tumor  Discharge Diagnoses: Left temporal meningioma Active Problems:   Meningioma [facility])   Brain tumor [facility])   Discharged Condition: good  Hospital Course: Patient was omitted to undergo craniotomy for resection of left temporal brain tumor. He tolerated surgery well.  Consults: None  Significant Diagnostic Studies: None  Treatments: surgery: Left temporal craniotomy and resection of tumor  Discharge Exam: Blood pressure (!) 122/58, pulse 82, temperature 98 F (36.7 C), temperature source Oral, resp. rate 20, height 6' (1.829 m), weight 99.3 kg (219 lb), SpO2 100 %. Vital signs are stable area station and gait is stable.  Disposition: 01-Home or Self Care  Discharge Instructions    Call MD for:  redness, tenderness, or signs of infection (pain, swelling, redness, odor or green/yellow discharge around incision site)    Complete by:  As directed    Call MD for:  severe uncontrolled pain    Complete by:  As directed    Call MD for:  temperature >100.4    Complete by:  As directed    Diet - low sodium heart healthy    Complete by:  As directed    Increase activity slowly    Complete by:  As directed      Allergies as of 11/08/2016      Reactions   Penicillins Rash   Has patient had a PCN reaction causing immediate rash, facial/tongue/throat swelling, SOB or lightheadedness with hypotension:YES Has patient had a PCN reaction causing severe rash involving mucus membranes or skin necrosis: NO Has patient had a PCN reaction that required hospitalization NO Has patient had a PCN reaction occurring within the last 10 years: NO If all of the above answers are "NO", then may proceed with Cephalosporin use.      Medication List    TAKE these medications   dexamethasone 2 MG  tablet Commonly known as:  DECADRON Take 2 mg by mouth every 6 (six) hours. Take for 7 days What changed:  Another medication with the same name was added. Make sure you understand how and when to take each.   dexamethasone 2 MG tablet Commonly known as:  DECADRON 2 tablets twice daily for 2 days, one tablet twice daily for 2 days, one tablet daily for 2 days. What changed:  You were already taking a medication with the same name, and this prescription was added. Make sure you understand how and when to take each.   finasteride 5 MG tablet Commonly known as:  PROSCAR Take 5 mg by mouth daily.   folic acid 1 MG tablet Commonly known as:  FOLVITE Take 1 tablet (1 mg total) by mouth daily.   gabapentin 300 MG capsule Commonly known as:  NEURONTIN Take 300 mg by mouth 3 (three) times daily.   HYDROcodone-acetaminophen 5-325 MG tablet Commonly known as:  NORCO/VICODIN Take 1 tablet by mouth every 6 (six) hours as needed for moderate pain.   lisinopril 20 MG tablet Commonly known as:  PRINIVIL,ZESTRIL Take 20 mg by mouth daily.   metoprolol 50 MG tablet Commonly known as:  LOPRESSOR Take 50 mg by mouth 2 (two) times daily.   phenytoin 300 MG ER capsule Commonly known as:  DILANTIN Take 1 capsule (300 mg total) by mouth at bedtime.   tamsulosin 0.4 MG Caps capsule Commonly known as:  FLOMAX Take 0.4  mg by mouth.   thiamine 100 MG tablet Take 1 tablet (100 mg total) by mouth daily.        SignedEarleen Newport 11/08/2016, 11:02 AM

## 2016-11-08 NOTE — Progress Notes (Signed)
Patient ready for discharge to home; daughter at bedside; discharge instructions given and reviewed;Rx's given; patient discharged out via wheelchair.

## 2016-11-10 LAB — BPAM RBC
Blood Product Expiration Date: 201804132359
Blood Product Expiration Date: 201804132359
ISSUE DATE / TIME: 201803221616
ISSUE DATE / TIME: 201803221616
UNIT TYPE AND RH: 5100
Unit Type and Rh: 5100

## 2016-11-10 LAB — TYPE AND SCREEN
ABO/RH(D): O POS
Antibody Screen: NEGATIVE
UNIT DIVISION: 0
Unit division: 0

## 2016-11-18 ENCOUNTER — Encounter (HOSPITAL_COMMUNITY): Payer: Self-pay

## 2016-11-18 ENCOUNTER — Other Ambulatory Visit (HOSPITAL_COMMUNITY): Payer: Self-pay | Admitting: Neurosurgery

## 2016-11-18 ENCOUNTER — Ambulatory Visit (HOSPITAL_COMMUNITY)
Admission: RE | Admit: 2016-11-18 | Discharge: 2016-11-18 | Disposition: A | Discharge status: Home / Self Care | Payer: Self-pay | Source: Ambulatory Visit | Attending: Neurosurgery | Admitting: Neurosurgery

## 2016-11-18 DIAGNOSIS — D320 Benign neoplasm of cerebral meninges: Secondary | ICD-10-CM | POA: Insufficient documentation

## 2016-11-18 DIAGNOSIS — G9751 Postprocedural hemorrhage and hematoma of a nervous system organ or structure following a nervous system procedure: Secondary | ICD-10-CM | POA: Insufficient documentation

## 2016-11-18 DIAGNOSIS — D329 Benign neoplasm of meninges, unspecified: Secondary | ICD-10-CM

## 2016-11-18 MED ORDER — IOPAMIDOL (ISOVUE-300) INJECTION 61%
INTRAVENOUS | Status: AC
  Administered 2016-11-18: 75 mL
  Filled 2016-11-18: qty 75

## 2017-03-05 ENCOUNTER — Other Ambulatory Visit: Payer: Self-pay | Admitting: Pediatrics

## 2017-03-05 ENCOUNTER — Ambulatory Visit
Admission: RE | Admit: 2017-03-05 | Discharge: 2017-03-05 | Disposition: A | Discharge status: Home / Self Care | Payer: Disability Insurance | Source: Ambulatory Visit | Attending: Pediatrics | Admitting: Pediatrics

## 2017-03-05 DIAGNOSIS — M160 Bilateral primary osteoarthritis of hip: Secondary | ICD-10-CM | POA: Insufficient documentation

## 2017-03-05 DIAGNOSIS — I700 Atherosclerosis of aorta: Secondary | ICD-10-CM | POA: Diagnosis not present

## 2017-03-05 DIAGNOSIS — M5136 Other intervertebral disc degeneration, lumbar region: Secondary | ICD-10-CM | POA: Insufficient documentation

## 2017-03-05 DIAGNOSIS — G5793 Unspecified mononeuropathy of bilateral lower limbs: Secondary | ICD-10-CM | POA: Diagnosis not present

## 2017-03-05 DIAGNOSIS — R569 Unspecified convulsions: Secondary | ICD-10-CM | POA: Insufficient documentation

## 2017-03-05 DIAGNOSIS — M170 Bilateral primary osteoarthritis of knee: Secondary | ICD-10-CM | POA: Diagnosis not present

## 2017-03-05 DIAGNOSIS — G629 Polyneuropathy, unspecified: Secondary | ICD-10-CM

## 2017-03-05 DIAGNOSIS — I7090 Unspecified atherosclerosis: Secondary | ICD-10-CM | POA: Diagnosis not present

## 2017-03-05 DIAGNOSIS — I10 Essential (primary) hypertension: Secondary | ICD-10-CM | POA: Insufficient documentation

## 2017-07-02 ENCOUNTER — Other Ambulatory Visit: Payer: Self-pay

## 2017-07-02 ENCOUNTER — Encounter: Payer: Self-pay | Admitting: Urology

## 2017-07-02 ENCOUNTER — Ambulatory Visit: Payer: Self-pay | Admitting: Urology

## 2017-07-02 VITALS — BP 151/83 | HR 93 | Temp 98.8°F | Wt 243.1 lb

## 2017-07-02 DIAGNOSIS — D320 Benign neoplasm of cerebral meninges: Secondary | ICD-10-CM

## 2017-07-02 DIAGNOSIS — I10 Essential (primary) hypertension: Secondary | ICD-10-CM

## 2017-07-02 MED ORDER — LISINOPRIL 20 MG PO TABS: 20.0000 mg | ORAL_TABLET | Freq: Every day | ORAL | 3 refills | Status: DC

## 2017-07-02 MED ORDER — NICOTINE 10 MG IN INHA: RESPIRATORY_TRACT | 0 refills | Status: DC

## 2017-07-02 NOTE — Progress Notes (Signed)
Patient: Frederick Drake Male    DOB: 10/19/1960   56 y.o.   MRN: [id number] Visit Date: 07/02/2017  Today's Provider: Zara Council, PA-C   Chief Complaint  Patient presents with  . Dizziness    hx of history, feels like if he gets up he will fall; med refill  . Leg Pain    left   Subjective:    HPI  S/p left temporal craniotomy for meningioma on 11/06/2016 with Dr. Ashok Pall and Dr. Erline Levine - has not had follow up since the sutures were removed  He is having dizzy spells x 3 days - feels like he is lightheaded and going to fall - lasting for a few minutes at a time - nothing makes it worse - sitting helps the dizziness - drinks a lot of water - no chest pain or SOB - gets blurry vision - having several spells during the day - dizzy spells wake him up - some ringing in his ears  Left leg from the knee down, it swells from time to time x 1 1/2 years - hx of left ankle fracture x 3 years ago  Has a history of seizures that started one year ago     Allergies  Allergen Reactions  . Chantix [Varenicline]   . Wasp Venom Swelling  . Penicillins Rash    Has patient had a PCN reaction causing immediate rash, facial/tongue/throat swelling, SOB or lightheadedness with hypotension:YES Has patient had a PCN reaction causing severe rash involving mucus membranes or skin necrosis: NO Has patient had a PCN reaction that required hospitalization NO Has patient had a PCN reaction occurring within the last 10 years: NO If all of the above answers are "NO", then may proceed with Cephalosporin use.   This SmartLink is deprecated. Use AVSMEDLIST instead to display the medication list for a patient.  Review of Systems  Constitutional: Negative.   HENT: Negative.   Eyes: Negative.   Respiratory: Negative.   Cardiovascular: Negative.   Gastrointestinal: Negative.   Endocrine: Negative.   Allergic/Immunologic: Negative.   Neurological: Positive for dizziness.  Hematological:  Negative.   Psychiatric/Behavioral: Negative.     Social History   Tobacco Use  . Smoking status: Current Every Day Smoker    Packs/day: 2.00    Years: 40.00    Pack years: 80.00    Types: Cigarettes  . Smokeless tobacco: Never Used  Substance Use Topics  . Alcohol use: Yes    Alcohol/week: 7.2 oz    Types: 12 Cans of beer per week    Comment: 12 day   Objective:   BP (!) 151/83   Pulse 93   Temp 98.8 F (37.1 C)   Wt 243 lb 1.6 oz (110.3 kg)   BMI 32.97 kg/m   Physical Exam Constitutional: Well nourished. Alert and oriented, No acute distress. HEENT: [place] AT, moist mucus membranes. Trachea midline, no masses. Cardiovascular: No clubbing, cyanosis, or edema. Respiratory: Normal respiratory effort, no increased work of breathing. Skin: No rashes, bruises or suspicious lesions. Lymph: No cervical or inguinal adenopathy. Neurologic: Grossly intact, no focal deficits, moving all 4 extremities. Psychiatric: Normal mood and affect.      Assessment & Plan:  1. Hx of meningioma   - s/p left temporal craniotomy for the tumor  - WHO grade 1 meningioma  - needs to fill out charity care for [place] for neurology referral  2. Dizzy spells  - ? Of ETOH withdrawal   - check  labs and eye exam  3. HTN  - start lisinopril 20 mg   - RTC for BP check  4. Chronic leg pain  - appointment with Dr. Darcey Nora, PA-C   [facility]

## 2017-07-03 LAB — LIPID PANEL
CHOL/HDL RATIO: 4.6 ratio (ref 0.0–5.0)
Cholesterol, Total: 223 mg/dL — ABNORMAL HIGH (ref 100–199)
HDL: 49 mg/dL (ref >39)
LDL Calculated: 146 mg/dL — ABNORMAL HIGH (ref 0–99)
TRIGLYCERIDES: 141 mg/dL (ref 0–149)
VLDL CHOLESTEROL CAL: 28 mg/dL (ref 5–40)

## 2017-07-03 LAB — CBC WITH DIFFERENTIAL/PLATELET
BASOS: 0 %
Basophils Absolute: 0 10*3/uL (ref 0.0–0.2)
EOS (ABSOLUTE): 0 10*3/uL (ref 0.0–0.4)
EOS: 0 %
HEMATOCRIT: 47.3 % (ref 37.5–51.0)
HEMOGLOBIN: 16.2 g/dL (ref 13.0–17.7)
IMMATURE GRANS (ABS): 0 10*3/uL (ref 0.0–0.1)
IMMATURE GRANULOCYTES: 0 %
LYMPHS: 17 %
Lymphocytes Absolute: 1.7 10*3/uL (ref 0.7–3.1)
MCH: 30.9 pg (ref 26.6–33.0)
MCHC: 34.2 g/dL (ref 31.5–35.7)
MCV: 90 fL (ref 79–97)
MONOCYTES: 9 %
MONOS ABS: 0.9 10*3/uL (ref 0.1–0.9)
Neutrophils Absolute: 7.1 10*3/uL — ABNORMAL HIGH (ref 1.4–7.0)
Neutrophils: 74 %
Platelets: 222 10*3/uL (ref 150–379)
RBC: 5.25 x10E6/uL (ref 4.14–5.80)
RDW: 13.5 % (ref 12.3–15.4)
WBC: 9.7 10*3/uL (ref 3.4–10.8)

## 2017-07-03 LAB — COMPREHENSIVE METABOLIC PANEL
A/G RATIO: 1.5 (ref 1.2–2.2)
ALBUMIN: 4.4 g/dL (ref 3.5–5.5)
ALT: 14 IU/L (ref 0–44)
AST: 14 IU/L (ref 0–40)
Alkaline Phosphatase: 77 IU/L (ref 39–117)
BUN/Creatinine Ratio: 6 — ABNORMAL LOW (ref 9–20)
BUN: 6 mg/dL (ref 6–24)
Bilirubin Total: 0.3 mg/dL (ref 0.0–1.2)
CALCIUM: 9.5 mg/dL (ref 8.7–10.2)
CO2: 28 mmol/L (ref 20–29)
Chloride: 95 mmol/L — ABNORMAL LOW (ref 96–106)
Creatinine, Ser: 0.93 mg/dL (ref 0.76–1.27)
GFR, EST AFRICAN AMERICAN: 106 mL/min/{1.73_m2} (ref >59)
GFR, EST NON AFRICAN AMERICAN: 91 mL/min/{1.73_m2} (ref >59)
GLOBULIN, TOTAL: 3 g/dL (ref 1.5–4.5)
Glucose: 130 mg/dL — ABNORMAL HIGH (ref 65–99)
POTASSIUM: 4.1 mmol/L (ref 3.5–5.2)
SODIUM: 138 mmol/L (ref 134–144)
TOTAL PROTEIN: 7.4 g/dL (ref 6.0–8.5)

## 2017-07-03 LAB — HEMOGLOBIN A1C
Est. average glucose Bld gHb Est-mCnc: 123 mg/dL
Hgb A1c MFr Bld: 5.9 % — ABNORMAL HIGH (ref 4.8–5.6)

## 2017-07-03 LAB — TSH: TSH: 2.88 u[IU]/mL (ref 0.450–4.500)

## 2017-07-08 ENCOUNTER — Ambulatory Visit: Payer: Self-pay | Admitting: Specialist

## 2017-07-08 DIAGNOSIS — M7989 Other specified soft tissue disorders: Secondary | ICD-10-CM

## 2017-07-08 NOTE — Progress Notes (Signed)
   Subjective:    Patient ID: Frederick Drake, male    DOB: [birth year], 56 y.o.   MRN: [id number]  HPI Orthopaedically: there is a hx of a fall 2 years ago with a Fx. Left distal fibula treated with a boot. 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 He has been on gabapentin in the past but is not currently. He complains of swelling of the L  lower leg.    Review of Systems     Objective:   Physical Exam Gait normal. Calf measurements equal. Figure-of-8 shows L 3 CM. > R. This is secondary to the distal fibula fx. He has total loss of sensation bilaterally below the ankle. He has loss of proprioception.       Assessment & Plan:  We have nothing to offer. We can not help him with SSDI due to clinic rules. He has an Forensic psychologist. He has a appt with neurology for a follow up on tumor.

## 2017-07-15 ENCOUNTER — Encounter: Payer: Self-pay | Admitting: Neurology

## 2017-07-15 ENCOUNTER — Telehealth: Payer: Self-pay

## 2017-07-15 NOTE — Telephone Encounter (Signed)
LMOM

## 2017-07-15 NOTE — Telephone Encounter (Signed)
-----   Message from Nori Riis, PA-C sent at 07/14/2017  6:38 PM EST ----- Please let Frederick Drake know that his labs were okay for the exception of his hemoglobin A1c and cholesterol.  These labs are slightly elevated.  It would be best to address these with diet and exercise at first.  Avoiding red meat, butter, fried foods, cheese, and other foods that have a lot of saturated fat, losing weight (if you are overweight) and being more active.  I would recheck his Hbg A1c and lipids in one month.  If the labs do not improve, we will need to start medications.

## 2017-07-20 NOTE — Telephone Encounter (Signed)
Cant get in touch with pt. Will send a letter.

## 2017-07-23 ENCOUNTER — Ambulatory Visit: Payer: Self-pay | Admitting: Adult Health Nurse Practitioner

## 2017-07-23 ENCOUNTER — Ambulatory Visit: Payer: Self-pay | Admitting: Ophthalmology

## 2017-07-23 VITALS — BP 145/82 | HR 71 | Temp 97.9°F | Wt 244.5 lb

## 2017-07-23 DIAGNOSIS — I10 Essential (primary) hypertension: Secondary | ICD-10-CM

## 2017-07-23 DIAGNOSIS — M79604 Pain in right leg: Secondary | ICD-10-CM

## 2017-07-23 DIAGNOSIS — M79605 Pain in left leg: Secondary | ICD-10-CM

## 2017-07-23 DIAGNOSIS — M79606 Pain in leg, unspecified: Secondary | ICD-10-CM | POA: Insufficient documentation

## 2017-07-23 DIAGNOSIS — M25512 Pain in left shoulder: Secondary | ICD-10-CM | POA: Insufficient documentation

## 2017-07-23 DIAGNOSIS — G8929 Other chronic pain: Secondary | ICD-10-CM

## 2017-07-23 MED ORDER — MELOXICAM 7.5 MG PO TABS: 7.5000 mg | ORAL_TABLET | Freq: Every day | ORAL | 0 refills | Status: DC | PRN

## 2017-07-23 MED ORDER — LISINOPRIL 40 MG PO TABS: 40.0000 mg | ORAL_TABLET | Freq: Every day | ORAL | 3 refills | Status: DC

## 2017-07-23 NOTE — Progress Notes (Signed)
  Patient: Frederick Drake Male    DOB: November 09, 1960   56 y.o.   MRN: [id number] Visit Date: 07/23/2017  Today's Provider: Staci Acosta, NP   Chief Complaint  Patient presents with  . Follow-up   Subjective:    HPI   Started on Lisinopril 20mg  daily at last visit.  BP was 151/83 on last visit.   Dizziness somewhat improved- intermittent.   Reports having chronic shoulder and leg pain.  Saw Dr. Vickki Hearing- no interventions at this time.  Reports pain is constant, rates pain 10/10 always.  No successful interventions to manage the pain- states ibuprofen eases the pain minimally.       Allergies  Allergen Reactions  . Chantix [Varenicline]   . Wasp Venom Swelling  . Penicillins Rash    Has patient had a PCN reaction causing immediate rash, facial/tongue/throat swelling, SOB or lightheadedness with hypotension:YES Has patient had a PCN reaction causing severe rash involving mucus membranes or skin necrosis: NO Has patient had a PCN reaction that required hospitalization NO Has patient had a PCN reaction occurring within the last 10 years: NO If all of the above answers are "NO", then may proceed with Cephalosporin use.   This SmartLink is deprecated. Use AVSMEDLIST instead to display the medication list for a patient.  Review of Systems  All other systems reviewed and are negative.   Social History   Tobacco Use  . Smoking status: Current Every Day Smoker    Packs/day: 2.00    Years: 40.00    Pack years: 80.00    Types: Cigarettes  . Smokeless tobacco: Never Used  Substance Use Topics  . Alcohol use: Yes    Alcohol/week: 7.2 oz    Types: 12 Cans of beer per week    Comment: 12 day   Objective:   BP (!) 145/82 (BP Location: Left Arm, Patient Position: Sitting, Cuff Size: Normal)   Pulse 71   Temp 97.9 F (36.6 C) (Oral)   Wt 244 lb 8 oz (110.9 kg)   BMI 33.16 kg/m   Physical Exam  Constitutional: He is oriented to person, place, and time. He appears  well-developed and well-nourished.  Cardiovascular: Normal rate, regular rhythm and normal heart sounds.  Pulmonary/Chest: Effort normal and breath sounds normal.  Abdominal: Soft. Bowel sounds are normal.  Neurological: He is alert and oriented to person, place, and time.  Skin: Skin is warm and dry.  Vitals reviewed.       Assessment & Plan:         HTN:  Not controlled.  Goal BP <140/90.  Increase Lisinopril to 40mg  daily. .  Encourage low salt diet and exercise.  Repeat BMP today for stability.  FU in 4 weeks for repeat BP.   Discussed diet modifications and lifestyle changes to reduce LDL of 146 and A1c of 5.9.   Pending appointment with Neurology on 2.13.19 with Dr. Delice Lesch.  Pending charity care application.   Will prescribe Meloxicam PRN for leg/shoulder pain.    Staci Acosta, NP   [facility]

## 2017-07-23 NOTE — Patient Instructions (Signed)
Cholesterol Cholesterol is a fat. Your body needs a small amount of cholesterol. Cholesterol (plaque) may build up in your blood vessels (arteries). That makes you more likely to have a heart attack or stroke. You cannot feel your cholesterol level. Having a blood test is the only way to find out if your level is high. Keep your test results. Work with your doctor to keep your cholesterol at a good level. What do the results mean?  Total cholesterol is how much cholesterol is in your blood.  LDL is bad cholesterol. This is the type that can build up. Try to have low LDL.  HDL is good cholesterol. It cleans your blood vessels and carries LDL away. Try to have high HDL.  Triglycerides are fat that the body can store or burn for energy. What are good levels of cholesterol?  Total cholesterol below 200.  LDL below 100 is good for people who have health risks. LDL below 70 is good for people who have very high risks.  HDL above 40 is good. It is best to have HDL of 60 or higher.  Triglycerides below 150. How can I lower my cholesterol? Diet Follow your diet program as told by your doctor.  Choose fish, white meat chicken, or [place] that is roasted or baked. Try not to eat red meat, fried foods, sausage, or lunch meats.  Eat lots of fresh fruits and vegetables.  Choose whole grains, beans, pasta, potatoes, and cereals.  Choose olive oil, corn oil, or canola oil. Only use small amounts.  Try not to eat butter, mayonnaise, shortening, or palm kernel oils.  Try not to eat foods with trans fats.  Choose low-fat or nonfat dairy foods. ? Drink skim or nonfat milk. ? Eat low-fat or nonfat yogurt and cheeses. ? Try not to drink whole milk or cream. ? Try not to eat ice cream, egg yolks, or full-fat cheeses.  Healthy desserts include angel food cake, ginger snaps, animal crackers, hard candy, popsicles, and low-fat or nonfat frozen yogurt. Try not to eat pastries, cakes, pies, and  cookies.  Exercise Follow your exercise program as told by your doctor.  Be more active. Try gardening, walking, and taking the stairs.  Ask your doctor about ways that you can be more active.  Medicine  Take over-the-counter and prescription medicines only as told by your doctor.  This information is not intended to replace advice given to you by your health care provider. Make sure you discuss any questions you have with your health care provider. Document Released: 10/31/2008 Document Revised: 03/05/2016 Document     Preventing Type 2 Diabetes Mellitus Type 2 diabetes (type 2 diabetes mellitus) is a long-term (chronic) disease that affects blood sugar (glucose) levels. Normally, a hormone called insulin allows glucose to enter cells in the body. The cells use glucose for energy. In type 2 diabetes, one or both of these problems may be present:  The body does not make enough insulin.  The body does not respond properly to insulin that it makes (insulin resistance).  Insulin resistance or lack of insulin causes excess glucose to build up in the blood instead of going into cells. As a result, high blood glucose (hyperglycemia) develops, which can cause many complications. Being overweight or obese and having an inactive (sedentary) lifestyle can increase your risk for diabetes. Type 2 diabetes can be delayed or prevented by making certain nutrition and lifestyle changes. What nutrition changes can be made?  Eat healthy meals and  snacks regularly. Keep a healthy snack with you for when you get hungry between meals, such as fruit or a handful of nuts.  Eat lean meats and proteins that are low in saturated fats, such as chicken, fish, egg whites, and beans. Avoid processed meats.  Eat plenty of fruits and vegetables and plenty of grains that have not been processed (whole grains). It is recommended that you eat: ? 1?2 cups of fruit every day. ? 2?3 cups of vegetables every  day. ? 6?8 oz of whole grains every day, such as oats, whole wheat, bulgur, brown rice, quinoa, and millet.  Eat low-fat dairy products, such as milk, yogurt, and cheese.  Eat foods that contain healthy fats, such as nuts, avocado, olive oil, and canola oil.  Drink water throughout the day. Avoid drinks that contain added sugar, such as soda or sweet tea.  Follow instructions from your health care provider about specific eating or drinking restrictions.  Control how much food you eat at a time (portion size). ? Check food labels to find out the serving sizes of foods. ? Use a kitchen scale to weigh amounts of foods.  Saute or steam food instead of frying it. Cook with water or broth instead of oils or butter.  Limit your intake of: ? Salt (sodium). Have no more than 1 tsp (2,400 mg) of sodium a day. If you have heart disease or high blood pressure, have less than ? tsp (1,500 mg) of sodium a day. ? Saturated fat. This is fat that is solid at room temperature, such as butter or fat on meat. What lifestyle changes can be made?  Activity  Do moderate-intensity physical activity for at least 30 minutes on at least 5 days of the week, or as much as told by your health care provider.  Ask your health care provider what activities are safe for you. A mix of physical activities may be best, such as walking, swimming, cycling, and strength training.  Try to add physical activity into your day. For example: ? Park in spots that are farther away than usual, so that you walk more. For example, park in a far corner of the parking lot when you go to the office or the grocery store. ? Take a walk during your lunch break. ? Use stairs instead of elevators or escalators. Weight Loss  Lose weight as directed. Your health care provider can determine how much weight loss is best for you and can help you lose weight safely.  If you are overweight or obese, you may be instructed to lose at least 5?7  % of your body weight. Alcohol and Tobacco   Limit alcohol intake to no more than 1 drink a day for nonpregnant women and 2 drinks a day for men. One drink equals 12 oz of beer, 5 oz of wine, or 1 oz of hard liquor.  Do not use any tobacco products, such as cigarettes, chewing tobacco, and e-cigarettes. If you need help quitting, ask your health care provider. Work With [facility] Provider  Have your blood glucose tested regularly, as told by your health care provider.  Discuss your risk factors and how you can reduce your risk for diabetes.  Get screening tests as told by your health care provider. You may have screening tests regularly, especially if you have certain risk factors for type 2 diabetes.  Make an appointment with a diet and nutrition specialist (registered dietitian). A registered dietitian can help you make  a healthy eating plan and can help you understand portion sizes and food labels. Why are these changes important?  It is possible to prevent or delay type 2 diabetes and related health problems by making lifestyle and nutrition changes.  It can be difficult to recognize signs of type 2 diabetes. The best way to avoid possible damage to your body is to take actions to prevent the disease before you develop symptoms. What can happen if changes are not made?  Your blood glucose levels may keep increasing. Having high blood glucose for a long time is dangerous. Too much glucose in your blood can damage your blood vessels, heart, kidneys, nerves, and eyes.  You may develop prediabetes or type 2 diabetes. Type 2 diabetes can lead to many chronic health problems and complications, such as: ? Heart disease. ? Stroke. ? Blindness. ? Kidney disease. ? Depression. ? Poor circulation in the feet and legs, which could lead to surgical removal (amputation) in severe cases. Where to find support:  Ask your health care provider to recommend a registered dietitian, diabetes  educator, or weight loss program.  Look for local or online weight loss groups.  Join a gym, fitness club, or outdoor activity group, such as a walking club. Where to find more information: To learn more about diabetes and diabetes prevention, visit:  American Diabetes Association (ADA): www.diabetes.[employer] of Diabetes and Digestive and Kidney Diseases: FindSpin.nl  To learn more about healthy eating, visit:  The U.S. Department of Agriculture Scientist, research (physical sciences)), Choose My Plate: http://wiley-williams.com/  Office of Disease Prevention and Health Promotion (ODPHP), Dietary Guidelines: SurferLive.at  Summary  You can reduce your risk for type 2 diabetes by increasing your physical activity, eating healthy foods, and losing weight as directed.  Talk with your health care provider about your risk for type 2 diabetes. Ask about any blood tests or screening tests that you need to have. This information is not intended to replace advice given to you by your health care provider. Make sure you discuss any questions you have with your health care provider. Document Released: 11/26/2015 Document Revised: 01/10/2016 Document Reviewed: 09/25/2015 Elsevier Interactive Patient Education  2018 [place].  Reviewed: 02/14/2016 Elsevier Interactive Patient Education  2017 [employer].

## 2017-07-24 LAB — BASIC METABOLIC PANEL
BUN / CREAT RATIO: 11 (ref 9–20)
BUN: 9 mg/dL (ref 6–24)
CHLORIDE: 97 mmol/L (ref 96–106)
CO2: 27 mmol/L (ref 20–29)
Calcium: 9.9 mg/dL (ref 8.7–10.2)
Creatinine, Ser: 0.79 mg/dL (ref 0.76–1.27)
GFR calc Af Amer: 116 mL/min/{1.73_m2} (ref >59)
GFR calc non Af Amer: 100 mL/min/{1.73_m2} (ref >59)
GLUCOSE: 81 mg/dL (ref 65–99)
Potassium: 4.6 mmol/L (ref 3.5–5.2)
SODIUM: 139 mmol/L (ref 134–144)

## 2017-07-28 ENCOUNTER — Ambulatory Visit: Payer: Disability Insurance | Admitting: Pharmacy Technician

## 2017-08-20 ENCOUNTER — Ambulatory Visit: Payer: Self-pay

## 2017-08-24 ENCOUNTER — Telehealth: Payer: Self-pay | Admitting: Pharmacy Technician

## 2017-08-24 NOTE — Telephone Encounter (Signed)
Pt approved to receive medication assistance at [facility] through 2019, as long as eligibility criteria continues to be met.  Frederick Drake J. Bridget Westbrooks Care Manager [facility] 

## 2017-09-30 ENCOUNTER — Ambulatory Visit: Payer: Disability Insurance | Admitting: Neurology

## 2018-01-20 ENCOUNTER — Other Ambulatory Visit: Payer: Self-pay | Admitting: Adult Health Nurse Practitioner

## 2018-01-29 ENCOUNTER — Telehealth: Payer: Self-pay | Admitting: Pharmacy Technician

## 2018-01-29 NOTE — Telephone Encounter (Signed)
Patient has full Medicaid.  No longer meets [facility]'s eligibility criteria.  Patient notified by letter.  [facility]

## 2018-03-05 ENCOUNTER — Other Ambulatory Visit: Payer: Self-pay | Admitting: Adult Health

## 2018-03-05 ENCOUNTER — Ambulatory Visit: Payer: Medicaid Other | Admitting: Adult Health

## 2018-03-05 ENCOUNTER — Encounter: Payer: Self-pay | Admitting: Adult Health

## 2018-03-05 VITALS — BP 148/88 | HR 62 | Resp 16 | Ht 72.0 in | Wt 225.0 lb

## 2018-03-05 DIAGNOSIS — R200 Anesthesia of skin: Secondary | ICD-10-CM | POA: Diagnosis not present

## 2018-03-05 DIAGNOSIS — E782 Mixed hyperlipidemia: Secondary | ICD-10-CM

## 2018-03-05 DIAGNOSIS — D429 Neoplasm of uncertain behavior of meninges, unspecified: Secondary | ICD-10-CM

## 2018-03-05 DIAGNOSIS — I10 Essential (primary) hypertension: Secondary | ICD-10-CM

## 2018-03-05 DIAGNOSIS — G622 Polyneuropathy due to other toxic agents: Secondary | ICD-10-CM

## 2018-03-05 DIAGNOSIS — R202 Paresthesia of skin: Secondary | ICD-10-CM

## 2018-03-05 DIAGNOSIS — M79605 Pain in left leg: Secondary | ICD-10-CM

## 2018-03-05 DIAGNOSIS — M79604 Pain in right leg: Secondary | ICD-10-CM | POA: Diagnosis not present

## 2018-03-05 DIAGNOSIS — E119 Type 2 diabetes mellitus without complications: Secondary | ICD-10-CM

## 2018-03-05 DIAGNOSIS — R300 Dysuria: Secondary | ICD-10-CM

## 2018-03-05 LAB — POCT GLYCOSYLATED HEMOGLOBIN (HGB A1C): HEMOGLOBIN A1C: 4.6 % (ref 4.0–5.6)

## 2018-03-05 MED ORDER — PNEUMOCOCCAL 13-VAL CONJ VACC IM SUSP: 0.5000 mL | INTRAMUSCULAR | 0 refills | Status: AC

## 2018-03-05 MED ORDER — LISINOPRIL 40 MG PO TABS: 40.0000 mg | ORAL_TABLET | Freq: Every day | ORAL | 3 refills | Status: DC

## 2018-03-05 MED ORDER — GABAPENTIN 100 MG PO CAPS: 100.0000 mg | ORAL_CAPSULE | Freq: Three times a day (TID) | ORAL | 3 refills | Status: DC

## 2018-03-05 MED ORDER — GABAPENTIN 100 MG PO CAPS: 100.0000 mg | ORAL_CAPSULE | Freq: Three times a day (TID) | ORAL | 2 refills | Status: DC

## 2018-03-05 NOTE — Progress Notes (Signed)
[facility] [place], [place] [address]  Internal MEDICINE  Office Visit Note  Patient Name: DAY DEERY  [id number]  [id number]  Date of Service: 03/07/2018   Complaints/HPI Pt is here for establishment of PCP. Chief Complaint  Patient presents with  . Hypertension    new patient establishing care , needs colonoscopy, pneumonia vaccination, denies diabetes   HPI  Patient is here for establishment PCP. C/o pain in numbness of BLE, was treated with gabapentin with some relief. There is h/o of ETOH abuse in the past where pt developed SZ and required intubation. His labs were positive for cannabinoids and Alcohol. Incidental findings on his MRI  were as follows. Pt did have a craniotomy and right sided was resected in 2018 10/17/2016 3.2 x 5.6 x 4.8 cm LEFT temporal occipital meningioma with local invasion including LEFT sigmoid dural venous sinus invasion with slow flow versus thrombosis. Another 3.2 x 3.4 x 1.4 cm RIGHT anterior cranial fossa meningioma with extensive vasogenic edema, potential parenchymal invasion.  Pt is also a heavy smoker, Xray of right knee  do show atherosclerosis. BP seems to be not under control. There might be a question of non adherence to therapy. He seems to be a poor historian    Current Medication: Outpatient Encounter Medications as of 03/05/2018  Medication Sig  . [DISCONTINUED] lisinopril (PRINIVIL,ZESTRIL) 40 MG tablet TAKE ONE TABLET BY MOUTH EVERY DAY  . [DISCONTINUED] pneumococcal 13-valent conjugate vaccine (PREVNAR 13) SUSP injection Inject 0.5 mLs into the muscle tomorrow at 10 am.  . finasteride (PROSCAR) 5 MG tablet Take 5 mg by mouth daily.  Marland Kitchen gabapentin (NEURONTIN) 100 MG capsule Take 1 capsule (100 mg total) by mouth 3 (three) times daily.  . meloxicam (MOBIC) 7.5 MG tablet Take 1 tablet (7.5 mg total) by mouth daily as needed for pain. (Patient not taking: Reported on 03/05/2018)  . nicotine (NICOTROL) 10 MG  inhaler 6-16 catridges/day inhaled x 6-12 weeks, then taper dose over 6-12 week to discontinue smoking (Patient not taking: Reported on 03/05/2018)  . tamsulosin (FLOMAX) 0.4 MG CAPS capsule Take 0.4 mg by mouth.  . [DISCONTINUED] gabapentin (NEURONTIN) 100 MG capsule Take 1 capsule (100 mg total) by mouth 3 (three) times daily.  . [DISCONTINUED] gabapentin (NEURONTIN) 100 MG capsule Take 1 capsule (100 mg total) by mouth 3 (three) times daily.   No facility-administered encounter medications on file as of 03/05/2018.    Surgical History: Past Surgical History:  Procedure Laterality Date  . CRANIOTOMY Left 11/06/2016   Procedure: LEFT Temporal CRANIOTOMY FOR TUMOR;  Surgeon: Ashok Pall, MD;  Location: [facility];  Service: Neurosurgery;  Laterality: Left;  LEFT Temproal CRANIOTOMY FOR TUMOR   . HERNIA REPAIR    . VASECTOMY     Medical History: Past Medical History:  Diagnosis Date  . Allergy    Seasonal  . Brain tumor (benign) ([place])   . Carpal tunnel syndrome   . Headache   . Hypertension   . Neuropathy    Both legs  . Seizures ([place])    Family History: Family History  Problem Relation Age of Onset  . Cancer Sister        Breast   Social History   Socioeconomic History  . Marital status: Married    Spouse name: Not on file  . Number of children: Not on file  . Years of education: Not on file  . Highest education level: Not on file  Occupational History  .  Not on file  Social Needs  . Financial resource strain: Not on file  . Food insecurity:    Worry: Not on file    Inability: Not on file  . Transportation needs:    Medical: Not on file    Non-medical: Not on file  Tobacco Use  . Smoking status: Current Every Day Smoker    Packs/day: 2.00    Years: 40.00    Pack years: 80.00    Types: Cigarettes  . Smokeless tobacco: Never Used  Substance and Sexual Activity  . Alcohol use: Yes    Alcohol/week: 7.2 oz    Types: 12 Cans of beer per week    Comment: 12 day  .  Drug use: No  . Sexual activity: Not on file  Lifestyle  . Physical activity:    Days per week: Not on file    Minutes per session: Not on file  . Stress: Not on file  Relationships  . Social connections:    Talks on phone: Not on file    Gets together: Not on file    Attends religious service: Not on file    Active member of club or organization: Not on file    Attends meetings of clubs or organizations: Not on file    Relationship status: Not on file  . Intimate partner violence:    Fear of current or ex partner: Not on file    Emotionally abused: Not on file    Physically abused: Not on file    Forced sexual activity: Not on file  Other Topics Concern  . Not on file  Social History Narrative  . Not on file   Review of Systems  Constitutional: Negative.  Negative for chills, fatigue and unexpected weight change.  HENT: Negative.  Negative for congestion, rhinorrhea, sneezing and sore throat.   Eyes: Negative for redness.  Respiratory: Negative.  Negative for cough, chest tightness and shortness of breath.   Cardiovascular: Negative.  Negative for chest pain and palpitations.  Gastrointestinal: Negative.  Negative for abdominal pain, constipation, diarrhea, nausea and vomiting.  Endocrine: Negative.   Genitourinary: Negative.  Negative for dysuria and frequency.  Musculoskeletal: Negative.  Negative for arthralgias, back pain, joint swelling and neck pain.  Skin: Negative.  Negative for rash.  Allergic/Immunologic: Negative.   Neurological: Negative.  Negative for tremors and numbness.  Hematological: Negative for adenopathy. Does not bruise/bleed easily.  Psychiatric/Behavioral: Negative.  Negative for behavioral problems, sleep disturbance and suicidal ideas. The patient is not nervous/anxious.    Vital Signs: BP (!) 148/88   Pulse 62   Resp 16   Ht 6' (1.829 m)   Wt 225 lb (102.1 kg)   SpO2 93%   BMI 30.52 kg/m   Physical Exam  Constitutional: He is oriented to  person, place, and time. He appears well-developed and well-nourished. No distress.  HENT:  Head: Normocephalic and atraumatic.  Mouth/Throat: Oropharynx is clear and moist. No oropharyngeal exudate.  Eyes: Pupils are equal, round, and reactive to light. EOM are normal.  Neck: Normal range of motion. Neck supple. No JVD present. No tracheal deviation present. No thyromegaly present.  Cardiovascular: Normal rate, regular rhythm and normal heart sounds. Exam reveals no gallop and no friction rub.  No murmur heard. Pulses:      Dorsalis pedis pulses are 1+ on the right side, and 1+ on the left side.       Posterior tibial pulses are 1+ on the right side, and  1+ on the left side.  Pulmonary/Chest: Effort normal and breath sounds normal. No respiratory distress. He has no wheezes. He has no rales. He exhibits no tenderness.  Abdominal: Soft. There is no tenderness. There is no guarding.  Musculoskeletal: Normal range of motion.  Feet:  Right Foot:  Skin Integrity: Negative for erythema or warmth.  Left Foot:  Skin Integrity: Negative for erythema or warmth.  Lymphadenopathy:    He has no cervical adenopathy.  Neurological: He is alert and oriented to person, place, and time. No cranial nerve deficit.  Skin: Skin is warm and dry. He is not diaphoretic.  Psychiatric: He has a normal mood and affect. His behavior is normal. Judgment and thought content normal.  Nursing note and vitals reviewed. Pts Bilateral lower extremities feel cool to touch.    Assessment/Plan Pain in both lower extremities - Condition can be multifactorial, small vessel disease due to smoking, Etoh related neuropathy or B12 deficiency  - Ankle Brachial Index Assessment (BFP), which borderline, will need to have arterial studies  - gabapentin (NEURONTIN) 100 MG capsule; Take 1 capsule (100 mg total) by mouth 3 (three) times daily.  Dispense: 90 capsule; Refill: 3   Numbness and tingling of lower extremity - CBC with  Differential/Platelet - TSH - T4, free   Type 2 diabetes mellitus without complication, without long-term current use of insulin ([place]) - Might need benfit from [employer]. Will monitor pph - POCT HgB A1C   Meningioma, multiple ([place]) - Ambulatory referral to Neurosurgery, will need further monitoring    Essential hypertension, benign - Slightly elevated today, continue meds,  - Comprehensive metabolic panel   Mixed hyperlipidemia - H/O hyperlidemia. 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 I have discussed any further diagnostic evaluation that may be needed or ordered today. We also reviewed his medications today. he has been encouraged to call the office with any questions or concerns that should arise related to todays visit.  Orders Placed This Encounter  Procedures  . Urinalysis  . CBC with Differential/Platelet  . Lipid Panel With LDL/HDL Ratio  . TSH  . T4, free  . Comprehensive metabolic panel  . Ambulatory referral to Neurosurgery  . Ankle Brachial Index Assessment (BFP)  . POCT HgB A1C    Meds ordered this encounter  Medications  . DISCONTD: gabapentin (NEURONTIN) 100 MG capsule    Sig: Take 1 capsule (100 mg total) by mouth 3 (three) times daily.    Dispense:  90 capsule    Refill:  2  . DISCONTD: gabapentin (NEURONTIN) 100 MG capsule    Sig: Take 1 capsule (100 mg total) by mouth 3 (three) times daily.    Dispense:  90 capsule    Refill:  3  . gabapentin (NEURONTIN) 100 MG capsule    Sig: Take 1 capsule (100 mg total) by mouth 3 (three) times daily.    Dispense:  90 capsule    Refill:  3    Time spent: 45 Minutes  This patient was seen by Orson Gear AGNP-C in Collaboration with Dr Lavera Guise as a part of collaborative care agreement

## 2018-03-06 LAB — LIPID PANEL WITH LDL/HDL RATIO
Cholesterol, Total: 185 mg/dL (ref 100–199)
HDL: 86 mg/dL (ref >39)
LDL CALC: 89 mg/dL (ref 0–99)
LDL/HDL RATIO: 1 ratio (ref 0.0–3.6)
TRIGLYCERIDES: 52 mg/dL (ref 0–149)
VLDL Cholesterol Cal: 10 mg/dL (ref 5–40)

## 2018-03-06 LAB — CBC WITH DIFFERENTIAL/PLATELET
BASOS: 1 %
Basophils Absolute: 0 10*3/uL (ref 0.0–0.2)
EOS (ABSOLUTE): 0.2 10*3/uL (ref 0.0–0.4)
EOS: 3 %
HEMATOCRIT: 43.5 % (ref 37.5–51.0)
HEMOGLOBIN: 14.5 g/dL (ref 13.0–17.7)
IMMATURE GRANULOCYTES: 0 %
Immature Grans (Abs): 0 10*3/uL (ref 0.0–0.1)
LYMPHS ABS: 1.7 10*3/uL (ref 0.7–3.1)
Lymphs: 32 %
MCH: 33.1 pg — ABNORMAL HIGH (ref 26.6–33.0)
MCHC: 33.3 g/dL (ref 31.5–35.7)
MCV: 99 fL — AB (ref 79–97)
MONOCYTES: 11 %
Monocytes Absolute: 0.6 10*3/uL (ref 0.1–0.9)
Neutrophils Absolute: 2.9 10*3/uL (ref 1.4–7.0)
Neutrophils: 53 %
Platelets: 131 10*3/uL — ABNORMAL LOW (ref 150–450)
RBC: 4.38 x10E6/uL (ref 4.14–5.80)
RDW: 13.2 % (ref 12.3–15.4)
WBC: 5.2 10*3/uL (ref 3.4–10.8)

## 2018-03-06 LAB — T4, FREE: Free T4: 0.73 ng/dL — ABNORMAL LOW (ref 0.82–1.77)

## 2018-03-06 LAB — TSH: TSH: 3.63 u[IU]/mL (ref 0.450–4.500)

## 2018-03-06 LAB — URINALYSIS
Bilirubin, UA: NEGATIVE
Glucose, UA: NEGATIVE
Ketones, UA: NEGATIVE
Leukocytes, UA: NEGATIVE
NITRITE UA: NEGATIVE
PH UA: 5 (ref 5.0–7.5)
Protein, UA: NEGATIVE
RBC UA: NEGATIVE
SPEC GRAV UA: 1.008 (ref 1.005–1.030)
Urobilinogen, Ur: 0.2 mg/dL (ref 0.2–1.0)

## 2018-03-08 ENCOUNTER — Telehealth: Payer: Self-pay

## 2018-03-08 NOTE — Telephone Encounter (Signed)
Renee from the pharmacy called saying pt rx for gabapentin cannot be filled because Orson Gear is not signed up for NCTracks for Medicaid. Spoke with Dr. Clayborn Bigness and she said to put her name for the prescription since she is a supervising physcian. Called Renee and let her know.

## 2018-03-09 LAB — FERRITIN: Ferritin: 401 ng/mL — ABNORMAL HIGH (ref 30–400)

## 2018-03-09 LAB — SPECIMEN STATUS REPORT

## 2018-03-09 LAB — VITAMIN B12: VITAMIN B 12: 443 pg/mL (ref 232–1245)

## 2018-03-10 ENCOUNTER — Telehealth: Payer: Self-pay

## 2018-03-10 LAB — SPECIMEN STATUS REPORT

## 2018-03-10 MED ORDER — LEVOTHYROXINE SODIUM 50 MCG PO TABS: 50.0000 ug | ORAL_TABLET | Freq: Every day | ORAL | 3 refills | Status: DC

## 2018-03-10 NOTE — Telephone Encounter (Signed)
lmom to call us back

## 2018-04-07 IMAGING — CT CT HEAD W/O CM
3 of 4 series · 14 of 47 positions shown, 16 images · non-contrast
Comparison: None.

CLINICAL DATA: Seizures.

EXAM:
CT HEAD WITHOUT CONTRAST
TECHNIQUE: Contiguous axial images were obtained from the base of the skull
through the vertex without intravenous contrast.

[Series 2: head wo · axial · 0.50mm/px · z∈[+411,+546]mm · 8 of 35 slices shown, 10 images]
[im 4/35  brain]
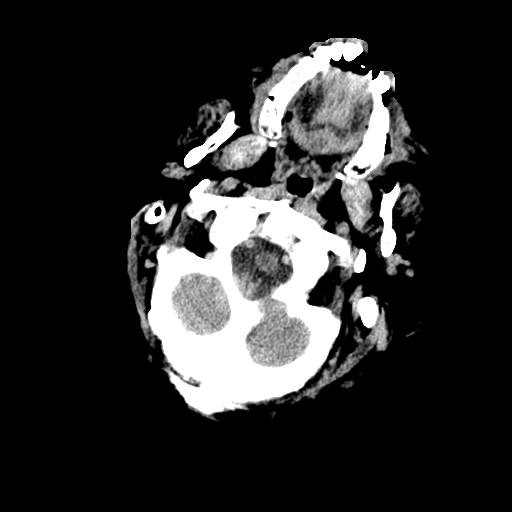
[im 4/35  bone]
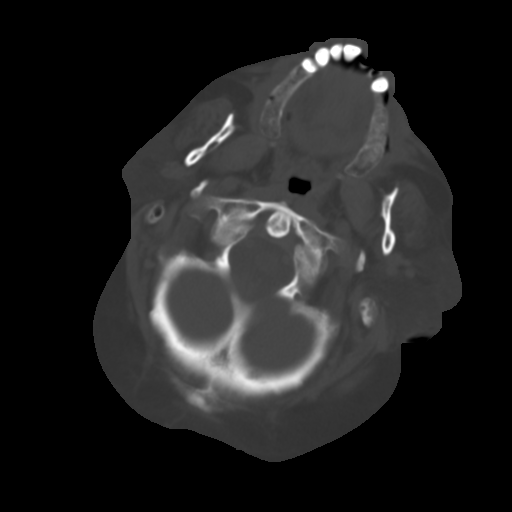
[im 8/35  brain]
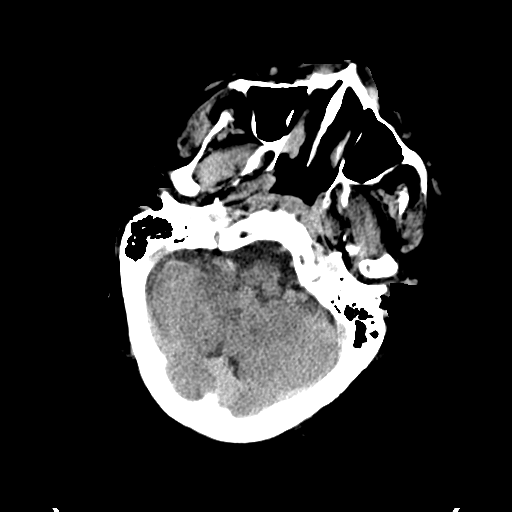
[im 12/35  brain]
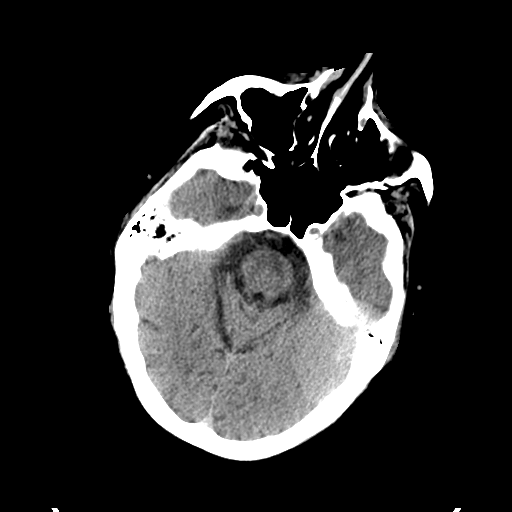
[im 16/35  brain]
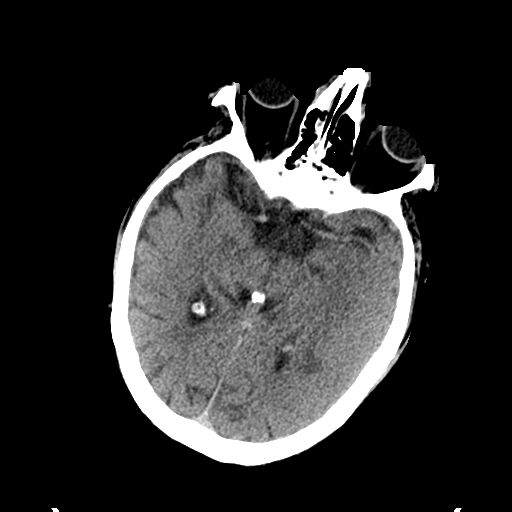
[im 19/35  brain]
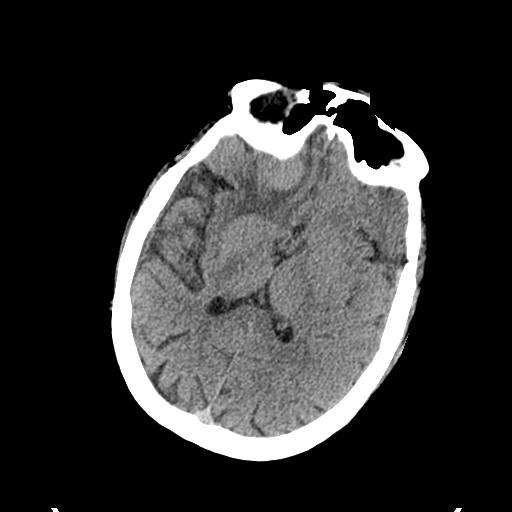
[im 19/35  bone]
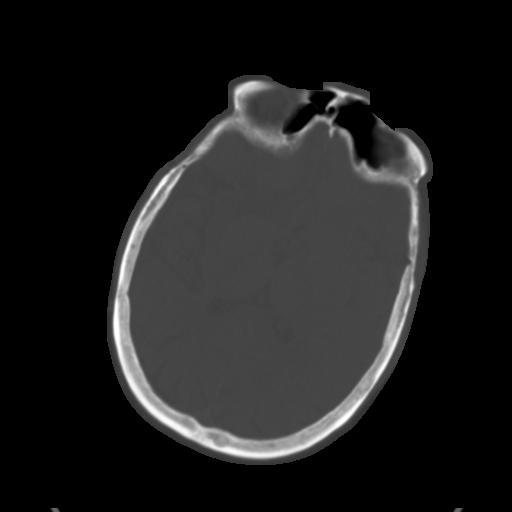
[im 23/35  brain]
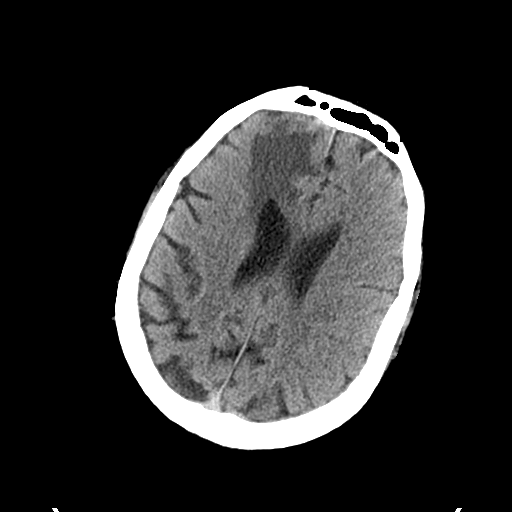
[im 27/35  brain]
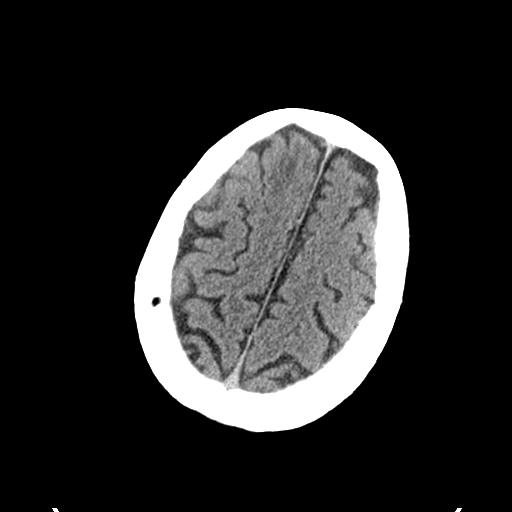
[im 31/35  brain]
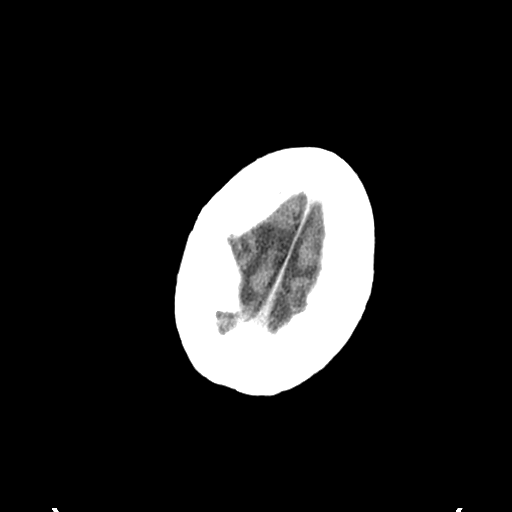

[Series 6: coronal soft tissue · coronal · 0.32mm/px · 3 of 68 slices shown]
[im 23/68  brain]
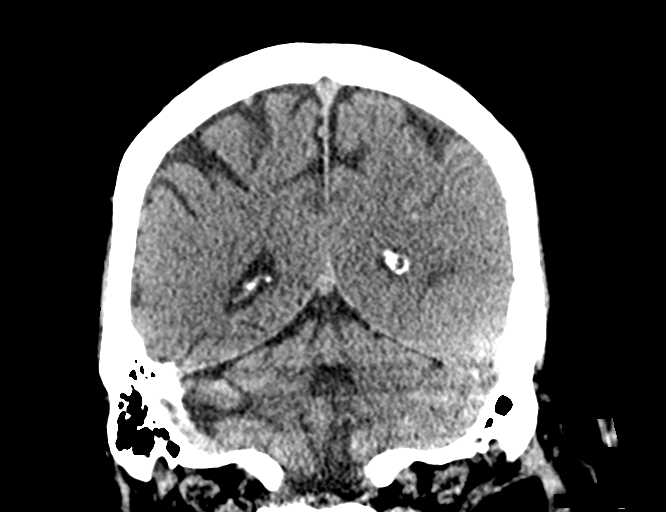
[im 30/68  brain]
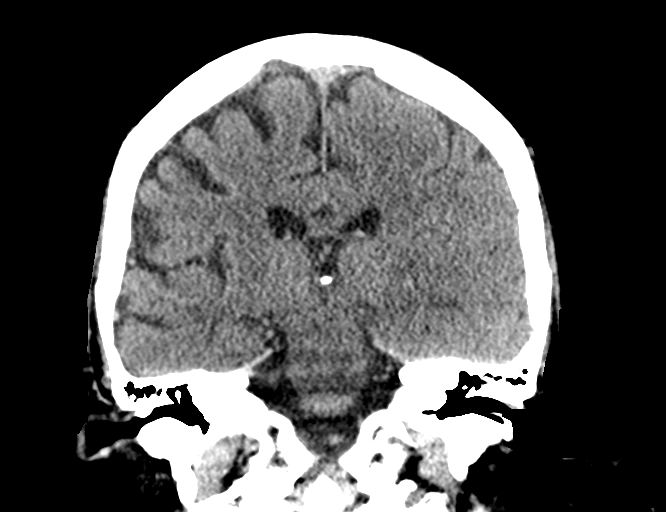
[im 38/68  brain]
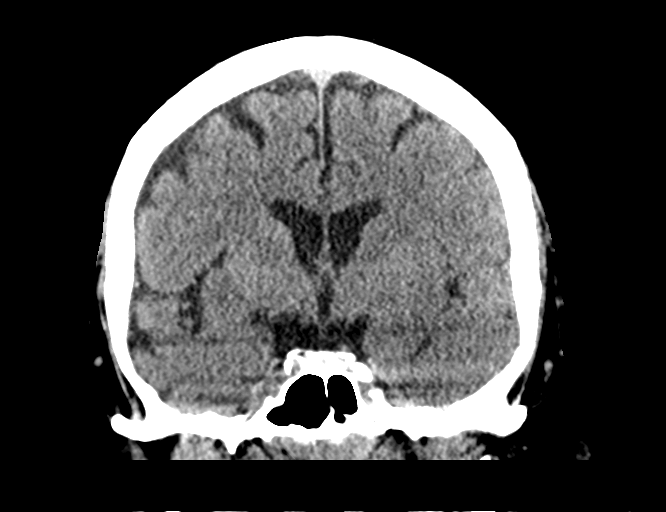

[Series 7: sagittal soft tissue · sagittal · 0.32mm/px · 3 of 54 slices shown]
[im 18/54  brain]
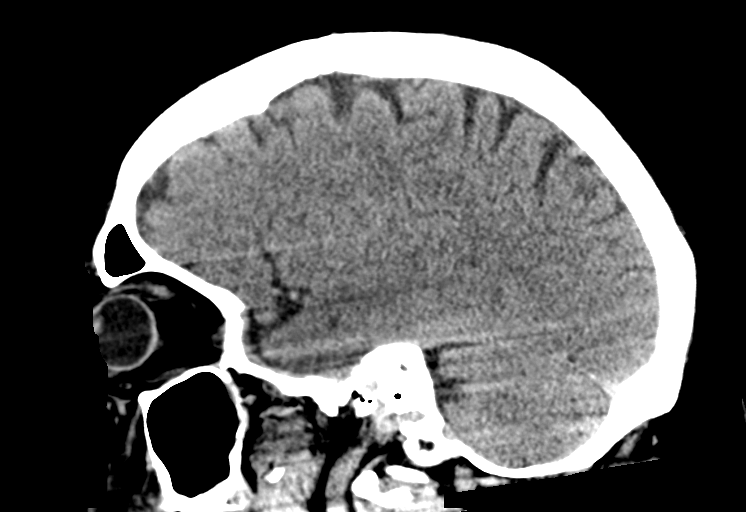
[im 27/54  brain]
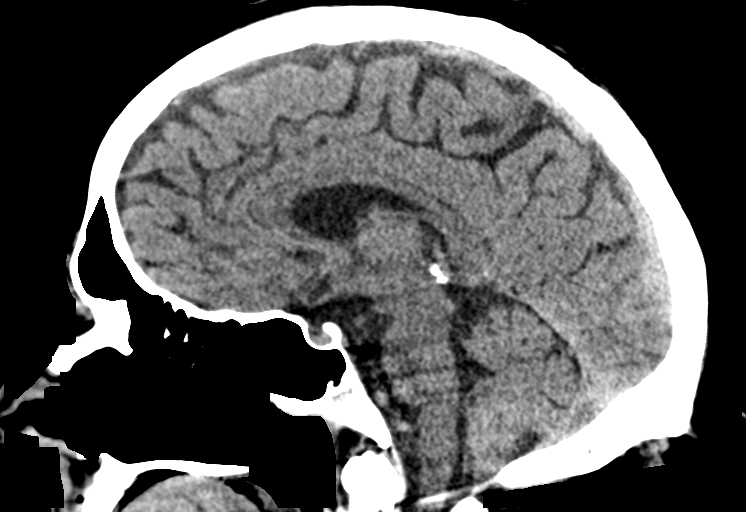
[im 36/54  brain]
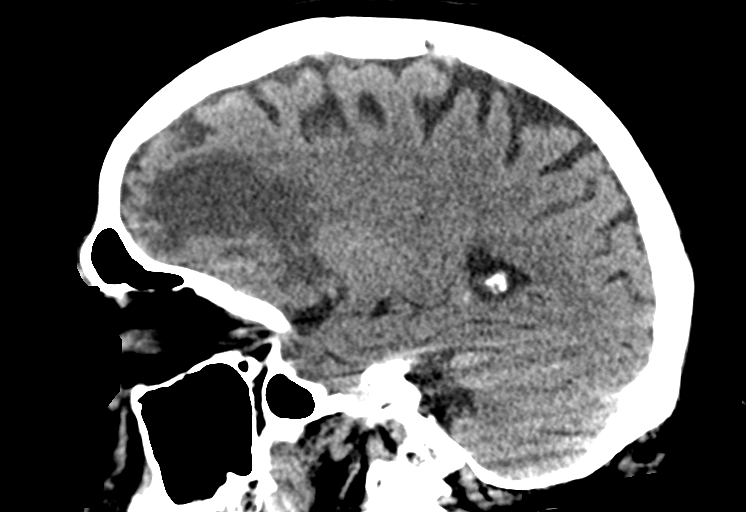

[14 of 47 positions shown; findings below may reference images not displayed]

FINDINGS: Brain: 2.3 cm rounded hyperdense mass is noted in right frontal lobe
surrounded by white matter edema. This results in 6 mm of right to
left midline shift anteriorly. Ventricular size is within normal
limits. There is no evidence of hemorrhage or acute infarction.
There appears to be in other mass measuring 3.7 x 2.6 x 2.0 cm in
the left parietal cortex just above the left tentorium ; this may
simply represent meningioma.

Vascular: No hyperdense vessel or unexpected calcification.

Skull: Normal. Negative for fracture or focal lesion.

Sinuses/Orbits: No acute finding.

Other: None.
IMPRESSION: 2.3 cm rounded hyperdense mass is noted in right frontal lobe with
surrounding white matter edema concerning for neoplasm or
malignancy. Also noted is 2.7 x 2.6 x 2.0 cm mass in left parietal
cortex above left tentorium without surrounding edema. MRI of the
brain with and without gadolinium administration is recommended for
further evaluation.

## 2018-04-07 IMAGING — DX DG ABD PORTABLE 1V
1 series · 1 of 1 positions shown · non-contrast
Comparison: Portable exam 0104 hours compared to CT abdomen and
pelvis of 11/14/2014

CLINICAL DATA: Orogastric tube placement

EXAM:
PORTABLE ABDOMEN - 1 VIEW

[abdomen kub]
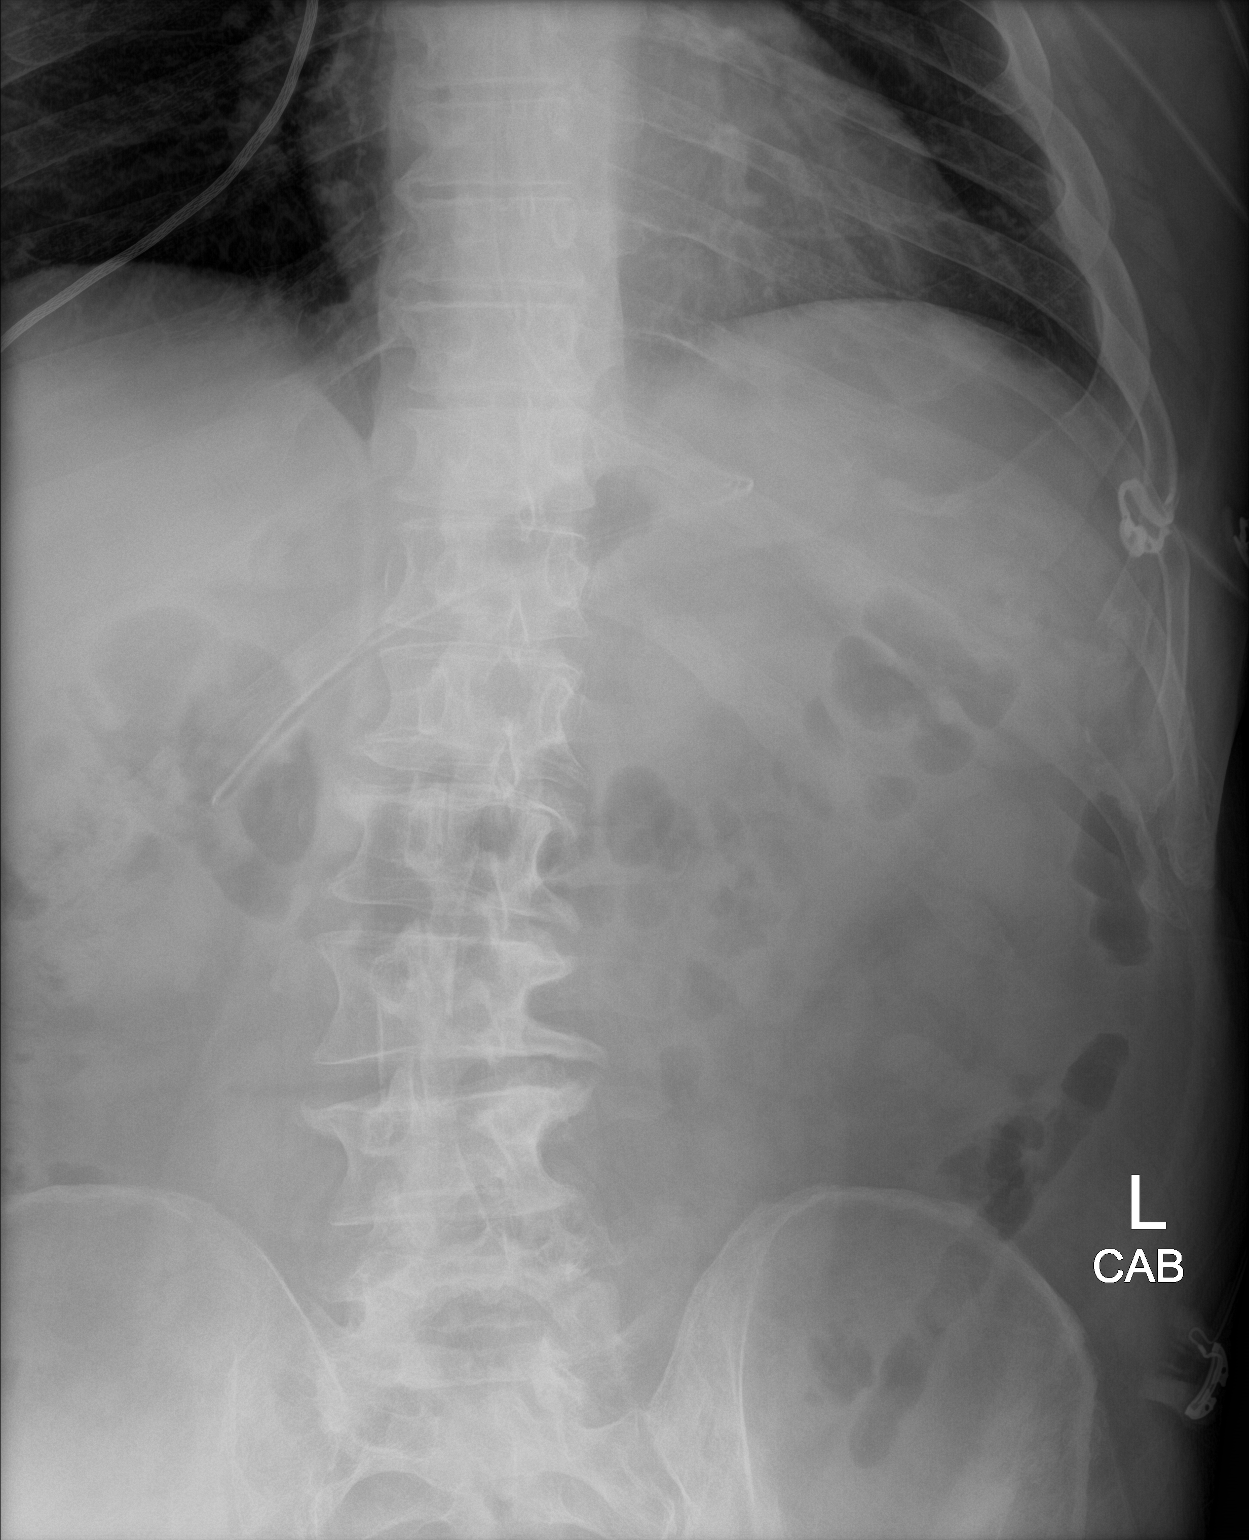

[1 of 1 positions shown; findings below may reference images not displayed]

FINDINGS: Tip of orogastric tube projects over expected position of duodenal
bulb.

Normal bowel gas pattern.

Lung bases clear.

Degenerative disc disease changes thoracolumbar spine with
dextroconvex lumbar scoliosis.
IMPRESSION: Tip of orogastric tube projects over expected position of duodenal
bulb.

## 2018-04-09 ENCOUNTER — Other Ambulatory Visit: Payer: Self-pay

## 2018-04-23 ENCOUNTER — Ambulatory Visit: Payer: Medicaid Other

## 2018-04-23 DIAGNOSIS — R200 Anesthesia of skin: Secondary | ICD-10-CM

## 2018-04-23 DIAGNOSIS — M79604 Pain in right leg: Secondary | ICD-10-CM

## 2018-04-23 DIAGNOSIS — R202 Paresthesia of skin: Secondary | ICD-10-CM

## 2018-04-23 DIAGNOSIS — M79605 Pain in left leg: Principal | ICD-10-CM

## 2018-05-20 ENCOUNTER — Ambulatory Visit: Payer: Self-pay | Admitting: Adult Health

## 2018-06-07 ENCOUNTER — Encounter: Payer: Self-pay | Admitting: Adult Health

## 2018-06-16 ENCOUNTER — Ambulatory Visit: Payer: Medicaid Other | Admitting: Adult Health

## 2018-06-16 ENCOUNTER — Encounter: Payer: Self-pay | Admitting: Adult Health

## 2018-06-16 VITALS — BP 118/68 | HR 73 | Resp 16 | Ht 72.0 in | Wt 211.0 lb

## 2018-06-16 DIAGNOSIS — Z0001 Encounter for general adult medical examination with abnormal findings: Secondary | ICD-10-CM

## 2018-06-16 DIAGNOSIS — D429 Neoplasm of uncertain behavior of meninges, unspecified: Secondary | ICD-10-CM

## 2018-06-16 DIAGNOSIS — M546 Pain in thoracic spine: Secondary | ICD-10-CM

## 2018-06-16 DIAGNOSIS — M79604 Pain in right leg: Secondary | ICD-10-CM

## 2018-06-16 DIAGNOSIS — E039 Hypothyroidism, unspecified: Secondary | ICD-10-CM

## 2018-06-16 DIAGNOSIS — R300 Dysuria: Secondary | ICD-10-CM

## 2018-06-16 DIAGNOSIS — M79605 Pain in left leg: Secondary | ICD-10-CM

## 2018-06-16 DIAGNOSIS — I10 Essential (primary) hypertension: Secondary | ICD-10-CM

## 2018-06-16 DIAGNOSIS — E782 Mixed hyperlipidemia: Secondary | ICD-10-CM

## 2018-06-16 DIAGNOSIS — F1029 Alcohol dependence with unspecified alcohol-induced disorder: Secondary | ICD-10-CM | POA: Diagnosis not present

## 2018-06-16 DIAGNOSIS — N529 Male erectile dysfunction, unspecified: Secondary | ICD-10-CM

## 2018-06-16 DIAGNOSIS — G629 Polyneuropathy, unspecified: Secondary | ICD-10-CM

## 2018-06-16 MED ORDER — GABAPENTIN 100 MG PO CAPS: 100.0000 mg | ORAL_CAPSULE | Freq: Three times a day (TID) | ORAL | 3 refills | Status: DC

## 2018-06-16 MED ORDER — LEVOTHYROXINE SODIUM 50 MCG PO TABS: 50.0000 ug | ORAL_TABLET | Freq: Every day | ORAL | 3 refills | Status: DC

## 2018-06-16 MED ORDER — LISINOPRIL 40 MG PO TABS: 40.0000 mg | ORAL_TABLET | Freq: Every day | ORAL | 3 refills | Status: DC

## 2018-06-16 MED ORDER — CYCLOBENZAPRINE HCL 10 MG PO TABS: 10.0000 mg | ORAL_TABLET | Freq: Two times a day (BID) | ORAL | 0 refills | Status: DC | PRN

## 2018-06-16 NOTE — Progress Notes (Signed)
[facility] [place], [place] [address]  Internal MEDICINE  Office Visit Note  Patient Name: Frederick Drake  [id number]  [id number]  Date of Service: 06/16/2018  Chief Complaint  Patient presents with  . Annual Exam  . Hypertension  . Back Pain    several weeks   . Neck Pain  . Quality Metric Gaps    colonoscopy     HPI Pt is here for routine health maintenance examination.  Patient is a well appearing 57 year old Caucasian male.  He presents today for his annual exam.  He is currently being treated for hypertension, and hypothyroid.  He is complaining today of back pain for several weeks, and also neck pain.  His neck pain is more between his shoulder blades at the base of his neck at approximately T2.  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  Patient has just returned to our practice after not seeing a medical provider for at least one year, in the past and noted that the patient had a meningioma, hyperlipidemia, neuropathy, as well has tobacco and alcohol dependence.  Patient reports he drinks approximately 12 beers every night.  He is also smoking at least 1 pack of cigarettes a day many days it is 2 packs/day.  He is also requesting to try Viagra for erectile dysfunction at this time.  Current Medication: Outpatient Encounter Medications as of 06/16/2018  Medication Sig  . lisinopril (PRINIVIL,ZESTRIL) 40 MG tablet Take 1 tablet (40 mg total) by mouth daily.  . [DISCONTINUED] finasteride (PROSCAR) 5 MG tablet Take 5 mg by mouth daily.  . [DISCONTINUED] lisinopril (PRINIVIL,ZESTRIL) 40 MG tablet Take 1 tablet (40 mg total) by mouth daily.  . cyclobenzaprine (FLEXERIL) 10 MG tablet Take 1 tablet (10 mg total) by mouth 2 (two) times daily as needed for muscle spasms.  Marland Kitchen gabapentin (NEURONTIN) 100 MG capsule Take 1 capsule (100 mg total) by mouth 3  (three) times daily.  Marland Kitchen levothyroxine (SYNTHROID, LEVOTHROID) 50 MCG tablet Take 1 tablet (50 mcg total) by mouth daily.  . meloxicam (MOBIC) 7.5 MG tablet Take 1 tablet (7.5 mg total) by mouth daily as needed for pain. (Patient not taking: Reported on 03/05/2018)  . nicotine (NICOTROL) 10 MG inhaler 6-16 catridges/day inhaled x 6-12 weeks, then taper dose over 6-12 week to discontinue smoking (Patient not taking: Reported on 03/05/2018)  . [DISCONTINUED] gabapentin (NEURONTIN) 100 MG capsule Take 1 capsule (100 mg total) by mouth 3 (three) times daily. (Patient not taking: Reported on 06/16/2018)  . [DISCONTINUED] levothyroxine (SYNTHROID, LEVOTHROID) 50 MCG tablet Take 1 tablet (50 mcg total) by mouth daily. (Patient not taking: Reported on 06/16/2018)  . [DISCONTINUED] tamsulosin (FLOMAX) 0.4 MG CAPS capsule Take 0.4 mg by mouth.   No facility-administered encounter medications on file as of 06/16/2018.     Surgical History: Past Surgical History:  Procedure Laterality Date  . CRANIOTOMY Left 11/06/2016   Procedure: LEFT Temporal CRANIOTOMY FOR TUMOR;  Surgeon: Ashok Pall, MD;  Location: [facility];  Service: Neurosurgery;  Laterality: Left;  LEFT Temproal CRANIOTOMY FOR TUMOR   . HERNIA REPAIR    . VASECTOMY      Medical History: Past Medical History:  Diagnosis Date  . Allergy    Seasonal  . Brain tumor (benign) ([place])   . Carpal tunnel syndrome   . Headache   . Hypertension   .  Neuropathy    Both legs  . Seizures ([place])     Family History: Family History  Problem Relation Age of Onset  . Cancer Sister        Breast      Review of Systems  Constitutional: Negative.  Negative for chills, fatigue and unexpected weight change.  HENT: Negative.  Negative for congestion, rhinorrhea, sneezing and sore throat.   Eyes: Negative for redness.  Respiratory: Negative.  Negative for cough, chest tightness and shortness of breath.   Cardiovascular: Negative.  Negative for chest pain  and palpitations.  Gastrointestinal: Negative.  Negative for abdominal pain, constipation, diarrhea, nausea and vomiting.  Endocrine: Negative.   Genitourinary: Negative.  Negative for dysuria and frequency.  Musculoskeletal: Negative.  Negative for arthralgias, back pain, joint swelling and neck pain.  Skin: Negative.  Negative for rash.  Allergic/Immunologic: Negative.   Neurological: Negative.  Negative for tremors and numbness.  Hematological: Negative for adenopathy. Does not bruise/bleed easily.  Psychiatric/Behavioral: Negative.  Negative for behavioral problems, sleep disturbance and suicidal ideas. The patient is not nervous/anxious.      Vital Signs: BP 118/68 (BP Location: Left Arm, Patient Position: Sitting, Cuff Size: Normal)   Pulse 73   Resp 16   Ht 6' (1.829 m)   Wt 211 lb (95.7 kg)   SpO2 95%   BMI 28.62 kg/m    Physical Exam  Constitutional: He is oriented to person, place, and time. He appears well-developed and well-nourished. No distress.  HENT:  Head: Normocephalic and atraumatic.  Mouth/Throat: Oropharynx is clear and moist. No oropharyngeal exudate.  Eyes: Pupils are equal, round, and reactive to light. EOM are normal.  Neck: Normal range of motion. Neck supple. No JVD present. No tracheal deviation present. No thyromegaly present.  Cardiovascular: Normal rate, regular rhythm and normal heart sounds. Exam reveals no gallop and no friction rub.  No murmur heard. Pulmonary/Chest: Effort normal and breath sounds normal. No respiratory distress. He has no wheezes. He has no rales. He exhibits no tenderness.  Abdominal: Soft. There is no tenderness. There is no guarding.  Musculoskeletal: Normal range of motion.  Lymphadenopathy:    He has no cervical adenopathy.  Neurological: He is alert and oriented to person, place, and time. No cranial nerve deficit.  Skin: Skin is warm and dry. He is not diaphoretic.  Psychiatric: He has a normal mood and affect. His  behavior is normal. Judgment and thought content normal.  Nursing note and vitals reviewed.    LABS: No results found for this or any previous visit (from the past 2160 hour(s)).   Assessment/Plan: 1. Encounter for general adult medical examination with abnormal findings Pt is missing his colonoscopy in his quality metric gaps.  We had a lengthy discussion about the importance of colonoscopy for patients over 76 years old, especially for patients who smoke.  Reports he is not interested at this time, and we agreed to talk about it at his physical next year.  2. Mixed hyperlipidemia Patient is most recent lipid panel is within normal limits.  Patient should continue taking statin medication as directed.  3. Essential hypertension, benign Patient's blood pressure well controlled at this time.  Lisinopril prescription refilled, encourage patient to continue taking medications as well. - lisinopril (PRINIVIL,ZESTRIL) 40 MG tablet; Take 1 tablet (40 mg total) by mouth daily.  Dispense: 90 tablet; Refill: 3  4. Meningioma, multiple ([place]) Patient is doing really working on seeing neurosurgery to follow-up on his meningioma.  He brought port to office today to fill out for transportation, apparently the neurosurgery office is awaiting that approval so that they can schedule appointment.  5. Neuropathy Patient has some intermittent neuropathy in his bilateral lower extremities.  In the past he has taken multiple medications for this.  He is currently taking Neurontin, with good results.  6. Acute midline thoracic back pain Patient has thoracic back pain, he reports this pain is worse at night. - DG Thoracic Spine 2 View; Future - cyclobenzaprine (FLEXERIL) 10 MG tablet; Take 1 tablet (10 mg total) by mouth 2 (two) times daily as needed for muscle spasms.  Dispense: 30 tablet; Refill: 0  7. Hypothyroidism, unspecified type Patient has not been taking Synthroid, he is unsure when he stopped  taking it.  He reports the medication ran out and he never got it refilled.  We discussed the importance of compliance for hypothyroidism.  Refilled his Synthroid at this time. - levothyroxine (SYNTHROID, LEVOTHROID) 50 MCG tablet; Take 1 tablet (50 mcg total) by mouth daily.  Dispense: 90 tablet; Refill: 3  8. Pain in both lower extremities Bilateral lower extremity pain, most likely from neuropathy.  Gabapentin prescription refilled. - gabapentin (NEURONTIN) 100 MG capsule; Take 1 capsule (100 mg total) by mouth 3 (three) times daily.  Dispense: 90 capsule; Refill: 3  9. Alcohol dependence with unspecified alcohol-induced disorder ([place])   10. Erectile dysfunction, unspecified erectile dysfunction type Patient reports some erectile dysfunction.  He would like to try some Viagra.  We discussed microvascular disease changes associated with his smoking, alcohol abuse, as well as uncontrolled blood sugars. Prescription for sildenafil 20 mg, take 1 tab p.o. as needed, #10, no refills.   Patient will try sildenafil and will discuss results at next appointment.  11. Dysuria - UA/M w/rflx Culture, Routine  General Counseling: Stormy verbalizes understanding of the findings of todays visit and agrees with plan of treatment. I have discussed any further diagnostic evaluation that may be needed or ordered today. We also reviewed his medications today. he has been encouraged to call the office with any questions or concerns that should arise related to todays visit.   Orders Placed This Encounter  Procedures  . DG Thoracic Spine 2 View  . UA/M w/rflx Culture, Routine    Meds ordered this encounter  Medications  . levothyroxine (SYNTHROID, LEVOTHROID) 50 MCG tablet    Sig: Take 1 tablet (50 mcg total) by mouth daily.    Dispense:  90 tablet    Refill:  3  . cyclobenzaprine (FLEXERIL) 10 MG tablet    Sig: Take 1 tablet (10 mg total) by mouth 2 (two) times daily as needed for muscle spasms.     Dispense:  30 tablet    Refill:  0  . lisinopril (PRINIVIL,ZESTRIL) 40 MG tablet    Sig: Take 1 tablet (40 mg total) by mouth daily.    Dispense:  90 tablet    Refill:  3  . gabapentin (NEURONTIN) 100 MG capsule    Sig: Take 1 capsule (100 mg total) by mouth 3 (three) times daily.    Dispense:  90 capsule    Refill:  3    Time spent: 30 Minutes   This patient was seen by Orson Gear AGNP-C in Collaboration with Dr Lavera Guise as a part of collaborative care agreement    Kendell Bane AGNP-C Internal Medicine

## 2018-06-16 NOTE — Patient Instructions (Signed)

## 2018-06-17 LAB — UA/M W/RFLX CULTURE, ROUTINE
Bilirubin, UA: NEGATIVE
GLUCOSE, UA: NEGATIVE
KETONES UA: NEGATIVE
LEUKOCYTES UA: NEGATIVE
NITRITE UA: NEGATIVE
PROTEIN UA: NEGATIVE
RBC, UA: NEGATIVE
SPEC GRAV UA: 1.005 (ref 1.005–1.030)
UUROB: 0.2 mg/dL (ref 0.2–1.0)
pH, UA: 5.5 (ref 5.0–7.5)

## 2018-06-17 LAB — MICROSCOPIC EXAMINATION
Bacteria, UA: NONE SEEN
Casts: NONE SEEN /lpf
EPITHELIAL CELLS (NON RENAL): NONE SEEN /HPF (ref 0–10)
RBC MICROSCOPIC, UA: NONE SEEN /HPF (ref 0–2)

## 2018-07-19 ENCOUNTER — Other Ambulatory Visit: Payer: Self-pay | Admitting: Adult Health

## 2018-07-19 DIAGNOSIS — M546 Pain in thoracic spine: Secondary | ICD-10-CM

## 2018-07-22 ENCOUNTER — Ambulatory Visit: Payer: Self-pay | Admitting: Ophthalmology

## 2018-07-29 ENCOUNTER — Ambulatory Visit: Payer: Self-pay | Admitting: Ophthalmology

## 2018-08-20 ENCOUNTER — Other Ambulatory Visit: Payer: Self-pay | Admitting: Adult Health

## 2018-08-20 DIAGNOSIS — M546 Pain in thoracic spine: Secondary | ICD-10-CM

## 2018-09-20 ENCOUNTER — Ambulatory Visit: Payer: Self-pay | Admitting: Adult Health

## 2018-10-05 ENCOUNTER — Other Ambulatory Visit: Payer: Self-pay | Admitting: Adult Health

## 2018-10-05 DIAGNOSIS — M546 Pain in thoracic spine: Secondary | ICD-10-CM

## 2018-10-20 ENCOUNTER — Other Ambulatory Visit: Payer: Self-pay | Admitting: Adult Health

## 2018-10-20 DIAGNOSIS — M79604 Pain in right leg: Secondary | ICD-10-CM

## 2018-10-20 DIAGNOSIS — M79605 Pain in left leg: Principal | ICD-10-CM

## 2018-12-31 ENCOUNTER — Other Ambulatory Visit: Payer: Self-pay | Admitting: Adult Health

## 2018-12-31 DIAGNOSIS — M546 Pain in thoracic spine: Secondary | ICD-10-CM

## 2019-02-17 ENCOUNTER — Other Ambulatory Visit: Payer: Self-pay | Admitting: Adult Health

## 2019-02-17 ENCOUNTER — Other Ambulatory Visit: Payer: Self-pay

## 2019-02-17 DIAGNOSIS — M546 Pain in thoracic spine: Secondary | ICD-10-CM

## 2019-02-17 MED ORDER — CYCLOBENZAPRINE HCL 10 MG PO TABS: ORAL_TABLET | ORAL | 1 refills | Status: DC

## 2019-05-09 ENCOUNTER — Other Ambulatory Visit: Payer: Self-pay

## 2019-06-04 ENCOUNTER — Other Ambulatory Visit: Payer: Self-pay | Admitting: Adult Health

## 2019-06-04 DIAGNOSIS — M79604 Pain in right leg: Secondary | ICD-10-CM

## 2019-06-04 DIAGNOSIS — M546 Pain in thoracic spine: Secondary | ICD-10-CM

## 2019-06-04 DIAGNOSIS — M79605 Pain in left leg: Secondary | ICD-10-CM

## 2019-06-21 ENCOUNTER — Other Ambulatory Visit: Payer: Self-pay | Admitting: Adult Health

## 2019-06-21 ENCOUNTER — Encounter: Payer: Self-pay | Admitting: Adult Health

## 2019-06-21 DIAGNOSIS — E039 Hypothyroidism, unspecified: Secondary | ICD-10-CM

## 2019-06-21 DIAGNOSIS — I10 Essential (primary) hypertension: Secondary | ICD-10-CM

## 2019-06-21 NOTE — Telephone Encounter (Signed)
Pt need appt for further refills.

## 2020-01-12 ENCOUNTER — Other Ambulatory Visit: Payer: Self-pay

## 2020-01-12 ENCOUNTER — Emergency Department: Payer: Medicare Other

## 2020-01-12 ENCOUNTER — Encounter: Payer: Self-pay | Admitting: Emergency Medicine

## 2020-01-12 ENCOUNTER — Emergency Department
Admission: EM | Admit: 2020-01-12 | Discharge: 2020-01-13 | Disposition: A | Discharge status: Other Acute Inpatient Hospital | Payer: Medicare Other | Attending: Student in an Organized Health Care Education/Training Program | Admitting: Student in an Organized Health Care Education/Training Program

## 2020-01-12 DIAGNOSIS — R900 Intracranial space-occupying lesion found on diagnostic imaging of central nervous system: Secondary | ICD-10-CM

## 2020-01-12 DIAGNOSIS — Z20822 Contact with and (suspected) exposure to covid-19: Secondary | ICD-10-CM | POA: Insufficient documentation

## 2020-01-12 DIAGNOSIS — I10 Essential (primary) hypertension: Secondary | ICD-10-CM | POA: Diagnosis not present

## 2020-01-12 DIAGNOSIS — Z79899 Other long term (current) drug therapy: Secondary | ICD-10-CM | POA: Diagnosis not present

## 2020-01-12 DIAGNOSIS — F17210 Nicotine dependence, cigarettes, uncomplicated: Secondary | ICD-10-CM | POA: Insufficient documentation

## 2020-01-12 DIAGNOSIS — R569 Unspecified convulsions: Secondary | ICD-10-CM | POA: Diagnosis not present

## 2020-01-12 DIAGNOSIS — D332 Benign neoplasm of brain, unspecified: Secondary | ICD-10-CM | POA: Diagnosis not present

## 2020-01-12 LAB — BLOOD GAS, ARTERIAL
Acid-base deficit: 0.7 mmol/L (ref 0.0–2.0)
Bicarbonate: 24.2 mmol/L (ref 20.0–28.0)
FIO2: 0.7
MECHVT: 500 mL
O2 Saturation: 99.8 %
PEEP: 5 cmH2O
Patient temperature: 37
RATE: 16 resp/min
pCO2 arterial: 40 mmHg (ref 32.0–48.0)
pH, Arterial: 7.39 (ref 7.350–7.450)
pO2, Arterial: 236 mmHg — ABNORMAL HIGH (ref 83.0–108.0)

## 2020-01-12 LAB — CBC WITH DIFFERENTIAL/PLATELET
Abs Immature Granulocytes: 0.04 10*3/uL (ref 0.00–0.07)
Basophils Absolute: 0.1 10*3/uL (ref 0.0–0.1)
Basophils Relative: 1 %
Eosinophils Absolute: 0.1 10*3/uL (ref 0.0–0.5)
Eosinophils Relative: 1 %
HCT: 49.1 % (ref 39.0–52.0)
Hemoglobin: 16.4 g/dL (ref 13.0–17.0)
Immature Granulocytes: 0 %
Lymphocytes Relative: 16 %
Lymphs Abs: 1.5 10*3/uL (ref 0.7–4.0)
MCH: 34.6 pg — ABNORMAL HIGH (ref 26.0–34.0)
MCHC: 33.4 g/dL (ref 30.0–36.0)
MCV: 103.6 fL — ABNORMAL HIGH (ref 80.0–100.0)
Monocytes Absolute: 0.5 10*3/uL (ref 0.1–1.0)
Monocytes Relative: 6 %
Neutro Abs: 7.2 10*3/uL (ref 1.7–7.7)
Neutrophils Relative %: 76 %
Platelets: 161 10*3/uL (ref 150–400)
RBC: 4.74 MIL/uL (ref 4.22–5.81)
RDW: 12.8 % (ref 11.5–15.5)
WBC: 9.5 10*3/uL (ref 4.0–10.5)
nRBC: 0 % (ref 0.0–0.2)

## 2020-01-12 LAB — BLOOD GAS, VENOUS
Acid-Base Excess: 2.4 mmol/L — ABNORMAL HIGH (ref 0.0–2.0)
Acid-base deficit: 14.1 mmol/L — ABNORMAL HIGH (ref 0.0–2.0)
Bicarbonate: 14.6 mmol/L — ABNORMAL LOW (ref 20.0–28.0)
Bicarbonate: 27.9 mmol/L (ref 20.0–28.0)
O2 Saturation: 59.8 %
O2 Saturation: 67.3 %
Patient temperature: 37
Patient temperature: 37
pCO2, Ven: 43 mmHg — ABNORMAL LOW (ref 44.0–60.0)
pCO2, Ven: 45 mmHg (ref 44.0–60.0)
pH, Ven: 7.14 — CL (ref 7.250–7.430)
pH, Ven: 7.4 (ref 7.250–7.430)
pO2, Ven: 42 mmHg (ref 32.0–45.0)
pO2, Ven: 35 mmHg (ref 32.0–45.0)

## 2020-01-12 LAB — COMPREHENSIVE METABOLIC PANEL
ALT: 23 U/L (ref 0–44)
AST: 48 U/L — ABNORMAL HIGH (ref 15–41)
Albumin: 4.2 g/dL (ref 3.5–5.0)
Alkaline Phosphatase: 70 U/L (ref 38–126)
Anion gap: 24 — ABNORMAL HIGH (ref 5–15)
BUN: 8 mg/dL (ref 6–20)
CO2: 13 mmol/L — ABNORMAL LOW (ref 22–32)
Calcium: 9.1 mg/dL (ref 8.9–10.3)
Chloride: 101 mmol/L (ref 98–111)
Creatinine, Ser: 1.08 mg/dL (ref 0.61–1.24)
GFR calc Af Amer: >60 mL/min (ref >60)
GFR calc non Af Amer: >60 mL/min (ref >60)
Glucose, Bld: 127 mg/dL — ABNORMAL HIGH (ref 70–99)
Potassium: 3.6 mmol/L (ref 3.5–5.1)
Sodium: 138 mmol/L (ref 135–145)
Total Bilirubin: 0.6 mg/dL (ref 0.3–1.2)
Total Protein: 8.4 g/dL — ABNORMAL HIGH (ref 6.5–8.1)

## 2020-01-12 LAB — URINE DRUG SCREEN, QUALITATIVE (ARMC ONLY)
Amphetamines, Ur Screen: NOT DETECTED
Barbiturates, Ur Screen: NOT DETECTED
Benzodiazepine, Ur Scrn: POSITIVE — AB
Cannabinoid 50 Ng, Ur ~~LOC~~: POSITIVE — AB
Cocaine Metabolite,Ur ~~LOC~~: NOT DETECTED
MDMA (Ecstasy)Ur Screen: NOT DETECTED
Methadone Scn, Ur: NOT DETECTED
Opiate, Ur Screen: NOT DETECTED
Phencyclidine (PCP) Ur S: NOT DETECTED
Tricyclic, Ur Screen: NOT DETECTED

## 2020-01-12 LAB — SARS CORONAVIRUS 2 BY RT PCR (HOSPITAL ORDER, PERFORMED IN ~~LOC~~ HOSPITAL LAB): SARS Coronavirus 2: NEGATIVE

## 2020-01-12 LAB — ETHANOL: Alcohol, Ethyl (B): <10 mg/dL (ref <10)

## 2020-01-12 LAB — GLUCOSE, CAPILLARY: Glucose-Capillary: 127 mg/dL — ABNORMAL HIGH (ref 70–99)

## 2020-01-12 MED ORDER — LEVETIRACETAM IN NACL 1500 MG/100ML IV SOLN
1500.0000 mg | Freq: Once | INTRAVENOUS | Status: AC
  Administered 2020-01-12: 1500 mg via INTRAVENOUS
  Filled 2020-01-12: qty 100

## 2020-01-12 MED ORDER — ETOMIDATE 2 MG/ML IV SOLN
INTRAVENOUS | Status: AC | PRN
  Administered 2020-01-12: 30 mg via INTRAVENOUS

## 2020-01-12 MED ORDER — SUCCINYLCHOLINE CHLORIDE 20 MG/ML IJ SOLN
INTRAMUSCULAR | Status: AC | PRN
  Administered 2020-01-12: 130 mg via INTRAVENOUS

## 2020-01-12 MED ORDER — MIDAZOLAM HCL 2 MG/2ML IJ SOLN
INTRAMUSCULAR | Status: AC
  Filled 2020-01-12: qty 2

## 2020-01-12 MED ORDER — MIDAZOLAM HCL 2 MG/2ML IJ SOLN
2.0000 mg | Freq: Once | INTRAMUSCULAR | Status: AC
  Administered 2020-01-12: 2 mg via INTRAVENOUS

## 2020-01-12 MED ORDER — MIDAZOLAM HCL 2 MG/2ML IJ SOLN
2.0000 mg | Freq: Once | INTRAMUSCULAR | Status: AC
  Administered 2020-01-12: 2 mg via INTRAVENOUS
  Filled 2020-01-12: qty 2

## 2020-01-12 MED ORDER — MIDAZOLAM HCL 2 MG/2ML IJ SOLN
INTRAMUSCULAR | Status: AC | PRN
  Administered 2020-01-12 (×2): 2 mg via INTRAVENOUS

## 2020-01-12 MED ORDER — FENTANYL CITRATE (PF) 100 MCG/2ML IJ SOLN
100.0000 ug | INTRAMUSCULAR | Status: DC | PRN
  Administered 2020-01-12: 100 ug via INTRAVENOUS

## 2020-01-12 MED ORDER — PROPOFOL 1000 MG/100ML IV EMUL
INTRAVENOUS | Status: AC
  Administered 2020-01-12: 100 mg
  Filled 2020-01-12: qty 100

## 2020-01-12 MED ORDER — FENTANYL 2500MCG IN NS 250ML (10MCG/ML) PREMIX INFUSION
0.0000 ug/h | INTRAVENOUS | Status: DC
  Administered 2020-01-12: 25 ug/h via INTRAVENOUS
  Filled 2020-01-12: qty 250

## 2020-01-12 MED ORDER — DEXAMETHASONE SODIUM PHOSPHATE 10 MG/ML IJ SOLN
10.0000 mg | Freq: Once | INTRAMUSCULAR | Status: AC
  Administered 2020-01-12: 10 mg via INTRAVENOUS
  Filled 2020-01-12: qty 1

## 2020-01-12 MED ORDER — MIDAZOLAM HCL 5 MG/5ML IJ SOLN
INTRAMUSCULAR | Status: AC | PRN
  Administered 2020-01-12: 2 mg via INTRAVENOUS

## 2020-01-12 MED ORDER — LACTATED RINGERS IV BOLUS
1000.0000 mL | Freq: Once | INTRAVENOUS | Status: AC
  Administered 2020-01-12: 1000 mL via INTRAVENOUS

## 2020-01-12 NOTE — ED Triage Notes (Signed)
Pt to ED via [facility] from home for seizures. EMS reports pt was found in the bathroom. Wife poor historian per EMS. EMS reports pt possibly seizing x 2 hours. Pt was post ictal on EMS arrival. Pt had 1 witnessed seizure with EMS and was given 2 mg of intranasal Versed. Upon arrival to ED pt responsive to painful stimuli only with snoring respirations.   Pt has 20 G iv in the right forearm. EMS reports that had episodes of bradycardia.   CBG on arrival to ED 127

## 2020-01-12 NOTE — ED Notes (Signed)
Pt agitated and yelling at staff to let him up. Explained to pt that for his safety, this RN will assist with bedpan. Pt still confused and not acknowledging safety concerns. This RN called for assistance as pt became increasingly agitated. 2nd, RN, CN, ED tech and MD @ bedside.

## 2020-01-12 NOTE — ED Notes (Signed)
Pt resting in bed at this time. Continuous cardiac, BP, and O2 monitoring in place. Respirations appear even, unlabored, and symmetrical. No signs of distress noted at this time. 

## 2020-01-12 NOTE — ED Provider Notes (Signed)
[facility] Emergency Department Provider Note    First MD Initiated Contact with Patient 01/12/20 1849     (approximate)  I have reviewed the triage vital signs and the nursing notes.   HISTORY  Chief Complaint  Seizures  Level v Caveat:  AMS  HPI Frederick Drake is a 59 y.o. male the below listed past medical history presents to the ER for evaluation of multiple seizures today.  EMS the patient in emergency traffic due to multiple seizures requiring Versed with snoring respirations.  On arrival to the ER patient is postictal will intermittently open his eyes did not scratch his nose and then dozed back off.  He is snoring but maintaining his O2 saturations with nasal cannula.  Does have intact gag reflex.  CBG is 127.  EMS reports that wife is a very poor historian.  No further details as to what happened today.    Past Medical History:  Diagnosis Date  . Allergy    Seasonal  . Brain tumor (benign) ([place])   . Carpal tunnel syndrome   . Headache   . Hypertension   . Neuropathy    Both legs  . Seizures ([place])    Family History  Problem Relation Age of Onset  . Cancer Sister        Breast   Past Surgical History:  Procedure Laterality Date  . CRANIOTOMY Left 11/06/2016   Procedure: LEFT Temporal CRANIOTOMY FOR TUMOR;  Surgeon: Ashok Pall, MD;  Location: [facility];  Service: Neurosurgery;  Laterality: Left;  LEFT Temproal CRANIOTOMY FOR TUMOR   . HERNIA REPAIR    . VASECTOMY     Patient Active Problem List   Diagnosis Date Noted  . Leg pain 07/23/2017  . Shoulder pain, left 07/23/2017  . Brain tumor ([place]) 11/06/2016  . Brain mass   . Acute respiratory failure ([place])   . Seizures ([place])   . Aphasia   . Meningioma ([place])   . Acute encephalopathy   . Status epilepticus ([place]) 10/16/2016  . 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 Hypertension 06/17/2011  . Diabetes ([place]) 05/09/2010      Prior to Admission medications   Medication Sig Start  Date End Date Taking? Authorizing Provider  cyclobenzaprine (FLEXERIL) 10 MG tablet TAKE ONE TABLET BY MOUTH TWICE DAILY AS NEEDED FOR MUSCLE SPASMS 06/04/19   Lavera Guise, MD  gabapentin (NEURONTIN) 100 MG capsule TAKE ONE CAPSULE BY MOUTH THREE TIMES DAILY 06/04/19   Lavera Guise, MD  levothyroxine (SYNTHROID) 50 MCG tablet TAKE ONE TABLET BY MOUTH EVERY MORNING 06/21/19   Lavera Guise, MD  lisinopril (ZESTRIL) 40 MG tablet TAKE ONE TABLET BY MOUTH ONCE DAILY 06/21/19   Lavera Guise, MD  meloxicam (MOBIC) 7.5 MG tablet Take 1 tablet (7.5 mg total) by mouth daily as needed for pain. Patient not taking: Reported on 03/05/2018 07/23/17   Doles-Johnson, Teah, NP  nicotine (NICOTROL) 10 MG inhaler 6-16 catridges/day inhaled x 6-12 weeks, then taper dose over 6-12 week to discontinue smoking Patient not taking: Reported on 03/05/2018 07/02/17   Zara Council A, PA-C    Allergies Chantix [varenicline], Wasp venom, and Penicillins    Social History Social History   Tobacco Use  . Smoking status: Current Every Day Smoker    Packs/day: 2.00    Years: 40.00    Pack years: 80.00    Types: Cigarettes  . Smokeless tobacco: Never Used  Substance Use Topics  .  Alcohol use: Yes    Alcohol/week: 12.0 standard drinks    Types: 12 Cans of beer per week    Comment: 12 day  . Drug use: No    Review of Systems Patient denies headaches, rhinorrhea, blurry vision, numbness, shortness of breath, chest pain, edema, cough, abdominal pain, nausea, vomiting, diarrhea, dysuria, fevers, rashes or hallucinations unless otherwise stated above in HPI. ____________________________________________   PHYSICAL EXAM:  VITAL SIGNS: Vitals:   01/12/20 2200 01/12/20 2230  BP: 133/64 (!) 145/85  Pulse:  78  Resp:    Temp:    SpO2:  90%    Constitutional: drowsy, postictal Eyes: Conjunctivae are normal.  Head: Atraumatic. Nose: No congestion/rhinnorhea. Mouth/Throat: Mucous membranes are moist.     Neck: No stridor. Painless ROM.  Cardiovascular: Normal rate, regular rhythm. Grossly normal heart sounds.  Good peripheral circulation. Respiratory: Normal respiratory effort.  No retractions. Lungs with scattered wheeze Gastrointestinal: Soft and nontender. No distention. No abdominal bruits. No CVA tenderness. Genitourinary:  Musculoskeletal: No lower extremity tenderness nor edema.  No joint effusions. Neurologic:  Postictal, MAE spontaneously, no gaze deviation, unable to follow commands Skin:  Skin is warm, dry and intact. No rash noted. Psychiatric: unable to assess  ____________________________________________   LABS (all labs ordered are listed, but only abnormal results are displayed)  Results for orders placed or performed during the hospital encounter of 01/12/20 (from the past 24 hour(s))  Glucose, capillary     Status: Abnormal   Collection Time: 01/12/20  7:01 PM  Result Value Ref Range   Glucose-Capillary 127 (H) 70 - 99 mg/dL  CBC with Differential/Platelet     Status: Abnormal   Collection Time: 01/12/20  7:02 PM  Result Value Ref Range   WBC 9.5 4.0 - 10.5 K/uL   RBC 4.74 4.22 - 5.81 MIL/uL   Hemoglobin 16.4 13.0 - 17.0 g/dL   HCT 49.1 39.0 - 52.0 %   MCV 103.6 (H) 80.0 - 100.0 fL   MCH 34.6 (H) 26.0 - 34.0 pg   MCHC 33.4 30.0 - 36.0 g/dL   RDW 12.8 11.5 - 15.5 %   Platelets 161 150 - 400 K/uL   nRBC 0.0 0.0 - 0.2 %   Neutrophils Relative % 76 %   Neutro Abs 7.2 1.7 - 7.7 K/uL   Lymphocytes Relative 16 %   Lymphs Abs 1.5 0.7 - 4.0 K/uL   Monocytes Relative 6 %   Monocytes Absolute 0.5 0.1 - 1.0 K/uL   Eosinophils Relative 1 %   Eosinophils Absolute 0.1 0.0 - 0.5 K/uL   Basophils Relative 1 %   Basophils Absolute 0.1 0.0 - 0.1 K/uL   Immature Granulocytes 0 %   Abs Immature Granulocytes 0.04 0.00 - 0.07 K/uL  Comprehensive metabolic panel     Status: Abnormal   Collection Time: 01/12/20  7:02 PM  Result Value Ref Range   Sodium 138 135 - 145 mmol/L    Potassium 3.6 3.5 - 5.1 mmol/L   Chloride 101 98 - 111 mmol/L   CO2 13 (L) 22 - 32 mmol/L   Glucose, Bld 127 (H) 70 - 99 mg/dL   BUN 8 6 - 20 mg/dL   Creatinine, Ser 1.08 0.61 - 1.24 mg/dL   Calcium 9.1 8.9 - 10.3 mg/dL   Total Protein 8.4 (H) 6.5 - 8.1 g/dL   Albumin 4.2 3.5 - 5.0 g/dL   AST 48 (H) 15 - 41 U/L   ALT 23 0 - 44 U/L  Alkaline Phosphatase 70 38 - 126 U/L   Total Bilirubin 0.6 0.3 - 1.2 mg/dL   GFR calc non Af Amer >60 >60 mL/min   GFR calc Af Amer >60 >60 mL/min   Anion gap 24 (H) 5 - 15  Ethanol     Status: None   Collection Time: 01/12/20  7:02 PM  Result Value Ref Range   Alcohol, Ethyl (B) <10 <10 mg/dL  Blood gas, venous     Status: Abnormal   Collection Time: 01/12/20  7:05 PM  Result Value Ref Range   pH, Ven 7.14 (LL) 7.250 - 7.430   pCO2, Ven 43 (L) 44.0 - 60.0 mmHg   pO2, Ven 42.0 32.0 - 45.0 mmHg   Bicarbonate 14.6 (L) 20.0 - 28.0 mmol/L   Acid-base deficit 14.1 (H) 0.0 - 2.0 mmol/L   O2 Saturation 59.8 %   Patient temperature 37.0    Collection site LINE    Sample type VENOUS   Urine Drug Screen, Qualitative ([facility] only)     Status: Abnormal   Collection Time: 01/12/20  7:05 PM  Result Value Ref Range   Tricyclic, Ur Screen NONE DETECTED NONE DETECTED   Amphetamines, Ur Screen NONE DETECTED NONE DETECTED   MDMA (Ecstasy)Ur Screen NONE DETECTED NONE DETECTED   Cocaine Metabolite,Ur [place] NONE DETECTED NONE DETECTED   Opiate, Ur Screen NONE DETECTED NONE DETECTED   Phencyclidine (PCP) Ur S NONE DETECTED NONE DETECTED   Cannabinoid 50 Ng, Ur [place] POSITIVE (A) NONE DETECTED   Barbiturates, Ur Screen NONE DETECTED NONE DETECTED   Benzodiazepine, Ur Scrn POSITIVE (A) NONE DETECTED   Methadone Scn, Ur NONE DETECTED NONE DETECTED  SARS Coronavirus 2 by RT PCR (hospital order, performed in [facility] hospital lab) Nasopharyngeal Nasopharyngeal Swab     Status: None   Collection Time: 01/12/20  8:29 PM   Specimen: Nasopharyngeal Swab  Result Value Ref  Range   SARS Coronavirus 2 NEGATIVE NEGATIVE  Blood gas, venous     Status: Abnormal   Collection Time: 01/12/20  9:50 PM  Result Value Ref Range   pH, Ven 7.40 7.250 - 7.430   pCO2, Ven 45 44.0 - 60.0 mmHg   pO2, Ven 35.0 32.0 - 45.0 mmHg   Bicarbonate 27.9 20.0 - 28.0 mmol/L   Acid-Base Excess 2.4 (H) 0.0 - 2.0 mmol/L   O2 Saturation 67.3 %   Patient temperature 37.0    Collection site LINE    Sample type VENOUS   Blood gas, arterial     Status: Abnormal   Collection Time: 01/12/20 11:05 PM  Result Value Ref Range   FIO2 0.70    Delivery systems VENTILATOR    Mode ASSIST CONTROL    VT 500 mL   LHR 16 resp/min   Peep/cpap 5.0 cm H20   pH, Arterial 7.39 7.350 - 7.450   pCO2 arterial 40 32.0 - 48.0 mmHg   pO2, Arterial 236 (H) 83.0 - 108.0 mmHg   Bicarbonate 24.2 20.0 - 28.0 mmol/L   Acid-base deficit 0.7 0.0 - 2.0 mmol/L   O2 Saturation 99.8 %   Patient temperature 37.0    Collection site RIGHT RADIAL    Sample type ARTERIAL DRAW    Allens test (pass/fail) PASS PASS   ____________________________________________  EKG My review and personal interpretation at Time: 18:57   Indication: seizure  Rate: 95  Rhythm: sinus Axis: normal Other: nonspecific st abn, no stemi ____________________________________________  RADIOLOGY  I personally reviewed all radiographic images  ordered to evaluate for the above acute complaints and reviewed radiology reports and findings.  These findings were personally discussed with the patient.  Please see medical record for radiology report.  ____________________________________________   PROCEDURES  Procedure(s) performed:  .Critical Care Performed by: Merlyn Lot, MD Authorized by: Merlyn Lot, MD   Critical care provider statement:    Critical care time (minutes):  40   Critical care time was exclusive of:  Separately billable procedures and treating other patients   Critical care was necessary to treat or prevent  imminent or life-threatening deterioration of the following conditions:  CNS failure or compromise   Critical care was time spent personally by me on the following activities:  Development of treatment plan with patient or surrogate, discussions with consultants, evaluation of patient's response to treatment, examination of patient, obtaining history from patient or surrogate, ordering and performing treatments and interventions, ordering and review of laboratory studies, ordering and review of radiographic studies, pulse oximetry, re-evaluation of patient's condition and review of old charts Procedure Name: Intubation Date/Time: 01/12/2020 11:16 PM Performed by: Merlyn Lot, MD Pre-anesthesia Checklist: Patient identified, Patient being monitored, Emergency Drugs available, Timeout performed and Suction available Oxygen Delivery Method: Non-rebreather mask Preoxygenation: Pre-oxygenation with 100% oxygen Induction Type: Rapid sequence Ventilation: Mask ventilation without difficulty Laryngoscope Size: Glidescope and 3 Grade View: Grade II Tube size: 7.5 mm Number of attempts: 1 Airway Equipment and Method: Video-laryngoscopy Placement Confirmation: ETT inserted through vocal cords under direct vision,  CO2 detector and Breath sounds checked- equal and bilateral Secured at: 23 cm Tube secured with: ETT holder         Critical Care performed: yes ____________________________________________   INITIAL IMPRESSION / ASSESSMENT AND PLAN / ED COURSE  Pertinent labs & imaging results that were available during my care of the patient were reviewed by me and considered in my medical decision making (see chart for details).   DDX: seizure, electrolyete abn, mass, cva, ich, noncompliance, withdrawal  Frederick Drake is a 59 y.o. who presents to the ED with symptoms as described above.  Patient arrives to the ER postictal.  Is requiring supplemental oxygen but seems to be protecting his  airway.  I do not see evidence of persistent seizure.  Will observe.  The patient will be placed on continuous pulse oximetry and telemetry for monitoring.  Laboratory evaluation will be sent to evaluate for the above complaints.     Clinical Course as of Jan 12 2323  Thu Jan 12, 2020  1922 Patient now comes back around.  More alert but is starting become agitated unwillingness to sit in bed still appears postictal but not having any evidence of persistent seizures.  Will give additional dose of Versed for calming agent additional antiseizure treatment.  We will continue to observe.   [PR]  2046 Patient now following commands.  Discussed results of CT imaging my concern for recurrence of meningioma.  Not showing any signs of status epilepticus.  No focal neuro deficits at this time.  I discussed the case in consultation with Dr. Cari Caraway of neurosurgery.  We are in somewhat of an abnormal state as nearly every referral center is at capacity and on diversion.  As the patient is not currently showing signs of status epilepticus plan will be to admit to this facility for antiepileptic loading seizure observation MRI and neurosurgery consultation in the morning.   [PR]  2252 Patient's repeat gas without any worsening hypercapnia but patient started to become more  somnolent with snoring respirations becoming hypoxic on nasal cannula.  Not returned  baseline concern for altered mental status and airway protection was determined the patient's best interest to proceed with intubation for airway protection.  Patient will be transferred to [facility] neuro ICU for further work-up management.   [PR]  2321 Patient intubated without complication.    [PR]    Clinical Course User Index [PR] Merlyn Lot, MD    The patient was evaluated in Emergency Department today for the symptoms described in the history of present illness. He/she was evaluated in the context of the global COVID-19 pandemic, which necessitated  consideration that the patient might be at risk for infection with the SARS-CoV-2 virus that causes COVID-19. Institutional protocols and algorithms that pertain to the evaluation of patients at risk for COVID-19 are in a state of rapid change based on information released by regulatory bodies including the CDC and federal and state organizations. These policies and algorithms were followed during the patient's care in the ED.  As part of my medical decision making, I reviewed the following data within the [place] notes reviewed and incorporated, Labs reviewed, notes from prior ED visits and [place] Controlled Substance Database   ____________________________________________   FINAL CLINICAL IMPRESSION(S) / ED DIAGNOSES  Final diagnoses:  Seizure ([place])  Intracranial mass      NEW MEDICATIONS STARTED DURING THIS VISIT:  New Prescriptions   No medications on file     Note:  This document was prepared using Dragon voice recognition software and may include unintentional dictation errors.     Merlyn Lot, MD 01/12/20 [phone]

## 2020-01-13 ENCOUNTER — Inpatient Hospital Stay (HOSPITAL_COMMUNITY)
Admission: AD | Admit: 2020-01-13 | Discharge: 2020-01-18 | DRG: 054 | Disposition: A | Discharge status: Home / Self Care | Payer: Medicare Other | Source: Other Acute Inpatient Hospital | Attending: Internal Medicine | Admitting: Internal Medicine

## 2020-01-13 ENCOUNTER — Inpatient Hospital Stay (HOSPITAL_COMMUNITY): Payer: Medicare Other

## 2020-01-13 DIAGNOSIS — Z888 Allergy status to other drugs, medicaments and biological substances status: Secondary | ICD-10-CM | POA: Diagnosis not present

## 2020-01-13 DIAGNOSIS — G40909 Epilepsy, unspecified, not intractable, without status epilepticus: Secondary | ICD-10-CM | POA: Diagnosis present

## 2020-01-13 DIAGNOSIS — E872 Acidosis: Secondary | ICD-10-CM | POA: Diagnosis present

## 2020-01-13 DIAGNOSIS — Z79899 Other long term (current) drug therapy: Secondary | ICD-10-CM | POA: Diagnosis not present

## 2020-01-13 DIAGNOSIS — R600 Localized edema: Secondary | ICD-10-CM | POA: Diagnosis present

## 2020-01-13 DIAGNOSIS — E039 Hypothyroidism, unspecified: Secondary | ICD-10-CM | POA: Diagnosis present

## 2020-01-13 DIAGNOSIS — D320 Benign neoplasm of cerebral meninges: Secondary | ICD-10-CM | POA: Diagnosis present

## 2020-01-13 DIAGNOSIS — Z79890 Hormone replacement therapy (postmenopausal): Secondary | ICD-10-CM

## 2020-01-13 DIAGNOSIS — I10 Essential (primary) hypertension: Secondary | ICD-10-CM | POA: Diagnosis present

## 2020-01-13 DIAGNOSIS — G9349 Other encephalopathy: Secondary | ICD-10-CM | POA: Diagnosis present

## 2020-01-13 DIAGNOSIS — R0689 Other abnormalities of breathing: Secondary | ICD-10-CM | POA: Diagnosis present

## 2020-01-13 DIAGNOSIS — F17210 Nicotine dependence, cigarettes, uncomplicated: Secondary | ICD-10-CM | POA: Diagnosis present

## 2020-01-13 DIAGNOSIS — Z880 Allergy status to penicillin: Secondary | ICD-10-CM

## 2020-01-13 DIAGNOSIS — Z7289 Other problems related to lifestyle: Secondary | ICD-10-CM

## 2020-01-13 DIAGNOSIS — Z86011 Personal history of benign neoplasm of the brain: Secondary | ICD-10-CM | POA: Diagnosis not present

## 2020-01-13 DIAGNOSIS — R569 Unspecified convulsions: Secondary | ICD-10-CM

## 2020-01-13 DIAGNOSIS — G936 Cerebral edema: Secondary | ICD-10-CM | POA: Diagnosis present

## 2020-01-13 DIAGNOSIS — J9601 Acute respiratory failure with hypoxia: Secondary | ICD-10-CM

## 2020-01-13 DIAGNOSIS — Z9109 Other allergy status, other than to drugs and biological substances: Secondary | ICD-10-CM | POA: Diagnosis not present

## 2020-01-13 DIAGNOSIS — G9389 Other specified disorders of brain: Secondary | ICD-10-CM

## 2020-01-13 LAB — PHOSPHORUS: Phosphorus: 2.9 mg/dL (ref 2.5–4.6)

## 2020-01-13 LAB — CBC WITH DIFFERENTIAL/PLATELET
Abs Immature Granulocytes: 0.04 10*3/uL (ref 0.00–0.07)
Basophils Absolute: 0 10*3/uL (ref 0.0–0.1)
Basophils Relative: 0 %
Eosinophils Absolute: 0 10*3/uL (ref 0.0–0.5)
Eosinophils Relative: 0 %
HCT: 44.1 % (ref 39.0–52.0)
Hemoglobin: 15.3 g/dL (ref 13.0–17.0)
Immature Granulocytes: 1 %
Lymphocytes Relative: 10 %
Lymphs Abs: 0.5 10*3/uL — ABNORMAL LOW (ref 0.7–4.0)
MCH: 34.2 pg — ABNORMAL HIGH (ref 26.0–34.0)
MCHC: 34.7 g/dL (ref 30.0–36.0)
MCV: 98.7 fL (ref 80.0–100.0)
Monocytes Absolute: 0.1 10*3/uL (ref 0.1–1.0)
Monocytes Relative: 2 %
Neutro Abs: 4.8 10*3/uL (ref 1.7–7.7)
Neutrophils Relative %: 87 %
Platelets: 143 10*3/uL — ABNORMAL LOW (ref 150–400)
RBC: 4.47 MIL/uL (ref 4.22–5.81)
RDW: 12.9 % (ref 11.5–15.5)
WBC: 5.5 10*3/uL (ref 4.0–10.5)
nRBC: 0 % (ref 0.0–0.2)

## 2020-01-13 LAB — TRIGLYCERIDES: Triglycerides: 39 mg/dL (ref <150)

## 2020-01-13 LAB — COMPREHENSIVE METABOLIC PANEL
ALT: 21 U/L (ref 0–44)
AST: 33 U/L (ref 15–41)
Albumin: 3.5 g/dL (ref 3.5–5.0)
Alkaline Phosphatase: 51 U/L (ref 38–126)
Anion gap: 12 (ref 5–15)
BUN: 6 mg/dL (ref 6–20)
CO2: 24 mmol/L (ref 22–32)
Calcium: 9 mg/dL (ref 8.9–10.3)
Chloride: 102 mmol/L (ref 98–111)
Creatinine, Ser: 0.76 mg/dL (ref 0.61–1.24)
GFR calc Af Amer: >60 mL/min (ref >60)
GFR calc non Af Amer: >60 mL/min (ref >60)
Glucose, Bld: 141 mg/dL — ABNORMAL HIGH (ref 70–99)
Potassium: 3.4 mmol/L — ABNORMAL LOW (ref 3.5–5.1)
Sodium: 138 mmol/L (ref 135–145)
Total Bilirubin: 1.3 mg/dL — ABNORMAL HIGH (ref 0.3–1.2)
Total Protein: 6.8 g/dL (ref 6.5–8.1)

## 2020-01-13 LAB — LACTIC ACID, PLASMA: Lactic Acid, Venous: 1 mmol/L (ref 0.5–1.9)

## 2020-01-13 LAB — MRSA PCR SCREENING: MRSA by PCR: NEGATIVE

## 2020-01-13 LAB — MAGNESIUM: Magnesium: 1.9 mg/dL (ref 1.7–2.4)

## 2020-01-13 LAB — HIV ANTIBODY (ROUTINE TESTING W REFLEX): HIV Screen 4th Generation wRfx: NONREACTIVE

## 2020-01-13 MED ORDER — NICOTINE 21 MG/24HR TD PT24
21.0000 mg | MEDICATED_PATCH | Freq: Every day | TRANSDERMAL | Status: DC
  Administered 2020-01-13 – 2020-01-18 (×6): 21 mg via TRANSDERMAL
  Filled 2020-01-13 (×5): qty 1

## 2020-01-13 MED ORDER — DEXMEDETOMIDINE HCL IN NACL 400 MCG/100ML IV SOLN
0.4000 ug/kg/h | INTRAVENOUS | Status: DC
  Administered 2020-01-13: 1.3 ug/kg/h via INTRAVENOUS
  Administered 2020-01-13: 1.4 ug/kg/h via INTRAVENOUS
  Administered 2020-01-13 (×2): 1.6 ug/kg/h via INTRAVENOUS
  Administered 2020-01-14: 0.4 ug/kg/h via INTRAVENOUS
  Administered 2020-01-14: 0.9 ug/kg/h via INTRAVENOUS
  Administered 2020-01-14: 1.2 ug/kg/h via INTRAVENOUS
  Administered 2020-01-15: 0.8 ug/kg/h via INTRAVENOUS
  Administered 2020-01-15: 1 ug/kg/h via INTRAVENOUS
  Administered 2020-01-16: 0.6 ug/kg/h via INTRAVENOUS
  Filled 2020-01-13 (×3): qty 100
  Filled 2020-01-13: qty 200
  Filled 2020-01-13 (×9): qty 100

## 2020-01-13 MED ORDER — PROPOFOL 1000 MG/100ML IV EMUL
INTRAVENOUS | Status: AC
  Filled 2020-01-13: qty 100

## 2020-01-13 MED ORDER — PHENOBARBITAL SODIUM 130 MG/ML IJ SOLN
130.0000 mg | INTRAMUSCULAR | Status: AC
  Administered 2020-01-13: 130 mg via INTRAVENOUS
  Filled 2020-01-13: qty 1

## 2020-01-13 MED ORDER — MIDAZOLAM HCL 2 MG/2ML IJ SOLN: 2.0000 mg | INTRAMUSCULAR | Status: DC | PRN

## 2020-01-13 MED ORDER — PRO-STAT SUGAR FREE PO LIQD: 30.0000 mL | Freq: Every day | ORAL | Status: DC

## 2020-01-13 MED ORDER — HYDRALAZINE HCL 20 MG/ML IJ SOLN
20.0000 mg | INTRAMUSCULAR | Status: DC | PRN
  Administered 2020-01-13 – 2020-01-16 (×4): 20 mg via INTRAVENOUS
  Filled 2020-01-13 (×4): qty 1

## 2020-01-13 MED ORDER — THIAMINE HCL 100 MG PO TABS
100.0000 mg | ORAL_TABLET | Freq: Every day | ORAL | Status: DC
  Administered 2020-01-16 – 2020-01-18 (×3): 100 mg via ORAL
  Filled 2020-01-13 (×4): qty 1

## 2020-01-13 MED ORDER — PANTOPRAZOLE SODIUM 40 MG IV SOLR
40.0000 mg | Freq: Every day | INTRAVENOUS | Status: DC
  Administered 2020-01-13 – 2020-01-17 (×5): 40 mg via INTRAVENOUS
  Filled 2020-01-13 (×5): qty 40

## 2020-01-13 MED ORDER — LORAZEPAM 2 MG/ML IJ SOLN
1.0000 mg | INTRAMUSCULAR | Status: DC | PRN
  Administered 2020-01-14 – 2020-01-17 (×6): 2 mg via INTRAVENOUS
  Administered 2020-01-17: 1 mg via INTRAVENOUS
  Filled 2020-01-13 (×8): qty 1

## 2020-01-13 MED ORDER — ADULT MULTIVITAMIN W/MINERALS CH
1.0000 | ORAL_TABLET | Freq: Every day | ORAL | Status: DC
  Administered 2020-01-16 – 2020-01-18 (×3): 1 via ORAL
  Filled 2020-01-13 (×4): qty 1

## 2020-01-13 MED ORDER — LORAZEPAM 2 MG/ML IJ SOLN
INTRAMUSCULAR | Status: AC
  Filled 2020-01-13: qty 1

## 2020-01-13 MED ORDER — LABETALOL HCL 5 MG/ML IV SOLN: 10.0000 mg | INTRAVENOUS | Status: DC | PRN

## 2020-01-13 MED ORDER — HEPARIN SODIUM (PORCINE) 5000 UNIT/ML IJ SOLN
5000.0000 [IU] | Freq: Three times a day (TID) | INTRAMUSCULAR | Status: DC
  Administered 2020-01-13 – 2020-01-18 (×16): 5000 [IU] via SUBCUTANEOUS
  Filled 2020-01-13 (×17): qty 1

## 2020-01-13 MED ORDER — LACTATED RINGERS IV SOLN: INTRAVENOUS | Status: DC

## 2020-01-13 MED ORDER — THIAMINE HCL 100 MG/ML IJ SOLN
100.0000 mg | Freq: Every day | INTRAMUSCULAR | Status: DC
  Administered 2020-01-15: 100 mg via INTRAVENOUS
  Filled 2020-01-13 (×2): qty 2

## 2020-01-13 MED ORDER — FOLIC ACID 5 MG/ML IJ SOLN
1.0000 mg | Freq: Every day | INTRAMUSCULAR | Status: DC
  Administered 2020-01-15: 1 mg via INTRAVENOUS
  Filled 2020-01-13 (×5): qty 0.2

## 2020-01-13 MED ORDER — ORAL CARE MOUTH RINSE
15.0000 mL | OROMUCOSAL | Status: DC
  Administered 2020-01-13 – 2020-01-14 (×8): 15 mL via OROMUCOSAL

## 2020-01-13 MED ORDER — CHLORHEXIDINE GLUCONATE CLOTH 2 % EX PADS
6.0000 | MEDICATED_PAD | Freq: Every day | CUTANEOUS | Status: DC
  Administered 2020-01-13 – 2020-01-14 (×2): 6 via TOPICAL

## 2020-01-13 MED ORDER — POTASSIUM CHLORIDE 10 MEQ/100ML IV SOLN
10.0000 meq | INTRAVENOUS | Status: AC
  Administered 2020-01-13 (×3): 10 meq via INTRAVENOUS
  Filled 2020-01-13 (×3): qty 100

## 2020-01-13 MED ORDER — PRO-STAT SUGAR FREE PO LIQD: 30.0000 mL | Freq: Two times a day (BID) | ORAL | Status: DC

## 2020-01-13 MED ORDER — CHLORHEXIDINE GLUCONATE 0.12% ORAL RINSE (MEDLINE KIT)
15.0000 mL | Freq: Two times a day (BID) | OROMUCOSAL | Status: DC
  Administered 2020-01-13: 15 mL via OROMUCOSAL

## 2020-01-13 MED ORDER — KETAMINE HCL 10 MG/ML IJ SOLN
INTRAMUSCULAR | Status: AC
  Administered 2020-01-13: 200 mg
  Filled 2020-01-13: qty 1

## 2020-01-13 MED ORDER — LEVETIRACETAM IN NACL 1000 MG/100ML IV SOLN
1000.0000 mg | Freq: Two times a day (BID) | INTRAVENOUS | Status: DC
  Administered 2020-01-13 – 2020-01-16 (×7): 1000 mg via INTRAVENOUS
  Filled 2020-01-13 (×7): qty 100

## 2020-01-13 MED ORDER — FENTANYL 2500MCG IN NS 250ML (10MCG/ML) PREMIX INFUSION: 50.0000 ug/h | INTRAVENOUS | Status: DC

## 2020-01-13 MED ORDER — DEXAMETHASONE SODIUM PHOSPHATE 10 MG/ML IJ SOLN
10.0000 mg | Freq: Four times a day (QID) | INTRAMUSCULAR | Status: DC
  Administered 2020-01-13 – 2020-01-15 (×9): 10 mg via INTRAVENOUS
  Filled 2020-01-13 (×9): qty 1

## 2020-01-13 MED ORDER — CHLORHEXIDINE GLUCONATE 0.12% ORAL RINSE (MEDLINE KIT)
15.0000 mL | Freq: Two times a day (BID) | OROMUCOSAL | Status: DC
  Administered 2020-01-13 (×2): 15 mL via OROMUCOSAL

## 2020-01-13 MED ORDER — POTASSIUM CHLORIDE 20 MEQ PO PACK: 30.0000 meq | PACK | Freq: Four times a day (QID) | ORAL | Status: DC

## 2020-01-13 MED ORDER — FENTANYL BOLUS VIA INFUSION
50.0000 ug | INTRAVENOUS | Status: DC | PRN
  Filled 2020-01-13: qty 50

## 2020-01-13 MED ORDER — VITAL HIGH PROTEIN PO LIQD: 1000.0000 mL | ORAL | Status: DC

## 2020-01-13 MED ORDER — FOLIC ACID 1 MG PO TABS
1.0000 mg | ORAL_TABLET | Freq: Every day | ORAL | Status: DC
  Administered 2020-01-16 – 2020-01-18 (×3): 1 mg via ORAL
  Filled 2020-01-13 (×4): qty 1

## 2020-01-13 MED ORDER — VITAL AF 1.2 CAL PO LIQD: 1000.0000 mL | ORAL | Status: DC

## 2020-01-13 MED ORDER — THIAMINE HCL 100 MG/ML IJ SOLN
Freq: Once | INTRAVENOUS | Status: AC
  Filled 2020-01-13: qty 1000

## 2020-01-13 MED ORDER — LORAZEPAM 2 MG/ML IJ SOLN
INTRAMUSCULAR | Status: AC
  Administered 2020-01-13: 2 mg
  Filled 2020-01-13: qty 1

## 2020-01-13 MED ORDER — PROPOFOL 1000 MG/100ML IV EMUL
0.0000 ug/kg/min | INTRAVENOUS | Status: DC
  Administered 2020-01-13: 30 ug/kg/min via INTRAVENOUS
  Filled 2020-01-13 (×2): qty 100

## 2020-01-13 MED ORDER — LORAZEPAM 2 MG/ML IJ SOLN
2.0000 mg | Freq: Once | INTRAMUSCULAR | Status: AC
  Filled 2020-01-13: qty 1

## 2020-01-13 MED ORDER — LORAZEPAM 2 MG/ML IJ SOLN
4.0000 mg | Freq: Once | INTRAMUSCULAR | Status: AC
  Administered 2020-01-13: 4 mg via INTRAVENOUS

## 2020-01-13 MED ORDER — ORAL CARE MOUTH RINSE
15.0000 mL | OROMUCOSAL | Status: DC
  Administered 2020-01-13 (×2): 15 mL via OROMUCOSAL

## 2020-01-13 NOTE — Care Plan (Signed)
LTM eeg reviewed till 1710/ No seizures yet. Please review final report for details.    Frederick Drake

## 2020-01-13 NOTE — Procedures (Signed)
Patient Name: Frederick Drake  MRN: [id number]  Epilepsy Attending: Lora Havens  Referring Physician/Provider: Georgann Housekeeper, NP Date: 01/13/2020 Duration: 25.06 mins  Patient history: 59 year old male with history of epilepsy presented with breakthrough seizures.  CT head showed dural based mass concerning for meningioma in right anterior cranial fossa with significant white matter edema.  EEG to evaluate for seizures.  Level of alertness: Comatose  AEDs during EEG study: Propofol, Keppra  Technical aspects: This EEG study was done with scalp electrodes positioned according to the 10-20 International system of electrode placement. Electrical activity was acquired at a sampling rate of 500Hz  and reviewed with a high frequency filter of 70Hz  and a low frequency filter of 1Hz . EEG data were recorded continuously and digitally stored.   Description: EEG showed continuous generalized polymorphic 3 to 5 Hz theta-delta slowing admixed with 13 to 15 Hz generalized beta activity.  Abundant right frontotemporoparietal spikes and polyspikes were noted. hyperventilation and photic stimulation were not performed.     ABNORMALITY -Spikes, right frontotemporoparietal region -Continuous slow, generalized  IMPRESSION: This study showed evidence of epileptogenicity arising from right frontotemporal parietal region most likely secondary to underlying mass.  Additionally, there is evidence of severe diffuse encephalopathy, nonspecific etiology but could be related to sedation. No seizures were seen throughout the recording.     Shaylyn Bawa Barbra Sarks

## 2020-01-13 NOTE — H&P (View-Only) (Signed)
Reason for Consult:Brain tumor, right frontal Referring Physician: [name] is an 59 y.o. male.  HPI: whom I removed a left posterior temporal meningioma on 11/06/2016. He was lost to followup, last appt on 12/03/16. Yesterday he had multiple seizures, and was brought to the [facility] ED for treatment. Head CT showed right frontal mass grew in this interval, and is causing widespread edema in the right frontal lobe. Previously resected meningioma has not recurred. Called for a surgical opinion.  Past Medical History:  Diagnosis Date  . Allergy    Seasonal  . Brain tumor (benign) ([place])   . Carpal tunnel syndrome   . Headache   . Hypertension   . Neuropathy    Both legs  . Seizures ([place])     Past Surgical History:  Procedure Laterality Date  . CRANIOTOMY Left 11/06/2016   Procedure: LEFT Temporal CRANIOTOMY FOR TUMOR;  Surgeon: Frederick Pall, MD;  Location: [place];  Service: Neurosurgery;  Laterality: Left;  LEFT Temproal CRANIOTOMY FOR TUMOR   . HERNIA REPAIR    . VASECTOMY      Family History  Problem Relation Age of Onset  . Cancer Sister        Breast    Social History:  reports that he has been smoking cigarettes. He has a 80.00 pack-year smoking history. He has never used smokeless tobacco. He reports current alcohol use of about 12.0 standard drinks of alcohol per week. He reports that he does not use drugs.  Allergies:  Allergies  Allergen Reactions  . Chantix [Varenicline]   . Wasp Venom Swelling  . Penicillins Rash    Has patient had a PCN reaction causing immediate rash, facial/tongue/throat swelling, SOB or lightheadedness with hypotension:YES Has patient had a PCN reaction causing severe rash involving mucus membranes or skin necrosis: NO Has patient had a PCN reaction that required hospitalization NO Has patient had a PCN reaction occurring within the last 10 years: NO If all of the above answers are "NO", then may proceed with Cephalosporin use.     Medications: I have reviewed the patient's current medications.  Results for orders placed or performed during the hospital encounter of 01/13/20 (from the past 48 hour(s))  MRSA PCR Screening     Status: None   Collection Time: 01/13/20  1:27 AM   Specimen: Nasopharyngeal  Result Value Ref Range   MRSA by PCR NEGATIVE NEGATIVE    Comment:        The GeneXpert MRSA Assay (FDA approved for NASAL specimens only), is one component of a comprehensive MRSA colonization surveillance program. It is not intended to diagnose MRSA infection nor to guide or monitor treatment for MRSA infections. Performed at [facility], [place] [address], [place], [place] [address]   Lactic acid, plasma     Status: None   Collection Time: 01/13/20  7:05 AM  Result Value Ref Range   Lactic Acid, Venous 1.0 0.5 - 1.9 mmol/L    Comment: Performed at [facility] [address], [place], [place] [address]  HIV Antibody (routine testing w rflx)     Status: None   Collection Time: 01/13/20  7:05 AM  Result Value Ref Range   HIV Screen 4th Generation wRfx Non Reactive Non Reactive    Comment: Performed at [facility], [place] [address] [place],  [address]  Comprehensive metabolic panel     Status: Abnormal   Collection Time: 01/13/20  7:05 AM  Result Value Ref Range   Sodium 138 135 - 145 mmol/L   Potassium 3.4 (L) 3.5 - 5.1 mmol/L   Chloride 102 98 - 111 mmol/L   CO2 24 22 - 32 mmol/L   Glucose, Bld 141 (H) 70 - 99 mg/dL    Comment: Glucose reference range applies only to samples taken after fasting for at least 8 hours.   BUN 6 6 - 20 mg/dL   Creatinine, Ser 0.76 0.61 - 1.24 mg/dL   Calcium 9.0 8.9 - 10.3 mg/dL   Total Protein 6.8 6.5 - 8.1 g/dL   Albumin 3.5 3.5 - 5.0 g/dL   AST 33 15 - 41 U/L   ALT 21 0 - 44 U/L   Alkaline Phosphatase 51 38 - 126 U/L   Total Bilirubin 1.3 (H) 0.3 - 1.2 mg/dL   GFR calc non Af Amer >60 >60 mL/min   GFR calc Af Amer >60 >60 mL/min    Anion gap 12 5 - 15    Comment: Performed at [facility], [place] [address], [place], [place] [address]  CBC WITH DIFFERENTIAL     Status: Abnormal   Collection Time: 01/13/20  7:05 AM  Result Value Ref Range   WBC 5.5 4.0 - 10.5 K/uL   RBC 4.47 4.22 - 5.81 MIL/uL   Hemoglobin 15.3 13.0 - 17.0 g/dL   HCT 44.1 39.0 - 52.0 %   MCV 98.7 80.0 - 100.0 fL   MCH 34.2 (H) 26.0 - 34.0 pg   MCHC 34.7 30.0 - 36.0 g/dL   RDW 12.9 11.5 - 15.5 %   Platelets 143 (L) 150 - 400 K/uL   nRBC 0.0 0.0 - 0.2 %   Neutrophils Relative % 87 %   Neutro Abs 4.8 1.7 - 7.7 K/uL   Lymphocytes Relative 10 %   Lymphs Abs 0.5 (L) 0.7 - 4.0 K/uL   Monocytes Relative 2 %   Monocytes Absolute 0.1 0.1 - 1.0 K/uL   Eosinophils Relative 0 %   Eosinophils Absolute 0.0 0.0 - 0.5 K/uL   Basophils Relative 0 %   Basophils Absolute 0.0 0.0 - 0.1 K/uL   Immature Granulocytes 1 %   Abs Immature Granulocytes 0.04 0.00 - 0.07 K/uL    Comment: Performed at [facility], [place] [address] [place], [place] [address]  Triglycerides     Status: None   Collection Time: 01/13/20  7:05 AM  Result Value Ref Range   Triglycerides 39 <150 mg/dL    Comment: Performed at [facility] [address] [place], [place] [address]  Magnesium     Status: None   Collection Time: 01/13/20  7:05 AM  Result Value Ref Range   Magnesium 1.9 1.7 - 2.4 mg/dL    Comment: Performed at [facility] [address], [place], [place] [address]  Phosphorus     Status: None   Collection Time: 01/13/20  7:05 AM  Result Value Ref Range   Phosphorus 2.9 2.5 - 4.6 mg/dL    Comment: Performed at [facility] [address] [place], [place] [address]    CT Head Wo Contrast  Result Date: 01/12/2020 CLINICAL DATA:  Seizure. EXAM: CT HEAD WITHOUT CONTRAST TECHNIQUE: Contiguous axial images were obtained from the base of the skull through the vertex without intravenous contrast. COMPARISON:  November 18, 2016. FINDINGS: Brain: 4.3 x  4.0 cm dural-based mass is seen inferiorly in right anterior  cranial fossa resulting in a significantly increased amount of subcortical white matter edema compared to prior exam. This also results in 11 mm of right to left midline shift in the frontal region. This mass appears to be enlarged compared to prior exam and is most consistent with enlarging meningioma. No ventricular enlargement is noted. No acute infarction is noted. No definite hemorrhage is noted. Vascular: No hyperdense vessel or unexpected calcification. Skull: Status post left parietal craniotomy. Sinuses/Orbits: No acute finding. Other: None. IMPRESSION: 4.3 x 4.0 cm dural-based mass is seen inferiorly in right anterior cranial fossa resulting in a significantly increased amount of subcortical white matter edema compared to prior exam. This also results in 11 mm of right to left midline shift in the frontal region. This mass appears to be enlarged compared to prior exam and is most consistent with enlarging meningioma. Further evaluation with MRI with and without gadolinium is recommended. Electronically Signed   By: Marijo Conception M.D.   On: 01/12/2020 20:04   DG Chest Portable 1 View  Result Date: 01/12/2020 CLINICAL DATA:  ETT placement EXAM: PORTABLE CHEST 1 VIEW COMPARISON:  Radiograph 01/12/2020 FINDINGS: Endotracheal tube in the mid trachea, 4.5 cm from the carina. Transesophageal tube tip terminates in the left upper quadrant with the side port just distal to the GE junction. Telemetry leads overlie the chest. Some low lung volumes with streaky basilar atelectasis as well as increasing vascular congestion and some developing hazy interstitial opacity which could reflect worsening atelectasis or developing edema. Cardiomediastinal contours are unremarkable for portable technique. No acute osseous or soft tissue abnormality. IMPRESSION: Satisfactory positioning of the transesophageal and endotracheal tubes. Increasing vascular congestion  and developing hazy interstitial opacity which could reflect worsening atelectasis or developing edema. Electronically Signed   By: Lovena Le M.D.   On: 01/12/2020 23:09   DG Chest Portable 1 View  Result Date: 01/12/2020 CLINICAL DATA:  Seizure, evaluate for aspiration. EXAM: PORTABLE CHEST 1 VIEW COMPARISON:  Radiograph 11/06/2016 FINDINGS: Low lung volumes. The right costophrenic angle is inadvertently excluded from the field of view. Unchanged heart size and mediastinal contours. Bronchovascular crowding versus vascular congestion. Minor streaky retrocardiac opacity. No confluent airspace disease. No pneumothorax or large pleural effusion. Remote left rib fracture. IMPRESSION: 1. Low lung volumes with bronchovascular crowding versus vascular congestion. Minor streaky retrocardiac opacity is typical of atelectasis, however can be seen in the setting of aspiration. 2. Right costophrenic angle is inadvertently excluded from the field of view. Electronically Signed   By: Keith Rake M.D.   On: 01/12/2020 19:59   EEG adult  Result Date: 01/13/2020 Frederick Havens, MD     01/13/2020 10:18 AM Patient Name: Frederick Drake MRN: [id number] Epilepsy Attending: Lora Drake Referring Physician/Provider: Georgann Housekeeper, NP Date: 01/13/2020 Duration: 25.06 mins Patient history: 59 year old male with history of epilepsy presented with breakthrough seizures.  CT head showed dural based mass concerning for meningioma in right anterior cranial fossa with significant white matter edema.  EEG to evaluate for seizures. Level of alertness: Comatose AEDs during EEG study: Propofol, Keppra Technical aspects: This EEG study was done with scalp electrodes positioned according to the 10-20 International system of electrode placement. Electrical activity was acquired at a sampling rate of 500Hz  and reviewed with a high frequency filter of 70Hz  and a low frequency filter of 1Hz . EEG data were recorded continuously and  digitally stored. Description: EEG showed continuous generalized polymorphic 3 to 5 Hz theta-delta slowing admixed with 13  to 15 Hz generalized beta activity.  Abundant right frontotemporoparietal spikes and polyspikes were noted. hyperventilation and photic stimulation were not performed.   ABNORMALITY -Spikes, right frontotemporoparietal region -Continuous slow, generalized IMPRESSION: This study showed evidence of epileptogenicity arising from right frontotemporal parietal region most likely secondary to underlying mass.  Additionally, there is evidence of severe diffuse encephalopathy, nonspecific etiology but could be related to sedation. No seizures were seen throughout the recording. Frederick Drake    Review of Systems Blood pressure (!) 145/80, pulse (!) 48, temperature 98.6 F (37 C), resp. rate 16, height 6' (1.829 m), weight 95.7 kg, SpO2 100 %. Physical Exam  Constitutional: He appears well-developed and well-nourished.  Sedated currently. Reportedly has been quite agitated Not following commands Cannot do detailed neuroexam  HENT:  Well healed scar left temporal parietal region  Eyes: Pupils are equal, round, and reactive to light.    Assessment/Plan: Frederick Drake is a 58 y.o. male admitted last night for multiple seizures on 01/12/20. Head CT shows right frontal meningioma with significant edema. Currently on continuous eeg. Will need mass resected, hope for next week.  I have spoken with his wife and daughter. I have explained the need for tumor resection.   Frederick Drake 01/13/2020, 1:39 PM

## 2020-01-13 NOTE — Consult Note (Signed)
Reason for Consult:Brain tumor, right frontal Referring Physician: Bevely Palmer is an 59 y.o. male.  HPI: whom I removed a left posterior temporal meningioma on 11/06/2016. He was lost to followup, last appt on 12/03/16. Yesterday he had multiple seizures, and was brought to the [facility] ED for treatment. Head CT showed right frontal mass grew in this interval, and is causing widespread edema in the right frontal lobe. Previously resected meningioma has not recurred. Called for a surgical opinion.  Past Medical History:  Diagnosis Date  . Allergy    Seasonal  . Brain tumor (benign) ([place])   . Carpal tunnel syndrome   . Headache   . Hypertension   . Neuropathy    Both legs  . Seizures ([place])     Past Surgical History:  Procedure Laterality Date  . CRANIOTOMY Left 11/06/2016   Procedure: LEFT Temporal CRANIOTOMY FOR TUMOR;  Surgeon: Ashok Pall, MD;  Location: [facility];  Service: Neurosurgery;  Laterality: Left;  LEFT Temproal CRANIOTOMY FOR TUMOR   . HERNIA REPAIR    . VASECTOMY      Family History  Problem Relation Age of Onset  . Cancer Sister        Breast    Social History:  reports that he has been smoking cigarettes. He has a 80.00 pack-year smoking history. He has never used smokeless tobacco. He reports current alcohol use of about 12.0 standard drinks of alcohol per week. He reports that he does not use drugs.  Allergies:  Allergies  Allergen Reactions  . Chantix [Varenicline]   . Wasp Venom Swelling  . Penicillins Rash    Has patient had a PCN reaction causing immediate rash, facial/tongue/throat swelling, SOB or lightheadedness with hypotension:YES Has patient had a PCN reaction causing severe rash involving mucus membranes or skin necrosis: NO Has patient had a PCN reaction that required hospitalization NO Has patient had a PCN reaction occurring within the last 10 years: NO If all of the above answers are "NO", then may proceed with Cephalosporin use.     Medications: I have reviewed the patient's current medications.  Results for orders placed or performed during the hospital encounter of 01/13/20 (from the past 48 hour(s))  MRSA PCR Screening     Status: None   Collection Time: 01/13/20  1:27 AM   Specimen: Nasopharyngeal  Result Value Ref Range   MRSA by PCR NEGATIVE NEGATIVE    Comment:        The GeneXpert MRSA Assay (FDA approved for NASAL specimens only), is one component of a comprehensive MRSA colonization surveillance program. It is not intended to diagnose MRSA infection nor to guide or monitor treatment for MRSA infections. Performed at [facility], [place] [address], [place], [place] [address]   Lactic acid, plasma     Status: None   Collection Time: 01/13/20  7:05 AM  Result Value Ref Range   Lactic Acid, Venous 1.0 0.5 - 1.9 mmol/L    Comment: Performed at [facility] [address], [place], [place] [address]  HIV Antibody (routine testing w rflx)     Status: None   Collection Time: 01/13/20  7:05 AM  Result Value Ref Range   HIV Screen 4th Generation wRfx Non Reactive Non Reactive    Comment: Performed at [facility], [place] [address], [place], [place] [address]  Comprehensive metabolic panel     Status: Abnormal   Collection Time: 01/13/20  7:05 AM  Result Value Ref Range   Sodium 138 135 - 145 mmol/L   Potassium 3.4 (L) 3.5 - 5.1 mmol/L   Chloride 102 98 - 111 mmol/L   CO2 24 22 - 32 mmol/L   Glucose, Bld 141 (H) 70 - 99 mg/dL    Comment: Glucose reference range applies only to samples taken after fasting for at least 8 hours.   BUN 6 6 - 20 mg/dL   Creatinine, Ser 0.76 0.61 - 1.24 mg/dL   Calcium 9.0 8.9 - 10.3 mg/dL   Total Protein 6.8 6.5 - 8.1 g/dL   Albumin 3.5 3.5 - 5.0 g/dL   AST 33 15 - 41 U/L   ALT 21 0 - 44 U/L   Alkaline Phosphatase 51 38 - 126 U/L   Total Bilirubin 1.3 (H) 0.3 - 1.2 mg/dL   GFR calc non Af Amer >60 >60 mL/min   GFR calc Af Amer >60 >60 mL/min    Anion gap 12 5 - 15    Comment: Performed at [facility], [place] [address] [place], [place] [address]  CBC WITH DIFFERENTIAL     Status: Abnormal   Collection Time: 01/13/20  7:05 AM  Result Value Ref Range   WBC 5.5 4.0 - 10.5 K/uL   RBC 4.47 4.22 - 5.81 MIL/uL   Hemoglobin 15.3 13.0 - 17.0 g/dL   HCT 44.1 39.0 - 52.0 %   MCV 98.7 80.0 - 100.0 fL   MCH 34.2 (H) 26.0 - 34.0 pg   MCHC 34.7 30.0 - 36.0 g/dL   RDW 12.9 11.5 - 15.5 %   Platelets 143 (L) 150 - 400 K/uL   nRBC 0.0 0.0 - 0.2 %   Neutrophils Relative % 87 %   Neutro Abs 4.8 1.7 - 7.7 K/uL   Lymphocytes Relative 10 %   Lymphs Abs 0.5 (L) 0.7 - 4.0 K/uL   Monocytes Relative 2 %   Monocytes Absolute 0.1 0.1 - 1.0 K/uL   Eosinophils Relative 0 %   Eosinophils Absolute 0.0 0.0 - 0.5 K/uL   Basophils Relative 0 %   Basophils Absolute 0.0 0.0 - 0.1 K/uL   Immature Granulocytes 1 %   Abs Immature Granulocytes 0.04 0.00 - 0.07 K/uL    Comment: Performed at [facility], [place] [address], [place], [place] [address]  Triglycerides     Status: None   Collection Time: 01/13/20  7:05 AM  Result Value Ref Range   Triglycerides 39 <150 mg/dL    Comment: Performed at [facility] [address] [place], [place] [address]  Magnesium     Status: None   Collection Time: 01/13/20  7:05 AM  Result Value Ref Range   Magnesium 1.9 1.7 - 2.4 mg/dL    Comment: Performed at [facility] [address], [place], [place] [address]  Phosphorus     Status: None   Collection Time: 01/13/20  7:05 AM  Result Value Ref Range   Phosphorus 2.9 2.5 - 4.6 mg/dL    Comment: Performed at [facility] [address] [place], [place] [address]    CT Head Wo Contrast  Result Date: 01/12/2020 CLINICAL DATA:  Seizure. EXAM: CT HEAD WITHOUT CONTRAST TECHNIQUE: Contiguous axial images were obtained from the base of the skull through the vertex without intravenous contrast. COMPARISON:  November 18, 2016. FINDINGS: Brain: 4.3 x  4.0 cm dural-based mass is seen inferiorly in right anterior  cranial fossa resulting in a significantly increased amount of subcortical white matter edema compared to prior exam. This also results in 11 mm of right to left midline shift in the frontal region. This mass appears to be enlarged compared to prior exam and is most consistent with enlarging meningioma. No ventricular enlargement is noted. No acute infarction is noted. No definite hemorrhage is noted. Vascular: No hyperdense vessel or unexpected calcification. Skull: Status post left parietal craniotomy. Sinuses/Orbits: No acute finding. Other: None. IMPRESSION: 4.3 x 4.0 cm dural-based mass is seen inferiorly in right anterior cranial fossa resulting in a significantly increased amount of subcortical white matter edema compared to prior exam. This also results in 11 mm of right to left midline shift in the frontal region. This mass appears to be enlarged compared to prior exam and is most consistent with enlarging meningioma. Further evaluation with MRI with and without gadolinium is recommended. Electronically Signed   By: Marijo Conception M.D.   On: 01/12/2020 20:04   DG Chest Portable 1 View  Result Date: 01/12/2020 CLINICAL DATA:  ETT placement EXAM: PORTABLE CHEST 1 VIEW COMPARISON:  Radiograph 01/12/2020 FINDINGS: Endotracheal tube in the mid trachea, 4.5 cm from the carina. Transesophageal tube tip terminates in the left upper quadrant with the side port just distal to the GE junction. Telemetry leads overlie the chest. Some low lung volumes with streaky basilar atelectasis as well as increasing vascular congestion and some developing hazy interstitial opacity which could reflect worsening atelectasis or developing edema. Cardiomediastinal contours are unremarkable for portable technique. No acute osseous or soft tissue abnormality. IMPRESSION: Satisfactory positioning of the transesophageal and endotracheal tubes. Increasing vascular congestion  and developing hazy interstitial opacity which could reflect worsening atelectasis or developing edema. Electronically Signed   By: Lovena Le M.D.   On: 01/12/2020 23:09   DG Chest Portable 1 View  Result Date: 01/12/2020 CLINICAL DATA:  Seizure, evaluate for aspiration. EXAM: PORTABLE CHEST 1 VIEW COMPARISON:  Radiograph 11/06/2016 FINDINGS: Low lung volumes. The right costophrenic angle is inadvertently excluded from the field of view. Unchanged heart size and mediastinal contours. Bronchovascular crowding versus vascular congestion. Minor streaky retrocardiac opacity. No confluent airspace disease. No pneumothorax or large pleural effusion. Remote left rib fracture. IMPRESSION: 1. Low lung volumes with bronchovascular crowding versus vascular congestion. Minor streaky retrocardiac opacity is typical of atelectasis, however can be seen in the setting of aspiration. 2. Right costophrenic angle is inadvertently excluded from the field of view. Electronically Signed   By: Keith Rake M.D.   On: 01/12/2020 19:59   EEG adult  Result Date: 01/13/2020 Lora Havens, MD     01/13/2020 10:18 AM Patient Name: Frederick Drake MRN: [id number] Epilepsy Attending: Lora Havens Referring Physician/Provider: Georgann Housekeeper, NP Date: 01/13/2020 Duration: 25.06 mins Patient history: 59 year old male with history of epilepsy presented with breakthrough seizures.  CT head showed dural based mass concerning for meningioma in right anterior cranial fossa with significant white matter edema.  EEG to evaluate for seizures. Level of alertness: Comatose AEDs during EEG study: Propofol, Keppra Technical aspects: This EEG study was done with scalp electrodes positioned according to the 10-20 International system of electrode placement. Electrical activity was acquired at a sampling rate of 500Hz  and reviewed with a high frequency filter of 70Hz  and a low frequency filter of 1Hz . EEG data were recorded continuously and  digitally stored. Description: EEG showed continuous generalized polymorphic 3 to 5 Hz theta-delta slowing admixed with 13  to 15 Hz generalized beta activity.  Abundant right frontotemporoparietal spikes and polyspikes were noted. hyperventilation and photic stimulation were not performed.   ABNORMALITY -Spikes, right frontotemporoparietal region -Continuous slow, generalized IMPRESSION: This study showed evidence of epileptogenicity arising from right frontotemporal parietal region most likely secondary to underlying mass.  Additionally, there is evidence of severe diffuse encephalopathy, nonspecific etiology but could be related to sedation. No seizures were seen throughout the recording. Priyanka Barbra Sarks    Review of Systems Blood pressure (!) 145/80, pulse (!) 48, temperature 98.6 F (37 C), resp. rate 16, height 6' (1.829 m), weight 95.7 kg, SpO2 100 %. Physical Exam  Constitutional: He appears well-developed and well-nourished.  Sedated currently. Reportedly has been quite agitated Not following commands Cannot do detailed neuroexam  HENT:  Well healed scar left temporal parietal region  Eyes: Pupils are equal, round, and reactive to light.    Assessment/Plan: LINDLE MARMOLEJOS is a 59 y.o. male admitted last night for multiple seizures on 01/12/20. Head CT shows right frontal meningioma with significant edema. Currently on continuous eeg. Will need mass resected, hope for next week.  I have spoken with his wife and daughter. I have explained the need for tumor resection.   Ashok Pall 01/13/2020, 1:39 PM

## 2020-01-13 NOTE — Procedures (Signed)
Extubation Procedure Note  Patient Details:   Name: Frederick Drake DOB: May 08, 1961 MRN: [id number]   Airway Documentation:    Vent end date: 01/13/20 Vent end time: 1057   Evaluation  O2 sats: stable throughout Complications: No apparent complications Patient did tolerate procedure well. Bilateral Breath Sounds: Diminished   Yes   Pt extubated per MD order to room air. SATs >92%, pt is confused and refusing O2 at this time. Cuff leak heard prior to extubation, pt is able to speak. MD, RN, RT and family at bedside. RT will continue to monitor.  Esperanza Sheets T 01/13/2020, 10:58 AM

## 2020-01-13 NOTE — Progress Notes (Signed)
EEG completed, results pending. 

## 2020-01-13 NOTE — Progress Notes (Signed)
PCCM progress note  Brief history 59 year old male with history of meningioma status post resection presented by EMS to ED with seizures, eventually became somnolent requiring intubation for airway protection.  Patient was admitted by night team, please see full H&P for full description.  Physical exam  General: Middle-aged male lying in bed sedated on ventilator in no acute distress  HEENT: ETT, MM pink/moist, PERRL, sclera nonicteric, pupils pinpoint, upward gaze Neuro: Sedated on ventilator CV: s1s2 regular rate and rhythm, no murmur, rubs, or gallops,  PULM: Clear to auscultation bilaterally, tolerating that well GI: soft, bowel sounds active in all 4 quadrants, non-tender, non-distended Extremities: warm/dry, no edema  Skin: no rashes or lesions  Assessment and plan Acute seizure with known history of seizures Brain mass seen on admitting imaging concerning for recurrent meningioma  Acute encephalopathy -S/p left temporal craniotomy for meningioma on 11/06/2016 with Dr. Ashok Pall and Dr. Erline Levine  P: Neurosurgery consulted this AM, awaiting call back Spot EEG being set up now Based on EEG results will consult neurology  Frequent neuro assessments  Continue IV steroids  Continue Keppra   Acute hypoxemic respiratory failure -Patient received Versed via EMS due to seizure activity on arrival with good response, became agitated in ED requiring additional benzodiazepine after which he was alert and oriented.  Later became somnolent with sonorous respirations resulting in intubation for airway protection P: Continue ventilator support with lung protective strategies  Wean PEEP and FiO2 for sats greater than 90%. Head of bed elevated 30 degrees. Plateau pressures less than 30 cm H20.  Follow intermittent chest x-ray and ABG.   SAT/SBT as tolerated, mentation preclude extubation  Ensure adequate pulmonary hygiene  Follow cultures  VAP bundle in place  PAD  protocol  Hyponatremia P: Supplement as needed Trend BMET  Anion gap acidosis, resolved -Likely in the setting of acute seizure activity -Lactic acidosis negative

## 2020-01-13 NOTE — H&P (Signed)
NAME:  Frederick Drake, MRN:  [id number], DOB:  10-25-1960, LOS: 0 ADMISSION DATE:  01/13/2020, CONSULTATION DATE:  5/28 REFERRING MD:  Dr. Quentin [facility] ED. , CHIEF COMPLAINT:  seizure  Brief History   59 year old male with history of meningioma presenting with seizures. Intubated for airway protection in [facility] ED and transferred to [facility].    History of present illness   Patient is encephalopathic and/or intubated. Therefore history has been obtained from chart review.  59 year old male with PMH as below, which is significant for meningioma s/p resection. He was in his usual state of health until 5/28 when he had seizure like activity at home. This was treated by EMS with intranasal versed with good effect initially. He was post-ictal upon arrival to the ED. He then became agitated, which was ultimately treated with benzodiazepines. He did improve and was able to follow commands, but then eventually became less responsive with sonorous respirations and was ultimately intubated for airway protection. He was transferred to [facility] for ICU admission and neurosurgical evaluation.   Past Medical History   has a past medical history of Allergy, Brain tumor (benign) ([place]), Carpal tunnel syndrome, Headache, Hypertension, Neuropathy, and Seizures ([place]).   Significant Hospital Events   5/28 admit  Consults:    Procedures:    Significant Diagnostic Tests:  CT head 5/28 > 4.3 x 4.0 cm dural-based mass is seen inferiorly in right anterior cranial fossa resulting in a significantly increased amount of subcortical white matter edema compared to prior exam. This also results in 11 mm of right to left midline shift in the frontal region. This mass appears to be enlarged compared to prior exam and is most consistent with enlarging meningioma.  Micro Data:    Antimicrobials:     Interim history/subjective:    Objective   Temperature 98.8 F (37.1 C), temperature source Axillary, resp.  rate 18, SpO2 96 %.    Vent Mode: PRVC FiO2 (%):  [70 %] 70 % Set Rate:  [16 bmp] 16 bmp Vt Set:  [500 mL] 500 mL PEEP:  [5 cmH20] 5 cmH20 Plateau Pressure:  [11 cmH20] 11 cmH20  No intake or output data in the 24 hours ending 01/13/20 0153 There were no vitals filed for this visit.  Examination: General: Middle aged male in NAD HENT: [place]/AT, pupils pinpoint, no JVD Lungs: Clear bilateral breath sounds Cardiovascular: RRR, no MRG Abdomen: Soft, non-tender, non-distended Extremities: No acute deformity Neuro: Sedated  Resolved Hospital Problem list     Assessment & Plan:   Seizure: with history of same.  Brain mass: likely recurrence of meningioma.  Acute encephalopathy - Continue Keppra - Propofol for sedation. RASS goal -1 to -2 - Consult neurology if seizure occurs again - EEG - Consult neurosurgery in the morning.   Acute hypoxemic respiratory failure - Full vent support - VAP bundle - ABG  Anion gap acidosis: suspect this is lactic acidosis in the setting of seizure - check lactic acid  Best practice:  Diet: NPO Pain/Anxiety/Delirium protocol (if indicated): Prop/fent VAP protocol (if indicated): Per protocol DVT prophylaxis: SQH GI prophylaxis: PPI Glucose control: ICU Mobility: BR Code Status: FULL Family Communication: Unable to reach family despite multiple phone calls.  Disposition: ICU  Labs   CBC: Recent Labs  Lab 01/12/20 1902  WBC 9.5  NEUTROABS 7.2  HGB 16.4  HCT 49.1  MCV 103.6*  PLT [id number]    Basic Metabolic Panel: Recent Labs  Lab  01/12/20 1902  NA 138  K 3.6  CL 101  CO2 13*  GLUCOSE 127*  BUN 8  CREATININE 1.08  CALCIUM 9.1   GFR: CrCl cannot be calculated (Unknown ideal weight.). Recent Labs  Lab 01/12/20 1902  WBC 9.5    Liver Function Tests: Recent Labs  Lab 01/12/20 1902  AST 48*  ALT 23  ALKPHOS 70  BILITOT 0.6  PROT 8.4*  ALBUMIN 4.2   No results for input(s): LIPASE, AMYLASE in the last 168  hours. No results for input(s): AMMONIA in the last 168 hours.  ABG    Component Value Date/Time   PHART 7.39 01/12/2020 2305   PCO2ART 40 01/12/2020 2305   PO2ART 236 (H) 01/12/2020 2305   HCO3 24.2 01/12/2020 2305   TCO2 26 11/06/2016 1944   ACIDBASEDEF 0.7 01/12/2020 2305   O2SAT 99.8 01/12/2020 2305     Coagulation Profile: No results for input(s): INR, PROTIME in the last 168 hours.  Cardiac Enzymes: No results for input(s): CKTOTAL, CKMB, CKMBINDEX, TROPONINI in the last 168 hours.  HbA1C: Hemoglobin A1C  Date/Time Value Ref Range Status  03/05/2018 11:27 AM 4.6 4.0 - 5.6 % Final  11/21/2014 12:00 AM 5.4  Final   Hgb A1c MFr Bld  Date/Time Value Ref Range Status  07/02/2017 07:47 PM 5.9 (H) 4.8 - 5.6 % Final    Comment:             Prediabetes: 5.7 - 6.4          Diabetes: >6.4          Glycemic control for adults with diabetes: <7.0   10/17/2016 10:26 AM 5.2 4.8 - 5.6 % Final    Comment:    (NOTE)         Pre-diabetes: 5.7 - 6.4         Diabetes: >6.4         Glycemic control for adults with diabetes: <7.0     CBG: Recent Labs  Lab 01/12/20 1901  GLUCAP 127*    Review of Systems:   Patient is encephalopathic and/or intubated. Therefore history has been obtained from chart review.   Past Medical History  He,  has a past medical history of Allergy, Brain tumor (benign) ([place]), Carpal tunnel syndrome, Headache, Hypertension, Neuropathy, and Seizures ([place]).   Surgical History    Past Surgical History:  Procedure Laterality Date  . CRANIOTOMY Left 11/06/2016   Procedure: LEFT Temporal CRANIOTOMY FOR TUMOR;  Surgeon: Ashok Pall, MD;  Location: [facility];  Service: Neurosurgery;  Laterality: Left;  LEFT Temproal CRANIOTOMY FOR TUMOR   . HERNIA REPAIR    . VASECTOMY       Social History   reports that he has been smoking cigarettes. He has a 80.00 pack-year smoking history. He has never used smokeless tobacco. He reports current alcohol use of about 12.0  standard drinks of alcohol per week. He reports that he does not use drugs.   Family History   His family history includes Cancer in his sister.   Allergies Allergies  Allergen Reactions  . Chantix [Varenicline]   . Wasp Venom Swelling  . Penicillins Rash    Has patient had a PCN reaction causing immediate rash, facial/tongue/throat swelling, SOB or lightheadedness with hypotension:YES Has patient had a PCN reaction causing severe rash involving mucus membranes or skin necrosis: NO Has patient had a PCN reaction that required hospitalization NO Has patient had a PCN reaction occurring within the last  10 years: NO If all of the above answers are "NO", then may proceed with Cephalosporin use.     Home Medications  Prior to Admission medications   Medication Sig Start Date End Date Taking? Authorizing Provider  cyclobenzaprine (FLEXERIL) 10 MG tablet TAKE ONE TABLET BY MOUTH TWICE DAILY AS NEEDED FOR MUSCLE SPASMS 06/04/19   Lavera Guise, MD  gabapentin (NEURONTIN) 100 MG capsule TAKE ONE CAPSULE BY MOUTH THREE TIMES DAILY 06/04/19   Lavera Guise, MD  levothyroxine (SYNTHROID) 50 MCG tablet TAKE ONE TABLET BY MOUTH EVERY MORNING 06/21/19   Lavera Guise, MD  lisinopril (ZESTRIL) 40 MG tablet TAKE ONE TABLET BY MOUTH ONCE DAILY 06/21/19   Lavera Guise, MD  meloxicam (MOBIC) 7.5 MG tablet Take 1 tablet (7.5 mg total) by mouth daily as needed for pain. Patient not taking: Reported on 03/05/2018 07/23/17   Doles-Johnson, Teah, NP  nicotine (NICOTROL) 10 MG inhaler 6-16 catridges/day inhaled x 6-12 weeks, then taper dose over 6-12 week to discontinue smoking Patient not taking: Reported on 03/05/2018 07/02/17   Nori Riis, PA-C     Critical care time: 45 mins     Georgann Housekeeper, AGACNP-BC [place] for personal pager PCCM on call pager [phone]  01/13/2020 2:21 AM

## 2020-01-13 NOTE — Progress Notes (Signed)
vLTM EEG following Spot EEG. Pt has MRI compatible electrodes on. Notified Neuro

## 2020-01-13 NOTE — Progress Notes (Signed)
[facility] Progress Note Patient Name: [name] DOB: Aug 14, 1961 MRN: [id number]   Date of Service  01/13/2020  HPI/Events of Note  Pt transferred from [facility] ED with recurrence of previously resected meningioma and seizures, also altered mental status, intubated at [facility] secondary to concerns about airway protection, currently on the ventilator.  eICU Interventions  New Patient Evaluation completed, cEEG and neurology consultation ordered, Keppra continued, will obtain ABG to make sure PCO2 is optimized , given patient's vasogenic edema.        [name] U [name] 01/13/2020, 1:40 AM

## 2020-01-14 MED ORDER — ATROPINE SULFATE 1 MG/10ML IJ SOSY
PREFILLED_SYRINGE | INTRAMUSCULAR | Status: AC
  Filled 2020-01-14: qty 10

## 2020-01-14 MED ORDER — CHLORHEXIDINE GLUCONATE 0.12 % MT SOLN
15.0000 mL | Freq: Two times a day (BID) | OROMUCOSAL | Status: DC
  Administered 2020-01-14 – 2020-01-18 (×9): 15 mL via OROMUCOSAL
  Filled 2020-01-14 (×7): qty 15

## 2020-01-14 MED ORDER — ORAL CARE MOUTH RINSE
15.0000 mL | Freq: Two times a day (BID) | OROMUCOSAL | Status: DC
Start: 2020-01-14 — End: 2020-01-14

## 2020-01-14 MED ORDER — CHLORHEXIDINE GLUCONATE CLOTH 2 % EX PADS
6.0000 | MEDICATED_PAD | Freq: Every day | CUTANEOUS | Status: DC
  Administered 2020-01-14 – 2020-01-18 (×5): 6 via TOPICAL

## 2020-01-14 NOTE — Progress Notes (Signed)
NAME:  Frederick Drake, MRN:  [id number], DOB:  [birth year], LOS: 1 ADMISSION DATE:  01/13/2020, CONSULTATION DATE:  5/28 REFERRING MD:  Dr. Quentin [facility] ED. , CHIEF COMPLAINT:  seizure  Brief History   59 year old male with history of meningioma presenting with seizures. Intubated for airway protection in [facility] ED and transferred to [facility].    History of present illness   Patient is encephalopathic and/or intubated. Therefore history has been obtained from chart review.  59 year old male with PMH as below, which is significant for meningioma s/p resection. He was in his usual state of health until 5/28 when he had seizure like activity at home. This was treated by EMS with intranasal versed with good effect initially. He was post-ictal upon arrival to the ED. He then became agitated, which was ultimately treated with benzodiazepines. He did improve and was able to follow commands, but then eventually became less responsive with sonorous respirations and was ultimately intubated for airway protection. He was transferred to [facility] for ICU admission and neurosurgical evaluation.   Past Medical History   has a past medical history of Allergy, Brain tumor (benign) ([place]), Carpal tunnel syndrome, Headache, Hypertension, Neuropathy, and Seizures ([place]).   Significant Hospital Events   5/28 admit  Consults:  Neurosurgery Neurology  Procedures:  7/28 EEG  Significant Diagnostic Tests:  CT head 5/28 > 4.3 x 4.0 cm dural-based mass is seen inferiorly in right anterior cranial fossa resulting in a significantly increased amount of subcortical white matter edema compared to prior exam. This also results in 11 mm of right to left midline shift in the frontal region. This mass appears to be enlarged compared to prior exam and is most consistent with enlarging meningioma.  Micro Data:    Antimicrobials:  None  Interim history/subjective:  Requiring Precedex Calm  Objective   Blood  pressure 139/66, pulse (!) 49, temperature (!) 97.5 F (36.4 C), temperature source Axillary, resp. rate 14, height 6' (1.829 m), weight 95.5 kg, SpO2 98 %.        Intake/Output Summary (Last 24 hours) at 01/14/2020 [id number] Last data filed at 01/14/2020 0800 Gross per 24 hour  Intake 1561.53 ml  Output 1785 ml  Net -223.47 ml   Filed Weights   01/13/20 0200 01/14/20 0500  Weight: 95.7 kg 95.5 kg    Examination: General: Middle aged male in NAD HENT: [place]/AT, pupils pinpoint, no JVD Lungs: Clear breath sounds bilaterally Cardiovascular: S1-S2 appreciated Abdomen: Bowel sounds appreciated Extremities: No acute deformity Neuro: [facility] Problem list     Assessment & Plan:   Seizure: with history of same.  Brain mass: 4.3 x 4.0 dural based mass right anterior cranial fossa  Acute encephalopathy - Continue Keppra -On Precedex - EEG  Acute hypoxemic respiratory failure -Successfully extubated 5/28 -On room air -7.40/45/35 on venous BG  Anion gap acidosis: suspect this is lactic acidosis in the setting of seizure - check lactic acid  Best practice:  Diet: NPO Pain/Anxiety/Delirium protocol (if indicated): Precedex VAP protocol (if indicated): Not applicable DVT prophylaxis: SQH GI prophylaxis: PPI Glucose control: ICU Mobility: BR Code Status: FULL Family Communication: Unable to reach family despite multiple phone calls.  Disposition: ICU  Labs   CBC: Recent Labs  Lab 01/12/20 1902 01/13/20 0705  WBC 9.5 5.5  NEUTROABS 7.2 4.8  HGB 16.4 15.3  HCT 49.1 44.1  MCV 103.6* 98.7  PLT 161 143*    Basic Metabolic Panel:  Recent Labs  Lab 01/12/20 1902 01/13/20 0705  NA 138 138  K 3.6 3.4*  CL 101 102  CO2 13* 24  GLUCOSE 127* 141*  BUN 8 6  CREATININE 1.08 0.76  CALCIUM 9.1 9.0  MG  --  1.9  PHOS  --  2.9    Social History   reports that he has been smoking cigarettes. He has a 80.00 pack-year smoking history. He has never used  smokeless tobacco. He reports current alcohol use of about 12.0 standard drinks of alcohol per week. He reports that he does not use drugs.   The patient is critically ill with multiple organ systems failure and requires high complexity decision making for assessment and support, frequent evaluation and titration of therapies, application of advanced monitoring technologies and extensive interpretation of multiple databases. Critical Care Time devoted to patient care services described in this note independent of APP/resident time (if applicable)  is 30 minutes.   Sherrilyn Rist MD [facility] Personal pager: [phone] If unanswered, please page CCM On-call: [phone]

## 2020-01-14 NOTE — Procedures (Addendum)
Patient Name: Frederick Drake  MRN: [id number]  Epilepsy Attending: Lora Havens  Referring Physician/Provider: Dr Karena Addison Aroor Duration: 01/13/2020 1005 to 01/14/2020 1005  Patient history: 59 year old male with history of epilepsy presented with breakthrough seizures.  CT head showed dural based mass concerning for meningioma in right anterior cranial fossa with significant white matter edema.  EEG to evaluate for seizures.  Level of alertness: Comatose  AEDs during EEG study: Propofol, Keppra  Technical aspects: This EEG study was done with scalp electrodes positioned according to the 10-20 International system of electrode placement. Electrical activity was acquired at a sampling rate of 500Hz  and reviewed with a high frequency filter of 70Hz  and a low frequency filter of 1Hz . EEG data were recorded continuously and digitally stored.   Description: EEG showed continuous generalized polymorphic 3 to 5 Hz theta-delta slowing admixed with 13 to 15 Hz generalized beta activity.  Frequent right frontotemporoparietal spikes and polyspikes were noted. Rare independent sharp waves were also seen in left frontotemporal region, maximal F7. Hyperventilation and photic stimulation were not performed.     ABNORMALITY - Spikes, right frontotemporoparietal region - Sharp waves, left frontotemporal region - Continuous slow, generalized  IMPRESSION: This study showed evidence of independent epileptogenicity arising from right frontotemporal parietal region (~95%) as well as left frontotemporal region (~5%).  Additionally, there is evidence of severe diffuse encephalopathy, nonspecific etiology but could be related to sedation. No seizures were seen throughout the recording.    Frederick Drake Frederick Drake

## 2020-01-14 NOTE — Consult Note (Signed)
Neurology Consultation Reason for Consult: Seizures Referring Physician: Ander Slade, A  CC: Seizures  History is obtained from:chart review  HPI: Frederick Drake is a 59 y.o. male with a history of previous meningioma who presented yesterday to [facility] with recurrent seizures was found to have a large meningioma.  He had recurrent seizures, and did not return to baseline necessitating intubation.  He was transferred from [place] to [facility] overnight in the wee hours of 5/28 and extubated that day.  He remained persistently confused and therefore was started on continuous EEG yesterday.  He continues to improve and no seizures seen thus far.   ROS: A 14 point ROS was performed and is negative except as noted in the HPI.   Past Medical History:  Diagnosis Date  . Allergy    Seasonal  . Brain tumor (benign) ([place])   . Carpal tunnel syndrome   . Headache   . Hypertension   . Neuropathy    Both legs  . Seizures ([place])      Family History  Problem Relation Age of Onset  . Cancer Sister        Breast     Social History:  reports that he has been smoking cigarettes. He has a 80.00 pack-year smoking history. He has never used smokeless tobacco. He reports current alcohol use of about 12.0 standard drinks of alcohol per week. He reports that he does not use drugs.   Exam: Current vital signs: BP (!) 172/85   Pulse (!) 54   Temp 98.8 F (37.1 C)   Resp 18   Ht 6' (1.829 m)   Wt 95.5 kg   SpO2 100%   BMI 28.55 kg/m  Vital signs in last 24 hours: Temp:  [96.6 F (35.9 C)-98.8 F (37.1 C)] 98.8 F (37.1 C) (05/29 1900) Pulse Rate:  [42-71] 54 (05/29 1900) Resp:  [13-21] 18 (05/29 1900) BP: (100-180)/(54-111) 172/85 (05/29 1900) SpO2:  [95 %-100 %] 100 % (05/29 1900) Weight:  [95.5 kg] 95.5 kg (05/29 0500)   Physical Exam  Constitutional: Appears well-developed and well-nourished.  Psych: Affect appropriate to situation Eyes: No scleral injection HENT: No  OP obstrucion MSK: no joint deformities.  Cardiovascular: Normal rate and regular rhythm.  Respiratory: Effort normal, non-labored breathing GI: Soft.  No distension. There is no tenderness.  Skin: WDI  Neuro: Mental Status: Patient is awake, alert, oriented to person, unable to give month or year, knows hospital but not which one.  No signs of aphasia or neglect Cranial Nerves: II: Visual Fields are full. Pupils are equal, round, and reactive to light.   III,IV, VI: EOMI without ptosis or diploplia.  V: Facial sensation is symmetric to temperature VII: Facial movement is symmetric.  VIII: hearing is intact to voice X: Uvula elevates symmetrically XI: Shoulder shrug is symmetric. XII: tongue is midline without atrophy or fasciculations.  Motor: Tone is normal. Bulk is normal. 5/5 strength was present in both arms and right leg, , he has mild weakness to confrontation in the left leg 4/5 .  Sensory: Sensation is diminishe dot LT in the lef tleg Cerebellar: Tremulous bilaterally, L > R     I have reviewed labs in epic and the results pertinent to this consultation are: Sodium 138 Creatinine 0.7 Magnesium 1.9  I have reviewed the images obtained: CT head-large mass with surrounding edema on the right  EEG from 5/28-5/29 was read as negative for seizures, only interictal spikes  Impression: 59 yo  M with seizures in the setting of brain mass. His MS is improving, likely combination of post-ictal state and edema. If no seizures today, can d/c continuous EEG and continue keppra at current dose.    Recommendations: 1) keppra 1 g bid 2) steroids/tumor management per nsgy 3) If EEG is negative, neurology will sign off.    Roland Rack, MD [facility] [phone]  If 7pm- 7am, please page neurology on call as listed in [place].

## 2020-01-14 NOTE — Progress Notes (Signed)
LTM maint complete - no skin breakdown under: A1 fp1 f3 f7

## 2020-01-14 NOTE — Progress Notes (Signed)
  NEUROSURGERY PROGRESS NOTE   No issues overnight. Pt remains on LTEEG.  EXAM:  BP 139/66 (BP Location: Left Arm)   Pulse (!) 49   Temp (!) 97.5 F (36.4 C) (Axillary)   Resp 14   Ht 6' (1.829 m)   Wt 95.5 kg   SpO2 98%   BMI 28.55 kg/m   Arouses, confused. Not appropriate CN grossly intact  Follows simple commands MAE well  IMPRESSION:  59 y.o. male presenting with SZ and large right frontal tumor likely meningioma.  PLAN: - Will likely need MRI after EEG is removed - Planning on OR early next week for resection by Dr. Christella Noa

## 2020-01-15 MED ORDER — CLONIDINE HCL 0.1 MG PO TABS
0.2000 mg | ORAL_TABLET | Freq: Four times a day (QID) | ORAL | Status: DC
  Administered 2020-01-15 – 2020-01-18 (×11): 0.2 mg via ORAL
  Filled 2020-01-15: qty 2
  Filled 2020-01-15: qty 1
  Filled 2020-01-15: qty 2
  Filled 2020-01-15: qty 1
  Filled 2020-01-15: qty 2
  Filled 2020-01-15: qty 1
  Filled 2020-01-15 (×2): qty 2
  Filled 2020-01-15: qty 1
  Filled 2020-01-15 (×2): qty 2
  Filled 2020-01-15: qty 1

## 2020-01-15 MED ORDER — DEXAMETHASONE SODIUM PHOSPHATE 10 MG/ML IJ SOLN
6.0000 mg | Freq: Four times a day (QID) | INTRAMUSCULAR | Status: DC
  Administered 2020-01-15 – 2020-01-18 (×13): 6 mg via INTRAVENOUS
  Filled 2020-01-15: qty 1
  Filled 2020-01-15: qty 0.6
  Filled 2020-01-15: qty 1
  Filled 2020-01-15 (×5): qty 0.6
  Filled 2020-01-15: qty 1
  Filled 2020-01-15: qty 0.6
  Filled 2020-01-15: qty 1
  Filled 2020-01-15: qty 0.6
  Filled 2020-01-15 (×2): qty 1

## 2020-01-15 NOTE — Progress Notes (Addendum)
NAME:  Frederick Drake, MRN:  [id number], DOB:  1961/07/25, LOS: 2 ADMISSION DATE:  01/13/2020, CONSULTATION DATE:  5/28 REFERRING MD:  Dr. Quentin Cornwall [facility] ED. , CHIEF COMPLAINT:  seizure  Brief History   59 year old male with history of meningioma presenting with seizures. Intubated for airway protection in [facility] ED and transferred to [facility].    History of present illness   Patient is encephalopathic and/or intubated. Therefore history has been obtained from chart review.  59 year old male with PMH as below, which is significant for meningioma s/p resection. He was in his usual state of health until 5/28 when he had seizure like activity at home. This was treated by EMS with intranasal versed with good effect initially. He was post-ictal upon arrival to the ED. He then became agitated, which was ultimately treated with benzodiazepines. He did improve and was able to follow commands, but then eventually became less responsive with sonorous respirations and was ultimately intubated for airway protection. He was transferred to [facility] for ICU admission and neurosurgical evaluation.   Past Medical History   has a past medical history of Allergy, Brain tumor (benign) ([place]), Carpal tunnel syndrome, Headache, Hypertension, Neuropathy, and Seizures ([place]).   Significant Hospital Events   5/28 admit  Consults:  Neurosurgery Neurology  Procedures:  7/28 EEG  Significant Diagnostic Tests:  CT head 5/28 > 4.3 x 4.0 cm dural-based mass is seen inferiorly in right anterior cranial fossa resulting in a significantly increased amount of subcortical white matter edema compared to prior exam. This also results in 11 mm of right to left midline shift in the frontal region. This mass appears to be enlarged compared to prior exam and is most consistent with enlarging meningioma.  Micro Data:    Antimicrobials:  None  Interim history/subjective:  Calm Awake and interactive Needs frequent  reorientation  Objective   Blood pressure (!) 165/80, pulse (!) 52, temperature 98.8 F (37.1 C), temperature source Bladder, resp. rate (!) 22, height 6' (1.829 m), weight 95.2 kg, SpO2 97 %.        Intake/Output Summary (Last 24 hours) at 01/15/2020 1159 Last data filed at 01/15/2020 0900 Gross per 24 hour  Intake 620.07 ml  Output 600 ml  Net 20.07 ml   Filed Weights   01/13/20 0200 01/14/20 0500 01/15/20 0500  Weight: 95.7 kg 95.5 kg 95.2 kg    Examination: General: Middle aged male in NAD HENT: [place]/AT, pupils pinpoint, no JVD Lungs: Clear breath sounds to auscultation bilaterally Cardiovascular: Sinus appreciated Abdomen: Bowel sounds appreciated Extremities: No acute deformity Neuro: [facility] Problem list     Assessment & Plan:   Seizure: with history of same.  Brain mass: 4.3 x 4.0 dural based mass right anterior cranial fossa -Seen by neurosurgery -For surgical intervention  Acute encephalopathy - Continue Keppra -On Precedex - EEG -Wean off Precedex, start on clonidine  Acute hypoxemic respiratory failure -Successfully extubated 5/28 -On room air -7.40/45/35 on venous BG -Respiratory status is remained stable  Anion gap acidosis: suspect this is lactic acidosis in the setting of seizure - check lactic acid-was 1  Best practice:  Diet: Advance diet as tolerated Pain/Anxiety/Delirium protocol (if indicated): Precedex VAP protocol (if indicated): Not applicable DVT prophylaxis: SQH GI prophylaxis: PPI Glucose control: ICU Mobility: BR Code Status: FULL Family Communication: Spoke with family at bedside Disposition: ICU  Labs   CBC: Recent Labs  Lab 01/12/20 1902 01/13/20 0705  WBC  9.5 5.5  NEUTROABS 7.2 4.8  HGB 16.4 15.3  HCT 49.1 44.1  MCV 103.6* 98.7  PLT 161 143*    Basic Metabolic Panel: Recent Labs  Lab 01/12/20 1902 01/13/20 0705  NA 138 138  K 3.6 3.4*  CL 101 102  CO2 13* 24  GLUCOSE 127* 141*  BUN  8 6  CREATININE 1.08 0.76  CALCIUM 9.1 9.0  MG  --  1.9  PHOS  --  2.9    Social History   reports that he has been smoking cigarettes. He has a 80.00 pack-year smoking history. He has never used smokeless tobacco. He reports current alcohol use of about 12.0 standard drinks of alcohol per week. He reports that he does not use drugs.   Sherrilyn Rist, MD [place] [facility] Pager: [phone]

## 2020-01-15 NOTE — Progress Notes (Addendum)
Reason for consult:   Subjective: Patient is somnolent but easily arousable and answers questions.  He is not oriented to place but oriented to the fact that he is in the hospital.  Follows commands   ROS: negative except above  Examination  Vital signs in last 24 hours: Temp:  [97.7 F (36.5 C)-99.1 F (37.3 C)] 98.8 F (37.1 C) (05/30 0800) Pulse Rate:  [46-98] 50 (05/30 0900) Resp:  [14-28] 21 (05/30 0900) BP: (100-180)/(56-111) 138/82 (05/30 0900) SpO2:  [94 %-100 %] 99 % (05/30 0900) Weight:  [95.2 kg] 95.2 kg (05/30 0500)  General: lying in bed CVS: pulse-normal rate and rhythm RS: breathing comfortably Extremities: normal   Neuro: MS: Alert, oriented to month and year, follows commands CN: pupils equal and reactive,  EOMI, face symmetric, tongue midline, normal sensation over face, Motor: Moves all 4 extremities against gravity Reflexes: plantars: flexor Coordination: normal Gait: not tested  Basic Metabolic Panel: Recent Labs  Lab 01/12/20 1902 01/13/20 0705  NA 138 138  K 3.6 3.4*  CL 101 102  CO2 13* 24  GLUCOSE 127* 141*  BUN 8 6  CREATININE 1.08 0.76  CALCIUM 9.1 9.0  MG  --  1.9  PHOS  --  2.9    CBC: Recent Labs  Lab 01/12/20 1902 01/13/20 0705  WBC 9.5 5.5  NEUTROABS 7.2 4.8  HGB 16.4 15.3  HCT 49.1 44.1  MCV 103.6* 98.7  PLT 161 143*     Coagulation Studies: No results for input(s): LABPROT, INR in the last 72 hours.  Imaging Reviewed: CT head shows large right frontal mass  cEEG from 5/28-5/30   ABNORMALITY - Spikes, right frontotemporoparietal region - Sharp waves, left frontotemporal region - Continuous slow, generalized  IMPRESSION: This study showed evidence ofindependentepileptogenicity arising fromright frontotemporal parietal region as well as left frontotemporal region.Additionally, there is evidence of moderate diffuse encephalopathy, nonspecific etiology. No seizures were seen throughout the  recording.  EEG appears to be improving compared to previous study.  ASSESSMENT AND PLAN  23-year male with right frontal meningioma presented with seizures.  Transferred from [facility] emergency department after being intubated.  Was on placed on continuous EEG on 5/28 -no seizures, but intermittent right frontotemporal spikes and left frontal sharp waves.  Sedation weaned yesterday and patient is now extubated.  Patient awake and following commands.  No clinical seizures.  Continues EEG does not show any electrographic seizures.  Neurology consulted specifically for seizure management.  Recommendations Continue Keppra 1 g twice daily Defer to neurosurgery regarding management of tumor including steroids/surgery as well as further imaging such as MRI No driving for 6 months-explained this to the patient and he voiced understanding.  Outpatient follow-up with neurology or neuro oncology in 4 weeks.   Neurology will be available as needed   Per [facility] statutes, patients with seizures are not allowed to drive until they have been seizure-free for six months. Use caution when using heavy equipment or power tools. Avoid working on ladders or at heights. Take showers instead of baths. Ensure the water temperature is not too high on the home water heater. Do not go swimming alone. Do not lock yourself in a room alone (i.e. bathroom). When caring for infants or small children, sit down when holding, feeding, or changing them to minimize risk of injury to the child in the event you have a seizure. Maintain good sleep hygiene. Avoid alcohol.    If Frederick Drake has another seizure, call 911  and bring them back to the ED if:       A.  The seizure lasts longer than 5 minutes.            B.  The patient doesn't wake shortly after the seizure or has new problems such as difficulty seeing, speaking or moving following the seizure       C.  The patient was injured during the seizure       D.   The patient has a temperature over 102 F (39C)       E.  The patient vomited during the seizure and now is having trouble breathing    Karena Addison Severus Brodzinski [facility] Pager Number [phone] For questions after 7pm please refer to AMION to reach the Neurologist on call

## 2020-01-15 NOTE — Procedures (Addendum)
Patient Name:Marius MCKAY FITZNER [id number] Epilepsy Attending:Ioannis Schuh Barbra Sarks Referring Physician/Provider:Dr Karena Addison Aroor Duration:01/14/2020 1005 to 01/15/2020 1052  Patient history:59 year old male with history of epilepsy presented with breakthrough seizures. CT head showed dural based mass concerning for meningioma in right anterior cranial fossa with significant white matter edema. EEG to evaluate for seizures.  Level of alertness:awake, asleep  AEDs during EEG study:Keppra  Technical aspects: This EEG study was done with scalp electrodes positioned according to the 10-20 International system of electrode placement. Electrical activity was acquired at a sampling rate of 500Hz  and reviewed with a high frequency filter of 70Hz  and a low frequency filter of 1Hz . EEG data were recorded continuously and digitally stored.   Description:During awake state, no clear posterior dominant rhythm was seen. Sleep was characterized by vertex waves, sleep spindles (12-14hz ), maximal frontocentral region. EEG showed continuous generalized polymorphic 5-10 Hz theta-alpha activity as well as intermittent 2-3hz  generalized delta slowing was noted.Independent right frontotemporoparietal spikes and left frontotemporal region, maximal F7 sharp waves were also seen in . Hyperventilation and photic stimulation were not performed.   ABNORMALITY - Spikes, right frontotemporoparietal region - Sharp waves, left frontotemporal region - Continuous slow, generalized  IMPRESSION: This study showed evidence of independent epileptogenicity arising fromright frontotemporal parietal region as well as left frontotemporal region. Additionally, there is evidence of moderate diffuse encephalopathy, nonspecific etiology. No seizures were seen throughout the recording.  EEG appears to be improving compared to previous study.  Claudie Rathbone Barbra Sarks

## 2020-01-15 NOTE — Progress Notes (Signed)
Subjective: Patient reports "do I get to get out of here today?"  Objective: Vital signs in last 24 hours: Temp:  [97.7 F (36.5 C)-99.1 F (37.3 C)] 99 F (37.2 C) (05/30 0600) Pulse Rate:  [46-98] 50 (05/30 0600) Resp:  [13-28] 23 (05/30 0600) BP: (100-180)/(56-111) 144/76 (05/30 0600) SpO2:  [94 %-100 %] 97 % (05/30 0600) Weight:  [95.2 kg] 95.2 kg (05/30 0500)  Intake/Output from previous day: 05/29 0701 - 05/30 0700 In: 531.6 [I.V.:431.6; IV Piggyback:100] Out: 500 [Urine:500] Intake/Output this shift: No intake/output data recorded.  Physical Exam: Awake, states name, age, not sure where he is.  MAEW to command.  No observed seizure activity.  Lab Results: Recent Labs    01/12/20 1902 01/13/20 0705  WBC 9.5 5.5  HGB 16.4 15.3  HCT 49.1 44.1  PLT 161 143*   BMET Recent Labs    01/12/20 1902 01/13/20 0705  NA 138 138  K 3.6 3.4*  CL 101 102  CO2 13* 24  GLUCOSE 127* 141*  BUN 8 6  CREATININE 1.08 0.76  CALCIUM 9.1 9.0    Studies/Results: EEG adult  Result Date: 01/13/2020 Lora Havens, MD     01/13/2020 10:18 AM Patient Name: Frederick Drake MRN: [id number] Epilepsy Attending: Lora Havens Referring Physician/Provider: Georgann Housekeeper, NP Date: 01/13/2020 Duration: 25.06 mins Patient history: 59 year old male with history of epilepsy presented with breakthrough seizures.  CT head showed dural based mass concerning for meningioma in right anterior cranial fossa with significant white matter edema.  EEG to evaluate for seizures. Level of alertness: Comatose AEDs during EEG study: Propofol, Keppra Technical aspects: This EEG study was done with scalp electrodes positioned according to the 10-20 International system of electrode placement. Electrical activity was acquired at a sampling rate of 500Hz  and reviewed with a high frequency filter of 70Hz  and a low frequency filter of 1Hz . EEG data were recorded continuously and digitally stored. Description: EEG  showed continuous generalized polymorphic 3 to 5 Hz theta-delta slowing admixed with 13 to 15 Hz generalized beta activity.  Abundant right frontotemporoparietal spikes and polyspikes were noted. hyperventilation and photic stimulation were not performed.   ABNORMALITY -Spikes, right frontotemporoparietal region -Continuous slow, generalized IMPRESSION: This study showed evidence of epileptogenicity arising from right frontotemporal parietal region most likely secondary to underlying mass.  Additionally, there is evidence of severe diffuse encephalopathy, nonspecific etiology but could be related to sedation. No seizures were seen throughout the recording. Priyanka Barbra Sarks   Overnight EEG with video  Result Date: 01/14/2020 Lora Havens, MD     01/15/2020  6:50 AM Patient Name: Frederick Drake MRN: [id number] Epilepsy Attending: Lora Havens Referring Physician/Provider: Dr Karena Addison Aroor Duration: 01/13/2020 1005 to 01/14/2020 1005  Patient history: 59 year old male with history of epilepsy presented with breakthrough seizures.  CT head showed dural based mass concerning for meningioma in right anterior cranial fossa with significant white matter edema.  EEG to evaluate for seizures.  Level of alertness: Comatose  AEDs during EEG study: Propofol, Keppra  Technical aspects: This EEG study was done with scalp electrodes positioned according to the 10-20 International system of electrode placement. Electrical activity was acquired at a sampling rate of 500Hz  and reviewed with a high frequency filter of 70Hz  and a low frequency filter of 1Hz . EEG data were recorded continuously and digitally stored.  Description: EEG showed continuous generalized polymorphic 3 to 5 Hz theta-delta slowing admixed with 13 to 15 Hz generalized beta activity.  Frequent right frontotemporoparietal spikes and polyspikes were noted. Rare independent sharp waves were also seen in left frontotemporal region, maximal F7.  Hyperventilation and photic stimulation were not performed.    ABNORMALITY - Spikes, right frontotemporoparietal region - Sharp waves, left frontotemporal region - Continuous slow, generalized  IMPRESSION: This study showed evidence of independent epileptogenicity arising from right frontotemporal parietal region (~95%) as well as left frontotemporal region (~5%).  Additionally, there is evidence of severe diffuse encephalopathy, nonspecific etiology but could be related to sedation. No seizures were seen throughout the recording. Lora Havens    Assessment/Plan: Patient admitted with seizures, brain edema and new right frontal meningioma.  On decadron and continuous EEG monitoring.  Will get MRI of brain when EEG discontinued.  Decrease decadron to 6 mm Q 6 Hours.    LOS: 2 days    Peggyann Shoals, MD 01/15/2020, 8:06 AM

## 2020-01-15 NOTE — Progress Notes (Signed)
LTM EEG discontinued - no skin breakdown at unhook.   

## 2020-01-16 DIAGNOSIS — J9601 Acute respiratory failure with hypoxia: Secondary | ICD-10-CM

## 2020-01-16 MED ORDER — LEVETIRACETAM 500 MG PO TABS
1000.0000 mg | ORAL_TABLET | Freq: Two times a day (BID) | ORAL | Status: DC
  Administered 2020-01-16 – 2020-01-18 (×4): 1000 mg via ORAL
  Filled 2020-01-16 (×4): qty 2

## 2020-01-16 NOTE — Plan of Care (Signed)
Calm, cooperative on Precedex gtt @ 0.6 mcg/kg/hr. Oriented x 4. Safely removed restraints/posey vest. Bed alarm on. Frequent rounding.    Problem: Education: Goal: Knowledge of General Education information will improve Description: Including pain rating scale, medication(s)/side effects and non-pharmacologic comfort measures Outcome: Progressing   Problem: Health Behavior/Discharge Planning: Goal: Ability to manage health-related needs will improve Outcome: Progressing   Problem: Clinical Measurements: Goal: Ability to maintain clinical measurements within normal limits will improve Outcome: Progressing Goal: Will remain free from infection Outcome: Progressing Goal: Diagnostic test results will improve Outcome: Progressing   Problem: Activity: Goal: Risk for activity intolerance will decrease Outcome: Progressing   Problem: Nutrition: Goal: Adequate nutrition will be maintained Outcome: Progressing   Problem: Coping: Goal: Level of anxiety will decrease Outcome: Progressing   Problem: Elimination: Goal: Will not experience complications related to bowel motility Outcome: Progressing Goal: Will not experience complications related to urinary retention Outcome: Progressing   Problem: Pain Managment: Goal: General experience of comfort will improve Outcome: Progressing   Problem: Safety: Goal: Ability to remain free from injury will improve Outcome: Progressing   Problem: Skin Integrity: Goal: Risk for impaired skin integrity will decrease Outcome: Progressing

## 2020-01-16 NOTE — Progress Notes (Signed)
Subjective: Patient reports feeling a lot better.    Objective: Vital signs in last 24 hours: Temp:  [97.7 F (36.5 C)-99 F (37.2 C)] 98.6 F (37 C) (05/31 0700) Pulse Rate:  [44-75] 54 (05/31 0700) Resp:  [12-24] 23 (05/31 0700) BP: (88-174)/(45-100) 115/62 (05/31 0700) SpO2:  [95 %-100 %] 96 % (05/31 0700) Weight:  [101.1 kg] 101.1 kg (05/31 0630)  Intake/Output from previous day: 05/30 0701 - 05/31 0700 In: 2123.2 [P.O.:1560; I.V.:363.2; IV Piggyback:200] Out: 2375 [Urine:2375] Intake/Output this shift: No intake/output data recorded.  Physical Exam: Up in chair, speaking.  EEG electrodes removed.  Participating in self-care, cooperative.  MAEW.  Lab Results: No results for input(s): WBC, HGB, HCT, PLT in the last 72 hours. BMET No results for input(s): NA, K, CL, CO2, GLUCOSE, BUN, CREATININE, CALCIUM in the last 72 hours.  Studies/Results: No results found.  Assessment/Plan: Patient is no longer seizing and is much improved.  Will proceed with MRI.  Dr. Christella Noa to discuss surgery with patient in AM.    LOS: 3 days    Peggyann Shoals, MD 01/16/2020, 9:57 AM

## 2020-01-16 NOTE — Progress Notes (Addendum)
NAME:  Frederick Drake, MRN:  [id number], DOB:  1961/06/04, LOS: 3 ADMISSION DATE:  01/13/2020, CONSULTATION DATE:  5/28 REFERRING MD:  Dr. Quentin [facility] ED. , CHIEF COMPLAINT:  seizure  Brief History   59 year old male with history of meningioma presenting with seizures. Intubated for airway protection in [facility] ED and transferred to [facility].    History of present illness   Patient is encephalopathic and/or intubated. Therefore history has been obtained from chart review.  59 year old male with PMH as below, which is significant for meningioma s/p resection. He was in his usual state of health until 5/28 when he had seizure like activity at home. This was treated by EMS with intranasal versed with good effect initially. He was post-ictal upon arrival to the ED. He then became agitated, which was ultimately treated with benzodiazepines. He did improve and was able to follow commands, but then eventually became less responsive with sonorous respirations and was ultimately intubated for airway protection. He was transferred to [facility] for ICU admission and neurosurgical evaluation.   Past Medical History   has a past medical history of Allergy, Brain tumor (benign) ([place]), Carpal tunnel syndrome, Headache, Hypertension, Neuropathy, and Seizures ([place]).   Significant Hospital Events   5/28 admit  Consults:  Neurosurgery Neurology  Procedures:  7/28 EEG  cEEG from 5/28-5/30 - Spikes, right frontotemporoparietal region - Sharp waves, left frontotemporal region - Continuous slow, generalized  - No seizures were noted throughout the recording  Significant Diagnostic Tests:  CT head 5/28 > 4.3 x 4.0 cm dural-based mass is seen inferiorly in right anterior cranial fossa resulting in a significantly increased amount of subcortical white matter edema compared to prior exam. This also results in 11 mm of right to left midline shift in the frontal region. This mass appears to be enlarged  compared to prior exam and is most consistent with enlarging meningioma.   Micro Data:  5/27/ 2021>> SARS Coronavirus 2 >> Negative  Antimicrobials:  None  Interim history/subjective:  Calm Alert. Oriented and appropriate No further seizure activity For Surgery , ? On 6/1  Objective   Blood pressure 115/62, pulse (!) 54, temperature 98.6 F (37 C), resp. rate (!) 23, height 6' (1.829 m), weight 101.1 kg, SpO2 96 %.        Intake/Output Summary (Last 24 hours) at 01/16/2020 0926 Last data filed at 01/16/2020 [id number] Gross per 24 hour  Intake 2084.88 ml  Output 1975 ml  Net 109.88 ml   Filed Weights   01/14/20 0500 01/15/20 0500 01/16/20 0630  Weight: 95.5 kg 95.2 kg 101.1 kg    Examination: General: Middle aged male in NAD, on RA HENT: [place]/AT, PERRLA, No LAD, No JVD Lungs: Bilateral chest excursion, Clear breath sounds to auscultation , few rhonchi Cardiovascular: S1, S2, RRR No RMG, NSR per tele Abdomen: Soft, ND, NT, BS +  Extremities: No acute deformity,normal bulk and tione, No edema Neuro: Awake, alert and oriented, MAE x 4, Follows commands, appropriate  Resolved Hospital Problem list     Assessment & Plan:   Seizure: with history of same.  Brain mass: 4.3 x 4.0 dural based mass right anterior cranial fossa -Seen by neurosurgery -For surgical intervention possibly 6/1 by Dr. Larose Hires - Trend and replete mag as needed  Acute encephalopathy - Continue Keppra - On Precedex - EEG -Wean off Precedex, start on clonidine  Acute hypoxemic respiratory failure Current every day smoker since age 25 ( 43  pack year smoking history) -Successfully extubated 5/28 -On room air -7.40/45/35 on venous BG -Respiratory status is remained stable - Would benefit from Pulmonary eval as OP as he experiences dyspnea with exertion, suspect emphysema vs COPD - Qualifies for Lung Cancer Screening   Anion gap acidosis: suspect this is lactic acidosis in the setting of seizure -  check lactic acid-was 1    Best practice:  Diet: Advance diet as tolerated Pain/Anxiety/Delirium protocol (if indicated):NA VAP protocol (if indicated): Not applicable DVT prophylaxis: SQH GI prophylaxis: PPI Glucose control: ICU Mobility: BR Code Status: FULL Family Communication: Pt. Updated in full at bedside. Disposition: ICU  Labs   CBC: Recent Labs  Lab 01/12/20 1902 01/13/20 0705  WBC 9.5 5.5  NEUTROABS 7.2 4.8  HGB 16.4 15.3  HCT 49.1 44.1  MCV 103.6* 98.7  PLT 161 143*    Basic Metabolic Panel: Recent Labs  Lab 01/12/20 1902 01/13/20 0705  NA 138 138  K 3.6 3.4*  CL 101 102  CO2 13* 24  GLUCOSE 127* 141*  BUN 8 6  CREATININE 1.08 0.76  CALCIUM 9.1 9.0  MG  --  1.9  PHOS  --  2.9    Social History   reports that he has been smoking cigarettes. He has a 80.00 pack-year smoking history. He has never used smokeless tobacco. He reports current alcohol use of about 12.0 standard drinks of alcohol per week. He reports that he does not use drugs.   Magdalen Spatz, MSN, AGACNP-BC [place] for personal pager PCCM on call pager [phone] 01/16/2020 9:26 AM

## 2020-01-17 ENCOUNTER — Other Ambulatory Visit: Payer: Self-pay | Admitting: Neurosurgery

## 2020-01-17 ENCOUNTER — Inpatient Hospital Stay (HOSPITAL_COMMUNITY): Payer: Medicare Other

## 2020-01-17 DIAGNOSIS — E876 Hypokalemia: Secondary | ICD-10-CM

## 2020-01-17 LAB — BASIC METABOLIC PANEL
Anion gap: 12 (ref 5–15)
BUN: 13 mg/dL (ref 6–20)
CO2: 25 mmol/L (ref 22–32)
Calcium: 8.3 mg/dL — ABNORMAL LOW (ref 8.9–10.3)
Chloride: 100 mmol/L (ref 98–111)
Creatinine, Ser: 0.76 mg/dL (ref 0.61–1.24)
GFR calc Af Amer: >60 mL/min (ref >60)
GFR calc non Af Amer: >60 mL/min (ref >60)
Glucose, Bld: 145 mg/dL — ABNORMAL HIGH (ref 70–99)
Potassium: 3.3 mmol/L — ABNORMAL LOW (ref 3.5–5.1)
Sodium: 137 mmol/L (ref 135–145)

## 2020-01-17 LAB — TSH: TSH: 1.168 u[IU]/mL (ref 0.350–4.500)

## 2020-01-17 LAB — MAGNESIUM: Magnesium: 1.8 mg/dL (ref 1.7–2.4)

## 2020-01-17 MED ORDER — GADOBUTROL 1 MMOL/ML IV SOLN
10.0000 mL | Freq: Once | INTRAVENOUS | Status: AC | PRN
  Administered 2020-01-17: 10 mL via INTRAVENOUS

## 2020-01-17 NOTE — Progress Notes (Signed)
PROGRESS NOTE    Frederick Drake  [id number]  DOB: 06/17/1961  PCP: Kendell Bane, NP Admit date:01/13/2020 59 y/o male with h/o tobacco use, meningioma s/p resection. He was in his usual state of health until 5/28 when he had seizure like activity at home. This was treated by EMS with intranasal versed with good effect initially. He was post-ictal upon arrival to the ED. He then became agitated, which was ultimately treated with benzodiazepines. He did improve and was able to follow commands, but then became less responsive with sonorous respirations and was ultimately intubated for airway protection.  Hospital course: Patient admitted to [facility] with Keppra and Precedex. Successfully extubated 5/28 -On room air now. CT shows 4.3 x 4.0 dural based mass right anterior cranial fossa resulting in a significantly increased amount of subcortical white matter edema. This also results in 11 mm of right to left midline shift in the frontal region. This mass appears to be enlarged compared to prior exam and is most consistent with enlarging meningioma. Seen by Neurosurgery , planned for MRI and surgical intervention.  Subjective:  Patient sitting in bed and watching TV.  Wife at bedside.  Denies any complaints of headache or further seizure activity since admission.  Objective: Vitals:   01/17/20 0412 01/17/20 0500 01/17/20 0629 01/17/20 0718  BP:   136/70 124/69  Pulse:    61  Resp:    20  Temp: 98.2 F (36.8 C)   98.2 F (36.8 C)  TempSrc: Oral     SpO2:    98%  Weight:  101.2 kg    Height:        Intake/Output Summary (Last 24 hours) at 01/17/2020 0745 Last data filed at 01/16/2020 1740 Gross per 24 hour  Intake 647.54 ml  Output 1450 ml  Net -802.46 ml   Filed Weights   01/15/20 0500 01/16/20 0630 01/17/20 0500  Weight: 95.2 kg 101.1 kg 101.2 kg    Physical Examination:  General exam: Appears calm and comfortable  Respiratory system: Clear to auscultation.  Respiratory effort normal. Cardiovascular system: S1 & S2 heard, RRR. No JVD, murmurs, rubs, gallops or clicks. No pedal edema. Gastrointestinal system: Abdomen is nondistended, soft and nontender. Normal bowel sounds heard. Central nervous system: Alert and oriented. No new focal neurological deficits. Extremities: No contractures, edema or joint deformities.  Skin: No rashes, lesions or ulcers Psychiatry: Judgement and insight appear normal. Mood & affect appropriate.   Data Reviewed: I have personally reviewed following labs and imaging studies  CBC: Recent Labs  Lab 01/12/20 1902 01/13/20 0705  WBC 9.5 5.5  NEUTROABS 7.2 4.8  HGB 16.4 15.3  HCT 49.1 44.1  MCV 103.6* 98.7  PLT 161 [id number]*   Basic Metabolic Panel: Recent Labs  Lab 01/12/20 1902 01/13/20 0705  NA 138 138  K 3.6 3.4*  CL 101 102  CO2 13* 24  GLUCOSE 127* 141*  BUN 8 6  CREATININE 1.08 0.76  CALCIUM 9.1 9.0  MG  --  1.9  PHOS  --  2.9   GFR: Estimated Creatinine Clearance: 123.9 mL/min (by C-G formula based on SCr of 0.76 mg/dL). Liver Function Tests: Recent Labs  Lab 01/12/20 1902 01/13/20 0705  AST 48* 33  ALT 23 21  ALKPHOS 70 51  BILITOT 0.6 1.3*  PROT 8.4* 6.8  ALBUMIN 4.2 3.5   No results for input(s): LIPASE, AMYLASE in the last 168 hours. No results for input(s): AMMONIA in the last 168 hours. Coagulation  Profile: No results for input(s): INR, PROTIME in the last 168 hours. Cardiac Enzymes: No results for input(s): CKTOTAL, CKMB, CKMBINDEX, TROPONINI in the last 168 hours. BNP (last 3 results) No results for input(s): PROBNP in the last 8760 hours. HbA1C: No results for input(s): HGBA1C in the last 72 hours. CBG: Recent Labs  Lab 01/12/20 1901  GLUCAP 127*   Lipid Profile: No results for input(s): CHOL, HDL, LDLCALC, TRIG, CHOLHDL, LDLDIRECT in the last 72 hours. Thyroid Function Tests: No results for input(s): TSH, T4TOTAL, FREET4, T3FREE, THYROIDAB in the last 72  hours. Anemia Panel: No results for input(s): VITAMINB12, FOLATE, FERRITIN, TIBC, IRON, RETICCTPCT in the last 72 hours. Sepsis Labs: Recent Labs  Lab 01/13/20 0705  LATICACIDVEN 1.0    Recent Results (from the past 240 hour(s))  SARS Coronavirus 2 by RT PCR (hospital order, performed in [facility] hospital lab) Nasopharyngeal Nasopharyngeal Swab     Status: None   Collection Time: 01/12/20  8:29 PM   Specimen: Nasopharyngeal Swab  Result Value Ref Range Status   SARS Coronavirus 2 NEGATIVE NEGATIVE Final    Comment: (NOTE) SARS-CoV-2 target nucleic acids are NOT DETECTED. The SARS-CoV-2 RNA is generally detectable in upper and lower respiratory specimens during the acute phase of infection. The lowest concentration of SARS-CoV-2 viral copies this assay can detect is 250 copies / mL. A negative result does not preclude SARS-CoV-2 infection and should not be used as the sole basis for treatment or other patient management decisions.  A negative result may occur with improper specimen collection / handling, submission of specimen other than nasopharyngeal swab, presence of viral mutation(s) within the areas targeted by this assay, and inadequate number of viral copies (<250 copies / mL). A negative result must be combined with clinical observations, patient history, and epidemiological information. Fact Sheet for Patients:   StrictlyIdeas.no Fact Sheet for Healthcare Providers: BankingDealers.co.za This test is not yet approved or cleared  by the [place] FDA and has been authorized for detection and/or diagnosis of SARS-CoV-2 by FDA under an Emergency Use Authorization (EUA).  This EUA will remain in effect (meaning this test can be used) for the duration of the COVID-19 declaration under Section 564(b)(1) of the Act, 21 U.S.C. section 360bbb-3(b)(1), unless the authorization is terminated or revoked sooner. Performed at  [facility], [address] [place], [place] [address]   MRSA PCR Screening     Status: None   Collection Time: 01/13/20  1:27 AM   Specimen: Nasopharyngeal  Result Value Ref Range Status   MRSA by PCR NEGATIVE NEGATIVE Final    Comment:        The GeneXpert MRSA Assay (FDA approved for NASAL specimens only), is one component of a comprehensive MRSA colonization surveillance program. It is not intended to diagnose MRSA infection nor to guide or monitor treatment for MRSA infections. Performed at [facility], [place] [address] [place], [place] [address]       Radiology Studies: No results found.      Scheduled Meds: . chlorhexidine  15 mL Mouth Rinse BID  . Chlorhexidine Gluconate Cloth  6 each Topical Daily  . cloNIDine  0.2 mg Oral Q6H  . dexamethasone (DECADRON) injection  6 mg Intravenous [id number]  . folic acid  1 mg Oral Daily   Or  . folic acid  1 mg Intravenous Daily  . heparin  5,000 Units Subcutaneous Q8H  . levETIRAcetam  1,000 mg Oral Q12H  . multivitamin  with minerals  1 tablet Oral Daily  . nicotine  21 mg Transdermal Daily  . pantoprazole (PROTONIX) IV  40 mg Intravenous QHS  . thiamine injection  100 mg Intravenous Daily   Or  . thiamine  100 mg Oral Daily   Continuous Infusions:   Assessment/Plan:  1. Provoked seizure: in the setting of enlarging brain mass/meningioma. On decadron, Keppra. Seizure precautions.   2. Enlarging meningioma with cerebral edema/midline shift: Seen by Neurosurgery, plan for surgical intervention. F/U MRI today. On decadron  3. Encephalopathy with airway compromise: POA due to seizure and sedative medications (metabolic/toxic) . Now resolved.  4. Acute hypoxic respiratory failure: in the setting of above problems requiring intubation for airway compromise. Gives 80 pack year smoking history, will need outpatient f/u for PFTs, lung ca screening.   5. Mild hypokalemia: no labs since 5/28. Will recheck and  replace as needed.  DVT prophylaxis: Heparin Code Status: Full code Family / Patient Communication: Discussed with patient and wife at bedside Disposition Plan:   Status is: Inpatient  Remains inpatient appropriate because:Ongoing diagnostic testing needed not appropriate for outpatient work up   Dispo: The patient is from: Home              Anticipated d/c is to: TBD              Anticipated d/c date is: 3 days              Patient currently is not medically stable to d/c.         LOS: 4 days    Time spent: 35 mins    Guilford Shi, MD [facility] Pager in [place]  If 7PM-7AM, please contact night-coverage [email or web address] 01/17/2020, 7:45 AM

## 2020-01-17 NOTE — Progress Notes (Signed)
Patient ID: Frederick Drake, male   DOB: 1960/12/24, 59 y.o.   MRN: [id number] BP 124/69 (BP Location: Left Arm)    Pulse 61    Temp 98.2 F (36.8 C)    Resp 20    Ht 6' (1.829 m)    Wt 101.2 kg    SpO2 98%    BMI 30.26 kg/m  Alert and oriented x4, speech is clear and fluent Moving all extremities well Plan is Friday OR Mri is ordered for brain

## 2020-01-18 DIAGNOSIS — D320 Benign neoplasm of cerebral meninges: Principal | ICD-10-CM

## 2020-01-18 LAB — BASIC METABOLIC PANEL
Anion gap: 8 (ref 5–15)
BUN: 13 mg/dL (ref 6–20)
CO2: 26 mmol/L (ref 22–32)
Calcium: 8.6 mg/dL — ABNORMAL LOW (ref 8.9–10.3)
Chloride: 102 mmol/L (ref 98–111)
Creatinine, Ser: 0.79 mg/dL (ref 0.61–1.24)
GFR calc Af Amer: >60 mL/min (ref >60)
GFR calc non Af Amer: >60 mL/min (ref >60)
Glucose, Bld: 161 mg/dL — ABNORMAL HIGH (ref 70–99)
Potassium: 3.6 mmol/L (ref 3.5–5.1)
Sodium: 136 mmol/L (ref 135–145)

## 2020-01-18 MED ORDER — THIAMINE HCL 100 MG PO TABS: 100.0000 mg | ORAL_TABLET | Freq: Every day | ORAL | 0 refills | Status: DC

## 2020-01-18 MED ORDER — LEVETIRACETAM 1000 MG PO TABS: 1000.0000 mg | ORAL_TABLET | Freq: Two times a day (BID) | ORAL | 0 refills | Status: DC

## 2020-01-18 MED ORDER — DEXAMETHASONE 4 MG PO TABS
4.0000 mg | ORAL_TABLET | Freq: Three times a day (TID) | ORAL | 0 refills | Status: DC
Start: 2020-01-18 — End: 2020-01-22

## 2020-01-18 MED ORDER — FOLIC ACID 1 MG PO TABS: 1.0000 mg | ORAL_TABLET | Freq: Every day | ORAL | 0 refills | Status: DC

## 2020-01-18 MED ORDER — CLONIDINE HCL 0.2 MG PO TABS
0.2000 mg | ORAL_TABLET | Freq: Four times a day (QID) | ORAL | 7 refills | Status: DC
  Filled 2020-12-28: qty 60, 15d supply, fill #0
  Filled 2021-01-15: qty 60, 15d supply, fill #1

## 2020-01-18 MED ORDER — ADULT MULTIVITAMIN W/MINERALS CH: 1.0000 | ORAL_TABLET | Freq: Every day | ORAL | 0 refills | Status: DC

## 2020-01-18 NOTE — Progress Notes (Signed)
Patient is discharged from room 4NP14 at this time. Alert and in stable condition. IV sites d/c'd and instructions read to patient and spouse with understanding verbalized and all questions answered. Left unit via wheelchair with all belongings at side.

## 2020-01-18 NOTE — Discharge Summary (Signed)
Physician Discharge Summary  Frederick Drake [id number] DOB: [birth year] DOA: 01/13/2020  PCP: Kendell Bane, NP  Admit date: 01/13/2020 Discharge date: 01/18/2020  Recommendations for Outpatient Follow-up:  1. Discharge to home 2. No driving or operating heavy machinery until cleared by PCP/Neurosurgery 3. Follow up with PCP within one week. 4. Follow up with neurosurgery as directed.  Discharge Diagnoses: Principal diagnosis is #1 1. 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 Meningioma that has increased in size and has associated edema 3. Chronic lower extremity edema 4. Hypothyroidism 5. Hypertension 6. Tobacco abuse  Discharge Condition: Fair  Disposition: Home  Diet recommendation: Heart healthy  Filed Weights   01/16/20 0630 01/17/20 0500 01/18/20 0500  Weight: 101.1 kg 101.2 kg 101.2 kg    History of present illness:  Patient is encephalopathic and/or intubated. Therefore history has been obtained from chart review.  59 year old male with PMH as below, which is significant for meningioma s/p resection. He was in his usual state of health until 5/28 when he had seizure like activity at home. This was treated by EMS with intranasal versed with good effect initially. He was post-ictal upon arrival to the ED. He then became agitated, which was ultimately treated with benzodiazepines. He did improve and was able to follow commands, but then eventually became less responsive with sonorous respirations and was ultimately intubated for airway protection. He was transferred to [facility] for ICU admission and neurosurgical evaluation.   Hospital Course:  59 y/o male with h/o tobacco use, meningioma s/p resection. He was in his usual state of health until 5/28 when he had seizure like activity at home. This was treated by EMS with intranasal versed with good effect initially. He was post-ictal upon arrival to the ED. He then became agitated, which was ultimately treated with  benzodiazepines. He did improve and was able to follow commands, but then became less responsive with sonorous respirations and was ultimately intubated for airway protection.   Hospital course: Patient admitted to [facility] with Keppra and Precedex. Successfully extubated 5/28 -On room air now. CT shows 4.3 x 4.0 dural based mass right anterior cranial fossa resulting in a significantly increased amount of subcortical white matter edema. This also results in 11 mm of right to left midline shift in the frontal region. This mass appears to be enlarged compared to prior exam and is most consistent with enlarging meningioma. Seen by Neurosurgery , planned for MRI and surgical intervention. MRI Brain was performed on 01/17/2020. It has demonstrated: 1. 4.9 x 6.1 x 1.8 cm meningioma involving the right anterior cranial fossa with associated extensive vasogenic edema throughout the right frontal region. Localized right-to-left shift measures up to 1.3 cm. 2. Postoperative changes from prior left frontotemporal craniotomy for resection of left-sided meningioma. Small amount of residual tumor seen at the posterior left CP angle cistern with extension into the distal left transverse and sigmoid sinuses. 3. No other acute intracranial abnormality.  The patient has been evaluated by neurosurgery. They feel that the patient can be safely discharged from a neurosurgery standpoint. The patient will be discharged on decadron, and follow up with neurosurgery as directed for planned excision of meningioma at a later date.  Today's assessment: S: The patient is resting comfortably. No new complaints. He is anxious to go home. O: Vitals:  Vitals:   01/18/20 0735 01/18/20 1159  BP: 134/72 (!) 144/85  Pulse: (!) 59 64  Resp: 12 18  Temp: 97.6 F (36.4 C) 98.7 F (  37.1 C)  SpO2: 94% 94%   Exam:  Constitutional:  . The patient is awake, alert, and oriented x 3. No acute distress. Respiratory:  . No  increased work of breathing. . No wheezes, rales, or rhonchi . No tactile fremitus Cardiovascular:  . Regular rate and rhythm . No murmurs, ectopy, or gallups. . No lateral PMI. No thrills. Abdomen:  . Abdomen is soft, non-tender, non-distended . No hernias, masses, or organomegaly . Normoactive bowel sounds.  Musculoskeletal:  . No cyanosis, clubbing, or edema Skin:  . No rashes, lesions, ulcers . palpation of skin: no induration or nodules Neurologic:  . CN 2-12 intact . Sensation all 4 extremities intact Psychiatric:  . Mental status o Mood, affect appropriate o Orientation to person, place, time  . judgment and insight appear intact  Discharge Instructions  Discharge Instructions    Activity as tolerated - No restrictions   Complete by: As directed    Call MD for:   Complete by: As directed    Seizures.   Call MD for:  persistant nausea and vomiting   Complete by: As directed    Call MD for:  severe uncontrolled pain   Complete by: As directed    Diet - low sodium heart healthy   Complete by: As directed    Discharge instructions   Complete by: As directed    Discharge to home No driving or operating heavy machinery until cleared by PCP/Neurosurgery Follow up with PCP within one week. Follow up with neurosurgery as directed.   Increase activity slowly   Complete by: As directed      Allergies as of 01/18/2020      Reactions   Chantix [varenicline]    Wasp Venom Swelling   Penicillins Rash   Has patient had a PCN reaction causing immediate rash, facial/tongue/throat swelling, SOB or lightheadedness with hypotension:YES Has patient had a PCN reaction causing severe rash involving mucus membranes or skin necrosis: NO Has patient had a PCN reaction that required hospitalization NO Has patient had a PCN reaction occurring within the last 10 years: NO If all of the above answers are "NO", then may proceed with Cephalosporin use.      Medication List    STOP  taking these medications   cyclobenzaprine 10 MG tablet Commonly known as: FLEXERIL     TAKE these medications   cloNIDine 0.2 MG tablet Commonly known as: CATAPRES Take 1 tablet (0.2 mg total) by mouth every 6 (six) hours.   dexamethasone 4 MG tablet Commonly known as: DECADRON Take 1 tablet (4 mg total) by mouth 3 (three) times daily for 3 days.   folic acid 1 MG tablet Commonly known as: FOLVITE Take 1 tablet (1 mg total) by mouth daily. Start taking on: January 19, 2020   gabapentin 100 MG capsule Commonly known as: NEURONTIN TAKE ONE CAPSULE BY MOUTH THREE TIMES DAILY What changed: when to take this   levETIRAcetam 1000 MG tablet Commonly known as: KEPPRA Take 1 tablet (1,000 mg total) by mouth every 12 (twelve) hours.   levothyroxine 50 MCG tablet Commonly known as: SYNTHROID TAKE ONE TABLET BY MOUTH EVERY MORNING What changed: when to take this   lisinopril 40 MG tablet Commonly known as: ZESTRIL TAKE ONE TABLET BY MOUTH ONCE DAILY   meloxicam 7.5 MG tablet Commonly known as: Mobic Take 1 tablet (7.5 mg total) by mouth daily as needed for pain.   multivitamin with minerals Tabs tablet Take 1 tablet by mouth  daily. Start taking on: January 19, 2020   nicotine 10 MG inhaler Commonly known as: NICOTROL 6-16 catridges/day inhaled x 6-12 weeks, then taper dose over 6-12 week to discontinue smoking   thiamine 100 MG tablet Take 1 tablet (100 mg total) by mouth daily. Start taking on: January 19, 2020      Allergies  Allergen Reactions  . Chantix [Varenicline]   . Wasp Venom Swelling  . Penicillins Rash    Has patient had a PCN reaction causing immediate rash, facial/tongue/throat swelling, SOB or lightheadedness with hypotension:YES Has patient had a PCN reaction causing severe rash involving mucus membranes or skin necrosis: NO Has patient had a PCN reaction that required hospitalization NO Has patient had a PCN reaction occurring within the last 10 years: NO If  all of the above answers are "NO", then may proceed with Cephalosporin use.    The results of significant diagnostics from this hospitalization (including imaging, microbiology, ancillary and laboratory) are listed below for reference.    Significant Diagnostic Studies: CT Head Wo Contrast  Result Date: 01/12/2020 CLINICAL DATA:  Seizure. EXAM: CT HEAD WITHOUT CONTRAST TECHNIQUE: Contiguous axial images were obtained from the base of the skull through the vertex without intravenous contrast. COMPARISON:  November 18, 2016. FINDINGS: Brain: 4.3 x 4.0 cm dural-based mass is seen inferiorly in right anterior cranial fossa resulting in a significantly increased amount of subcortical white matter edema compared to prior exam. This also results in 11 mm of right to left midline shift in the frontal region. This mass appears to be enlarged compared to prior exam and is most consistent with enlarging meningioma. No ventricular enlargement is noted. No acute infarction is noted. No definite hemorrhage is noted. Vascular: No hyperdense vessel or unexpected calcification. Skull: Status post left parietal craniotomy. Sinuses/Orbits: No acute finding. Other: None. IMPRESSION: 4.3 x 4.0 cm dural-based mass is seen inferiorly in right anterior cranial fossa resulting in a significantly increased amount of subcortical white matter edema compared to prior exam. This also results in 11 mm of right to left midline shift in the frontal region. This mass appears to be enlarged compared to prior exam and is most consistent with enlarging meningioma. Further evaluation with MRI with and without gadolinium is recommended. Electronically Signed   By: Marijo Conception M.D.   On: 01/12/2020 20:04   MR BRAIN W WO CONTRAST  Result Date: 01/18/2020 CLINICAL DATA:  Initial evaluation for CNS neoplasm, history of right frontal and left posterior temporal meningiomas, status post resection on the left, seizure. EXAM: MRI HEAD WITHOUT AND WITH  CONTRAST TECHNIQUE: Multiplanar, multiecho pulse sequences of the brain and surrounding structures were obtained without and with intravenous contrast. CONTRAST:  41mL GADAVIST GADOBUTROL 1 MMOL/ML IV SOLN COMPARISON:  Prior head CT from 01/12/2020 as well as prior MRI from 10/17/2016. FINDINGS: Brain: Postoperative changes from prior left frontotemporal craniotomy for resection of left-sided meningioma is seen. Small amount of residual tumor seen at the posterior left CP angle cistern with extension into distal left transverse and sigmoid sinuses (series 10, images 66-39). Mild residual vasogenic edema seen within the posterior left temporal region (series 4, image 13). Superimposed scattered areas of encephalomalacia and gliosis present at the left parietotemporal region as well, likely postoperative. Associated scattered susceptibility artifact within this region, consistent with chronic blood products. Additional large meningioma centered at the anterior inferior right frontal lobe measures approximately 4.9 x 6.1 x 1.8 cm in greatest dimensions. Associated dural thickening and enhancement  seen overlying the right frontal convexity. Size of this lesion has increased as compared to most recent available MRI from 2018. Associated vasogenic edema throughout the adjacent right frontal lobe has also worsened. Localized right-to-left shift measures up to 1.3 cm at the level of the anterior falx (series 4, image 14). No hydrocephalus or ventricular trapping. Basilar cisterns remain widely patent. No diffusion weighted sequence performed to evaluate for possible ischemia or changes related to seizure. No acute intracranial hemorrhage. Vascular: Major intracranial vascular flow voids are maintained. Skull and upper cervical spine: Craniocervical junction within normal limits. Bone marrow signal intensity normal. Sequelae of prior left-sided craniotomy noted. Scalp soft tissues demonstrate no acute finding.  Sinuses/Orbits: Globes and orbital soft tissues within normal limits. Paranasal sinuses are clear. Trace left mastoid effusion noted. Other: None. IMPRESSION: 1. 4.9 x 6.1 x 1.8 cm meningioma involving the right anterior cranial fossa with associated extensive vasogenic edema throughout the right frontal region. Localized right-to-left shift measures up to 1.3 cm. 2. Postoperative changes from prior left frontotemporal craniotomy for resection of left-sided meningioma. Small amount of residual tumor seen at the posterior left CP angle cistern with extension into the distal left transverse and sigmoid sinuses. 3. No other acute intracranial abnormality. Electronically Signed   By: Jeannine Boga M.D.   On: 01/18/2020 00:23   DG Chest Portable 1 View  Result Date: 01/12/2020 CLINICAL DATA:  ETT placement EXAM: PORTABLE CHEST 1 VIEW COMPARISON:  Radiograph 01/12/2020 FINDINGS: Endotracheal tube in the mid trachea, 4.5 cm from the carina. Transesophageal tube tip terminates in the left upper quadrant with the side port just distal to the GE junction. Telemetry leads overlie the chest. Some low lung volumes with streaky basilar atelectasis as well as increasing vascular congestion and some developing hazy interstitial opacity which could reflect worsening atelectasis or developing edema. Cardiomediastinal contours are unremarkable for portable technique. No acute osseous or soft tissue abnormality. IMPRESSION: Satisfactory positioning of the transesophageal and endotracheal tubes. Increasing vascular congestion and developing hazy interstitial opacity which could reflect worsening atelectasis or developing edema. Electronically Signed   By: Lovena Le M.D.   On: 01/12/2020 23:09   DG Chest Portable 1 View  Result Date: 01/12/2020 CLINICAL DATA:  Seizure, evaluate for aspiration. EXAM: PORTABLE CHEST 1 VIEW COMPARISON:  Radiograph 11/06/2016 FINDINGS: Low lung volumes. The right costophrenic angle is  inadvertently excluded from the field of view. Unchanged heart size and mediastinal contours. Bronchovascular crowding versus vascular congestion. Minor streaky retrocardiac opacity. No confluent airspace disease. No pneumothorax or large pleural effusion. Remote left rib fracture. IMPRESSION: 1. Low lung volumes with bronchovascular crowding versus vascular congestion. Minor streaky retrocardiac opacity is typical of atelectasis, however can be seen in the setting of aspiration. 2. Right costophrenic angle is inadvertently excluded from the field of view. Electronically Signed   By: Keith Rake M.D.   On: 01/12/2020 19:59   EEG adult  Result Date: 01/13/2020 Lora Havens, MD     01/13/2020 10:18 AM Patient Name: Frederick Drake MRN: [id number] Epilepsy Attending: Lora Havens Referring Physician/Provider: Georgann Housekeeper, NP Date: 01/13/2020 Duration: 25.06 mins Patient history: 59 year old male with history of epilepsy presented with breakthrough seizures.  CT head showed dural based mass concerning for meningioma in right anterior cranial fossa with significant white matter edema.  EEG to evaluate for seizures. Level of alertness: Comatose AEDs during EEG study: Propofol, Keppra Technical aspects: This EEG study was done with scalp electrodes positioned according to the 10-20  International system of electrode placement. Electrical activity was acquired at a sampling rate of 500Hz  and reviewed with a high frequency filter of 70Hz  and a low frequency filter of 1Hz . EEG data were recorded continuously and digitally stored. Description: EEG showed continuous generalized polymorphic 3 to 5 Hz theta-delta slowing admixed with 13 to 15 Hz generalized beta activity.  Abundant right frontotemporoparietal spikes and polyspikes were noted. hyperventilation and photic stimulation were not performed.   ABNORMALITY -Spikes, right frontotemporoparietal region -Continuous slow, generalized IMPRESSION: This study  showed evidence of epileptogenicity arising from right frontotemporal parietal region most likely secondary to underlying mass.  Additionally, there is evidence of severe diffuse encephalopathy, nonspecific etiology but could be related to sedation. No seizures were seen throughout the recording. Priyanka Barbra Sarks   Overnight EEG with video  Result Date: 01/14/2020 Lora Havens, MD     01/15/2020  6:50 AM Patient Name: Frederick Drake MRN: [id number] Epilepsy Attending: Lora Havens Referring Physician/Provider: Dr Karena Addison Aroor Duration: 01/13/2020 1005 to 01/14/2020 1005  Patient history: 60 year old male with history of epilepsy presented with breakthrough seizures.  CT head showed dural based mass concerning for meningioma in right anterior cranial fossa with significant white matter edema.  EEG to evaluate for seizures.  Level of alertness: Comatose  AEDs during EEG study: Propofol, Keppra  Technical aspects: This EEG study was done with scalp electrodes positioned according to the 10-20 International system of electrode placement. Electrical activity was acquired at a sampling rate of 500Hz  and reviewed with a high frequency filter of 70Hz  and a low frequency filter of 1Hz . EEG data were recorded continuously and digitally stored.  Description: EEG showed continuous generalized polymorphic 3 to 5 Hz theta-delta slowing admixed with 13 to 15 Hz generalized beta activity.  Frequent right frontotemporoparietal spikes and polyspikes were noted. Rare independent sharp waves were also seen in left frontotemporal region, maximal F7. Hyperventilation and photic stimulation were not performed.    ABNORMALITY - Spikes, right frontotemporoparietal region - Sharp waves, left frontotemporal region - Continuous slow, generalized  IMPRESSION: This study showed evidence of independent epileptogenicity arising from right frontotemporal parietal region (~95%) as well as left frontotemporal region (~5%).   Additionally, there is evidence of severe diffuse encephalopathy, nonspecific etiology but could be related to sedation. No seizures were seen throughout the recording. Lora Havens    Microbiology: Recent Results (from the past 240 hour(s))  SARS Coronavirus 2 by RT PCR (hospital order, performed in [facility] hospital lab) Nasopharyngeal Nasopharyngeal Swab     Status: None   Collection Time: 01/12/20  8:29 PM   Specimen: Nasopharyngeal Swab  Result Value Ref Range Status   SARS Coronavirus 2 NEGATIVE NEGATIVE Final    Comment: (NOTE) SARS-CoV-2 target nucleic acids are NOT DETECTED. The SARS-CoV-2 RNA is generally detectable in upper and lower respiratory specimens during the acute phase of infection. The lowest concentration of SARS-CoV-2 viral copies this assay can detect is 250 copies / mL. A negative result does not preclude SARS-CoV-2 infection and should not be used as the sole basis for treatment or other patient management decisions.  A negative result may occur with improper specimen collection / handling, submission of specimen other than nasopharyngeal swab, presence of viral mutation(s) within the areas targeted by this assay, and inadequate number of viral copies (<250 copies / mL). A negative result must be combined with clinical observations, patient history, and epidemiological information. Fact Sheet for Patients:   StrictlyIdeas.no Fact Sheet  for Healthcare Providers: BankingDealers.co.za This test is not yet approved or cleared  by the [place] and has been authorized for detection and/or diagnosis of SARS-CoV-2 by FDA under an Emergency Use Authorization (EUA).  This EUA will remain in effect (meaning this test can be used) for the duration of the COVID-19 declaration under Section 564(b)(1) of the Act, 21 U.S.C. section 360bbb-3(b)(1), unless the authorization is terminated or revoked  sooner. Performed at [facility], [address] [place], [place] [address]   MRSA PCR Screening     Status: None   Collection Time: 01/13/20  1:27 AM   Specimen: Nasopharyngeal  Result Value Ref Range Status   MRSA by PCR NEGATIVE NEGATIVE Final    Comment:        The GeneXpert MRSA Assay (FDA approved for NASAL specimens only), is one component of a comprehensive MRSA colonization surveillance program. It is not intended to diagnose MRSA infection nor to guide or monitor treatment for MRSA infections. Performed at [facility], [place] [address], [place], [place] [address]      Labs: Basic Metabolic Panel: Recent Labs  Lab 01/12/20 1902 01/13/20 0705 01/17/20 0708 01/18/20 0505  NA 138 138 137 136  K 3.6 3.4* 3.3* 3.6  CL 101 102 100 102  CO2 13* 24 25 26   GLUCOSE 127* 141* 145* 161*  BUN 8 6 13 13   CREATININE 1.08 0.76 0.76 0.79  CALCIUM 9.1 9.0 8.3* 8.6*  MG  --  1.9 1.8  --   PHOS  --  2.9  --   --    Liver Function Tests: Recent Labs  Lab 01/12/20 1902 01/13/20 0705  AST 48* 33  ALT 23 21  ALKPHOS 70 51  BILITOT 0.6 1.3*  PROT 8.4* 6.8  ALBUMIN 4.2 3.5   No results for input(s): LIPASE, AMYLASE in the last 168 hours. No results for input(s): AMMONIA in the last 168 hours. CBC: Recent Labs  Lab 01/12/20 1902 01/13/20 0705  WBC 9.5 5.5  NEUTROABS 7.2 4.8  HGB 16.4 15.3  HCT 49.1 44.1  MCV 103.6* 98.7  PLT 161 143*   Cardiac Enzymes: No results for input(s): CKTOTAL, CKMB, CKMBINDEX, TROPONINI in the last 168 hours. BNP: BNP (last 3 results) No results for input(s): BNP in the last 8760 hours.  ProBNP (last 3 results) No results for input(s): PROBNP in the last 8760 hours.  CBG: Recent Labs  Lab 01/12/20 1901  GLUCAP 127*    Active Problems:   Seizure ([place])   Acute hypoxemic respiratory failure ([facility])   Acute respiratory insufficiency   Time coordinating discharge: 38 minutes.  Signed:        Joelene Barriere,  DO [facility]  01/18/2020, 4:39 PM

## 2020-01-18 NOTE — Progress Notes (Signed)
Patient ID: Frederick Drake, male   DOB: [birth year], 59 y.o.   MRN: [id number] BP 134/72 (BP Location: Left Arm)   Pulse (!) 59   Temp 97.6 F (36.4 C) (Oral)   Resp 12   Ht 6' (1.829 m)   Wt 101.2 kg   SpO2 94%   BMI 30.26 kg/m  Mr. Kjellberg, from a strictly neurosurgical perspective, does not need to be in the hospital in order to have a safe operative resection of his right frontal meningioma. His discharge however is not under my control, and his primary physician will exercise their judgement and medical expertise to make a final decision as to whether he will be discharged. I have included a decadron prescription in his discharge orders. My office will contact him with further instructions. I spoke to the family yesterday with regards to his discharge, and explained my rationale for his safely returning home. They understood, and I answered all of their questions.

## 2020-01-19 ENCOUNTER — Other Ambulatory Visit: Payer: Self-pay

## 2020-01-19 ENCOUNTER — Encounter (HOSPITAL_COMMUNITY): Payer: Self-pay | Admitting: Neurosurgery

## 2020-01-19 ENCOUNTER — Other Ambulatory Visit: Payer: Self-pay | Admitting: Neurosurgery

## 2020-01-19 NOTE — Anesthesia Preprocedure Evaluation (Addendum)
Anesthesia Evaluation  Patient identified by MRN, date of birth, ID band Patient awake    Reviewed: Allergy & Precautions, NPO status , Patient's Chart, lab work & pertinent test results  Airway Mallampati: III  TM Distance: >3 FB Neck ROM: Full    Dental  (+) Poor Dentition, Dental Advisory Given   Pulmonary Current Smoker,    breath sounds clear to auscultation       Cardiovascular hypertension, Pt. on medications  Rhythm:Regular Rate:Normal     Neuro/Psych  Headaches, Seizures -,  Brain tumor    GI/Hepatic negative GI ROS, Neg liver ROS,   Endo/Other  negative endocrine ROS  Renal/GU negative Renal ROS     Musculoskeletal   Abdominal   Peds  Hematology negative hematology ROS (+)   Anesthesia Other Findings   Reproductive/Obstetrics                             Lab Results  Component Value Date   WBC 5.5 01/13/2020   HGB 15.3 01/13/2020   HCT 44.1 01/13/2020   MCV 98.7 01/13/2020   PLT 143 (L) 01/13/2020   Lab Results  Component Value Date   CREATININE 0.79 01/18/2020   BUN 13 01/18/2020   NA 136 01/18/2020   K 3.6 01/18/2020   CL 102 01/18/2020   CO2 26 01/18/2020    Anesthesia Physical Anesthesia Plan  ASA: III  Anesthesia Plan: General   Post-op Pain Management:    Induction: Intravenous  PONV Risk Score and Plan: 1 and Dexamethasone, Ondansetron and Treatment may vary due to age or medical condition  Airway Management Planned: Oral ETT  Additional Equipment: Arterial line  Intra-op Plan:   Post-operative Plan: Possible Post-op intubation/ventilation and Extubation in OR  Informed Consent: I have reviewed the patients History and Physical, chart, labs and discussed the procedure including the risks, benefits and alternatives for the proposed anesthesia with the patient or authorized representative who has indicated his/her understanding and acceptance.      Dental advisory given  Plan Discussed with: CRNA  Anesthesia Plan Comments:        Anesthesia Quick Evaluation

## 2020-01-19 NOTE — Progress Notes (Addendum)
Mr. Iddings asked me to speak to his wife. I spoke with Mrs Rauseo reports that patient has not had any shortness of breath or chest pain.  Mrs Bargas reports that patient has not had a seizure since he was discharged home.  I instructed Mrs. Kulczyk that patient should not smoke cigarettes or marijuana between now and surgery.  Mrs Gurski said that will be hard for him. There is a FYI that has that Mr. Manley has a difficult airway, Mrs. Ewert is not aware.

## 2020-01-20 ENCOUNTER — Encounter (HOSPITAL_COMMUNITY)
Admission: RE | Disposition: A | Discharge status: Home / Self Care | Payer: Self-pay | Source: Home / Self Care | Attending: Neurosurgery

## 2020-01-20 ENCOUNTER — Inpatient Hospital Stay (HOSPITAL_COMMUNITY)
Admission: RE | Admit: 2020-01-20 | Discharge: 2020-01-22 | DRG: 025 | Disposition: A | Discharge status: Home / Self Care | Payer: Medicare Other | Attending: Neurosurgery | Admitting: Neurosurgery

## 2020-01-20 ENCOUNTER — Other Ambulatory Visit: Payer: Self-pay

## 2020-01-20 ENCOUNTER — Inpatient Hospital Stay (HOSPITAL_COMMUNITY): Payer: Medicare Other | Admitting: Certified Registered Nurse Anesthetist

## 2020-01-20 ENCOUNTER — Encounter (HOSPITAL_COMMUNITY): Payer: Self-pay | Admitting: Neurosurgery

## 2020-01-20 DIAGNOSIS — D320 Benign neoplasm of cerebral meninges: Secondary | ICD-10-CM | POA: Diagnosis present

## 2020-01-20 DIAGNOSIS — Z79899 Other long term (current) drug therapy: Secondary | ICD-10-CM

## 2020-01-20 DIAGNOSIS — Z20822 Contact with and (suspected) exposure to covid-19: Secondary | ICD-10-CM | POA: Diagnosis present

## 2020-01-20 DIAGNOSIS — D329 Benign neoplasm of meninges, unspecified: Secondary | ICD-10-CM | POA: Diagnosis present

## 2020-01-20 DIAGNOSIS — F17210 Nicotine dependence, cigarettes, uncomplicated: Secondary | ICD-10-CM | POA: Diagnosis present

## 2020-01-20 DIAGNOSIS — G936 Cerebral edema: Secondary | ICD-10-CM | POA: Diagnosis present

## 2020-01-20 DIAGNOSIS — Z9889 Other specified postprocedural states: Secondary | ICD-10-CM

## 2020-01-20 DIAGNOSIS — I10 Essential (primary) hypertension: Secondary | ICD-10-CM | POA: Diagnosis present

## 2020-01-20 HISTORY — DX: Depression, unspecified: F32.A

## 2020-01-20 HISTORY — PX: CRANIOTOMY: SHX93

## 2020-01-20 LAB — TYPE AND SCREEN
ABO/RH(D): O POS
Antibody Screen: NEGATIVE

## 2020-01-20 LAB — SARS CORONAVIRUS 2 BY RT PCR (HOSPITAL ORDER, PERFORMED IN ~~LOC~~ HOSPITAL LAB): SARS Coronavirus 2: NEGATIVE

## 2020-01-20 SURGERY — CRANIOTOMY TUMOR EXCISION
Anesthesia: General | Laterality: Right

## 2020-01-20 MED ORDER — LEVETIRACETAM 500 MG PO TABS
1000.0000 mg | ORAL_TABLET | Freq: Two times a day (BID) | ORAL | Status: DC
  Administered 2020-01-20 – 2020-01-22 (×4): 1000 mg via ORAL
  Filled 2020-01-20 (×4): qty 2

## 2020-01-20 MED ORDER — HEMOSTATIC AGENTS (NO CHARGE) OPTIME
TOPICAL | Status: DC | PRN
  Administered 2020-01-20: 1 via TOPICAL

## 2020-01-20 MED ORDER — ONDANSETRON HCL 4 MG/2ML IJ SOLN
INTRAMUSCULAR | Status: DC | PRN
  Administered 2020-01-20: 4 mg via INTRAVENOUS

## 2020-01-20 MED ORDER — FENTANYL CITRATE (PF) 100 MCG/2ML IJ SOLN
INTRAMUSCULAR | Status: AC
  Filled 2020-01-20: qty 2

## 2020-01-20 MED ORDER — SENNA 8.6 MG PO TABS
1.0000 | ORAL_TABLET | Freq: Two times a day (BID) | ORAL | Status: DC
  Administered 2020-01-20 – 2020-01-22 (×4): 8.6 mg via ORAL
  Filled 2020-01-20 (×4): qty 1

## 2020-01-20 MED ORDER — ONDANSETRON HCL 4 MG/2ML IJ SOLN
4.0000 mg | INTRAMUSCULAR | Status: DC | PRN
  Administered 2020-01-20: 4 mg via INTRAVENOUS
  Filled 2020-01-20: qty 2

## 2020-01-20 MED ORDER — VANCOMYCIN HCL IN DEXTROSE 1-5 GM/200ML-% IV SOLN
INTRAVENOUS | Status: AC
  Administered 2020-01-20: 1000 mg via INTRAVENOUS
  Filled 2020-01-20: qty 200

## 2020-01-20 MED ORDER — ONDANSETRON HCL 4 MG/2ML IJ SOLN: 4.0000 mg | Freq: Once | INTRAMUSCULAR | Status: DC | PRN

## 2020-01-20 MED ORDER — BISACODYL 5 MG PO TBEC: 5.0000 mg | DELAYED_RELEASE_TABLET | Freq: Every day | ORAL | Status: DC | PRN

## 2020-01-20 MED ORDER — LABETALOL HCL 5 MG/ML IV SOLN
10.0000 mg | INTRAVENOUS | Status: DC | PRN
  Administered 2020-01-20: 20 mg via INTRAVENOUS
  Filled 2020-01-20: qty 4

## 2020-01-20 MED ORDER — FENTANYL CITRATE (PF) 100 MCG/2ML IJ SOLN
25.0000 ug | INTRAMUSCULAR | Status: DC | PRN
  Administered 2020-01-20 (×3): 50 ug via INTRAVENOUS

## 2020-01-20 MED ORDER — LIDOCAINE-EPINEPHRINE 0.5 %-1:200000 IJ SOLN
INTRAMUSCULAR | Status: AC
  Filled 2020-01-20: qty 1

## 2020-01-20 MED ORDER — THROMBIN 5000 UNITS EX SOLR
OROMUCOSAL | Status: DC | PRN
  Administered 2020-01-20: 5 mL via TOPICAL

## 2020-01-20 MED ORDER — THROMBIN 20000 UNITS EX SOLR
CUTANEOUS | Status: AC
  Filled 2020-01-20: qty 20000

## 2020-01-20 MED ORDER — LIDOCAINE 2% (20 MG/ML) 5 ML SYRINGE
INTRAMUSCULAR | Status: AC
  Filled 2020-01-20: qty 5

## 2020-01-20 MED ORDER — NALOXONE HCL 0.4 MG/ML IJ SOLN: 0.0800 mg | INTRAMUSCULAR | Status: DC | PRN

## 2020-01-20 MED ORDER — THROMBIN 20000 UNITS EX SOLR
OROMUCOSAL | Status: DC | PRN
  Administered 2020-01-20: 20 mL via TOPICAL

## 2020-01-20 MED ORDER — ONDANSETRON HCL 4 MG/2ML IJ SOLN
INTRAMUSCULAR | Status: AC
  Filled 2020-01-20: qty 2

## 2020-01-20 MED ORDER — PROPOFOL 10 MG/ML IV BOLUS
INTRAVENOUS | Status: AC
  Filled 2020-01-20: qty 20

## 2020-01-20 MED ORDER — 0.9 % SODIUM CHLORIDE (POUR BTL) OPTIME
TOPICAL | Status: DC | PRN
  Administered 2020-01-20 (×3): 1000 mL

## 2020-01-20 MED ORDER — CHLORHEXIDINE GLUCONATE CLOTH 2 % EX PADS: 6.0000 | MEDICATED_PAD | Freq: Once | CUTANEOUS | Status: DC

## 2020-01-20 MED ORDER — BACITRACIN ZINC 500 UNIT/GM EX OINT
TOPICAL_OINTMENT | CUTANEOUS | Status: AC
  Filled 2020-01-20: qty 28.35

## 2020-01-20 MED ORDER — SODIUM CHLORIDE 0.9 % IV SOLN
INTRAVENOUS | Status: DC | PRN
  Administered 2020-01-20: 1000 mg via INTRAVENOUS

## 2020-01-20 MED ORDER — LABETALOL HCL 5 MG/ML IV SOLN
INTRAVENOUS | Status: AC
  Filled 2020-01-20: qty 4

## 2020-01-20 MED ORDER — LIDOCAINE 2% (20 MG/ML) 5 ML SYRINGE
INTRAMUSCULAR | Status: DC | PRN
  Administered 2020-01-20: 60 mg via INTRAVENOUS

## 2020-01-20 MED ORDER — LABETALOL HCL 5 MG/ML IV SOLN
INTRAVENOUS | Status: DC | PRN
Start: 2020-01-20 — End: 2020-01-20
  Administered 2020-01-20 (×3): 5 mg via INTRAVENOUS

## 2020-01-20 MED ORDER — DEXAMETHASONE SODIUM PHOSPHATE 4 MG/ML IJ SOLN
4.0000 mg | Freq: Four times a day (QID) | INTRAMUSCULAR | Status: DC
  Administered 2020-01-21 – 2020-01-22 (×3): 4 mg via INTRAVENOUS
  Filled 2020-01-20 (×3): qty 1

## 2020-01-20 MED ORDER — MAGNESIUM CITRATE PO SOLN: 1.0000 | Freq: Once | ORAL | Status: DC | PRN

## 2020-01-20 MED ORDER — PROMETHAZINE HCL 12.5 MG PO TABS
12.5000 mg | ORAL_TABLET | ORAL | Status: DC | PRN
  Filled 2020-01-20: qty 2

## 2020-01-20 MED ORDER — FENTANYL CITRATE (PF) 100 MCG/2ML IJ SOLN
INTRAMUSCULAR | Status: DC | PRN
  Administered 2020-01-20 (×3): 50 ug via INTRAVENOUS
  Administered 2020-01-20: 100 ug via INTRAVENOUS
  Administered 2020-01-20: 50 ug via INTRAVENOUS
  Administered 2020-01-20: 100 ug via INTRAVENOUS
  Administered 2020-01-20: 50 ug via INTRAVENOUS

## 2020-01-20 MED ORDER — FOLIC ACID 1 MG PO TABS
1.0000 mg | ORAL_TABLET | Freq: Every day | ORAL | Status: DC
  Administered 2020-01-20 – 2020-01-22 (×3): 1 mg via ORAL
  Filled 2020-01-20 (×3): qty 1

## 2020-01-20 MED ORDER — VANCOMYCIN HCL IN DEXTROSE 1-5 GM/200ML-% IV SOLN: 1000.0000 mg | INTRAVENOUS | Status: AC

## 2020-01-20 MED ORDER — PHENYLEPHRINE HCL-NACL 10-0.9 MG/250ML-% IV SOLN
INTRAVENOUS | Status: DC | PRN
  Administered 2020-01-20: 40 ug/min via INTRAVENOUS

## 2020-01-20 MED ORDER — DEXAMETHASONE SODIUM PHOSPHATE 10 MG/ML IJ SOLN
INTRAMUSCULAR | Status: DC | PRN
  Administered 2020-01-20 (×2): 10 mg via INTRAVENOUS

## 2020-01-20 MED ORDER — THROMBIN 5000 UNITS EX SOLR
CUTANEOUS | Status: AC
  Filled 2020-01-20: qty 5000

## 2020-01-20 MED ORDER — ACETAMINOPHEN 325 MG PO TABS
650.0000 mg | ORAL_TABLET | ORAL | Status: DC | PRN
  Administered 2020-01-20 – 2020-01-21 (×4): 650 mg via ORAL
  Filled 2020-01-20 (×4): qty 2

## 2020-01-20 MED ORDER — SODIUM CHLORIDE 0.9 % IV SOLN: INTRAVENOUS | Status: DC | PRN

## 2020-01-20 MED ORDER — ROCURONIUM BROMIDE 10 MG/ML (PF) SYRINGE
PREFILLED_SYRINGE | INTRAVENOUS | Status: AC
  Filled 2020-01-20: qty 20

## 2020-01-20 MED ORDER — MANNITOL 25 % IV SOLN
INTRAVENOUS | Status: DC | PRN
Start: 2020-01-20 — End: 2020-01-20
  Administered 2020-01-20: 37.5 g via INTRAVENOUS

## 2020-01-20 MED ORDER — PHENYLEPHRINE 40 MCG/ML (10ML) SYRINGE FOR IV PUSH (FOR BLOOD PRESSURE SUPPORT)
PREFILLED_SYRINGE | INTRAVENOUS | Status: AC
  Filled 2020-01-20: qty 10

## 2020-01-20 MED ORDER — SUGAMMADEX SODIUM 200 MG/2ML IV SOLN
INTRAVENOUS | Status: DC | PRN
  Administered 2020-01-20: 200 mg via INTRAVENOUS

## 2020-01-20 MED ORDER — MIDAZOLAM HCL 5 MG/5ML IJ SOLN
INTRAMUSCULAR | Status: DC | PRN
  Administered 2020-01-20: .5 mg via INTRAVENOUS

## 2020-01-20 MED ORDER — ONDANSETRON HCL 4 MG PO TABS: 4.0000 mg | ORAL_TABLET | ORAL | Status: DC | PRN

## 2020-01-20 MED ORDER — LACTATED RINGERS IV SOLN: INTRAVENOUS | Status: DC

## 2020-01-20 MED ORDER — GABAPENTIN 100 MG PO CAPS
100.0000 mg | ORAL_CAPSULE | Freq: Three times a day (TID) | ORAL | Status: DC
  Administered 2020-01-20 – 2020-01-22 (×6): 100 mg via ORAL
  Filled 2020-01-20 (×6): qty 1

## 2020-01-20 MED ORDER — LEVETIRACETAM IN NACL 500 MG/100ML IV SOLN: 500.0000 mg | Freq: Two times a day (BID) | INTRAVENOUS | Status: DC

## 2020-01-20 MED ORDER — ROCURONIUM BROMIDE 10 MG/ML (PF) SYRINGE
PREFILLED_SYRINGE | INTRAVENOUS | Status: DC | PRN
  Administered 2020-01-20: 10 mg via INTRAVENOUS
  Administered 2020-01-20: 30 mg via INTRAVENOUS
  Administered 2020-01-20: 10 mg via INTRAVENOUS
  Administered 2020-01-20: 60 mg via INTRAVENOUS
  Administered 2020-01-20: 10 mg via INTRAVENOUS
  Administered 2020-01-20: 40 mg via INTRAVENOUS
  Administered 2020-01-20: 10 mg via INTRAVENOUS

## 2020-01-20 MED ORDER — ADULT MULTIVITAMIN W/MINERALS CH
1.0000 | ORAL_TABLET | Freq: Every day | ORAL | Status: DC
  Administered 2020-01-20 – 2020-01-22 (×3): 1 via ORAL
  Filled 2020-01-20 (×3): qty 1

## 2020-01-20 MED ORDER — CHLORHEXIDINE GLUCONATE 0.12 % MT SOLN: 15.0000 mL | Freq: Once | OROMUCOSAL | Status: AC

## 2020-01-20 MED ORDER — CHLORHEXIDINE GLUCONATE CLOTH 2 % EX PADS
6.0000 | MEDICATED_PAD | Freq: Every day | CUTANEOUS | Status: DC
  Administered 2020-01-20 – 2020-01-21 (×2): 6 via TOPICAL

## 2020-01-20 MED ORDER — FENTANYL CITRATE (PF) 250 MCG/5ML IJ SOLN
INTRAMUSCULAR | Status: AC
  Filled 2020-01-20: qty 5

## 2020-01-20 MED ORDER — MANNITOL 25 % IV SOLN
37.5000 g | Freq: Once | Status: DC
  Filled 2020-01-20: qty 150

## 2020-01-20 MED ORDER — PROPOFOL 10 MG/ML IV BOLUS
INTRAVENOUS | Status: DC | PRN
  Administered 2020-01-20: 30 mg via INTRAVENOUS
  Administered 2020-01-20: 50 mg via INTRAVENOUS
  Administered 2020-01-20: 20 mg via INTRAVENOUS
  Administered 2020-01-20: 200 mg via INTRAVENOUS
  Administered 2020-01-20: 40 mg via INTRAVENOUS

## 2020-01-20 MED ORDER — BACITRACIN ZINC 500 UNIT/GM EX OINT
TOPICAL_OINTMENT | CUTANEOUS | Status: DC | PRN
  Administered 2020-01-20: 1 via TOPICAL

## 2020-01-20 MED ORDER — ACETAMINOPHEN 650 MG RE SUPP: 650.0000 mg | RECTAL | Status: DC | PRN

## 2020-01-20 MED ORDER — MORPHINE SULFATE (PF) 2 MG/ML IV SOLN
1.0000 mg | INTRAVENOUS | Status: DC | PRN
  Administered 2020-01-20 – 2020-01-22 (×9): 2 mg via INTRAVENOUS
  Filled 2020-01-20 (×9): qty 1

## 2020-01-20 MED ORDER — DEXAMETHASONE SODIUM PHOSPHATE 4 MG/ML IJ SOLN: 4.0000 mg | Freq: Three times a day (TID) | INTRAMUSCULAR | Status: DC

## 2020-01-20 MED ORDER — CLONIDINE HCL 0.1 MG PO TABS
0.2000 mg | ORAL_TABLET | Freq: Four times a day (QID) | ORAL | Status: DC
  Administered 2020-01-20 – 2020-01-22 (×7): 0.2 mg via ORAL
  Filled 2020-01-20 (×2): qty 2
  Filled 2020-01-20 (×3): qty 1
  Filled 2020-01-20: qty 2
  Filled 2020-01-20: qty 1

## 2020-01-20 MED ORDER — SENNOSIDES-DOCUSATE SODIUM 8.6-50 MG PO TABS: 1.0000 | ORAL_TABLET | Freq: Every evening | ORAL | Status: DC | PRN

## 2020-01-20 MED ORDER — LIDOCAINE-EPINEPHRINE 0.5 %-1:200000 IJ SOLN
INTRAMUSCULAR | Status: DC | PRN
  Administered 2020-01-20: 10 mL

## 2020-01-20 MED ORDER — PANTOPRAZOLE SODIUM 40 MG IV SOLR
40.0000 mg | Freq: Every day | INTRAVENOUS | Status: DC
  Administered 2020-01-20: 40 mg via INTRAVENOUS
  Filled 2020-01-20: qty 40

## 2020-01-20 MED ORDER — HYDROCODONE-ACETAMINOPHEN 5-325 MG PO TABS
1.0000 | ORAL_TABLET | ORAL | Status: DC | PRN
  Administered 2020-01-20 – 2020-01-22 (×6): 1 via ORAL
  Filled 2020-01-20 (×6): qty 1

## 2020-01-20 MED ORDER — LEVETIRACETAM IN NACL 1000 MG/100ML IV SOLN
1000.0000 mg | Freq: Once | INTRAVENOUS | Status: DC
  Filled 2020-01-20: qty 100

## 2020-01-20 MED ORDER — ATROPINE SULFATE 1 MG/10ML IJ SOSY
PREFILLED_SYRINGE | INTRAMUSCULAR | Status: AC
  Filled 2020-01-20: qty 10

## 2020-01-20 MED ORDER — GLYCOPYRROLATE 0.2 MG/ML IJ SOLN
INTRAMUSCULAR | Status: AC
  Filled 2020-01-20: qty 1

## 2020-01-20 MED ORDER — POTASSIUM CHLORIDE IN NACL 20-0.9 MEQ/L-% IV SOLN
INTRAVENOUS | Status: DC
  Filled 2020-01-20 (×3): qty 1000

## 2020-01-20 MED ORDER — ORAL CARE MOUTH RINSE: 15.0000 mL | Freq: Once | OROMUCOSAL | Status: AC

## 2020-01-20 MED ORDER — CHLORHEXIDINE GLUCONATE 0.12 % MT SOLN
OROMUCOSAL | Status: AC
  Administered 2020-01-20: 15 mL via OROMUCOSAL
  Filled 2020-01-20: qty 15

## 2020-01-20 MED ORDER — MIDAZOLAM HCL 2 MG/2ML IJ SOLN
INTRAMUSCULAR | Status: AC
  Filled 2020-01-20: qty 2

## 2020-01-20 MED ORDER — DEXAMETHASONE SODIUM PHOSPHATE 10 MG/ML IJ SOLN
6.0000 mg | Freq: Four times a day (QID) | INTRAMUSCULAR | Status: AC
  Administered 2020-01-20 – 2020-01-21 (×4): 6 mg via INTRAVENOUS
  Filled 2020-01-20 (×4): qty 1

## 2020-01-20 SURGICAL SUPPLY — 96 items
BAND RUBBER #18 3X1/16 STRL (MISCELLANEOUS) ×4 IMPLANT
BENZOIN TINCTURE PRP APPL 2/3 (GAUZE/BANDAGES/DRESSINGS) IMPLANT
BLADE CLIPPER SURG (BLADE) ×2 IMPLANT
BLADE SAW GIGLI 16 STRL (MISCELLANEOUS) IMPLANT
BLADE SURG 15 STRL LF DISP TIS (BLADE) IMPLANT
BLADE SURG 15 STRL SS (BLADE)
BLADE ULTRA TIP 2M (BLADE) IMPLANT
BNDG GAUZE ELAST 4 BULKY (GAUZE/BANDAGES/DRESSINGS) ×4 IMPLANT
BNDG STRETCH 4X75 NS LF (GAUZE/BANDAGES/DRESSINGS) ×2 IMPLANT
BUR ACORN 6.0 PRECISION (BURR) ×2 IMPLANT
BUR MATCHSTICK NEURO 3.0 LAGG (BURR) IMPLANT
BUR SPIRAL ROUTER 2.3 (BUR) ×2 IMPLANT
CANISTER SUCT 3000ML PPV (MISCELLANEOUS) ×2 IMPLANT
CARTRIDGE OIL MAESTRO DRILL (MISCELLANEOUS) ×1 IMPLANT
CATH VENTRIC 35X38 W/TROCAR LG (CATHETERS) IMPLANT
CLIP VESOCCLUDE MED 6/CT (CLIP) IMPLANT
CNTNR URN SCR LID CUP LEK RST (MISCELLANEOUS) ×1 IMPLANT
CONT SPEC 4OZ STRL OR WHT (MISCELLANEOUS) ×1
COVER WAND RF STERILE (DRAPES) ×2 IMPLANT
DECANTER SPIKE VIAL GLASS SM (MISCELLANEOUS) ×2 IMPLANT
DIFFUSER DRILL AIR PNEUMATIC (MISCELLANEOUS) ×2 IMPLANT
DRAIN SUBARACHNOID (WOUND CARE) IMPLANT
DRAPE CAMERA VIDEO/LASER (DRAPES) IMPLANT
DRAPE MICROSCOPE LEICA (MISCELLANEOUS) ×2 IMPLANT
DRAPE NEUROLOGICAL W/INCISE (DRAPES) ×2 IMPLANT
DRAPE ORTHO SPLIT 77X108 STRL (DRAPES)
DRAPE SURG 17X23 STRL (DRAPES) IMPLANT
DRAPE SURG ORHT 6 SPLT 77X108 (DRAPES) IMPLANT
DRAPE WARM FLUID 44X44 (DRAPES) ×2 IMPLANT
DURAPREP 6ML APPLICATOR 50/CS (WOUND CARE) ×2 IMPLANT
ELECT REM PT RETURN 9FT ADLT (ELECTROSURGICAL) ×2
ELECTRODE REM PT RTRN 9FT ADLT (ELECTROSURGICAL) ×1 IMPLANT
EVACUATOR 1/8 PVC DRAIN (DRAIN) IMPLANT
EVACUATOR SILICONE 100CC (DRAIN) IMPLANT
FORCEPS BIPOLAR MALIS 8X.5 DIS (ORTHOPEDIC DISPOSABLE SUPPLIES) ×2 IMPLANT
FORCEPS BIPOLAR SPETZLER 8 1.0 (NEUROSURGERY SUPPLIES) ×2 IMPLANT
GAUZE 4X4 16PLY RFD (DISPOSABLE) IMPLANT
GAUZE SPONGE 4X4 12PLY STRL (GAUZE/BANDAGES/DRESSINGS) ×2 IMPLANT
GLOVE BIO SURGEON STRL SZ7.5 (GLOVE) ×2 IMPLANT
GLOVE BIOGEL PI IND STRL 6.5 (GLOVE) ×3 IMPLANT
GLOVE BIOGEL PI INDICATOR 6.5 (GLOVE) ×3
GLOVE ECLIPSE 6.5 STRL STRAW (GLOVE) ×4 IMPLANT
GLOVE EXAM NITRILE XL STR (GLOVE) IMPLANT
GLOVE SURG SS PI 6.0 STRL IVOR (GLOVE) ×4 IMPLANT
GLOVE SURG SS PI 6.5 STRL IVOR (GLOVE) ×4 IMPLANT
GLOVE SURG SS PI 7.5 STRL IVOR (GLOVE) ×2 IMPLANT
GOWN STRL REUS W/ TWL LRG LVL3 (GOWN DISPOSABLE) ×4 IMPLANT
GOWN STRL REUS W/ TWL XL LVL3 (GOWN DISPOSABLE) ×1 IMPLANT
GOWN STRL REUS W/TWL 2XL LVL3 (GOWN DISPOSABLE) IMPLANT
GOWN STRL REUS W/TWL LRG LVL3 (GOWN DISPOSABLE) ×4
GOWN STRL REUS W/TWL XL LVL3 (GOWN DISPOSABLE) ×1
GRAFT DURAGEN MATRIX 3WX3L (Graft) ×1 IMPLANT
GRAFT DURAGEN MATRIX 3X3 SNGL (Graft) ×1 IMPLANT
HEMOSTAT POWDER SURGIFOAM 1G (HEMOSTASIS) ×2 IMPLANT
HEMOSTAT SURGICEL 2X14 (HEMOSTASIS) ×4 IMPLANT
HOOK DURA 1/2IN (MISCELLANEOUS) ×2 IMPLANT
KIT BASIN OR (CUSTOM PROCEDURE TRAY) ×2 IMPLANT
KIT DRAIN CSF ACCUDRAIN (MISCELLANEOUS) IMPLANT
KIT TURNOVER KIT B (KITS) ×2 IMPLANT
KNIFE ARACHNOID DISP AM-21-S (BLADE) ×2 IMPLANT
NEEDLE HYPO 25X1 1.5 SAFETY (NEEDLE) ×2 IMPLANT
NEEDLE SPNL 18GX3.5 QUINCKE PK (NEEDLE) IMPLANT
NS IRRIG 1000ML POUR BTL (IV SOLUTION) ×6 IMPLANT
OIL CARTRIDGE MAESTRO DRILL (MISCELLANEOUS) ×2
PACK BATTERY CMF DISP FOR DVR (ORTHOPEDIC DISPOSABLE SUPPLIES) ×2 IMPLANT
PACK CRANIOTOMY CUSTOM (CUSTOM PROCEDURE TRAY) ×2 IMPLANT
PATTIES SURGICAL .25X.25 (GAUZE/BANDAGES/DRESSINGS) IMPLANT
PATTIES SURGICAL .5 X.5 (GAUZE/BANDAGES/DRESSINGS) IMPLANT
PATTIES SURGICAL .5 X3 (DISPOSABLE) IMPLANT
PATTIES SURGICAL 1/4 X 3 (GAUZE/BANDAGES/DRESSINGS) IMPLANT
PATTIES SURGICAL 1X1 (DISPOSABLE) ×2 IMPLANT
PIN MAYFIELD SKULL DISP (PIN) ×2 IMPLANT
PLATE CMF SML 4H (Plate) ×6 IMPLANT
SCREW UNIII AXS SD 1.5X4 (Screw) ×24 IMPLANT
SET CARTRIDGE AND TUBING (SET/KITS/TRAYS/PACK) ×2 IMPLANT
SET TUBING IRRIGATION DISP (TUBING) ×2 IMPLANT
SPECIMEN JAR SMALL (MISCELLANEOUS) ×4 IMPLANT
SPONGE NEURO XRAY DETECT 1X3 (DISPOSABLE) ×4 IMPLANT
SPONGE SURGIFOAM ABS GEL 100 (HEMOSTASIS) ×2 IMPLANT
STAPLER VISISTAT 35W (STAPLE) ×4 IMPLANT
SUT ETHILON 3 0 FSL (SUTURE) IMPLANT
SUT ETHILON 3 0 PS 1 (SUTURE) IMPLANT
SUT NURALON 4 0 TR CR/8 (SUTURE) ×6 IMPLANT
SUT SILK 0 TIES 10X30 (SUTURE) IMPLANT
SUT STEEL 0 (SUTURE)
SUT STEEL 0 18XMFL TIE 17 (SUTURE) IMPLANT
SUT VIC AB 2-0 CT2 18 VCP726D (SUTURE) ×4 IMPLANT
TIP STANDARD 36KHZ (INSTRUMENTS) ×2
TIP STD 36KHZ (INSTRUMENTS) ×1 IMPLANT
TOWEL GREEN STERILE (TOWEL DISPOSABLE) ×2 IMPLANT
TOWEL GREEN STERILE FF (TOWEL DISPOSABLE) ×2 IMPLANT
TRAY FOLEY MTR SLVR 16FR STAT (SET/KITS/TRAYS/PACK) ×2 IMPLANT
TUBE CONNECTING 12X1/4 (SUCTIONS) ×2 IMPLANT
UNDERPAD 30X36 HEAVY ABSORB (UNDERPADS AND DIAPERS) ×2 IMPLANT
WATER STERILE IRR 1000ML POUR (IV SOLUTION) ×2 IMPLANT
WRENCH TORQUE 36KHZ (INSTRUMENTS) ×2 IMPLANT

## 2020-01-20 NOTE — Transfer of Care (Signed)
Immediate Anesthesia Transfer of Care Note  Patient: CORBIN FALCK  Procedure(s) Performed: Right frontal craniotomy for tumor resection (Right )  Patient Location: PACU  Anesthesia Type:General  Level of Consciousness: awake and alert   Airway & Oxygen Therapy: Patient Spontanous Breathing and Patient connected to face mask oxygen  Post-op Assessment: Report given to RN, Post -op Vital signs reviewed and stable, Patient moving all extremities X 4 and Patient able to stick tongue midline  Post vital signs: Reviewed and stable  Last Vitals:  Vitals Value Taken Time  BP 159/84 01/20/20 1320  Temp    Pulse 34 01/20/20 1330  Resp 35 01/20/20 1330  SpO2 92 % 01/20/20 1330  Vitals shown include unvalidated device data.  Last Pain:  Vitals:   01/20/20 0706  PainSc: 0-No pain         Complications: No apparent anesthesia complications

## 2020-01-20 NOTE — Anesthesia Postprocedure Evaluation (Signed)
Anesthesia Post Note  Patient: Frederick Drake  Procedure(s) Performed: Right frontal craniotomy for tumor resection (Right )     Patient location during evaluation: PACU Anesthesia Type: General Level of consciousness: awake and alert Pain management: pain level controlled Vital Signs Assessment: post-procedure vital signs reviewed and stable Respiratory status: spontaneous breathing, nonlabored ventilation, respiratory function stable and patient connected to nasal cannula oxygen Cardiovascular status: blood pressure returned to baseline and stable Postop Assessment: no apparent nausea or vomiting Anesthetic complications: no    Last Vitals:  Vitals:   01/20/20 1550 01/20/20 1620  BP: (!) 120/56   Pulse: (!) 49 (!) 53  Resp: (!) 21 14  Temp:  36.9 C  SpO2: 95%     Last Pain:  Vitals:   01/20/20 1620  TempSrc: Oral  PainSc:                  Tiajuana Amass

## 2020-01-20 NOTE — Interval H&P Note (Signed)
History and Physical Interval Note:  01/20/2020 7:34 AM  Frederick Drake  has presented today for surgery, with the diagnosis of Brain tumor.  The various methods of treatment have been discussed with the patient and family. After consideration of risks, benefits and other options for treatment, the patient has consented to  Procedure(s): Right frontal craniotomy for tumor resection (Right) as a surgical intervention.  The patient's history has been reviewed, patient examined, no change in status, stable for surgery.  I have reviewed the patient's chart and labs.  Questions were answered to the patient's satisfaction.     Ashok Pall

## 2020-01-20 NOTE — Op Note (Signed)
01/20/2020  1:44 PM  PATIENT:  Frederick Drake  59 y.o. male whom seized earlier this week secondary to significant edema in the right frontal lobe caused by a meningioma. He is admitted for tumor resection.  PRE-OPERATIVE DIAGNOSIS:  Brain tumor  POST-OPERATIVE DIAGNOSIS:  Brain tumor  PROCEDURE:  Procedure(s): Right frontal craniotomy for tumor resection  SURGEON: Surgeon(s): Ashok Pall, MD Judith Part, MD  ASSISTANTS:Ostergard, thomas  ANESTHESIA:   general  EBL:  Total I/O In: 1800 [I.V.:1700; IV Piggyback:100] Out: 5916 [Urine:1525; Blood:200]  BLOOD ADMINISTERED:none  CELL SAVER GIVEN:none  COUNT:per nursing  DRAINS: none   SPECIMEN:  Source of Specimen:  right frontal fossa  DICTATION: [name] was taken to the operating room, intubated, and placed under a general anesthetic without difficulty. A foley catheter was placed under sterile conditions. Once adequate anesthesia was obtained I placed a three pin Mayfield head holder on his head to 70lbs of pressure. I attached his head to the bed with a mayfield adapter. He was supine with his head turned tot he left.  His head was shaved, prepped and draped in a sterile manner. I made a pterional incision on the right side after infiltrating the planned incision with lidocaine. I used a 10 blade to open the scalp. I preserved the STA on the right side. I placed raney clips on the scalp edges. I developed the flap reflecting it rostrally. I made a small opening in the Temporalis muscle to create a burr hole. I then turned a skull flap with the craniotome to expose the frontal fossa. I opened the dura reflecting it rostrally. I immediately had tumor in view and then worked over the next few hours removing the mass. With dr. Colleen Can assistance I used cautery, the cusa and other tools to resect the tumor. We were able to dissect free the optic nerve, we crossed the midline to remove more tumor, and dissected free  the carotid artery. We achieved a gross total resection. Minor bleeding from the brain surface was controlled.  I closed by approximating the dura, replacing the skull flap with plates and screws, and finally the galea and scalp edges. I used duragen to completely cover the dural opening where I could not get good approximation of the dura.  I applied a sterile dressing. He was removed from the Mayfield and extubated.  PLAN OF CARE: Admit to inpatient   PATIENT DISPOSITION:  PACU - hemodynamically stable.   Delay start of Pharmacological VTE agent (>24hrs) due to surgical blood loss or risk of bleeding:  yes

## 2020-01-20 NOTE — Anesthesia Procedure Notes (Signed)
Procedure Name: Intubation Date/Time: 01/20/2020 7:55 AM Performed by: Inda Coke, CRNA Pre-anesthesia Checklist: Patient identified, Emergency Drugs available, Suction available and Patient being monitored Patient Re-evaluated:Patient Re-evaluated prior to induction Oxygen Delivery Method: Circle System Utilized Preoxygenation: Pre-oxygenation with 100% oxygen Induction Type: IV induction Ventilation: Mask ventilation without difficulty and Oral airway inserted - appropriate to patient size Laryngoscope Size: Mac and 4 Grade View: Grade I Tube type: Oral Tube size: 7.5 mm Number of attempts: 1 Airway Equipment and Method: Stylet and Oral airway Placement Confirmation: ETT inserted through vocal cords under direct vision,  positive ETCO2 and breath sounds checked- equal and bilateral Secured at: 22 cm Tube secured with: Tape Dental Injury: Teeth and Oropharynx as per pre-operative assessment

## 2020-01-20 NOTE — Anesthesia Procedure Notes (Signed)
Arterial Line Insertion Start/End6/11/2019 7:10 AM, 01/20/2020 7:15 AM Performed by: Renato Shin, CRNA, CRNA  Preanesthetic checklist: patient identified, IV checked, site marked, risks and benefits discussed, surgical consent, monitors and equipment checked, pre-op evaluation, timeout performed and anesthesia consent Lidocaine 1% used for infiltration Left, radial was placed Catheter size: 20 G Hand hygiene performed  and maximum sterile barriers used  Allen's test indicative of satisfactory collateral circulation Attempts: 1 Procedure performed without using ultrasound guided technique. Following insertion, dressing applied and Biopatch. Post procedure assessment: normal  Patient tolerated the procedure well with no immediate complications.

## 2020-01-20 NOTE — Plan of Care (Signed)
Pt alert and oriented x 4, calm, cooperative, moves all extremities equally bilaterally. Strong grips, dorsiflexion, plantar flexion. Complains of 10/10 headache, gave pain medication - see eMar.

## 2020-01-21 ENCOUNTER — Inpatient Hospital Stay (HOSPITAL_COMMUNITY): Payer: Medicare Other

## 2020-01-21 MED ORDER — PANTOPRAZOLE SODIUM 40 MG PO TBEC
40.0000 mg | DELAYED_RELEASE_TABLET | Freq: Every day | ORAL | Status: DC
  Administered 2020-01-21: 40 mg via ORAL
  Filled 2020-01-21: qty 1

## 2020-01-21 MED ORDER — NICOTINE 21 MG/24HR TD PT24
21.0000 mg | MEDICATED_PATCH | Freq: Every day | TRANSDERMAL | Status: DC
  Administered 2020-01-21 – 2020-01-22 (×3): 21 mg via TRANSDERMAL
  Filled 2020-01-21 (×3): qty 1

## 2020-01-21 MED ORDER — GADOBUTROL 1 MMOL/ML IV SOLN
10.0000 mL | Freq: Once | INTRAVENOUS | Status: AC | PRN
  Administered 2020-01-21: 10 mL via INTRAVENOUS

## 2020-01-21 NOTE — Progress Notes (Signed)
Pt arrived to 4NP07 in wheelchair from 4NICU. Pt is pleasant and A&Ox4.   Justice Rocher, RN

## 2020-01-22 MED ORDER — HYDROCODONE-ACETAMINOPHEN 5-325 MG PO TABS: 1.0000 | ORAL_TABLET | ORAL | 0 refills | Status: DC | PRN

## 2020-01-22 NOTE — Discharge Instructions (Signed)
Discharge Instructions  No new restriction in activities, slowly increase your activity back to normal.   Your incision is closed with staples. We will remove these in clinic in 2 weeks at your follow up appointment.   Okay to shower on the day of discharge. Use regular soap and water and try to be gentle when cleaning your incision. No need for a dressing on your incision. Okay to shower and wash your hair starting 6/7, if shampoo or regular soap stings when you are showering, use baby shamboo on your hair.   Follow up with Dr. Christella Noa in 2 weeks after discharge. If you do not already have a discharge appointment, please call his office at [phone] to schedule a follow up appointment. If you have any concerns or questions, please call the office and let us know.

## 2020-01-22 NOTE — Progress Notes (Signed)
Neurosurgery Service Progress Note  Subjective: No acute events overnight, no new complaints, feels his vision is subjectively stable to preop, but still not normal in the right nasal field   Objective: Vitals:   01/21/20 2005 01/22/20 0010 01/22/20 0011 01/22/20 0510  BP: 116/67 (!) 158/67 (!) 158/67 (!) 148/93  Pulse: (!) 59 (!) 56  (!) 59  Resp: 20 20  20   Temp: 98.2 F (36.8 C) 98.1 F (36.7 C)  98.2 F (36.8 C)  TempSrc: Oral Oral  Oral  SpO2: 100% 99%  98%  Weight:      Height:       Temp (24hrs), Avg:98.1 F (36.7 C), Min:97.7 F (36.5 C), Max:98.2 F (36.8 C)  CBC Latest Ref Rng & Units 01/13/2020 01/12/2020 03/05/2018  WBC 4.0 - 10.5 K/uL 5.5 9.5 5.2  Hemoglobin 13.0 - 17.0 g/dL 15.3 16.4 14.5  Hematocrit 39.0 - 52.0 % 44.1 49.1 43.5  Platelets 150 - 400 K/uL 143(L) 161 131(L)   BMP Latest Ref Rng & Units 01/18/2020 01/17/2020 01/13/2020  Glucose 70 - 99 mg/dL 161(H) 145(H) 141(H)  BUN 6 - 20 mg/dL 13 13 6   Creatinine 0.61 - 1.24 mg/dL 0.79 0.76 0.76  BUN/Creat Ratio 9 - 20 - - -  Sodium 135 - 145 mmol/L 136 137 138  Potassium 3.5 - 5.1 mmol/L 3.6 3.3(L) 3.4(L)  Chloride 98 - 111 mmol/L 102 100 102  CO2 22 - 32 mmol/L 26 25 24   Calcium 8.9 - 10.3 mg/dL 8.6(L) 8.3(L) 9.0    Intake/Output Summary (Last 24 hours) at 01/22/2020 0858 Last data filed at 01/22/2020 0800 Gross per 24 hour  Intake 159.97 ml  Output 3350 ml  Net -3190.03 ml    Current Facility-Administered Medications:  .  0.9 % NaCl with KCl 20 mEq/ L  infusion, , Intravenous, Continuous, Ashok Pall, MD, Last Rate: 80 mL/hr at 01/21/20 2036, New Bag at 01/21/20 2036 .  acetaminophen (TYLENOL) tablet 650 mg, 650 mg, Oral, Q4H PRN, 650 mg at 01/21/20 1028 **OR** acetaminophen (TYLENOL) suppository 650 mg, 650 mg, Rectal, Q4H PRN, Ashok Pall, MD .  bisacodyl (DULCOLAX) EC tablet 5 mg, 5 mg, Oral, Daily PRN, Ashok Pall, MD .  Chlorhexidine Gluconate Cloth 2 % PADS 6 each, 6 each, Topical, Daily,  Ashok Pall, MD, 6 each at 01/21/20 1446 .  cloNIDine (CATAPRES) tablet 0.2 mg, 0.2 mg, Oral, Q6H, Ashok Pall, MD, 0.2 mg at 01/22/20 0510 .  [COMPLETED] dexamethasone (DECADRON) injection 6 mg, 6 mg, Intravenous, Q6H, 6 mg at 01/21/20 1145 **FOLLOWED BY** dexamethasone (DECADRON) injection 4 mg, 4 mg, Intravenous, Q6H, 4 mg at 01/22/20 0510 **FOLLOWED BY** dexamethasone (DECADRON) injection 4 mg, 4 mg, Intravenous, Q8H, Cabbell, Kyle, MD .  folic acid (FOLVITE) tablet 1 mg, 1 mg, Oral, Daily, Ashok Pall, MD, 1 mg at 01/21/20 1028 .  gabapentin (NEURONTIN) capsule 100 mg, 100 mg, Oral, TID, Ashok Pall, MD, 100 mg at 01/21/20 2115 .  HYDROcodone-acetaminophen (NORCO/VICODIN) 5-325 MG per tablet 1 tablet, 1 tablet, Oral, Q4H PRN, Ashok Pall, MD, 1 tablet at 01/21/20 1546 .  labetalol (NORMODYNE) injection 10-40 mg, 10-40 mg, Intravenous, Q10 min PRN, Ashok Pall, MD, 20 mg at 01/20/20 1642 .  levETIRAcetam (KEPPRA) tablet 1,000 mg, 1,000 mg, Oral, Q12H, Ashok Pall, MD, 1,000 mg at 01/22/20 0321 .  magnesium citrate solution 1 Bottle, 1 Bottle, Oral, Once PRN, Ashok Pall, MD .  morphine 2 MG/ML injection 1-2 mg, 1-2 mg, Intravenous, Q2H PRN, Ashok Pall, MD,  2 mg at 01/22/20 0321 .  multivitamin with minerals tablet 1 tablet, 1 tablet, Oral, Daily, Ashok Pall, MD, 1 tablet at 01/21/20 1028 .  naloxone (NARCAN) injection 0.08 mg, 0.08 mg, Intravenous, PRN, Ashok Pall, MD .  nicotine (NICODERM CQ - dosed in mg/24 hours) patch 21 mg, 21 mg, Transdermal, Daily, Laurabelle Gorczyca, Joyice Faster, MD, 21 mg at 01/21/20 1031 .  ondansetron (ZOFRAN) tablet 4 mg, 4 mg, Oral, Q4H PRN **OR** ondansetron (ZOFRAN) injection 4 mg, 4 mg, Intravenous, Q4H PRN, Ashok Pall, MD, 4 mg at 01/20/20 1640 .  pantoprazole (PROTONIX) EC tablet 40 mg, 40 mg, Oral, Q supper, Alvira Philips, Arion, 40 mg at 01/21/20 1546 .  promethazine (PHENERGAN) tablet 12.5-25 mg, 12.5-25 mg, Oral, Q4H PRN, Ashok Pall,  MD .  senna (SENOKOT) tablet 8.6 mg, 1 tablet, Oral, BID, Ashok Pall, MD, 8.6 mg at 01/21/20 2115 .  senna-docusate (Senokot-S) tablet 1 tablet, 1 tablet, Oral, QHS PRN, Ashok Pall, MD   Physical Exam: AOx3, PERRL, EOMI, FS, Strength 5/5 x4, SILTx4, visual fields with decreased acuity in R nasal field but able to count fingers with right eye Incision c/d/i w/ staples, +R periorbital ecchymosis / edema  Assessment & Plan: 59 y.o. man s/p crani for tumor resection, MRI w/ expected residual along dura of skull base and expected post-op changes, recovering well.  -discharge home today  Judith Part  01/22/20 8:58 AM

## 2020-01-22 NOTE — Evaluation (Addendum)
Occupational Therapy Evaluation Patient Details Name: Frederick Drake MRN: [id number] DOB: Jan 29, 1961 Today's Date: 01/22/2020    History of Present Illness 59 yo male s/p R craniotomy for resection of Large skull base meningioma. [place] 2018 L temporal craniotomy for tumor; hernia repair; HTN, neuropathy bil Le, seizures smoker   Clinical Impression   Patient evaluated by Occupational Therapy with no further acute OT needs identified. All education has been completed and the patient has no further questions. See below for any follow-up Occupational Therapy or equipment needs. OT to sign off. Thank you for referral.   Pt educated on clean linens for all bathing. Pt educated on avoiding washing directly on cut and sponge bath until cleared to get incision wet via Dr orders. Pt able to recall all information provided. Pt advised to sit for bathing at this time due to unsteady balance with vision occlusion simulation during session. Pt agrees. Pt provided a urinal for night time use if needed to decr fall risk.   *noted discontinued order after seeing patient and reviewing chart. Documentation written as OT seeing pt prior to order discontinued.    Follow Up Recommendations  No OT follow up    Equipment Recommendations  None recommended by OT    Recommendations for Other Services       Precautions / Restrictions Precautions Precautions: Fall      Mobility Bed Mobility               General bed mobility comments: oob in chair on arrival  Transfers Overall transfer level: Needs assistance   Transfers: Sit to/from Stand Sit to Stand: Modified independent (Device/Increase time)              Balance                                           ADL either performed or assessed with clinical judgement   ADL Overall ADL's : Needs assistance/impaired Eating/Feeding: Modified independent   Grooming: Supervision/safety   Upper Body Bathing: Supervision/  safety   Lower Body Bathing: Supervison/ safety   Upper Body Dressing : Supervision/safety   Lower Body Dressing: Supervision/safety   Toilet Transfer: Supervision/safety           Functional mobility during ADLs: Supervision/safety       Vision Baseline Vision/History: Wears glasses Wears Glasses: Reading only(over the counter) Vision Assessment?: Yes Eye Alignment: Within Functional Limits Ocular Range of Motion: Within Functional Limits Additional Comments: pt with R eye lid edema. pt with R visual field peripheral deficits      Perception     Praxis      Pertinent Vitals/Pain Pain Assessment: 0-10 Pain Score: 8  Pain Location: head Pain Descriptors / Indicators: Operative site guarding;Headache(states like Migarine) Pain Intervention(s): Monitored during session;Premedicated before session;Patient requesting pain meds-RN notified     Hand Dominance Right   Extremity/Trunk Assessment Upper Extremity Assessment Upper Extremity Assessment: Overall WFL for tasks assessed   Lower Extremity Assessment Lower Extremity Assessment: Overall WFL for tasks assessed   Cervical / Trunk Assessment Cervical / Trunk Assessment: Normal   Communication Communication Communication: No difficulties   Cognition Arousal/Alertness: Awake/alert Behavior During Therapy: WFL for tasks assessed/performed Overall Cognitive Status: Within Functional Limits for tasks assessed  General Comments  incision dry     Exercises     Shoulder Instructions      Home Living Family/patient expects to be discharged to:: Private residence Living Arrangements: Spouse/significant other Available Help at Discharge: Family Type of Home: Mobile home Home Access: Stairs to enter Entrance Stairs-Number of Steps: 3 Entrance Stairs-Rails: Left Home Layout: One level     Bathroom Shower/Tub: Teacher, early years/pre: Standard      Home Equipment: Kasandra Knudsen - single point   Additional Comments: lives wife that can give 51 / 7 cause she does not work.       Prior Functioning/Environment Level of Independence: Independent        Comments: pt does not work now on disability         OT Problem List: Impaired balance (sitting and/or standing)      OT Treatment/Interventions:      OT Goals(Current goals can be found in the care plan section) Acute Rehab OT Goals Patient Stated Goal: to go home today Potential to Achieve Goals: Good  OT Frequency:     Barriers to D/C:            Co-evaluation              AM-PAC OT "6 Clicks" Daily Activity     Outcome Measure Help from another person eating meals?: None Help from another person taking care of personal grooming?: None Help from another person toileting, which includes using toliet, bedpan, or urinal?: None Help from another person bathing (including washing, rinsing, drying)?: A Little Help from another person to put on and taking off regular upper body clothing?: None Help from another person to put on and taking off regular lower body clothing?: None 6 Click Score: 23   End of Session Nurse Communication: Mobility status;Precautions  Activity Tolerance: Patient tolerated treatment well Patient left: in chair;with call bell/phone within reach(ending with session for PT to start)  OT Visit Diagnosis: Unsteadiness on feet (R26.81)                Time: 9169-4503 OT Time Calculation (min): 22 min Charges:  OT General Charges $OT Visit: 1 Visit OT Evaluation $OT Eval Moderate Complexity: 1 Mod   Brynn, OTR/L  Acute Rehabilitation Services Pager: [phone] Office: [phone] .   Jeri Modena 01/22/2020, 10:19 AM

## 2020-01-22 NOTE — Evaluation (Signed)
Physical Therapy Evaluation Patient Details Name: Frederick Drake MRN: [id number] DOB: Dec 10, 1960 Today's Date: 01/22/2020   History of Present Illness  59 y.o. male with PMH significant for L posterior temporal meningioma in 10/2016 no presents after having multiple seizures on 5/27. Head CT showing R frontal mass causing widespread edema in the R frontal lobe. Pt underwent craniotomy and resection of R frontal meningioma on 6/4. Other PMH includes carpal tunnel syndromw, HTN, and BLE neuropathy.  Clinical Impression  PT chart reviews pt prior to PT order cancellation by MD. Pt seen prior to acknowledgement of PT order cancellation. Pt presents to PT with deficits in high level balance and gait. Pt is able to mobilize well with use of cane, requiring supervision for dynamic gait tasks including changing gait speed, performing head turns, and stopping abruptly. PT provides education on having supervision for stair negotiation and limiting heat stress on body as pt reports enjoying sitting outside. Pt requires no further acute PT services at this time. PT recommending outpatient PT for high-level balance and gait tasks as pt does demonstrate increased sway and balance deviations with eyes closed.    Follow Up Recommendations Outpatient PT    Equipment Recommendations  None recommended by PT    Recommendations for Other Services       Precautions / Restrictions Precautions Precautions: Fall Restrictions Weight Bearing Restrictions: No      Mobility  Bed Mobility               General bed mobility comments: pt received in recliner  Transfers Overall transfer level: Independent   Transfers: Sit to/from Stand Sit to Stand: Independent            Ambulation/Gait Ambulation/Gait assistance: Supervision   Assistive device: Straight cane Gait Pattern/deviations: Step-through pattern Gait velocity: functional   General Gait Details: pt with slowed gait speed, no significant  balance deviations noted, able to perform head turns and change gait speed, stop abruptly and perform 360 degree turn  Stairs   Stairs assistance: Supervision Stair Management: One rail Left      Wheelchair Mobility    Modified Rankin (Stroke Patients Only)       Balance Overall balance assessment: Needs assistance Sitting-balance support: No upper extremity supported;Feet supported Sitting balance-Leahy Scale: Normal     Standing balance support: Single extremity supported Standing balance-Leahy Scale: Good Standing balance comment: eyes closed with UE support of cane close supervision, slight increase in lateral sway                             Pertinent Vitals/Pain Pain Assessment: 0-10 Pain Score: 8  Pain Location: head Pain Descriptors / Indicators: Aching Pain Intervention(s): Monitored during session    Home Living Family/patient expects to be discharged to:: Private residence Living Arrangements: Spouse/significant other Available Help at Discharge: Family Type of Home: Mobile home Home Access: Stairs to enter Entrance Stairs-Rails: Left Entrance Stairs-Number of Steps: 3 Home Layout: One level Home Equipment: Kasandra Knudsen - single point Additional Comments: lives wife that can give 75 / 7 cause she does not work.     Prior Function Level of Independence: Independent         Comments: pt does not work now on disability      Hand Dominance   Dominant Hand: Right    Extremity/Trunk Assessment   Upper Extremity Assessment Upper Extremity Assessment: Overall WFL for tasks assessed    Lower Extremity  Assessment Lower Extremity Assessment: Overall WFL for tasks assessed    Cervical / Trunk Assessment Cervical / Trunk Assessment: Normal  Communication   Communication: No difficulties  Cognition Arousal/Alertness: Awake/alert Behavior During Therapy: WFL for tasks assessed/performed Overall Cognitive Status: Within Functional Limits for  tasks assessed                                        General Comments General comments (skin integrity, edema, etc.): VSS on RA    Exercises     Assessment/Plan    PT Assessment All further PT needs can be met in the next venue of care  PT Problem List Decreased balance       PT Treatment Interventions      PT Goals (Current goals can be found in the Care Plan section)  Acute Rehab PT Goals Patient Stated Goal: to go home today    Frequency     Barriers to discharge        Co-evaluation               AM-PAC PT "6 Clicks" Mobility  Outcome Measure Help needed turning from your back to your side while in a flat bed without using bedrails?: None Help needed moving from lying on your back to sitting on the side of a flat bed without using bedrails?: None Help needed moving to and from a bed to a chair (including a wheelchair)?: None Help needed standing up from a chair using your arms (e.g., wheelchair or bedside chair)?: None Help needed to walk in hospital room?: None Help needed climbing 3-5 steps with a railing? : None 6 Click Score: 24    End of Session   Activity Tolerance: Patient tolerated treatment well Patient left: in chair;with call bell/phone within reach Nurse Communication: Mobility status PT Visit Diagnosis: Other abnormalities of gait and mobility (R26.89)    Time: 2119-4174 PT Time Calculation (min) (ACUTE ONLY): 13 min   Charges:   PT Evaluation $PT Eval Moderate Complexity: 1 Mod          Zenaida Niece, PT, DPT Acute Rehabilitation Pager: [phone]   Zenaida Niece 01/22/2020, 10:36 AM

## 2020-01-22 NOTE — Plan of Care (Signed)
  Problem: Education: Goal: Knowledge of the prescribed therapeutic regimen will improve Outcome: Progressing   Problem: Clinical Measurements: Goal: Usual level of consciousness will be regained or maintained. Outcome: Progressing Goal: Neurologic status will improve Outcome: Progressing Goal: Ability to maintain intracranial pressure will improve Outcome: Progressing   Problem: Skin Integrity: Goal: Demonstration of wound healing without infection will improve Outcome: Progressing   Problem: Education: Goal: Knowledge of General Education information will improve Description: Including pain rating scale, medication(s)/side effects and non-pharmacologic comfort measures Outcome: Progressing   Problem: Health Behavior/Discharge Planning: Goal: Ability to manage health-related needs will improve Outcome: Progressing   Problem: Clinical Measurements: Goal: Ability to maintain clinical measurements within normal limits will improve Outcome: Progressing Goal: Will remain free from infection Outcome: Progressing Goal: Diagnostic test results will improve Outcome: Progressing Goal: Respiratory complications will improve Outcome: Progressing Goal: Cardiovascular complication will be avoided Outcome: Progressing   Problem: Activity: Goal: Risk for activity intolerance will decrease Outcome: Progressing   Problem: Nutrition: Goal: Adequate nutrition will be maintained Outcome: Progressing   Problem: Coping: Goal: Level of anxiety will decrease Outcome: Progressing   Problem: Elimination: Goal: Will not experience complications related to bowel motility Outcome: Progressing Goal: Will not experience complications related to urinary retention Outcome: Progressing   Problem: Pain Managment: Goal: General experience of comfort will improve Outcome: Progressing   Problem: Safety: Goal: Ability to remain free from injury will improve Outcome: Progressing   Problem:  Skin Integrity: Goal: Risk for impaired skin integrity will decrease Outcome: Progressing   

## 2020-01-22 NOTE — Discharge Summary (Signed)
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 He was recovered in PACU and transferred to 4N. Post-op MRI showed good resection. His hospital course was uncomplicated and the patient was discharged home on 01/22/20. He will follow up in clinic with Dr. Christella Noa in 2 weeks.  Neurologic exam at discharge:  AOx3, PERRL, EOMI, FS, TM, +dec'd visual acuity in nasal field of R eye Strength 5/5 x4, SILTx4, no drift Incision c/d/i  Discharge diagnosis: Intracranial tumor  Judith Part, MD 01/22/20 9:02 AM

## 2020-01-23 ENCOUNTER — Encounter: Payer: Self-pay | Admitting: *Deleted

## 2020-01-23 LAB — SURGICAL PATHOLOGY

## 2020-02-17 ENCOUNTER — Other Ambulatory Visit: Payer: Self-pay

## 2020-07-17 ENCOUNTER — Other Ambulatory Visit: Payer: Self-pay

## 2020-07-17 ENCOUNTER — Encounter: Payer: Self-pay | Admitting: *Deleted

## 2020-07-17 ENCOUNTER — Emergency Department: Payer: Medicare Other

## 2020-07-17 DIAGNOSIS — R569 Unspecified convulsions: Secondary | ICD-10-CM | POA: Insufficient documentation

## 2020-07-17 DIAGNOSIS — F17210 Nicotine dependence, cigarettes, uncomplicated: Secondary | ICD-10-CM | POA: Diagnosis not present

## 2020-07-17 DIAGNOSIS — E119 Type 2 diabetes mellitus without complications: Secondary | ICD-10-CM | POA: Diagnosis not present

## 2020-07-17 DIAGNOSIS — I10 Essential (primary) hypertension: Secondary | ICD-10-CM | POA: Diagnosis not present

## 2020-07-17 DIAGNOSIS — Z7984 Long term (current) use of oral hypoglycemic drugs: Secondary | ICD-10-CM | POA: Diagnosis not present

## 2020-07-17 DIAGNOSIS — Z79899 Other long term (current) drug therapy: Secondary | ICD-10-CM | POA: Diagnosis not present

## 2020-07-17 LAB — CBC
HCT: 45.6 % (ref 39.0–52.0)
Hemoglobin: 15.4 g/dL (ref 13.0–17.0)
MCH: 32.8 pg (ref 26.0–34.0)
MCHC: 33.8 g/dL (ref 30.0–36.0)
MCV: 97 fL (ref 80.0–100.0)
Platelets: 97 10*3/uL — ABNORMAL LOW (ref 150–400)
RBC: 4.7 MIL/uL (ref 4.22–5.81)
RDW: 14.3 % (ref 11.5–15.5)
WBC: 4.8 10*3/uL (ref 4.0–10.5)
nRBC: 0 % (ref 0.0–0.2)

## 2020-07-17 LAB — BASIC METABOLIC PANEL
Anion gap: 11 (ref 5–15)
BUN: <5 mg/dL — ABNORMAL LOW (ref 6–20)
CO2: 29 mmol/L (ref 22–32)
Calcium: 8.4 mg/dL — ABNORMAL LOW (ref 8.9–10.3)
Chloride: 96 mmol/L — ABNORMAL LOW (ref 98–111)
Creatinine, Ser: 0.6 mg/dL — ABNORMAL LOW (ref 0.61–1.24)
GFR, Estimated: >60 mL/min (ref >60)
Glucose, Bld: 100 mg/dL — ABNORMAL HIGH (ref 70–99)
Potassium: 3.6 mmol/L (ref 3.5–5.1)
Sodium: 136 mmol/L (ref 135–145)

## 2020-07-17 NOTE — ED Triage Notes (Signed)
EMS brings pt in from home; reports hx seizures, came back from store per family and pt began "staring off, not responding" that lasted 61min

## 2020-07-17 NOTE — ED Triage Notes (Signed)
Pt brought in via ems for possible seizure.  Ems report on scene that pt was staring off into space.  Pt reports no headache.  Hx brain tumor.  Pt alert

## 2020-07-18 ENCOUNTER — Emergency Department
Admission: EM | Admit: 2020-07-18 | Discharge: 2020-07-18 | Disposition: A | Discharge status: Home / Self Care | Payer: Medicare Other | Attending: Emergency Medicine | Admitting: Emergency Medicine

## 2020-07-18 DIAGNOSIS — R569 Unspecified convulsions: Secondary | ICD-10-CM

## 2020-07-18 MED ORDER — LEVETIRACETAM 500 MG PO TABS
1000.0000 mg | ORAL_TABLET | Freq: Once | ORAL | Status: AC
  Administered 2020-07-18: 1000 mg via ORAL
  Filled 2020-07-18: qty 2

## 2020-07-18 MED ORDER — LEVETIRACETAM 1000 MG PO TABS
1000.0000 mg | ORAL_TABLET | Freq: Two times a day (BID) | ORAL | 0 refills | Status: DC
Start: 2020-07-18 — End: 2021-04-11

## 2020-07-18 NOTE — Discharge Instructions (Signed)
Take Keppra 1000mg  twice a day and follow up with neurology. Continue taking your other medications as well.

## 2020-07-18 NOTE — ED Provider Notes (Signed)
[facility] Emergency Department Provider Note  ____________________________________________  Time seen: Approximately 12:56 AM  I have reviewed the triage vital signs and the nursing notes.   HISTORY  Chief Complaint Seizures    HPI Frederick Drake is a 59 y.o. male with a history of hypertension, seizure disorder, brain tumor resection in June 2021 who comes ED complaining of a seizure characterized by a staring spell and being unresponsive for about 20 minutes. No convulsive activity witnessed. After waiting several hours to be seen, in the treatment room patient now states that he feels much better and back to normal. He recalls that he takes a blood pressure medicine and gabapentin, denies taking Keppra or any other seizure medicines.  Reviewed electronic medical records including his discharge summary from previous hospitalization in June 2021, clearly they intended for him to be taking Keppra.    Patient denies any vision changes, headache, neck stiffness or pain, motor weakness, paresthesia, or change in balance. He has chronic balance issues since brain surgery.  Past Medical History:  Diagnosis Date  . Allergy    Seasonal  . Brain tumor (benign) ([place])   . Carpal tunnel syndrome   . Carpal tunnel syndrome    bilateral  . Depression   . Headache   . Hypertension   . Neuropathy    Both legs  . Seizures [facility])      Patient Active Problem List   Diagnosis Date Noted  . Status post craniotomy 01/20/2020  . Seizure ([place]) 01/13/2020  . Acute hypoxemic respiratory failure ([place])   . Acute respiratory insufficiency   . Leg pain 07/23/2017  . Shoulder pain, left 07/23/2017  . Brain tumor ([place]) 11/06/2016  . Brain mass   . Acute respiratory failure ([place])   . Seizures ([place])   . Aphasia   . Meningioma ([place])   . Acute encephalopathy   . Status epilepticus ([place]) 10/16/2016  . 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 Hypertension 06/17/2011  .  Diabetes ([place]) 05/09/2010     Past Surgical History:  Procedure Laterality Date  . CRANIOTOMY Left 11/06/2016   Procedure: LEFT Temporal CRANIOTOMY FOR TUMOR;  Surgeon: Ashok Pall, MD;  Location: [place];  Service: Neurosurgery;  Laterality: Left;  LEFT Temproal CRANIOTOMY FOR TUMOR   . CRANIOTOMY Right 01/20/2020   Procedure: Right frontal craniotomy for tumor resection;  Surgeon: Ashok Pall, MD;  Location: [place];  Service: Neurosurgery;  Laterality: Right;  . HERNIA REPAIR     UMBICIAL  . VASECTOMY       Prior to Admission medications   Medication Sig Start Date End Date Taking? Authorizing Provider  cloNIDine (CATAPRES) 0.2 MG tablet Take 1 tablet (0.2 mg total) by mouth every 6 (six) hours. 01/18/20   Swayze, Ava, DO  folic acid (FOLVITE) 1 MG tablet Take 1 tablet (1 mg total) by mouth daily. 01/19/20   Swayze, Ava, DO  gabapentin (NEURONTIN) 100 MG capsule TAKE ONE CAPSULE BY MOUTH THREE TIMES DAILY Patient taking differently: Take 100 mg by mouth in the morning, at noon, and at bedtime.  06/04/19   Lavera Guise, MD  HYDROcodone-acetaminophen (NORCO/VICODIN) 5-325 MG tablet Take 1 tablet by mouth every 4 (four) hours as needed (pain). 01/22/20   Judith Part, MD  levETIRAcetam (KEPPRA) 1000 MG tablet Take 1 tablet (1,000 mg total) by mouth every 12 (twelve) hours. 01/18/20   Swayze, Ava, DO  lisinopril (ZESTRIL) 40 MG tablet TAKE ONE TABLET BY MOUTH ONCE  DAILY Patient not taking: No sig reported 06/21/19   Lavera Guise, MD  Multiple Vitamin (MULTIVITAMIN WITH MINERALS) TABS tablet Take 1 tablet by mouth daily. 01/19/20   Swayze, Ava, DO  nicotine (NICOTROL) 10 MG inhaler 6-16 catridges/day inhaled x 6-12 weeks, then taper dose over 6-12 week to discontinue smoking Patient taking differently: Inhale 1 continuous puffing into the lungs daily as needed for smoking cessation. 6-16 catridges/day inhaled x 6-12 weeks, then taper dose over 6-12 week to discontinue smoking 07/02/17   Zara Council A, PA-C  thiamine 100 MG tablet Take 1 tablet (100 mg total) by mouth daily. Patient not taking: Reported on 01/19/2020 01/19/20   Swayze, Ava, DO     Allergies Chantix [varenicline], Wasp venom, and Penicillins   Family History  Problem Relation Age of Onset  . Cancer Sister        Breast    Social History Social History   Tobacco Use  . Smoking status: Current Every Day Smoker    Packs/day: 2.00    Years: 43.00    Pack years: 86.00    Types: Cigarettes  . Smokeless tobacco: Never Used  Vaping Use  . Vaping Use: Never used  Substance Use Topics  . Alcohol use: Yes  . Drug use: Yes    Types: Marijuana    Comment: 01/19/2020- not currently    Review of Systems  Constitutional:   No fever or chills.  ENT:   No sore throat. No rhinorrhea. Cardiovascular:   No chest pain or syncope. Respiratory:   No dyspnea or cough. Gastrointestinal:   Negative for abdominal pain, vomiting and diarrhea.  Musculoskeletal:   Negative for focal pain or swelling All other systems reviewed and are negative except as documented above in ROS and HPI.  ____________________________________________   PHYSICAL EXAM:  VITAL SIGNS: ED Triage Vitals  Enc Vitals Group     BP 07/17/20 2040 (!) 145/89     Pulse Rate 07/17/20 2040 73     Resp 07/17/20 2040 18     Temp 07/17/20 2040 98.5 F (36.9 C)     Temp Source 07/17/20 2040 Oral     SpO2 07/17/20 2040 97 %     Weight 07/17/20 2041 195 lb (88.5 kg)     Height 07/17/20 2041 6' (1.829 m)     Head Circumference --      Peak Flow --      Pain Score 07/17/20 2044 0     Pain Loc --      Pain Edu? --      Excl. in [place]? --     Vital signs reviewed, nursing assessments reviewed.   Constitutional:   Alert and oriented. Non-toxic appearance. Eyes:   Conjunctivae are normal. EOMI. PERRL. ENT      Head:   Normocephalic and atraumatic.      Nose:   Wearing a mask.      Mouth/Throat:   Wearing a mask.      Neck:   No meningismus. Full  ROM. Hematological/Lymphatic/Immunilogical:   No cervical lymphadenopathy. Cardiovascular:   RRR. Symmetric bilateral radial and DP pulses.  No murmurs. Cap refill less than 2 seconds. Respiratory:   Normal respiratory effort without tachypnea/retractions. Breath sounds are clear and equal bilaterally. No wheezes/rales/rhonchi. Gastrointestinal:   Soft and nontender. Non distended. There is no CVA tenderness.  No rebound, rigidity, or guarding. Genitourinary:   deferred Musculoskeletal:   Normal range of motion in all extremities. No joint  effusions.  No lower extremity tenderness.  No edema. Neurologic:   Normal speech and language.  Cranial nerves II through XII intact Normal coordination. Motor grossly intact. No acute focal neurologic deficits are appreciated.  Skin:    Skin is warm, dry and intact. No rash noted.  No petechiae, purpura, or bullae.  ____________________________________________    LABS (pertinent positives/negatives) (all labs ordered are listed, but only abnormal results are displayed) Labs Reviewed  BASIC METABOLIC PANEL - Abnormal; Notable for the following components:      Result Value   Chloride 96 (*)    Glucose, Bld 100 (*)    BUN <5 (*)    Creatinine, Ser 0.60 (*)    Calcium 8.4 (*)    All other components within normal limits  CBC - Abnormal; Notable for the following components:   Platelets 97 (*)    All other components within normal limits   ____________________________________________   EKG    ____________________________________________    RADIOLOGY  CT Head Wo Contrast  Result Date: 07/17/2020 CLINICAL DATA:  Possible seizure, history of brain tumor EXAM: CT HEAD WITHOUT CONTRAST TECHNIQUE: Contiguous axial images were obtained from the base of the skull through the vertex without intravenous contrast. COMPARISON:  MRI 01/21/2020, CT 01/12/2020 FINDINGS: Brain: Encephalomalacia is seen in the right frontal lobe and left  temporoparietal region likely related to sites of prior resection. Assessment for recurrent or residual tissue is severely limited on this noncontrast CT examination. No new CT evident lesions. No acute evidence of infarct or hemorrhage. No extra-axial collection. Vascular: Atherosclerotic calcification of the carotid siphons. No hyperdense vessel. Skull: Postsurgical changes prior left temporoparietal and right frontal craniotomies without acute complication. Sinuses/Orbits: Stable calcifications seen in the left supraorbital and superior palpebral soft tissues. Included orbital structures are otherwise unremarkable. Paranasal sinuses and mastoid air cells are predominantly clear. Other: None. IMPRESSION: 1. No acute intracranial abnormality. 2. Encephalomalacia in the right frontal lobe and left temporoparietal region compatible with sites of prior mass resection. Assessment for recurrent or residual lesion is severely limited on this noncontrast CT examination. Would be better assessed with contrast enhanced MRI. 3. Additional postsurgical changes prior left temporoparietal and right frontal craniotomies without acute complication. 4. Stable calcifications in the left supraorbital and superior palpebral soft tissues. Electronically Signed   By: Lovena Le M.D.   On: 07/17/2020 21:14    ____________________________________________   PROCEDURES Procedures  ____________________________________________  DIFFERENTIAL DIAGNOSIS   Intracranial hemorrhage, seizure related to medication noncompliance, electrolyte abnormality, dehydration  CLINICAL IMPRESSION / ASSESSMENT AND PLAN / ED COURSE  Medications ordered in the ED: Medications  levETIRAcetam (KEPPRA) tablet 1,000 mg (1,000 mg Oral Given 07/18/20 0053)    Pertinent labs & imaging results that were available during my care of the patient were reviewed by me and considered in my medical decision making (see chart for details).  JERMANI EBERLEIN was evaluated in Emergency Department on 07/18/2020 for the symptoms described in the history of present illness. He was evaluated in the context of the global COVID-19 pandemic, which necessitated consideration that the patient might be at risk for infection with the SARS-CoV-2 virus that causes COVID-19. Institutional protocols and algorithms that pertain to the evaluation of patients at risk for COVID-19 are in a state of rapid change based on information released by regulatory bodies including the CDC and federal and state organizations. These policies and algorithms were followed during the patient's care in the ED.   Patient presents  with likely seizure at home. Vital signs unremarkable, exam is reassuring. CT scan is negative for ICH or other acute complications. Lab panel unremarkable. He is sitting upright, feels well, tolerating oral intake, stable for discharge home. I will give him a dose of Keppra here and write him a prescription.      ____________________________________________   FINAL CLINICAL IMPRESSION(S) / ED DIAGNOSES    Final diagnoses:  Seizure [facility])     ED Discharge Orders    None      Portions of this note were generated with dragon dictation software. Dictation errors may occur despite best attempts at proofreading.   Carrie Mew, MD 07/18/20 [phone]

## 2020-07-26 ENCOUNTER — Ambulatory Visit: Payer: Self-pay | Admitting: Internal Medicine

## 2020-07-30 ENCOUNTER — Telehealth: Payer: Self-pay

## 2020-07-30 NOTE — Telephone Encounter (Signed)
Called patient to rs missed ov on 07-26-20 patient stated they  Called to cancel appointment did not wish to rs at this time.

## 2020-08-23 DIAGNOSIS — Z6829 Body mass index (BMI) 29.0-29.9, adult: Secondary | ICD-10-CM | POA: Insufficient documentation

## 2020-10-26 ENCOUNTER — Other Ambulatory Visit: Payer: Self-pay | Admitting: Neurology

## 2020-11-30 ENCOUNTER — Other Ambulatory Visit: Payer: Disability Insurance

## 2020-12-12 ENCOUNTER — Other Ambulatory Visit: Payer: Self-pay

## 2020-12-14 ENCOUNTER — Other Ambulatory Visit: Payer: Self-pay

## 2020-12-14 MED FILL — Lamotrigine Tab 25 MG: ORAL | 30 days supply | Qty: 120 | Fill #0 | Status: AC

## 2020-12-20 DIAGNOSIS — R4701 Aphasia: Secondary | ICD-10-CM

## 2020-12-20 DIAGNOSIS — R0689 Other abnormalities of breathing: Secondary | ICD-10-CM

## 2020-12-21 ENCOUNTER — Other Ambulatory Visit: Payer: Self-pay

## 2020-12-21 MED ORDER — MELOXICAM 7.5 MG PO TABS
7.5000 mg | ORAL_TABLET | Freq: Every day | ORAL | 0 refills | Status: DC
Start: 2020-12-20 — End: 2021-04-11
  Filled 2020-12-21: qty 30, 30d supply, fill #0

## 2020-12-26 ENCOUNTER — Ambulatory Visit: Payer: Medicare Other | Admitting: Pharmacy Technician

## 2020-12-26 DIAGNOSIS — Z79899 Other long term (current) drug therapy: Secondary | ICD-10-CM

## 2020-12-27 ENCOUNTER — Other Ambulatory Visit: Payer: Self-pay

## 2020-12-27 NOTE — Progress Notes (Signed)
Completed [facility] application and contract.  Patient agreed to all terms of the [facility] contract.    Patient approved to receive medication assistance at [facility] until 08/18/21.  Should have Medicare Part D beginning 08/18/21.    Provided patient with Civil engineer, contracting based on his particular needs.    [place]

## 2020-12-28 ENCOUNTER — Other Ambulatory Visit: Payer: Self-pay

## 2020-12-28 MED ORDER — GABAPENTIN 300 MG PO CAPS
1200.0000 mg | ORAL_CAPSULE | Freq: Three times a day (TID) | ORAL | 10 refills | Status: DC
  Filled 2020-12-28: qty 120, 30d supply, fill #0
  Filled 2021-02-05: qty 120, 30d supply, fill #1

## 2021-01-03 ENCOUNTER — Telehealth: Payer: Self-pay | Admitting: *Deleted

## 2021-01-03 DIAGNOSIS — F17200 Nicotine dependence, unspecified, uncomplicated: Secondary | ICD-10-CM

## 2021-01-03 DIAGNOSIS — Z87891 Personal history of nicotine dependence: Secondary | ICD-10-CM

## 2021-01-03 DIAGNOSIS — Z122 Encounter for screening for malignant neoplasm of respiratory organs: Secondary | ICD-10-CM

## 2021-01-03 NOTE — Telephone Encounter (Signed)
Received referral for initial lung cancer screening scan. Contacted patient and obtained smoking history,(current, 86 pack year) as well as answering questions related to screening process. Patient denies signs of lung cancer such as weight loss or hemoptysis. Patient denies comorbidity that would prevent curative treatment if lung cancer were found. Patient is scheduled for shared decision making visit and CT scan on 02/13/21 at 1030am.

## 2021-01-04 ENCOUNTER — Other Ambulatory Visit: Payer: Self-pay

## 2021-01-07 ENCOUNTER — Encounter: Payer: Self-pay | Admitting: *Deleted

## 2021-01-15 ENCOUNTER — Other Ambulatory Visit: Payer: Self-pay

## 2021-01-21 ENCOUNTER — Other Ambulatory Visit: Payer: Self-pay

## 2021-01-21 MED FILL — Lamotrigine Tab 25 MG: ORAL | 30 days supply | Qty: 120 | Fill #1 | Status: AC

## 2021-01-22 ENCOUNTER — Other Ambulatory Visit: Payer: Self-pay

## 2021-02-05 ENCOUNTER — Other Ambulatory Visit: Payer: Self-pay

## 2021-02-06 ENCOUNTER — Other Ambulatory Visit: Payer: Self-pay

## 2021-02-08 ENCOUNTER — Other Ambulatory Visit: Payer: Self-pay

## 2021-02-08 MED ORDER — LAMOTRIGINE 100 MG PO TABS
100.0000 mg | ORAL_TABLET | Freq: Two times a day (BID) | ORAL | 5 refills | Status: DC
  Filled 2021-02-08: qty 60, 30d supply, fill #0

## 2021-02-12 ENCOUNTER — Other Ambulatory Visit (HOSPITAL_COMMUNITY): Payer: Self-pay | Admitting: Neurology

## 2021-02-12 ENCOUNTER — Inpatient Hospital Stay: Payer: Medicare Other | Attending: Nurse Practitioner | Admitting: Hospice and Palliative Medicine

## 2021-02-12 ENCOUNTER — Other Ambulatory Visit: Payer: Self-pay

## 2021-02-12 ENCOUNTER — Other Ambulatory Visit: Payer: Self-pay | Admitting: Neurology

## 2021-02-12 ENCOUNTER — Ambulatory Visit
Admission: RE | Admit: 2021-02-12 | Discharge: 2021-02-12 | Disposition: A | Discharge status: Home / Self Care | Payer: Medicare Other | Source: Ambulatory Visit | Attending: Oncology | Admitting: Oncology

## 2021-02-12 DIAGNOSIS — Z122 Encounter for screening for malignant neoplasm of respiratory organs: Secondary | ICD-10-CM

## 2021-02-12 DIAGNOSIS — F17200 Nicotine dependence, unspecified, uncomplicated: Secondary | ICD-10-CM | POA: Diagnosis present

## 2021-02-12 DIAGNOSIS — Z87891 Personal history of nicotine dependence: Secondary | ICD-10-CM

## 2021-02-12 DIAGNOSIS — R569 Unspecified convulsions: Secondary | ICD-10-CM

## 2021-02-12 NOTE — Progress Notes (Signed)
Virtual Visit via Telephone Note  I connected withNAME@ on 02/12/21 [email or web address] by telephone and verified that I am speaking with the correct person using two identifiers.   I discussed the limitations of evaluation and management by telemedicine and the availability of in person appointments. The patient expressed understanding and agreed to proceed.  Location: Patient: [facility] Provider: Clinic   In accordance with CMS guidelines, patient has met eligibility criteria including age, absence of signs or symptoms of lung cancer.  Social History   Tobacco Use   Smoking status: Every Day    Packs/day: 2.00    Years: 43.00    Pack years: 86.00    Types: Cigarettes   Smokeless tobacco: Never  Vaping Use   Vaping Use: Never used  Substance Use Topics   Alcohol use: Yes   Drug use: Yes    Types: Marijuana    Comment: 01/19/2020- not currently      A shared decision-making session was conducted prior to the performance of CT scan. This includes one or more decision aids, includes benefits and harms of screening, follow-up diagnostic testing, over-diagnosis, false positive rate, and total radiation exposure.   Counseling on the importance of adherence to annual lung cancer LDCT screening, impact of co-morbidities, and ability or willingness to undergo diagnosis and treatment is imperative for compliance of the program.   Counseling on the importance of continued smoking cessation for former smokers; the importance of smoking cessation for current smokers, and information about tobacco cessation interventions have been given to patient including [place] and 1800 quit [place] programs.   Written order for lung cancer screening with LDCT has been given to the patient and any and all questions have been answered to the best of my abilities.    Yearly follow up will be coordinated by Burgess Estelle, Thoracic Navigator.  Time Total: 5 minutes  Visit consisted of counseling and  education dealing with complex health screening. Greater than 50%  of this time was spent counseling and coordinating care related to the above assessment and plan.  Signed by: Altha Harm, PhD, NP-C

## 2021-02-13 ENCOUNTER — Telehealth: Payer: Disability Insurance | Admitting: Oncology

## 2021-02-13 ENCOUNTER — Ambulatory Visit: Payer: Disability Insurance

## 2021-02-14 ENCOUNTER — Telehealth: Payer: Self-pay | Admitting: *Deleted

## 2021-02-14 NOTE — Telephone Encounter (Signed)
Notified patient of LDCT lung cancer screening program results with recommendation for  follow up imaging. Dr. Lanney Gins recommends, "I would recommend respiratory cultures and antibitoics with repeat CT chest". Also notified of incidental findings noted below and is encouraged to discuss further with PCP who will receive a copy of this note and/or the CT report. Patient verbalizes understanding.  Will call and verbally notify Dr. Tyler Pita nurse of these results in addition to sending them via Epic.  IMPRESSION: 1. Lung-RADS 4Bs, suspicious. Additional imaging evaluation or consultation with Pulmonology or Thoracic Surgery recommended. Anterior left lower lobe, 3.8 cm, image 232/3.   2. Aortic Atherosclerosis (ICD10-I70.0) and Emphysema (ICD10-J43.9). 3. Coronary artery calcifications.

## 2021-02-19 ENCOUNTER — Other Ambulatory Visit: Payer: Self-pay

## 2021-02-22 ENCOUNTER — Ambulatory Visit: Payer: Medicare Other

## 2021-03-01 ENCOUNTER — Other Ambulatory Visit: Payer: Self-pay

## 2021-03-01 ENCOUNTER — Ambulatory Visit: Payer: Medicare Other | Admitting: Pharmacist

## 2021-03-01 DIAGNOSIS — Z79899 Other long term (current) drug therapy: Secondary | ICD-10-CM

## 2021-03-01 NOTE — Progress Notes (Addendum)
  [facility] Visit Note  Patient: Frederick Drake MRN: [id number] Date of Birth: Dec 25, 1960 PCP: Leonel Ramsay, MD   [name] 60 y.o. male presents for a medication therapy management visit today via telephone. Patient was verified by name and date of birth.  Patient now has Medicare A, B and D and is no longer eligible for assistance at [facility]. Patient was notified of the change in his eligibility status and prescriptions for gabapentin and lamotrigine were transferred to [facility], per patient's request.  [name], PharmD [facility] [place] Operations Coordinator [phone]

## 2021-03-04 ENCOUNTER — Other Ambulatory Visit: Payer: Self-pay

## 2021-03-04 MED ORDER — CLONIDINE HCL 0.2 MG PO TABS
ORAL_TABLET | ORAL | 7 refills | Status: DC
Start: 2021-03-04 — End: 2022-09-18

## 2021-03-05 ENCOUNTER — Other Ambulatory Visit: Payer: Self-pay

## 2021-03-05 ENCOUNTER — Ambulatory Visit
Admission: RE | Admit: 2021-03-05 | Discharge: 2021-03-05 | Disposition: A | Discharge status: Home / Self Care | Payer: Medicare Other | Source: Ambulatory Visit | Attending: Neurology | Admitting: Neurology

## 2021-03-05 ENCOUNTER — Telehealth: Payer: Self-pay | Admitting: Pharmacy Technician

## 2021-03-05 DIAGNOSIS — R569 Unspecified convulsions: Secondary | ICD-10-CM | POA: Diagnosis not present

## 2021-03-05 MED ORDER — GADOBUTROL 1 MMOL/ML IV SOLN
9.0000 mL | Freq: Once | INTRAVENOUS | Status: AC | PRN
  Administered 2021-03-05: 9 mL via INTRAVENOUS

## 2021-03-05 NOTE — Telephone Encounter (Signed)
Patient has Medicare Part D.  No longer meets [facility]'s eligibility criteria.  Patient notified by letter.    [place], [place]  [address]   March 05, 2021    Daeron Carreno [address] [place],   [address]  Dear Ebony Hail:  This is to inform you that you are no longer eligible to receive medication assistance at [facility].  The reason(s) are:    _____Your total gross monthly household income exceeds 250% of the Federal Poverty Level.   _____Tangible assets (savings, checking, stocks/bonds, pension, retirement, etc.) exceeds our limit  _____You are eligible to receive benefits from [facility], [facility] or HIV Medication            Assistance program __X__You are eligible to receive benefits from a Medicare Part "D" plan _____You have prescription insurance  _____You are not an [facility] resident _____Failure to provide all requested documentation (proof of income information for 2022., and/or Patient Intake Application, Delta, Contract, etc).    We regret that we are unable to help you at this time.  If your prescription coverage is terminated, please contact [facility], so that we may reassess your eligibility for our program.  If you have questions, we may be contacted at [phone].  Thank you,  [facility]

## 2021-03-06 ENCOUNTER — Other Ambulatory Visit: Payer: Self-pay

## 2021-04-10 ENCOUNTER — Emergency Department: Payer: Medicare Other

## 2021-04-10 ENCOUNTER — Observation Stay
Admission: EM | Admit: 2021-04-10 | Discharge: 2021-04-11 | Disposition: A | Discharge status: Home / Self Care | Payer: Medicare Other | Attending: Internal Medicine | Admitting: Internal Medicine

## 2021-04-10 DIAGNOSIS — R569 Unspecified convulsions: Secondary | ICD-10-CM

## 2021-04-10 DIAGNOSIS — G9341 Metabolic encephalopathy: Secondary | ICD-10-CM

## 2021-04-10 DIAGNOSIS — R410 Disorientation, unspecified: Secondary | ICD-10-CM | POA: Diagnosis not present

## 2021-04-10 DIAGNOSIS — Z20822 Contact with and (suspected) exposure to covid-19: Secondary | ICD-10-CM | POA: Diagnosis not present

## 2021-04-10 DIAGNOSIS — F17210 Nicotine dependence, cigarettes, uncomplicated: Secondary | ICD-10-CM | POA: Insufficient documentation

## 2021-04-10 DIAGNOSIS — Z79899 Other long term (current) drug therapy: Secondary | ICD-10-CM | POA: Diagnosis not present

## 2021-04-10 DIAGNOSIS — G40909 Epilepsy, unspecified, not intractable, without status epilepticus: Secondary | ICD-10-CM

## 2021-04-10 DIAGNOSIS — Y900 Blood alcohol level of less than 20 mg/100 ml: Secondary | ICD-10-CM | POA: Insufficient documentation

## 2021-04-10 DIAGNOSIS — M25559 Pain in unspecified hip: Secondary | ICD-10-CM | POA: Diagnosis not present

## 2021-04-10 DIAGNOSIS — E876 Hypokalemia: Secondary | ICD-10-CM | POA: Diagnosis not present

## 2021-04-10 DIAGNOSIS — E871 Hypo-osmolality and hyponatremia: Secondary | ICD-10-CM

## 2021-04-10 DIAGNOSIS — I10 Essential (primary) hypertension: Secondary | ICD-10-CM | POA: Diagnosis not present

## 2021-04-10 LAB — CBC WITH DIFFERENTIAL/PLATELET
Abs Immature Granulocytes: 0.03 10*3/uL (ref 0.00–0.07)
Basophils Absolute: 0 10*3/uL (ref 0.0–0.1)
Basophils Relative: 0 %
Eosinophils Absolute: 0 10*3/uL (ref 0.0–0.5)
Eosinophils Relative: 0 %
HCT: 42.6 % (ref 39.0–52.0)
Hemoglobin: 15.3 g/dL (ref 13.0–17.0)
Immature Granulocytes: 0 %
Lymphocytes Relative: 10 %
Lymphs Abs: 0.8 10*3/uL (ref 0.7–4.0)
MCH: 34.3 pg — ABNORMAL HIGH (ref 26.0–34.0)
MCHC: 35.9 g/dL (ref 30.0–36.0)
MCV: 95.5 fL (ref 80.0–100.0)
Monocytes Absolute: 0.6 10*3/uL (ref 0.1–1.0)
Monocytes Relative: 7 %
Neutro Abs: 6.5 10*3/uL (ref 1.7–7.7)
Neutrophils Relative %: 83 %
Platelets: 175 10*3/uL (ref 150–400)
RBC: 4.46 MIL/uL (ref 4.22–5.81)
RDW: 12.7 % (ref 11.5–15.5)
WBC: 7.9 10*3/uL (ref 4.0–10.5)
nRBC: 0 % (ref 0.0–0.2)

## 2021-04-10 LAB — COMPREHENSIVE METABOLIC PANEL
ALT: 20 U/L (ref 0–44)
AST: 50 U/L — ABNORMAL HIGH (ref 15–41)
Albumin: 3.6 g/dL (ref 3.5–5.0)
Alkaline Phosphatase: 94 U/L (ref 38–126)
Anion gap: 14 (ref 5–15)
BUN: 8 mg/dL (ref 6–20)
CO2: 25 mmol/L (ref 22–32)
Calcium: 8.6 mg/dL — ABNORMAL LOW (ref 8.9–10.3)
Chloride: 88 mmol/L — ABNORMAL LOW (ref 98–111)
Creatinine, Ser: 0.82 mg/dL (ref 0.61–1.24)
GFR, Estimated: >60 mL/min (ref >60)
Glucose, Bld: 111 mg/dL — ABNORMAL HIGH (ref 70–99)
Potassium: 3 mmol/L — ABNORMAL LOW (ref 3.5–5.1)
Sodium: 127 mmol/L — ABNORMAL LOW (ref 135–145)
Total Bilirubin: 1.4 mg/dL — ABNORMAL HIGH (ref 0.3–1.2)
Total Protein: 7 g/dL (ref 6.5–8.1)

## 2021-04-10 LAB — CBG MONITORING, ED: Glucose-Capillary: 113 mg/dL — ABNORMAL HIGH (ref 70–99)

## 2021-04-10 LAB — LIPASE, BLOOD: Lipase: 24 U/L (ref 11–51)

## 2021-04-10 LAB — ETHANOL: Alcohol, Ethyl (B): <10 mg/dL (ref <10)

## 2021-04-10 MED ORDER — LAMOTRIGINE 100 MG PO TABS
100.0000 mg | ORAL_TABLET | Freq: Once | ORAL | Status: AC
  Administered 2021-04-10: 100 mg via ORAL
  Filled 2021-04-10: qty 1

## 2021-04-10 MED ORDER — POTASSIUM CHLORIDE 20 MEQ PO PACK
40.0000 meq | PACK | Freq: Once | ORAL | Status: AC
  Administered 2021-04-10: 40 meq via ORAL
  Filled 2021-04-10: qty 2

## 2021-04-10 MED ORDER — SODIUM CHLORIDE 0.9 % IV BOLUS
1000.0000 mL | Freq: Once | INTRAVENOUS | Status: AC
  Administered 2021-04-10: 1000 mL via INTRAVENOUS

## 2021-04-10 NOTE — ED Triage Notes (Signed)
From home via EMS for seizures; witnessed by spouse for 2 minutes; last known seizure 2018; A/ O x 2 at this time; respiration even non-labored; skin warm dry

## 2021-04-11 ENCOUNTER — Inpatient Hospital Stay: Payer: Medicare Other

## 2021-04-11 ENCOUNTER — Encounter: Payer: Self-pay | Admitting: Family Medicine

## 2021-04-11 DIAGNOSIS — M25559 Pain in unspecified hip: Secondary | ICD-10-CM

## 2021-04-11 DIAGNOSIS — R569 Unspecified convulsions: Secondary | ICD-10-CM

## 2021-04-11 DIAGNOSIS — G40909 Epilepsy, unspecified, not intractable, without status epilepticus: Secondary | ICD-10-CM | POA: Diagnosis not present

## 2021-04-11 DIAGNOSIS — G9341 Metabolic encephalopathy: Secondary | ICD-10-CM | POA: Diagnosis not present

## 2021-04-11 LAB — BASIC METABOLIC PANEL
Anion gap: 9 (ref 5–15)
BUN: 6 mg/dL (ref 6–20)
CO2: 27 mmol/L (ref 22–32)
Calcium: 8.5 mg/dL — ABNORMAL LOW (ref 8.9–10.3)
Chloride: 96 mmol/L — ABNORMAL LOW (ref 98–111)
Creatinine, Ser: 0.74 mg/dL (ref 0.61–1.24)
GFR, Estimated: >60 mL/min (ref >60)
Glucose, Bld: 80 mg/dL (ref 70–99)
Potassium: 3.3 mmol/L — ABNORMAL LOW (ref 3.5–5.1)
Sodium: 132 mmol/L — ABNORMAL LOW (ref 135–145)

## 2021-04-11 LAB — URINE DRUG SCREEN, QUALITATIVE (ARMC ONLY)
Amphetamines, Ur Screen: NOT DETECTED
Barbiturates, Ur Screen: NOT DETECTED
Benzodiazepine, Ur Scrn: NOT DETECTED
Cannabinoid 50 Ng, Ur ~~LOC~~: POSITIVE — AB
Cocaine Metabolite,Ur ~~LOC~~: NOT DETECTED
MDMA (Ecstasy)Ur Screen: NOT DETECTED
Methadone Scn, Ur: NOT DETECTED
Opiate, Ur Screen: NOT DETECTED
Phencyclidine (PCP) Ur S: NOT DETECTED
Tricyclic, Ur Screen: NOT DETECTED

## 2021-04-11 LAB — CBC
HCT: 39.6 % (ref 39.0–52.0)
Hemoglobin: 14.2 g/dL (ref 13.0–17.0)
MCH: 35.1 pg — ABNORMAL HIGH (ref 26.0–34.0)
MCHC: 35.9 g/dL (ref 30.0–36.0)
MCV: 97.8 fL (ref 80.0–100.0)
Platelets: 138 10*3/uL — ABNORMAL LOW (ref 150–400)
RBC: 4.05 MIL/uL — ABNORMAL LOW (ref 4.22–5.81)
RDW: 12.9 % (ref 11.5–15.5)
WBC: 6.5 10*3/uL (ref 4.0–10.5)
nRBC: 0 % (ref 0.0–0.2)

## 2021-04-11 LAB — SARS CORONAVIRUS 2 (TAT 6-24 HRS): SARS Coronavirus 2: NEGATIVE

## 2021-04-11 LAB — MAGNESIUM: Magnesium: 1.7 mg/dL (ref 1.7–2.4)

## 2021-04-11 LAB — CBG MONITORING, ED
Glucose-Capillary: 69 mg/dL — ABNORMAL LOW (ref 70–99)
Glucose-Capillary: 80 mg/dL (ref 70–99)

## 2021-04-11 LAB — HIV ANTIBODY (ROUTINE TESTING W REFLEX): HIV Screen 4th Generation wRfx: NONREACTIVE

## 2021-04-11 MED ORDER — ACETAMINOPHEN 325 MG PO TABS: 650.0000 mg | ORAL_TABLET | Freq: Four times a day (QID) | ORAL | Status: DC | PRN

## 2021-04-11 MED ORDER — ACETAMINOPHEN 650 MG RE SUPP: 650.0000 mg | Freq: Four times a day (QID) | RECTAL | Status: DC | PRN

## 2021-04-11 MED ORDER — MELOXICAM 7.5 MG PO TABS
7.5000 mg | ORAL_TABLET | Freq: Every day | ORAL | Status: DC
  Administered 2021-04-11: 7.5 mg via ORAL
  Filled 2021-04-11: qty 1

## 2021-04-11 MED ORDER — GABAPENTIN 100 MG PO CAPS: 100.0000 mg | ORAL_CAPSULE | Freq: Every day | ORAL | Status: DC

## 2021-04-11 MED ORDER — DIVALPROEX SODIUM 500 MG PO DR TAB: 500.0000 mg | DELAYED_RELEASE_TABLET | Freq: Two times a day (BID) | ORAL | 1 refills | Status: DC

## 2021-04-11 MED ORDER — LEVETIRACETAM 500 MG PO TABS: 1000.0000 mg | ORAL_TABLET | Freq: Two times a day (BID) | ORAL | Status: DC

## 2021-04-11 MED ORDER — THIAMINE HCL 100 MG PO TABS
100.0000 mg | ORAL_TABLET | Freq: Every day | ORAL | Status: DC
  Administered 2021-04-11: 100 mg via ORAL
  Filled 2021-04-11: qty 1

## 2021-04-11 MED ORDER — SODIUM CHLORIDE 0.9 % IV SOLN: INTRAVENOUS | Status: DC

## 2021-04-11 MED ORDER — FOLIC ACID 1 MG PO TABS: 1.0000 mg | ORAL_TABLET | Freq: Every day | ORAL | Status: DC

## 2021-04-11 MED ORDER — DIVALPROEX SODIUM 500 MG PO DR TAB
500.0000 mg | DELAYED_RELEASE_TABLET | Freq: Two times a day (BID) | ORAL | Status: DC
  Filled 2021-04-11: qty 1

## 2021-04-11 MED ORDER — ADULT MULTIVITAMIN W/MINERALS CH: 1.0000 | ORAL_TABLET | Freq: Every day | ORAL | Status: DC

## 2021-04-11 MED ORDER — GABAPENTIN 100 MG PO CAPS: 100.0000 mg | ORAL_CAPSULE | Freq: Three times a day (TID) | ORAL | Status: DC

## 2021-04-11 MED ORDER — HYDROCODONE-ACETAMINOPHEN 5-325 MG PO TABS: 1.0000 | ORAL_TABLET | Freq: Four times a day (QID) | ORAL | Status: DC | PRN

## 2021-04-11 MED ORDER — GABAPENTIN 300 MG PO CAPS: 1200.0000 mg | ORAL_CAPSULE | Freq: Three times a day (TID) | ORAL | Status: DC

## 2021-04-11 MED ORDER — TRAZODONE HCL 50 MG PO TABS: 25.0000 mg | ORAL_TABLET | Freq: Every evening | ORAL | Status: DC | PRN

## 2021-04-11 MED ORDER — GABAPENTIN 300 MG PO CAPS: 300.0000 mg | ORAL_CAPSULE | ORAL | Status: DC

## 2021-04-11 MED ORDER — ENOXAPARIN SODIUM 40 MG/0.4ML IJ SOSY
40.0000 mg | PREFILLED_SYRINGE | INTRAMUSCULAR | Status: DC
  Administered 2021-04-11: 40 mg via SUBCUTANEOUS
  Filled 2021-04-11: qty 0.4

## 2021-04-11 MED ORDER — DIVALPROEX SODIUM 500 MG PO DR TAB: 500.0000 mg | DELAYED_RELEASE_TABLET | Freq: Two times a day (BID) | ORAL | Status: DC

## 2021-04-11 MED ORDER — LORAZEPAM 2 MG/ML IJ SOLN: 1.0000 mg | INTRAMUSCULAR | Status: DC | PRN

## 2021-04-11 MED ORDER — VALPROATE SODIUM 100 MG/ML IV SOLN
1500.0000 mg | Freq: Once | INTRAVENOUS | Status: AC
  Administered 2021-04-11: 1500 mg via INTRAVENOUS
  Filled 2021-04-11: qty 15

## 2021-04-11 MED ORDER — GABAPENTIN 300 MG PO CAPS
300.0000 mg | ORAL_CAPSULE | Freq: Two times a day (BID) | ORAL | Status: DC
  Administered 2021-04-11: 300 mg via ORAL
  Filled 2021-04-11: qty 1

## 2021-04-11 MED ORDER — CLONIDINE HCL 0.1 MG PO TABS
0.2000 mg | ORAL_TABLET | Freq: Three times a day (TID) | ORAL | Status: DC
  Administered 2021-04-11: 0.2 mg via ORAL
  Filled 2021-04-11: qty 2

## 2021-04-11 MED ORDER — FOLIC ACID 1 MG PO TABS
1.0000 mg | ORAL_TABLET | Freq: Every day | ORAL | Status: DC
  Administered 2021-04-11: 1 mg via ORAL
  Filled 2021-04-11: qty 1

## 2021-04-11 MED ORDER — ONDANSETRON HCL 4 MG/2ML IJ SOLN: 4.0000 mg | Freq: Four times a day (QID) | INTRAMUSCULAR | Status: DC | PRN

## 2021-04-11 MED ORDER — MAGNESIUM HYDROXIDE 400 MG/5ML PO SUSP: 30.0000 mL | Freq: Every day | ORAL | Status: DC | PRN

## 2021-04-11 MED ORDER — ONDANSETRON HCL 4 MG PO TABS: 4.0000 mg | ORAL_TABLET | Freq: Four times a day (QID) | ORAL | Status: DC | PRN

## 2021-04-11 MED ORDER — LAMOTRIGINE 100 MG PO TABS
100.0000 mg | ORAL_TABLET | Freq: Two times a day (BID) | ORAL | Status: DC
  Administered 2021-04-11: 100 mg via ORAL
  Filled 2021-04-11: qty 1

## 2021-04-11 MED ORDER — POTASSIUM CHLORIDE 20 MEQ PO PACK
40.0000 meq | PACK | Freq: Once | ORAL | Status: AC
  Administered 2021-04-11: 40 meq via ORAL
  Filled 2021-04-11: qty 2

## 2021-04-11 MED ORDER — GABAPENTIN 300 MG PO CAPS: 600.0000 mg | ORAL_CAPSULE | Freq: Every day | ORAL | Status: DC

## 2021-04-11 MED ORDER — ADULT MULTIVITAMIN W/MINERALS CH
1.0000 | ORAL_TABLET | Freq: Every day | ORAL | Status: DC
  Administered 2021-04-11: 1 via ORAL
  Filled 2021-04-11: qty 1

## 2021-04-11 MED ORDER — HYDROCODONE-ACETAMINOPHEN 5-325 MG PO TABS: 1.0000 | ORAL_TABLET | ORAL | Status: DC | PRN

## 2021-04-11 NOTE — ED Notes (Signed)
Pt refusing Covid swab, Dr. Sidney Ace made aware.

## 2021-04-11 NOTE — Care Management Obs Status (Signed)
[facility] NOTIFICATION   Patient Details  Name: Frederick Drake MRN: [id number] Date of Birth: Sep 06, 1960   Medicare Observation Status Notification Given:  Yes    Anselm Pancoast, RN 04/11/2021, 5:44 PM

## 2021-04-11 NOTE — Discharge Summary (Signed)
[facility] [place] NAME: Frederick Drake    MR#:  [id number]  DATE OF BIRTH:  1960/09/03  DATE OF ADMISSION:  04/10/2021 ADMITTING PHYSICIAN: Christel Mormon, MD  DATE OF DISCHARGE: 04/11/2021  PRIMARY CARE PHYSICIAN: Leonel Ramsay, MD    ADMISSION DIAGNOSIS:  Delirium [R41.0] Seizure ([place]) [R56.9] Seizures ([place]) [R56.9] Hip pain [M25.559]  DISCHARGE DIAGNOSIS:  Seizure DO  SECONDARY DIAGNOSIS:   Past Medical History:  Diagnosis Date   Allergy    Seasonal   Brain tumor (benign) ([place])    Carpal tunnel syndrome    Carpal tunnel syndrome    bilateral   Depression    Headache    Hypertension    Neuropathy    Both legs   Seizures [facility])     HOSPITAL COURSE:  Frederick Drake is a 60 y.o. Caucasian male with medical history significant for hypertension, depression, peripheral neuropathy, seizure disorder and previous history of meningioma, who presented to the ER with acute onset of altered mental status and suspected 2 seizures at home.   Seizure disorder. . - .e placed on seizures precautions. - continue his Lamictal and neurontin. --pt intolerant to Keppra - EEG This study s is consistent with patient's known history of epileptogenicity arising from left anterior temporal region.  Additionally there is cortical dysfunction left temporal region likely due to underlying structural abnormality.  Of note, this EEG pattern is also on the ictal-interictal continuum with low potential for seizures.  Additionally there is mild diffuse encephalopathy, nonspecific etiology.  No definite seizures were seen during the study. --pt seen by Dr Lowry Bowl on Depakote bid. No seizures in house noted --pt is adamant to go home. D/w wife about seizure precautions   Altered mental status with acute encephalopathy possibly with postictal prolonged phase. - mentation appears at baseline   Hyponatremia. - pt recieved hydrated with IV normal  saline  --Na 132  Essential hypertension. - continue clonidine  Smoking and ETOH use --pt advised cessation --no s/s withdrawal so far   CONSULTS OBTAINED:    DRUG ALLERGIES:   Allergies  Allergen Reactions   Keppra [Levetiracetam] Anxiety and Other (See Comments)    Severe mood changes   Chantix [Varenicline] Hives   Wasp Venom Swelling   Penicillins Rash    Has patient had a PCN reaction causing immediate rash, facial/tongue/throat swelling, SOB or lightheadedness with hypotension:YES Has patient had a PCN reaction causing severe rash involving mucus membranes or skin necrosis: NO Has patient had a PCN reaction that required hospitalization NO Has patient had a PCN reaction occurring within the last 10 years: NO If all of the above answers are "NO", then may proceed with Cephalosporin use.    DISCHARGE MEDICATIONS:   Allergies as of 04/11/2021       Reactions   Keppra [levetiracetam] Anxiety, Other (See Comments)   Severe mood changes   Chantix [varenicline] Hives   Wasp Venom Swelling   Penicillins Rash   Has patient had a PCN reaction causing immediate rash, facial/tongue/throat swelling, SOB or lightheadedness with hypotension:YES Has patient had a PCN reaction causing severe rash involving mucus membranes or skin necrosis: NO Has patient had a PCN reaction that required hospitalization NO Has patient had a PCN reaction occurring within the last 10 years: NO If all of the above answers are "NO", then may proceed with Cephalosporin use.        Medication List     STOP  taking these medications    folic acid 1 MG tablet Commonly known as: FOLVITE   levETIRAcetam 1000 MG tablet Commonly known as: KEPPRA   lisinopril 40 MG tablet Commonly known as: ZESTRIL   meloxicam 7.5 MG tablet Commonly known as: MOBIC       TAKE these medications    cloNIDine 0.2 MG tablet Commonly known as: CATAPRES Take 1 tablet (0.2 mg total) by mouth every 6 (six)  hours.   divalproex 500 MG DR tablet Commonly known as: DEPAKOTE Take 1 tablet (500 mg total) by mouth every 12 (twelve) hours.   gabapentin 100 MG capsule Commonly known as: NEURONTIN TAKE ONE CAPSULE BY MOUTH THREE TIMES DAILY   gabapentin 300 MG capsule Commonly known as: NEURONTIN Take one capsule by mouth every morning and evening and take 2 capsules at bedtime.   HYDROcodone-acetaminophen 5-325 MG tablet Commonly known as: NORCO/VICODIN Take 1 tablet by mouth every 4 (four) hours as needed (pain).   lamoTRIgine 100 MG tablet Commonly known as: LAMICTAL Take 1 tablet (100 mg total) by mouth 2 (two) times daily. What changed: Another medication with the same name was removed. Continue taking this medication, and follow the directions you see here.   multivitamin with minerals Tabs tablet Take 1 tablet by mouth daily.   nicotine 10 MG inhaler Commonly known as: NICOTROL 6-16 catridges/day inhaled x 6-12 weeks, then taper dose over 6-12 week to discontinue smoking What changed:  how much to take how to take this when to take this reasons to take this   thiamine 100 MG tablet Take 1 tablet (100 mg total) by mouth daily.        If you experience worsening of your admission symptoms, develop shortness of breath, life threatening emergency, suicidal or homicidal thoughts you must seek medical attention immediately by calling 911 or calling your MD immediately  if symptoms less severe.  You Must read complete instructions/literature along with all the possible adverse reactions/side effects for all the Medicines you take and that have been prescribed to you. Take any new Medicines after you have completely understood and accept all the possible adverse reactions/side effects.   Please note  You were cared for by a hospitalist during your hospital stay. If you have any questions about your discharge medications or the care you received while you were in the hospital after  you are discharged, you can call the unit and asked to speak with the hospitalist on call if the hospitalist that took care of you is not available. Once you are discharged, your primary care physician will handle any further medical issues. Please note that NO REFILLS for any discharge medications will be authorized once you are discharged, as it is imperative that you return to your primary care physician (or establish a relationship with a primary care physician if you do not have one) for your aftercare needs so that they can reassess your need for medications and monitor your lab values. Today   SUBJECTIVE   No vomiting or abdominal pain. Wife at bedside. Pt anxious to go home  VITAL SIGNS:  Blood pressure (!) 154/66, pulse (!) 56, temperature 98.4 F (36.9 C), resp. rate 20, height 6' (1.829 m), weight 89 kg, SpO2 98 %.  I/O:   Intake/Output Summary (Last 24 hours) at 04/11/2021 1721 Last data filed at 04/11/2021 1419 Gross per 24 hour  Intake 240 ml  Output 1300 ml  Net -1060 ml    PHYSICAL EXAMINATION:  GENERAL:  60 y.o.-year-old patient lying in the bed with no acute distress. disheveled LUNGS: Normal breath sounds bilaterally, no wheezing, rales,rhonchi or crepitation. No use of accessory muscles of respiration.  CARDIOVASCULAR: S1, S2 normal. No murmurs, rubs, or gallops.  ABDOMEN: Soft, non-tender, non-distended. Bowel sounds present. No organomegaly or mass.  EXTREMITIES: No pedal edema, cyanosis, or clubbing.  NEUROLOGIC: non focal PSYCHIATRIC: The patient is alert and awake SKIN: No obvious rash, lesion, or ulcer.   DATA REVIEW:   CBC  Recent Labs  Lab 04/11/21 0623  WBC 6.5  HGB 14.2  HCT 39.6  PLT 138*    Chemistries  Recent Labs  Lab 04/10/21 2024 04/11/21 0623  NA 127* 132*  K 3.0* 3.3*  CL 88* 96*  CO2 25 27  GLUCOSE 111* 80  BUN 8 6  CREATININE 0.82 0.74  CALCIUM 8.6* 8.5*  MG 1.7  --   AST 50*  --   ALT 20  --   ALKPHOS 94  --   BILITOT  1.4*  --     Microbiology Results   Recent Results (from the past 240 hour(s))  SARS CORONAVIRUS 2 (TAT 6-24 HRS) Nasopharyngeal Nasopharyngeal Swab     Status: None   Collection Time: 04/11/21  9:29 AM   Specimen: Nasopharyngeal Swab  Result Value Ref Range Status   SARS Coronavirus 2 NEGATIVE NEGATIVE Final    Comment: (NOTE) SARS-CoV-2 target nucleic acids are NOT DETECTED.  The SARS-CoV-2 RNA is generally detectable in upper and lower respiratory specimens during the acute phase of infection. Negative results do not preclude SARS-CoV-2 infection, do not rule out co-infections with other pathogens, and should not be used as the sole basis for treatment or other patient management decisions. Negative results must be combined with clinical observations, patient history, and epidemiological information. The expected result is Negative.  Fact Sheet for Patients: SugarRoll.be  Fact Sheet for Healthcare Providers: https://www.woods-mathews.com/  This test is not yet approved or cleared by the [place] FDA and  has been authorized for detection and/or diagnosis of SARS-CoV-2 by FDA under an Emergency Use Authorization (EUA). This EUA will remain  in effect (meaning this test can be used) for the duration of the COVID-19 declaration under Se ction 564(b)(1) of the Act, 21 U.S.C. section 360bbb-3(b)(1), unless the authorization is terminated or revoked sooner.  Performed at [facility], [place] [address], [place], [place] [address]     RADIOLOGY:  CT HEAD WO CONTRAST (5MM)  Result Date: 04/10/2021 CLINICAL DATA:  Mental status change, unknown cause EXAM: CT HEAD WITHOUT CONTRAST TECHNIQUE: Contiguous axial images were obtained from the base of the skull through the vertex without intravenous contrast. COMPARISON:  Brain MRI 03/05/2021, head CT 07/17/2020 FINDINGS: Brain: No acute hemorrhage. No evidence of acute ischemia. Again  seen right frontal encephalomalacia. Lesser encephalomalacia in the left temporal lobe. Previous MRI demonstrated residual meningioma which is not well appreciated by CT. CT appearance is stable from prior exam. No subdural or extra-axial collection. Stable ventricular size and morphology without hydrocephalus. Vascular: Skull base atherosclerosis without hyperdense vessel. Skull: Prior right frontal and left temporoparietal craniotomy. No fracture or acute findings. Sinuses/Orbits: No acute findings. Paranasal sinuses and mastoid air cells are clear. Other: None. IMPRESSION: 1. No acute intracranial abnormality. Stable CT appearance of the brain. 2. Stable encephalomalacia in the right frontal and left temporal lobes. Previous MRI demonstrated residual meningioma which is not well appreciated by CT. 3. Prior craniotomy. Electronically Signed   By:  Keith Rake M.D.   On: 04/10/2021 22:51   DG Chest Portable 1 View  Result Date: 04/10/2021 CLINICAL DATA:  Hypoxia EXAM: PORTABLE CHEST 1 VIEW COMPARISON:  01/12/2020 FINDINGS: Peribronchial thickening. No confluent opacities or effusions. Heart is normal size. IMPRESSION: Bronchitic changes. Electronically Signed   By: Rolm Baptise M.D.   On: 04/10/2021 22:16   EEG adult  Result Date: 04/11/2021 Lora Havens, MD     04/11/2021  3:50 PM Patient Name: BRANKO ALLOR MRN: [id number] Epilepsy Attending: Lora Havens Referring Physician/Provider: Dr Amie [place] Date: 04/11/2021 Duration: 25.11 mins Patient history: 60 year old man with past medical history of multiple meningioma status postresection via craniotomies as well as seizures, alcohol abuse presents for evaluation of concern for seizures-this time different than her as focal seizures with secondary generalization-this time was more staring spells. Level of alertness: Awake AEDs during EEG study: VPA, LTG, GBP Technical aspects: This EEG study was done with scalp electrodes positioned according  to the 10-20 International system of electrode placement. Electrical activity was acquired at a sampling rate of '500Hz'$  and reviewed with a high frequency filter of '70Hz'$  and a low frequency filter of '1Hz'$ . EEG data were recorded continuously and digitally stored. Description: The posterior dominant rhythm consists of 7.5 Hz activity of moderate voltage (25-35 uV) seen predominantly in posterior head regions, asymmetric ( L<R) and reactive to eye opening and eye closing. EEG showed continuous 3 to 5 Hz sharply contoured 3 to 5 Hz theta slowing in left temporal region which at times appears rhythmic. There is also intermittent generalized 3-5 theta-delta slowing. Sharp waves was noted in left anterior temporal region. Physiologic photic driving was not seen during photic stimulation.  Hyperventilation was not performed.   ABNORMALITY -Sharp wave, left anterior temporal region. -Continuous slow, left temporal region -Intermittent slow, generalized -Background asymmetry, left more than right IMPRESSION: This study s is consistent with patient's known history of epileptogenicity arising from left anterior temporal region.  Additionally there is cortical dysfunction left temporal region likely due to underlying structural abnormality.  Of note, this EEG pattern is also on the ictal-interictal continuum with low potential for seizures.  Additionally there is mild diffuse encephalopathy, nonspecific etiology.  No definite seizures were seen during the study. [place]   DG HIP UNILAT WITH PELVIS 2-3 VIEWS LEFT  Result Date: 04/11/2021 CLINICAL DATA:  60 year old male with left hip pain. EXAM: DG HIP (WITH OR WITHOUT PELVIS) 2-3V LEFT COMPARISON:  Left hip series 03/05/2017. FINDINGS: Femoral heads remain normally located. Bone mineralization remains normal. Hip joint spaces appear stable since 2018 and within normal limits for age. Mild chronic acetabular spurring. Proximal left femur is intact. No acute osseous  abnormality identified. SI joints appear within normal limits. There is bilateral iliofemoral calcified atherosclerosis. Partially visible chronic lower lumbar disc and endplate degeneration. IMPRESSION: No acute osseous abnormality identified. Mild for age hip joint degeneration appears stable since 2018. Electronically Signed   By: Genevie Ann M.D.   On: 04/11/2021 10:29     CODE STATUS:     Code Status Orders  (From admission, onward)           Start     Ordered   04/10/21 2359  Full code  Continuous        04/11/21 0000           Code Status History     Date Active Date Inactive Code Status Order ID Comments User Context  01/20/2020 1556 01/22/2020 1721 Full Code [id number]  Ashok Pall, MD Inpatient   01/13/2020 0216 01/18/2020 1938 Full Code [id number]  Corey Harold, NP Inpatient   11/06/2016 2144 11/08/2016 1716 Full Code [id number]  Ashok Pall, MD Inpatient   10/16/2016 2204 10/25/2016 1942 Full Code [id number]  Chesley Mires, MD Inpatient        TOTAL TIME TAKING CARE OF THIS PATIENT: 35 minutes.    Fritzi Mandes M.D  [facility]    CC: Primary care physician; Leonel Ramsay, MD

## 2021-04-11 NOTE — Progress Notes (Addendum)
Patient ID: Frederick Drake, male   DOB: 1961/06/09, 60 y.o.   MRN: [id number] patient seen. Wife in the room. Patient is very anxious and wants to leave to go home. No seizures reported. He is offered different kinds of food but does not want to eat anything right now. His main concerning is getting out of the hospital. Awaiting EEG.  seen by neurology. Recommends to continue present meds. If EEG remains unremarkable will likely discharge patient to home. Patient is very anxious and wants to go home.  5:08 pm--discussed results of EEG with [name]--pt is reluctant and wants to be discharged tonite. Wife tried to talk him into staying--pt admanant. I donot want him to go AMA since he need anti-seizure meds. Will d/c him with out pt f/u dr Manuella Ghazi. Neurology aware and ok with discharging pt home. Wife advised to bring back to ER if s/s recurr. Advised alochol and smoking cessaiton

## 2021-04-11 NOTE — ED Notes (Signed)
Informed RN bed assigned 

## 2021-04-11 NOTE — Consult Note (Signed)
Neurology Consultation  Reason for Consult: Seizures Referring Physician: Dr. Sidney Ace  CC: Seizures  History is obtained from: Chart, wife, patient  HPI: Frederick Drake is a 60 y.o. male past medical history of multiple meningiomas status postresection and encephalomalacia along with history of alcoholism with seizures that started in 2018.  Has had focal seizures with secondary generalization, was on Keppra and Lamictal but Keppra had to be discontinued due to agitated behavior. Also on gabapentin-mainly for neuropathy. Presented to the hospital yesterday when the partner was concerned that he was having frequent seizures-described that of staring spells and being fidgety.  This is not the kind of seizure activity has had in the past.  He was not responsive to voice or stimulation during those times.  These episodes lasted a few minutes.  No bowel bladder incontinence. Since 2 days ago, he had been very sick with vomiting.   Has partner reports that they drink a lot of alcohol together-they buy a 15 pack of beer and split them-so that makes it about 7-8 beers every day and his last alcohol intake was Monday. His wife reports that Keppra is listed on his med list but has been stopped because of agitation.  He is gotten into fights with people and has had been very angry while he was on Keppra for which reason it was discontinued. Patient also complains of ringing on both sides of his head, when asked him if he feels it in his years, he says its not in his ears but on both sides of his head.  He has reported this to his outpatient neurologist also-on Dr. Trena Platt outpatient note. Also has had history of frequent falls Has had prior status epilepticus requiring admission in 2018. Patient of Dr. Christella Noa who has been his neurosurgeon for meningioma resection.  ROS: Full ROS was performed and is negative except as noted in the HPI Past Medical History:  Diagnosis Date   Allergy    Seasonal   Brain  tumor (benign) ([name])    Carpal tunnel syndrome    Carpal tunnel syndrome    bilateral   Depression    Headache    Hypertension    Neuropathy    Both legs   Seizures (HCC)         Family History  Problem Relation Age of Onset   Cancer Sister        Breast     Social History:   reports that he has been smoking cigarettes. He has a 86.00 pack-year smoking history. He has never used smokeless tobacco. He reports current alcohol use. He reports current drug use. Drug: Marijuana.  Medications  Current Facility-Administered Medications:    0.9 %  sodium chloride infusion, , Intravenous, Continuous, Mansy, Jan A, MD, Last Rate: 100 mL/hr at 04/11/21 0753, New Bag at 04/11/21 0753   acetaminophen (TYLENOL) tablet 650 mg, 650 mg, Oral, Q6H PRN **OR** acetaminophen (TYLENOL) suppository 650 mg, 650 mg, Rectal, Q6H PRN, Mansy, Jan A, MD   cloNIDine (CATAPRES) tablet 0.2 mg, 0.2 mg, Oral, TID, Mansy, Jan A, MD   divalproex (DEPAKOTE) DR tablet 500 mg, 500 mg, Oral, Q12H, Amie Portland, MD   enoxaparin (LOVENOX) injection 40 mg, 40 mg, Subcutaneous, Q24H, Mansy, Jan A, MD, 40 mg at [id number]   folic acid (FOLVITE) tablet 1 mg, 1 mg, Oral, Daily, Mansy, Jan A, MD, 1 mg at 04/11/21 [id number]   gabapentin (NEURONTIN) capsule 100 mg, 100 mg, Oral, TID, Mansy, Arvella Merles, MD  gabapentin (NEURONTIN) capsule 300 mg, 300 mg, Oral, UD, Mansy, Jan A, MD   HYDROcodone-acetaminophen (NORCO/VICODIN) 5-325 MG per tablet 1 tablet, 1 tablet, Oral, Q4H PRN, Mansy, Jan A, MD   lamoTRIgine (LAMICTAL) tablet 100 mg, 100 mg, Oral, BID, Mansy, Jan A, MD   LORazepam (ATIVAN) injection 1 mg, 1 mg, Intravenous, Q1H PRN, Mansy, Jan A, MD   magnesium hydroxide (MILK OF MAGNESIA) suspension 30 mL, 30 mL, Oral, Daily PRN, Mansy, Jan A, MD   meloxicam [facility]) tablet 7.5 mg, 7.5 mg, Oral, Daily, Mansy, Jan A, MD   multivitamin with minerals tablet 1 tablet, 1 tablet, Oral, Daily, Mansy, Jan A, MD, 1 tablet at 04/11/21 0918    ondansetron (ZOFRAN) tablet 4 mg, 4 mg, Oral, Q6H PRN **OR** ondansetron (ZOFRAN) injection 4 mg, 4 mg, Intravenous, Q6H PRN, Mansy, Jan A, MD   thiamine tablet 100 mg, 100 mg, Oral, Daily, Mansy, Jan A, MD, 100 mg at 04/11/21 [id number]   traZODone (DESYREL) tablet 25 mg, 25 mg, Oral, QHS PRN, Mansy, Jan A, MD   valproate (DEPACON) 1,500 mg in dextrose 5 % 50 mL IVPB, 1,500 mg, Intravenous, Once, Amie Portland, MD  Current Outpatient Medications:    cloNIDine (CATAPRES) 0.2 MG tablet, Take 1 tablet (0.2 mg total) by mouth every 6 (six) hours., Disp: 60 tablet, Rfl: 7   folic acid (FOLVITE) 1 MG tablet, Take 1 tablet (1 mg total) by mouth daily., Disp: 30 tablet, Rfl: 0   gabapentin (NEURONTIN) 100 MG capsule, TAKE ONE CAPSULE BY MOUTH THREE TIMES DAILY (Patient taking differently: Take 100 mg by mouth in the morning, at noon, and at bedtime. ), Disp: 90 capsule, Rfl: 3   gabapentin (NEURONTIN) 300 MG capsule, Take one capsule by mouth every morning and evening and take 2 capsules at bedtime., Disp: 120 capsule, Rfl: 10   HYDROcodone-acetaminophen (NORCO/VICODIN) 5-325 MG tablet, Take 1 tablet by mouth every 4 (four) hours as needed (pain)., Disp: 30 tablet, Rfl: 0   lamoTRIgine (LAMICTAL) 100 MG tablet, Take 1 tablet (100 mg total) by mouth 2 (two) times daily., Disp: 60 tablet, Rfl: 5   lamoTRIgine (LAMICTAL) 25 MG tablet, TAKE ONE TABLET BY MOUTH AT NIGHT FOR 2 WEEKS. THEN TAKE 1 TABLET 2 TIMES A DAY FOR 2 WEEKS. THEN TAKE 2 TABLETS 2 TIMES A DAY, Disp: 120 tablet, Rfl: 11   levETIRAcetam (KEPPRA) 1000 MG tablet, Take 1 tablet (1,000 mg total) by mouth every 12 (twelve) hours., Disp: 60 tablet, Rfl: 0   lisinopril (ZESTRIL) 40 MG tablet, TAKE ONE TABLET BY MOUTH ONCE DAILY (Patient not taking: No sig reported), Disp: 10 tablet, Rfl: 0   meloxicam (MOBIC) 7.5 MG tablet, Take 1 tablet (7.5 mg total) by mouth daily., Disp: 30 tablet, Rfl: 0   Multiple Vitamin (MULTIVITAMIN WITH MINERALS) TABS tablet, Take  1 tablet by mouth daily., Disp: 30 tablet, Rfl: 0   nicotine (NICOTROL) 10 MG inhaler, 6-16 catridges/day inhaled x 6-12 weeks, then taper dose over 6-12 week to discontinue smoking (Patient taking differently: Inhale 1 continuous puffing into the lungs daily as needed for smoking cessation. 6-16 catridges/day inhaled x 6-12 weeks, then taper dose over 6-12 week to discontinue smoking), Disp: 42 each, Rfl: 0   thiamine 100 MG tablet, Take 1 tablet (100 mg total) by mouth daily. (Patient not taking: Reported on 01/19/2020), Disp: 30 tablet, Rfl: 0   Exam: Current vital signs: BP 124/67   Pulse (!) 50   Temp 98.4 F (36.9 C) (  Oral)   Resp 13   Ht 6' (1.829 m)   Wt 89 kg   SpO2 96%   BMI 26.61 kg/m  Vital signs in last 24 hours: Temp:  [98.4 F (36.9 C)-98.8 F (37.1 C)] 98.4 F (36.9 C) (08/25 0754) Pulse Rate:  [50-79] 50 (08/25 0800) Resp:  [13-23] 13 (08/25 0800) BP: (113-143)/(55-83) 124/67 (08/25 0800) SpO2:  [92 %-100 %] 96 % (08/25 0800) Weight:  [89 kg] 89 kg (08/24 2058) General: Awake alert no distress HEENT: Normocephalic/atraumatic Lungs: Clear  cardiovascular: Regular rate rhythm Abdomen nondistended nontender Extremities: Palpable tenderness on the left hip and decreased range of motion of the left hip Neurological exam Awake alert oriented to self Has some word finding difficulty-was able to name my thumb and pen correctly but when I asked him what is on my wrist, he [place]" instead of watch and later corrected and said it was a watch.  Repetition is intact but he is slow to respond to questions. Speech is mildly dysarthric He follows simple commands consistently and complex commands inconsistently Cranial nerves: Pupils equal round react light, extraocular movements intact with mild bilateral end gaze nystagmus, grossly symmetric face Motor exam: Both upper extremities antigravity and symmetric with nearly full strength.  Right lower extremity 5/5.  Left lower  extremity at the hip is limited by pain and palpable tenderness and decreased range of motion. Sensory exam: Diminished in a stocking pattern DTRs-mute Coordination: No obvious dysmetria but has resting and action tremors.  Labs I have reviewed labs in epic and the results pertinent to this consultation are:  CBC    Component Value Date/Time   WBC 6.5 04/11/2021 0623   RBC 4.05 (L) 04/11/2021 0623   HGB 14.2 04/11/2021 0623   HGB 14.5 03/05/2018 1204   HCT 39.6 04/11/2021 0623   HCT 43.5 03/05/2018 1204   PLT 138 (L) 04/11/2021 0623   PLT 131 (L) 03/05/2018 1204   MCV 97.8 04/11/2021 0623   MCV 99 (H) 03/05/2018 1204   MCV 98 03/26/2014 1400   MCH 35.1 (H) 04/11/2021 0623   MCHC 35.9 04/11/2021 0623   RDW 12.9 04/11/2021 0623   RDW 13.2 03/05/2018 1204   RDW 13.3 03/26/2014 1400   LYMPHSABS 0.8 04/10/2021 2024   LYMPHSABS 1.7 03/05/2018 1204   LYMPHSABS 1.5 03/26/2014 1400   MONOABS 0.6 04/10/2021 2024   MONOABS 0.5 03/26/2014 1400   EOSABS 0.0 04/10/2021 2024   EOSABS 0.2 03/05/2018 1204   EOSABS 0.2 03/26/2014 1400   BASOSABS 0.0 04/10/2021 2024   BASOSABS 0.0 03/05/2018 1204   BASOSABS 0.1 03/26/2014 1400    CMP     Component Value Date/Time   NA 132 (L) 04/11/2021 0623   NA 139 07/23/2017 1819   NA 140 03/26/2014 1400   K 3.3 (L) 04/11/2021 0623   K 3.8 03/26/2014 1400   CL 96 (L) 04/11/2021 0623   CL 105 03/26/2014 1400   CO2 27 04/11/2021 0623   CO2 26 03/26/2014 1400   GLUCOSE 80 04/11/2021 0623   GLUCOSE 87 03/26/2014 1400   BUN 6 04/11/2021 0623   BUN 9 07/23/2017 1819   BUN 5 (L) 03/26/2014 1400   CREATININE 0.74 04/11/2021 0623   CREATININE 0.90 03/26/2014 1400   CALCIUM 8.5 (L) 04/11/2021 0623   CALCIUM 8.3 (L) 03/26/2014 1400   PROT 7.0 04/10/2021 2024   PROT 7.4 07/02/2017 1947   PROT 7.4 03/26/2014 1400   ALBUMIN 3.6 04/10/2021 2024  ALBUMIN 4.4 07/02/2017 1947   ALBUMIN 3.0 (L) 03/26/2014 1400   AST 50 (H) 04/10/2021 2024   AST 64  (H) 03/26/2014 1400   ALT 20 04/10/2021 2024   ALT 42 03/26/2014 1400   ALKPHOS 94 04/10/2021 2024   ALKPHOS 55 03/26/2014 1400   BILITOT 1.4 (H) 04/10/2021 2024   BILITOT 0.3 07/02/2017 1947   BILITOT 0.3 03/26/2014 1400   GFRNONAA >60 04/11/2021 0623   GFRNONAA >60 03/26/2014 1400   GFRAA >60 01/18/2020 0505   GFRAA >60 03/26/2014 1400    Imaging I have reviewed the images obtained:  CT-head-no acute intracranial abnormality.  Stable CT appearance of the encephalomalacia in the right frontal left temporal lobes.  Previous MRI demonstrated residual management which is not well appreciated by CT.  Stigmata of prior craniotomy seen.   Assessment: 60 year old man with past medical history of multiple meningioma status postresection via craniotomies as well as seizures, alcohol abuse presents for evaluation of concern for seizures-this time different than her as focal seizures with secondary generalization-this time was more staring spells. Patient has not been drinking since Monday night-his last 1 was Monday night on his birthday and yesterday was noted to have these episodes. Could have had breakthrough seizures exacerbated by withdrawal as my unifying diagnosis.  Impression: Breakthrough seizure Alcohol withdrawal Left hip pain  Recommendations: Not taking Keppra due to agitation-recommend list as allergy. Continue lamotrigine 100 twice daily home dose Given concern for recent seizures which might have been due to lowered threshold from alcohol withdrawal or just breakthrough seizures, in the presence of severe behavioral issues, Depakote would be a good choice for him.  Given that he probably had seizures yesterday, an IV load is reasonable in spite of the known national shortage of IV Depakote. Load Depakote IV 1500 mg x 1 followed by 500 mg twice daily p.o. EEG Palpable tenderness on the left hip, history of falls-left hip x-ray. Management of alcohol withdrawal-CIWA  protocol-per primary team. Plan communicated to the hospitalist via secure chat I will follow the EEG with you.  -- Amie Portland, MD Neurologist [facility] Pager: [phone]

## 2021-04-11 NOTE — Progress Notes (Signed)
Neurology at bedside.

## 2021-04-11 NOTE — TOC Progression Note (Signed)
Transition of Care [facility]) - Progression Note    Patient Details  Name: Frederick Drake MRN: [id number] Date of Birth: Dec 11, 1960  [facility] CM/SW Contact  Anselm Pancoast, RN Phone Number: 04/11/2021, 5:50 PM  Clinical Narrative:    Called into room and spoke with spouse-confirmed code 44 and change to OBS. Spouse with no needs or concerns and eager to discharge.         Expected Discharge Plan and Services           Expected Discharge Date: 04/11/21                                     Social Determinants of Health (SDOH) Interventions    Readmission Risk Interventions No flowsheet data found.

## 2021-04-11 NOTE — H&P (Signed)
[name]   PATIENT NAME: Frederick Drake    MR#:  [id number]  DATE OF BIRTH:  10-12-1960  DATE OF ADMISSION:  04/10/2021  PRIMARY CARE PHYSICIAN: Leonel Ramsay, MD   Patient is coming from: Home  REQUESTING/REFERRING PHYSICIAN: Rada Hay, MD  CHIEF COMPLAINT:  No chief complaint on file.   HISTORY OF PRESENT ILLNESS:  Frederick Drake is a 60 y.o. Caucasian male with medical history significant for hypertension, depression, peripheral neuropathy, seizure disorder and previous history of meningioma, who presented to the ER with acute onset of altered mental status and suspected 2 seizures at home.  He has been mainly staring during seizures today which is unusual for him.  No alcohol intake during the last couple of days.  He has been postictal with persistent confusion.  No reported tonic or clonic seizures or shaking.  He denied any headache or dizziness or blurred vision.  No paresthesias or focal muscle weakness.  No nausea or vomiting or diarrhea or abdominal pain.  No fever or chills.  He has been having occasional foot pain.  No dysuria, oliguria or hematuria or flank pain.   ED Course: When he came to the ER vital signs were within normal.  Was revealed hyponatremia and hypochloremia with AST 50 and total bili of 1.4 with otherwise unremarkable CMP.  CBC was within normal.  Alkalosis-.  EKG as reviewed by me : showed normal sinus rhythm with rate of 83 with abnormal R wave progression.  It showed prolonged QT interval with QTC of 519 MS. Imaging: Noncontrast head CT scan revealed: 1. No acute intracranial abnormality. Stable CT appearance of the brain. 2. Stable encephalomalacia in the right frontal and left temporal lobes. Previous MRI demonstrated residual meningioma which is not well appreciated by CT. 3. Prior craniotomy Portable chest ray showed bronchitic changes.  The patient was given 100 g of p.o. Lamictal, 40 mill [place] p.o. potassium  chloride and 1 L bolus of IV normal saline.  He will be admitted to a progressive unit bed for further evaluation and management.  PAST MEDICAL HISTORY:   Past Medical History:  Diagnosis Date   Allergy    Seasonal   Brain tumor (benign) ([place])    Carpal tunnel syndrome    Carpal tunnel syndrome    bilateral   Depression    Headache    Hypertension    Neuropathy    Both legs   Seizures ([place])     PAST SURGICAL HISTORY:   Past Surgical History:  Procedure Laterality Date   CRANIOTOMY Left 11/06/2016   Procedure: LEFT Temporal CRANIOTOMY FOR TUMOR;  Surgeon: Ashok Pall, MD;  Location: [place];  Service: Neurosurgery;  Laterality: Left;  LEFT Temproal CRANIOTOMY FOR TUMOR    CRANIOTOMY Right 01/20/2020   Procedure: Right frontal craniotomy for tumor resection;  Surgeon: Ashok Pall, MD;  Location: [place];  Service: Neurosurgery;  Laterality: Right;   HERNIA REPAIR     UMBICIAL   VASECTOMY      SOCIAL HISTORY:   Social History   Tobacco Use   Smoking status: Every Day    Packs/day: 2.00    Years: 43.00    Pack years: 86.00    Types: Cigarettes   Smokeless tobacco: Never  Substance Use Topics   Alcohol use: Yes    FAMILY HISTORY:   Family History  Problem Relation Age of Onset   Cancer Sister  Breast    DRUG ALLERGIES:   Allergies  Allergen Reactions   Chantix [Varenicline] Hives   Wasp Venom Swelling   Penicillins Rash    Has patient had a PCN reaction causing immediate rash, facial/tongue/throat swelling, SOB or lightheadedness with hypotension:YES Has patient had a PCN reaction causing severe rash involving mucus membranes or skin necrosis: NO Has patient had a PCN reaction that required hospitalization NO Has patient had a PCN reaction occurring within the last 10 years: NO If all of the above answers are "NO", then may proceed with Cephalosporin use.    REVIEW OF SYSTEMS:   ROS As per history of present illness. All pertinent systems were  reviewed above. Constitutional, HEENT, cardiovascular, respiratory, GI, GU, musculoskeletal, neuro, psychiatric, endocrine, integumentary and hematologic systems were reviewed and are otherwise negative/unremarkable except for positive findings mentioned above in the HPI.   MEDICATIONS AT HOME:   Prior to Admission medications   Medication Sig Start Date End Date Taking? Authorizing Provider  cloNIDine (CATAPRES) 0.2 MG tablet Take 1 tablet (0.2 mg total) by mouth every 6 (six) hours. [id number]     folic acid (FOLVITE) 1 MG tablet Take 1 tablet (1 mg total) by mouth daily. 01/19/20   Swayze, Ava, DO  gabapentin (NEURONTIN) 100 MG capsule TAKE ONE CAPSULE BY MOUTH THREE TIMES DAILY Patient taking differently: Take 100 mg by mouth in the morning, at noon, and at bedtime.  06/04/19   Lavera Guise, MD  gabapentin (NEURONTIN) 300 MG capsule Take one capsule by mouth every morning and evening and take 2 capsules at bedtime. 10/26/20     HYDROcodone-acetaminophen (NORCO/VICODIN) 5-325 MG tablet Take 1 tablet by mouth every 4 (four) hours as needed (pain). 01/22/20   Judith Part, MD  lamoTRIgine (LAMICTAL) 100 MG tablet Take 1 tablet (100 mg total) by mouth 2 (two) times daily. 02/07/21     lamoTRIgine (LAMICTAL) 25 MG tablet TAKE ONE TABLET BY MOUTH AT NIGHT FOR 2 WEEKS. THEN TAKE 1 TABLET 2 TIMES A DAY FOR 2 WEEKS. THEN TAKE 2 TABLETS 2 TIMES A DAY 10/26/20 10/26/21  Vladimir Crofts, MD  levETIRAcetam (KEPPRA) 1000 MG tablet Take 1 tablet (1,000 mg total) by mouth every 12 (twelve) hours. 07/18/20   Carrie Mew, MD  lisinopril (ZESTRIL) 40 MG tablet TAKE ONE TABLET BY MOUTH ONCE DAILY Patient not taking: No sig reported 06/21/19   Lavera Guise, MD  meloxicam (MOBIC) 7.5 MG tablet Take 1 tablet (7.5 mg total) by mouth daily. 12/20/20   Leonel Ramsay, MD  Multiple Vitamin (MULTIVITAMIN WITH MINERALS) TABS tablet Take 1 tablet by mouth daily. 01/19/20   Swayze, Ava, DO  nicotine (NICOTROL) 10 MG  inhaler 6-16 catridges/day inhaled x 6-12 weeks, then taper dose over 6-12 week to discontinue smoking Patient taking differently: Inhale 1 continuous puffing into the lungs daily as needed for smoking cessation. 6-16 catridges/day inhaled x 6-12 weeks, then taper dose over 6-12 week to discontinue smoking 07/02/17   Zara Council A, PA-C  thiamine 100 MG tablet Take 1 tablet (100 mg total) by mouth daily. Patient not taking: Reported on 01/19/2020 01/19/20   Swayze, Ava, DO      VITAL SIGNS:  Blood pressure (!) 143/70, pulse 72, temperature 98.4 F (36.9 C), temperature source Oral, resp. rate (!) 23, height 6' (1.829 m), weight 89 kg, SpO2 100 %.  PHYSICAL EXAMINATION:  Physical Exam  GENERAL:  60 y.o.-year-old Caucasian male patient lying in the bed with  no acute distress.  EYES: Pupils equal, round, reactive to light and accommodation. No scleral icterus. Extraocular muscles intact.  HEENT: Head atraumatic, normocephalic. Oropharynx and nasopharynx clear.  He was alert and oriented only to his name. NECK:  Supple, no jugular venous distention. No thyroid enlargement, no tenderness.  LUNGS: Normal breath sounds bilaterally, no wheezing, rales,rhonchi or crepitation. No use of accessory muscles of respiration.  CARDIOVASCULAR: Regular rate and rhythm, S1, S2 normal. No murmurs, rubs, or gallops.  ABDOMEN: Soft, nondistended, nontender. Bowel sounds present. No organomegaly or mass.  EXTREMITIES: No pedal edema, cyanosis, or clubbing.  NEUROLOGIC: Cranial nerves II through XII are intact. Muscle strength 5/5 in all extremities. Sensation intact. Gait not checked.  PSYCHIATRIC: The patient isHe was alert and oriented only to his name.  He had flat affect and good eye contact. SKIN: No obvious rash, lesion, or ulcer.   LABORATORY PANEL:   CBC Recent Labs  Lab 04/10/21 2024  WBC 7.9  HGB 15.3  HCT 42.6  PLT 175    ------------------------------------------------------------------------------------------------------------------  Chemistries  Recent Labs  Lab 04/10/21 2024  NA 127*  K 3.0*  CL 88*  CO2 25  GLUCOSE 111*  BUN 8  CREATININE 0.82  CALCIUM 8.6*  AST 50*  ALT 20  ALKPHOS 94  BILITOT 1.4*   ------------------------------------------------------------------------------------------------------------------  Cardiac Enzymes No results for input(s): TROPONINI in the last 168 hours. ------------------------------------------------------------------------------------------------------------------  RADIOLOGY:  CT HEAD WO CONTRAST (5MM)  Result Date: 04/10/2021 CLINICAL DATA:  Mental status change, unknown cause EXAM: CT HEAD WITHOUT CONTRAST TECHNIQUE: Contiguous axial images were obtained from the base of the skull through the vertex without intravenous contrast. COMPARISON:  Brain MRI 03/05/2021, head CT 07/17/2020 FINDINGS: Brain: No acute hemorrhage. No evidence of acute ischemia. Again seen right frontal encephalomalacia. Lesser encephalomalacia in the left temporal lobe. Previous MRI demonstrated residual meningioma which is not well appreciated by CT. CT appearance is stable from prior exam. No subdural or extra-axial collection. Stable ventricular size and morphology without hydrocephalus. Vascular: Skull base atherosclerosis without hyperdense vessel. Skull: Prior right frontal and left temporoparietal craniotomy. No fracture or acute findings. Sinuses/Orbits: No acute findings. Paranasal sinuses and mastoid air cells are clear. Other: None. IMPRESSION: 1. No acute intracranial abnormality. Stable CT appearance of the brain. 2. Stable encephalomalacia in the right frontal and left temporal lobes. Previous MRI demonstrated residual meningioma which is not well appreciated by CT. 3. Prior craniotomy. Electronically Signed   By: Keith Rake M.D.   On: 04/10/2021 22:51   DG Chest  Portable 1 View  Result Date: 04/10/2021 CLINICAL DATA:  Hypoxia EXAM: PORTABLE CHEST 1 VIEW COMPARISON:  01/12/2020 FINDINGS: Peribronchial thickening. No confluent opacities or effusions. Heart is normal size. IMPRESSION: Bronchitic changes. Electronically Signed   By: Rolm Baptise M.D.   On: 04/10/2021 22:16      IMPRESSION AND PLAN:  Active Problems:   * No active hospital problems. *  1.  Seizure disorder.  He may be having absence seizures.  He could be having alcohol withdrawal as well. - The patient will be admitted to a progressive unit bed. - He will be placed on seizures precautions. - We will continue his Lamictal and Neurontin as well as Keppra. - We will place on as needed IV Ativan. - Neurology consult to be obtained.  I notified Dr. Rory Percy about the patient. - EEG will be obtained.  2.  Altered mental status with acute encephalopathy possibly with postictal prolonged phase. - We  will follow neurochecks every 4 hours for 24 hours. - This could be partly related to hyponatremia.  We will hydrate and follow sodium level.  3.  Hyponatremia. - He will be hydrated with IV normal saline and will follow his BMP.  4.  Hypokalemia. - Potassium will be replaced and magnesium level will be checked.  5.  Essential hypertension. - We will continue Zestril and clonidine.   DVT prophylaxis: Lovenox.  Code Status: full code.  Family Communication:  The plan of care was discussed in details with the patient (and family). I answered all questions. The patient agreed to proceed with the above mentioned plan. Further management will depend upon hospital course. Disposition Plan: Back to previous home environment Consults called: Neurology. All the records are reviewed and case discussed with ED provider.  Status is: Inpatient  Remains inpatient appropriate because:Altered mental status, Ongoing diagnostic testing needed not appropriate for outpatient work up, Unsafe d/c plan, IV  treatments appropriate due to intensity of illness or inability to take PO, and Inpatient level of care appropriate due to severity of illness  Dispo: The patient is from: Home              Anticipated d/c is to: Home              Patient currently is not medically stable to d/c.   Difficult to place patient No   TOTAL TIME TAKING CARE OF THIS PATIENT: 55 minutes.    Christel Mormon M.D on 04/11/2021 at 12:07 AM  [facility]   From 7 PM-7 AM, contact night-coverage [email or web address]  CC: Primary care physician; Leonel Ramsay, MD

## 2021-04-11 NOTE — ED Provider Notes (Signed)
[facility]  ____________________________________________   Event Date/Time   First MD Initiated Contact with Patient 04/10/21 2328     (approximate)  I have reviewed the triage vital signs and the nursing notes.   HISTORY  Chief Complaint No chief complaint on file.    HPI Frederick Drake is a 60 y.o. male past medical history of meningioma resection, seizure disorder, hypertension, alcohol use disorder who presents with seizures.  I am unable to obtain history from the patient due to his altered mental status.  Per the patient's wife, today he was very confused, was frequently staring off into space which is not normal for him.  He then had a generalized tonic-clonic seizure that lasted for several minutes.  She does not think he has had a seizure for about a year.  She also notes that the night prior he had been vomiting multiple times but this is resolved by today.         Past Medical History:  Diagnosis Date   Allergy    Seasonal   Brain tumor (benign) ([place])    Carpal tunnel syndrome    Carpal tunnel syndrome    bilateral   Depression    Headache    Hypertension    Neuropathy    Both legs   Seizures ([place])     Patient Active Problem List   Diagnosis Date Noted   Status post craniotomy 01/20/2020   Seizure ([place]) 01/13/2020   Acute hypoxemic respiratory failure ([place])    Acute respiratory insufficiency    Leg pain 07/23/2017   Shoulder pain, left 07/23/2017   Brain tumor ([place]) 11/06/2016   Brain mass    Acute respiratory failure ([place])    Seizures ([place])    Aphasia    Meningioma ([place])    Acute encephalopathy    Status epilepticus ([place]) 10/16/2016   BPH associated with nocturia 11/02/2014   Hypertension 06/17/2011   Diabetes ([place]) 05/09/2010    Past Surgical History:  Procedure Laterality Date   CRANIOTOMY Left 11/06/2016   Procedure: LEFT Temporal CRANIOTOMY FOR TUMOR;  Surgeon: Ashok Pall, MD;  Location: [place];  Service:  Neurosurgery;  Laterality: Left;  LEFT Temproal CRANIOTOMY FOR TUMOR    CRANIOTOMY Right 01/20/2020   Procedure: Right frontal craniotomy for tumor resection;  Surgeon: Ashok Pall, MD;  Location: [place];  Service: Neurosurgery;  Laterality: Right;   HERNIA REPAIR     UMBICIAL   VASECTOMY      Prior to Admission medications   Medication Sig Start Date End Date Taking? Authorizing Provider  cloNIDine (CATAPRES) 0.2 MG tablet Take 1 tablet (0.2 mg total) by mouth every 6 (six) hours. [id number]     folic acid (FOLVITE) 1 MG tablet Take 1 tablet (1 mg total) by mouth daily. 01/19/20   Swayze, Ava, DO  gabapentin (NEURONTIN) 100 MG capsule TAKE ONE CAPSULE BY MOUTH THREE TIMES DAILY Patient taking differently: Take 100 mg by mouth in the morning, at noon, and at bedtime.  06/04/19   Lavera Guise, MD  gabapentin (NEURONTIN) 300 MG capsule Take one capsule by mouth every morning and evening and take 2 capsules at bedtime. 10/26/20     HYDROcodone-acetaminophen (NORCO/VICODIN) 5-325 MG tablet Take 1 tablet by mouth every 4 (four) hours as needed (pain). 01/22/20   Judith Part, MD  lamoTRIgine (LAMICTAL) 100 MG tablet Take 1 tablet (100 mg total) by mouth 2 (two) times daily. 02/07/21     lamoTRIgine (  LAMICTAL) 25 MG tablet TAKE ONE TABLET BY MOUTH AT NIGHT FOR 2 WEEKS. THEN TAKE 1 TABLET 2 TIMES A DAY FOR 2 WEEKS. THEN TAKE 2 TABLETS 2 TIMES A DAY 10/26/20 10/26/21  Vladimir Crofts, MD  levETIRAcetam (KEPPRA) 1000 MG tablet Take 1 tablet (1,000 mg total) by mouth every 12 (twelve) hours. 07/18/20   Carrie Mew, MD  lisinopril (ZESTRIL) 40 MG tablet TAKE ONE TABLET BY MOUTH ONCE DAILY Patient not taking: No sig reported 06/21/19   Lavera Guise, MD  meloxicam (MOBIC) 7.5 MG tablet Take 1 tablet (7.5 mg total) by mouth daily. 12/20/20   Leonel Ramsay, MD  Multiple Vitamin (MULTIVITAMIN WITH MINERALS) TABS tablet Take 1 tablet by mouth daily. 01/19/20   Swayze, Ava, DO  nicotine (NICOTROL) 10 MG  inhaler 6-16 catridges/day inhaled x 6-12 weeks, then taper dose over 6-12 week to discontinue smoking Patient taking differently: Inhale 1 continuous puffing into the lungs daily as needed for smoking cessation. 6-16 catridges/day inhaled x 6-12 weeks, then taper dose over 6-12 week to discontinue smoking 07/02/17   Zara Council A, PA-C  thiamine 100 MG tablet Take 1 tablet (100 mg total) by mouth daily. Patient not taking: Reported on 01/19/2020 01/19/20   Swayze, Ava, DO    Allergies Chantix [varenicline], Wasp venom, and Penicillins  Family History  Problem Relation Age of Onset   Cancer Sister        Breast    Social History Social History   Tobacco Use   Smoking status: Every Day    Packs/day: 2.00    Years: 43.00    Pack years: 86.00    Types: Cigarettes   Smokeless tobacco: Never  Vaping Use   Vaping Use: Never used  Substance Use Topics   Alcohol use: Yes   Drug use: Yes    Types: Marijuana    Comment: 01/19/2020- not currently    Review of Systems   Review of Systems  Unable to perform ROS: Mental status change   Physical Exam Updated Vital Signs BP (!) 143/70   Pulse 72   Temp 98.4 F (36.9 C) (Oral)   Resp (!) 23   Ht 6' (1.829 m)   Wt 89 kg   SpO2 100%   BMI 26.61 kg/m   Physical Exam Vitals and nursing note reviewed.  Constitutional:      General: He is not in acute distress.    Comments: Appears disheveled  HENT:     Head: Normocephalic and atraumatic.  Eyes:     General: No scleral icterus.    Conjunctiva/sclera: Conjunctivae normal.     Pupils: Pupils are equal, round, and reactive to light.  Cardiovascular:     Rate and Rhythm: Normal rate.  Pulmonary:     Effort: Pulmonary effort is normal. No respiratory distress.     Breath sounds: Normal breath sounds. No wheezing.  Abdominal:     General: There is no distension.     Tenderness: There is no abdominal tenderness.  Musculoskeletal:        General: No deformity or signs of  injury.     Cervical back: Normal range of motion.  Skin:    Coloration: Skin is not jaundiced or pale.  Neurological:     Mental Status: He is alert.     Comments: Patient opens eyes to voice, oriented to person but not place or year Repeats himself multiple times Confused, does not know his wife's name  Psychiatric:  Comments: Unable to assess     LABS (all labs ordered are listed, but only abnormal results are displayed)  Labs Reviewed  CBC WITH DIFFERENTIAL/PLATELET - Abnormal; Notable for the following components:      Result Value   MCH 34.3 (*)    All other components within normal limits  COMPREHENSIVE METABOLIC PANEL - Abnormal; Notable for the following components:   Sodium 127 (*)    Potassium 3.0 (*)    Chloride 88 (*)    Glucose, Bld 111 (*)    Calcium 8.6 (*)    AST 50 (*)    Total Bilirubin 1.4 (*)    All other components within normal limits  CBG MONITORING, ED - Abnormal; Notable for the following components:   Glucose-Capillary 113 (*)    All other components within normal limits  LIPASE, BLOOD  ETHANOL  LAMOTRIGINE LEVEL  URINE DRUG SCREEN, QUALITATIVE ([facility] ONLY)  HIV ANTIBODY (ROUTINE TESTING W REFLEX)  BASIC METABOLIC PANEL  CBC  MAGNESIUM   ____________________________________________  EKG  Prolonged QT interval, normal axis, normal sinus rhythm, no acute ischemic changes ____________________________________________  RADIOLOGY I, Madelin Headings, personally viewed and evaluated these images (plain radiographs) as part of my medical decision making, as well as reviewing the written report by the radiologist.  ED MD interpretation: CT head was obtained which does not show any acute process    ____________________________________________   PROCEDURES  Procedure(s) performed (including Critical Care):  Procedures   ____________________________________________   INITIAL IMPRESSION / ASSESSMENT AND PLAN / ED COURSE      60 year old male with seizure disorder secondary to meningioma removal who presents with altered mental status after seizure.  Patient on my initial evaluation is fairly somnolent and does not follow commands.  On reassessment he is more awake but still very confused and repetitive.  In with his wife seems that he had not been himself today had multiple staring episodes which I question whether they were focal seizures.  Then had a full tonic-clonic seizure at home.  Had also been vomiting the night before.  With this new information I obtained labs and a CT head.  Labs are notable for mild hyponatremia to 127, potassium of 3.  EKG does show prolonged QT interval.  Patient given a liter of normal saline and p.o. potassium.  CT head does not show anything acute, only chronic changes.  Patient was observed for about 3 hours in the ED with still abnormal mental status.  Decision was made to admit for further work-up and monitoring for prolonged postictal phase      ____________________________________________   FINAL CLINICAL IMPRESSION(S) / ED DIAGNOSES  Final diagnoses:  Delirium  Seizure [facility])     ED Discharge Orders     None        Note:  This document was prepared using Dragon voice recognition software and may include unintentional dictation errors.    Rada Hay, MD 04/11/21 Laureen Abrahams

## 2021-04-11 NOTE — ED Notes (Signed)
Patient incontinent of urine on arrival to pod C room 32. Linen and gown changed. Oriented to room.

## 2021-04-11 NOTE — Procedures (Signed)
Patient Name: Frederick Drake  MRN: [id number]  Epilepsy Attending: Lora Havens  Referring Physician/Provider: Dr Amie [place] Date: 04/11/2021 Duration: 25.11 mins  Patient history: 60 year old man with past medical history of multiple meningioma status postresection via craniotomies as well as seizures, alcohol abuse presents for evaluation of concern for seizures-this time different than her as focal seizures with secondary generalization-this time was more staring spells.  Level of alertness: Awake  AEDs during EEG study: VPA, LTG, GBP  Technical aspects: This EEG study was done with scalp electrodes positioned according to the 10-20 International system of electrode placement. Electrical activity was acquired at a sampling rate of '500Hz'$  and reviewed with a high frequency filter of '70Hz'$  and a low frequency filter of '1Hz'$ . EEG data were recorded continuously and digitally stored.   Description: The posterior dominant rhythm consists of 7.5 Hz activity of moderate voltage (25-35 uV) seen predominantly in posterior head regions, asymmetric ( L<R) and reactive to eye opening and eye closing. EEG showed continuous 3 to 5 Hz sharply contoured 3 to 5 Hz theta slowing in left temporal region which at times appears rhythmic. There is also intermittent generalized 3-5 theta-delta slowing. Sharp waves was noted in left anterior temporal region. Physiologic photic driving was not seen during photic stimulation.  Hyperventilation was not performed.     ABNORMALITY -Sharp wave, left anterior temporal region. 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  Additionally there is cortical dysfunction left temporal region likely due to underlying structural abnormality.  Of note, this EEG pattern is also on  the ictal-interictal continuum with low potential for seizures.  Additionally there is mild diffuse encephalopathy, nonspecific etiology.  No definite seizures were seen during the study.  Campbell Kray Barbra Sarks

## 2021-04-11 NOTE — Care Management CC44 (Signed)
Condition Code 44 Documentation Completed  Patient Details  Name: Frederick Drake MRN: [id number] Date of Birth: 1961-06-11   Condition Code 44 given:  Yes Patient signature on Condition Code 44 notice:  Yes Documentation of 2 MD's agreement:  Yes Code 44 added to claim:  Yes    Anselm Pancoast, RN 04/11/2021, 5:44 PM

## 2021-04-11 NOTE — Progress Notes (Signed)
Eeg done 

## 2021-04-11 NOTE — Discharge Instructions (Signed)
SEIZURE PRECAUTIONS °Per [place] DMV statutes, patients with seizures are not allowed to drive until they have been seizure-free for six months. °  °Use caution when using heavy equipment or power tools. Avoid working on ladders or at heights. Take showers instead of baths. Ensure the water temperature is not too high on the home water heater. Do not go swimming alone. Do not lock yourself in a room alone (i.e. bathroom). When caring for infants or small children, sit down when holding, feeding, or changing them to minimize risk of injury to the child in the event you have a seizure. Maintain good sleep hygiene. Avoid alcohol. °  °If patient has another seizure, call 911 and bring them back to the ED if: °A.  The seizure lasts longer than 5 minutes.      °B.  The patient doesn't wake shortly after the seizure or has new problems such as difficulty seeing, speaking or moving following the seizure °C.  The patient was injured during the seizure °D.  The patient has a temperature over 102 F (39C) °E.  The patient vomited during the seizure and now is having trouble breathing ° ° °

## 2021-04-11 NOTE — Progress Notes (Signed)
Patient discharged to home via wheelchair, accompanied by volunteer and spouse. Discharge teaching completed and included: new medication regimen, follow-up appointment information, driving restrictions, and signs & symptoms to report/return with immediately. Patient and spouse verbalize understanding of all discharge info and ask appropriate questions.

## 2021-04-11 NOTE — Progress Notes (Signed)
EEG reviewed with Dr. Hortense Ramal.  Formal report being dictated. He has some intermittent rhythmic left temporal slowing but no clear seizures. In an ideal world, this patient should be monitored on continuous EEG to ensure there is no intermittent seizure activity but those facilities are not available here at [facility]. Since he also is at high risk for going to alcohol withdrawal and developing seizures, I am in favor of discharging him with instructions to his partner to bring him back should anything be out of the ordinary-have more seizures or becomes altered.  For antiepileptics, he should continue home dose of lamotrigine 100 twice daily, the newly added Depakote 500 twice daily (loaded this morning in the hospital IV) as well as he is also on gabapentin, reasonable antiepileptic coverage at this time.  He should follow-up with Dr. Marcelyn Ditty [facility] neurologist at [facility].  Seizure precautions should be handed out to him as well.  Plan discussed with Dr. Posey Pronto over secure chat  -- Amie Portland, MD Neurologist [facility] Pager: [phone]   SEIZURE PRECAUTIONS Per [facility] statutes, patients with seizures are not allowed to drive until they have been seizure-free for six months.   Use caution when using heavy equipment or power tools. Avoid working on ladders or at heights. Take showers instead of baths. Ensure the water temperature is not too high on the home water heater. Do not go swimming alone. Do not lock yourself in a room alone (i.e. bathroom). When caring for infants or small children, sit down when holding, feeding, or changing them to minimize risk of injury to the child in the event you have a seizure. Maintain good sleep hygiene. Avoid alcohol.    If patient has another seizure, call 911 and bring them back to the ED if: A.  The seizure lasts longer than 5 minutes.      B.  The patient doesn't wake shortly after the  seizure or has new problems such as difficulty seeing, speaking or moving following the seizure C.  The patient was injured during the seizure D.  The patient has a temperature over 102 F (39C) E.  The patient vomited during the seizure and now is having trouble breathing

## 2021-04-12 LAB — LAMOTRIGINE LEVEL: Lamotrigine Lvl: 2.3 ug/mL (ref 2.0–20.0)

## 2021-04-19 ENCOUNTER — Other Ambulatory Visit: Payer: Self-pay | Admitting: Specialist

## 2021-04-19 DIAGNOSIS — R059 Cough, unspecified: Secondary | ICD-10-CM

## 2021-04-19 DIAGNOSIS — J181 Lobar pneumonia, unspecified organism: Secondary | ICD-10-CM

## 2021-05-10 ENCOUNTER — Ambulatory Visit: Payer: Medicare Other

## 2021-05-28 ENCOUNTER — Ambulatory Visit: Payer: Medicare Other

## 2021-06-21 ENCOUNTER — Telehealth: Payer: Self-pay | Admitting: Primary Care

## 2021-06-21 NOTE — Telephone Encounter (Signed)
Spoke with patient's wife [name], regarding the Palliative referral/services and all questions were answered and she was in agreement with scheduling visit.  I have scheduled an In-home Consult for 06/25/21 @ 1:30 PM

## 2021-06-25 ENCOUNTER — Other Ambulatory Visit: Payer: Medicare Other | Admitting: Primary Care

## 2021-06-25 ENCOUNTER — Encounter: Payer: Self-pay | Admitting: Primary Care

## 2021-06-25 ENCOUNTER — Other Ambulatory Visit: Payer: Self-pay

## 2021-06-25 DIAGNOSIS — Z515 Encounter for palliative care: Secondary | ICD-10-CM

## 2021-06-25 DIAGNOSIS — D496 Neoplasm of unspecified behavior of brain: Secondary | ICD-10-CM

## 2021-06-25 DIAGNOSIS — R569 Unspecified convulsions: Secondary | ICD-10-CM

## 2021-06-25 NOTE — Progress Notes (Signed)
[facility] Consult Note Telephone: [phone]  Fax: [phone]   Date of encounter: 06/25/21 2:48 PM PATIENT NAME: Frederick Drake [address] [place] [address]   [phone] (home)  DOB: Jun 16, 1961 MRN: [id number] PRIMARY CARE PROVIDER:    Leonel Ramsay, MD,  [place] [address] [phone]  REFERRING PROVIDER:   Leonel Ramsay, MD [place],  [place] [address] [phone]  RESPONSIBLE PARTY:    Contact Information     Name Relation Home Work Mobile   [place] Spouse   [place] Daughter [phone]  [phone]        I met face to face with patient and family in  home/facility. Face to face meeting for purpose of assessing for home health and DME needs.  Palliative Care was asked to follow this patient by consultation request of  Leonel Ramsay, MD to address advance care planning and complex medical decision making. This is the initial visit.                                     ASSESSMENT AND PLAN / RECOMMENDATIONS:   Advance Care Planning/Goals of Care: Goals include to maximize quality of life and symptom management. Patient/health care surrogate gave his/her permission to discuss.Our advance care planning conversation included a discussion about:    The value and importance of advance care planning   Exploration of personal, cultural or spiritual beliefs that might influence medical decisions  Exploration of goals of care in the event of a sudden injury or illness  Identification  of a healthcare agent wife  Creation of an  advance directive document - MOST   CODE STATUS: FULL CODE  I completed a MOST form today. The patient and family outlined their wishes for the following treatment decisions:  Cardiopulmonary Resuscitation: Attempt Resuscitation (CPR)  Medical Interventions: Limited Additional  Interventions: Use medical treatment, IV fluids and cardiac monitoring as indicated, DO NOT USE intubation or mechanical ventilation. May consider use of less invasive airway support such as BiPAP or CPAP. Also provide comfort measures. Transfer to the hospital if indicated. Avoid intensive care.   Antibiotics: Antibiotics if indicated  IV Fluids: IV fluids for a defined trial period  Feeding Tube: Feeding tube for a defined trial period    Symptom Management/Plan: Patient is home bound and requires intermittent skilled services.   Mobility: Assessed for Home health. Needs PT and Ot eval and treat. Frequent falls, DME is old and not his original equipment. No ramp access, needs safety teaching and resources. Needs SW services as well for community resources.  Hygiene: has trouble getting into bathroom due to falls and ataxia. Needs bathrooms assessed for devices, safety.Needs CNA services  Seizures: On medication, has frequently due to meningiomas. These are stable per scans. However he has frequent seizures and falls.  Follow up Palliative Care Visit: Palliative care will continue to follow for complex medical decision making, advance care planning, and clarification of goals. Return 2-3 weeks or prn.  I spent 60 minutes providing this consultation. More than 50% of the time in this consultation was spent in counseling and care coordination.  PPS: 40%  HOSPICE ELIGIBILITY/DIAGNOSIS: TBD  Chief Complaint: ataxia, falls   HISTORY OF PRESENT ILLNESS:  Frederick Drake is a 60 y.o. year old male  with h/o  copd, smoking, meningiomas surgically removed, seizure disorder, falls .   History obtained from review of EMR, discussion with primary team, and interview with family, facility staff/caregiver and/or Mr. Loya.  I reviewed available labs, medications, imaging, studies and related documents from the EMR.  Records reviewed and summarized above.   ROS   General: NAD EYES: denies vision  changes ENMT: denies dysphagia Cardiovascular: denies chest pain, denies DOE Pulmonary: endorses cough, endorses increased SOB Abdomen: endorses good appetite, denies constipation, endorses continence of bowel GU: denies dysuria, endorses continence of urine MSK:  endorses weakness,  frequent  falls reported Skin: denies rashes or wounds Neurological: denies pain, denies insomnia, endorses seizures Psych: Endorses positive mood Heme/lymph/immuno: denies bruises, abnormal bleeding  Physical Exam: Current and past weights: 197 lbs reported 9/22. Constitutional: NAD General: frail appearing, thin EYES: anicteric sclera, lids intact, no discharge  ENMT: intact hearing, oral mucous membranes moist, dentition intact CV: S1S2, RRR, no LE edema Pulmonary: LCTA, no increased work of breathing, no cough, room air Abdomen: intake 100%, normo-active BS + 4 quadrants, soft and non tender, no ascites GU: deferred DTH:YHOOIL sarcopenia, moves all extremities, ambulatory with walker  Skin: warm and over dry, + rashes or wounds on visible skin Neuro:  + generalized weakness,  + cognitive impairment Psych: non-anxious affect, A and O x 2-3 Hem/lymph/immuno: no widespread bruising CURRENT PROBLEM LIST:  Patient Active Problem List   Diagnosis Date Noted   Status post craniotomy 01/20/2020   Seizure ([place]) 01/13/2020   Acute hypoxemic respiratory failure (HCC)    Acute respiratory insufficiency    Leg pain 07/23/2017   Shoulder pain, left 07/23/2017   Brain tumor ([place]) 11/06/2016   Brain mass    Acute respiratory failure (HCC)    Seizures (HCC)    Aphasia    Meningioma (HCC)    Acute encephalopathy    Status epilepticus ([place]) 10/16/2016   BPH associated with nocturia 11/02/2014   Hypertension 06/17/2011   Diabetes ([place]) 05/09/2010   PAST MEDICAL HISTORY:  Active Ambulatory Problems    Diagnosis Date Noted   BPH associated with nocturia 11/02/2014   Hypertension 06/17/2011   Diabetes  ([place]) 05/09/2010   Status epilepticus ([place]) 10/16/2016   Acute respiratory failure (HCC)    Seizures (HCC)    Aphasia    Meningioma (HCC)    Acute encephalopathy    Brain mass    Brain tumor ([place]) 11/06/2016   Leg pain 07/23/2017   Shoulder pain, left 07/23/2017   Seizure ([place]) 01/13/2020   Acute hypoxemic respiratory failure (HCC)    Acute respiratory insufficiency    Status post craniotomy 01/20/2020   Resolved Ambulatory Problems    Diagnosis Date Noted   No Resolved Ambulatory Problems   Past Medical History:  Diagnosis Date   Allergy    Brain tumor (benign) (HCC)    Carpal tunnel syndrome    Carpal tunnel syndrome    Depression    Headache    Neuropathy    SOCIAL HX:  Social History   Tobacco Use   Smoking status: Every Day    Packs/day: 2.00    Years: 43.00    Pack years: 86.00    Types: Cigarettes   Smokeless tobacco: Never  Substance Use Topics   Alcohol use: Yes   FAMILY HX:  Family History  Problem Relation Age of Onset   Cancer Sister        Breast      ALLERGIES:  Allergies  Allergen Reactions  Keppra [Levetiracetam] Anxiety and Other (See Comments)    Severe mood changes   Chantix [Varenicline] Hives   Wasp Venom Swelling   Penicillins Rash    Has patient had a PCN reaction causing immediate rash, facial/tongue/throat swelling, SOB or lightheadedness with hypotension:YES Has patient had a PCN reaction causing severe rash involving mucus membranes or skin necrosis: NO Has patient had a PCN reaction that required hospitalization NO Has patient had a PCN reaction occurring within the last 10 years: NO If all of the above answers are "NO", then may proceed with Cephalosporin use.     PERTINENT MEDICATIONS:  Outpatient Encounter Medications as of 06/25/2021  Medication Sig   cloNIDine (CATAPRES) 0.2 MG tablet Take 1 tablet (0.2 mg total) by mouth every 6 (six) hours.   divalproex (DEPAKOTE) 500 MG DR tablet Take 1 tablet (500 mg total) by  mouth every 12 (twelve) hours.   gabapentin (NEURONTIN) 100 MG capsule TAKE ONE CAPSULE BY MOUTH THREE TIMES DAILY (Patient not taking: Reported on 04/11/2021)   gabapentin (NEURONTIN) 300 MG capsule Take one capsule by mouth every morning and evening and take 2 capsules at bedtime.   HYDROcodone-acetaminophen (NORCO/VICODIN) 5-325 MG tablet Take 1 tablet by mouth every 4 (four) hours as needed (pain).   lamoTRIgine (LAMICTAL) 100 MG tablet Take 1 tablet (100 mg total) by mouth 2 (two) times daily.   Multiple Vitamin (MULTIVITAMIN WITH MINERALS) TABS tablet Take 1 tablet by mouth daily. (Patient not taking: Reported on 04/11/2021)   nicotine (NICOTROL) 10 MG inhaler 6-16 catridges/day inhaled x 6-12 weeks, then taper dose over 6-12 week to discontinue smoking (Patient taking differently: Inhale 1 continuous puffing into the lungs daily as needed for smoking cessation. 6-16 catridges/day inhaled x 6-12 weeks, then taper dose over 6-12 week to discontinue smoking)   thiamine 100 MG tablet Take 1 tablet (100 mg total) by mouth daily. (Patient not taking: Reported on 01/19/2020)   No facility-administered encounter medications on file as of 06/25/2021.   Thank you for the opportunity to participate in the care of Mr. Umscheid.  The palliative care team will continue to follow. Please call our office at [phone] if we can be of additional assistance.   Jason Coop, NP , DNP, AGPCNP-BC  COVID-19 PATIENT SCREENING TOOL Asked and negative response unless otherwise noted:  Have you had symptoms of covid, tested positive or been in contact with someone with symptoms/positive test in the past 5-10 days?

## 2021-06-28 ENCOUNTER — Telehealth: Payer: Self-pay

## 2021-06-28 NOTE — Telephone Encounter (Signed)
PC SW outreach. SW outreached patients spouse, Lynelle Smoke, per [facility] NP - K. Smiht, to assess needs.  Call unsuccessful. SW unable to LVM, due to mailbox being full. SW will attempt t outreach again at later date/time

## 2021-07-08 ENCOUNTER — Other Ambulatory Visit: Payer: Medicare Other | Admitting: Primary Care

## 2021-07-08 ENCOUNTER — Other Ambulatory Visit: Payer: Self-pay

## 2021-07-08 DIAGNOSIS — G9340 Encephalopathy, unspecified: Secondary | ICD-10-CM | POA: Diagnosis not present

## 2021-07-08 DIAGNOSIS — M25512 Pain in left shoulder: Secondary | ICD-10-CM | POA: Diagnosis not present

## 2021-07-08 DIAGNOSIS — G40901 Epilepsy, unspecified, not intractable, with status epilepticus: Secondary | ICD-10-CM | POA: Diagnosis not present

## 2021-07-08 DIAGNOSIS — E1140 Type 2 diabetes mellitus with diabetic neuropathy, unspecified: Secondary | ICD-10-CM | POA: Diagnosis not present

## 2021-07-08 DIAGNOSIS — R296 Repeated falls: Secondary | ICD-10-CM | POA: Diagnosis not present

## 2021-07-08 DIAGNOSIS — D320 Benign neoplasm of cerebral meninges: Secondary | ICD-10-CM | POA: Diagnosis not present

## 2021-07-08 DIAGNOSIS — R270 Ataxia, unspecified: Secondary | ICD-10-CM | POA: Diagnosis not present

## 2021-07-08 DIAGNOSIS — I10 Essential (primary) hypertension: Secondary | ICD-10-CM | POA: Diagnosis not present

## 2021-07-08 DIAGNOSIS — R69 Illness, unspecified: Secondary | ICD-10-CM | POA: Diagnosis not present

## 2021-07-08 DIAGNOSIS — R4701 Aphasia: Secondary | ICD-10-CM | POA: Diagnosis not present

## 2021-07-09 ENCOUNTER — Other Ambulatory Visit: Payer: Self-pay

## 2021-07-09 ENCOUNTER — Other Ambulatory Visit: Payer: Medicare Other | Admitting: Primary Care

## 2021-07-09 DIAGNOSIS — R296 Repeated falls: Secondary | ICD-10-CM

## 2021-07-09 DIAGNOSIS — R270 Ataxia, unspecified: Secondary | ICD-10-CM

## 2021-07-09 DIAGNOSIS — Z515 Encounter for palliative care: Secondary | ICD-10-CM

## 2021-07-09 DIAGNOSIS — N401 Enlarged prostate with lower urinary tract symptoms: Secondary | ICD-10-CM

## 2021-07-09 DIAGNOSIS — M79604 Pain in right leg: Secondary | ICD-10-CM

## 2021-07-09 DIAGNOSIS — R351 Nocturia: Secondary | ICD-10-CM

## 2021-07-09 DIAGNOSIS — D496 Neoplasm of unspecified behavior of brain: Secondary | ICD-10-CM

## 2021-07-09 DIAGNOSIS — M79605 Pain in left leg: Secondary | ICD-10-CM

## 2021-07-09 DIAGNOSIS — R569 Unspecified convulsions: Secondary | ICD-10-CM

## 2021-07-09 NOTE — Progress Notes (Addendum)
[facility] Consult Note Telephone: [phone]  Fax: [phone]    Date of encounter: 07/09/21 4:41 PM PATIENT NAME: Frederick Drake [address] [place] [address]   [phone] (home)  DOB: [birth year] MRN: [id number] PRIMARY CARE PROVIDER:    Leonel Ramsay, MD,  [place] [address] [phone]  REFERRING PROVIDER:   Leonel Ramsay, MD [place],  [place] [address] [phone]  RESPONSIBLE PARTY:    Contact Information     Name Relation Home Work Mobile   Frederick Drake Spouse   [place] Daughter [phone]  [phone]        I met face to face with patient and family in his home. Palliative Care was asked to follow this patient by consultation request of  Frederick Ramsay, MD to address advance care planning and complex medical decision making. This is a follow up visit.                                   ASSESSMENT AND PLAN / RECOMMENDATIONS:   Advance Care Planning/Goals of Care: Goals include to maximize quality of life and symptom management. Our advance care planning conversation included a discussion about:    CODE STATUS: Attempt CPR Has MOST Form Completed, uploaded to [email or web address].  Face to face visit is made to assess DME needs. Order for 4 wheel rolling walker for gait imbalance, ataxia, fall risk, instability. Dx: Seizure disorder R 56.9, Ataxia of gait: R27.0,  Frequent Falls R29.6, Leg pain M79.604 Symptom Management/Plan:  Mobility: Home health in place for a week now,  PT  to assist with strengthening and safety awareness. Ambulating with a borrowed RW and therapy is ordering a rollator and a shower chair. SW is looking into having a community church build a ramp, power bill assistance and eligibility for food stamp supplementation. Currently heating via kerosene "when he has the money" as kerosene has been  $6.49.  Awaiting a form in the mail for food stamp qualification. Has had 2 additional falls since last visit. Patient endorses chronic right leg neuropathy and leg drag. Taking Gabapentin which he feels helps. Discussed proper foot wear and frequent rest breaks for stability and support.  Hygiene: has trouble getting into bathroom due to falls and ataxia. Bathroom assessed for devices and safety by PT. Patient unable to get into tub or shower at this time. He is ambulating to the bathroom most times, does have a urinal nearby and wears depends at night. Does Needs CNA services.   Nutrition: Reports his appetite has improved; wife prepares meals. Reviewed limiting sodium due to edeam.  Smoking Cessation: Discussed benefits of smoking cessation and likelihood of lung capacity improving. Offered nicotine patches to patient. Patient deferred at this time but does have Nicotrol on hand.    Follow up Palliative Care Visit: Palliative care will continue to follow for complex medical decision making, advance care planning, and clarification of goals. Return 12 weeks or prn.  I spent 40 minutes providing this consultation. More than 50% of the time in this consultation was spent in counseling and care coordination.   PPS: 40%  HOSPICE ELIGIBILITY/DIAGNOSIS: TBD  Chief Complaint: Debility  HISTORY OF PRESENT ILLNESS:  Frederick Drake is a 60 y.o. year old male  with h/o copd, smoking, meningiomas surgically removed, seizure disorder, falls, and  peripheral neuropathy .   History obtained from review of EMR, discussion with primary team, and interview with family, facility staff/caregiver and/or Frederick Drake.  I reviewed available labs, medications, imaging, studies and related documents from the EMR.  Records reviewed and summarized above.   ROS  General: NAD EYES: denies vision changes ENMT: denies dysphagia Cardiovascular: denies chest pain, denies DOE Pulmonary: denies cough, denies increased  SOB Abdomen: endorses good appetite, denies constipation, endorses continence of bowel GU: denies dysuria, endorses occasional incontinence of urine at night MSK:  endorses weakness,  2 falls reported since last visit 06/25/2021 Skin: denies rashes or wounds Neurological: denies pain, denies insomnia Psych: Endorses positive mood Heme/lymph/immuno: denies bruises, abnormal bleeding  Physical Exam: Constitutional: NAD General: frail appearing, thin  EYES: anicteric sclera, lids intact, no discharge  ENMT: intact hearing, oral mucous membranes moist CV: S1S2, RRR, mild +1 LE edema Pulmonary: LCTA, no increased work of breathing, no cough, room air Abdomen: intake 100%, normo-active BS + 4 quadrants, soft and non tender, no ascites GU: deferred MSK: moderate sarcopenia, moves all extremities, ambulatory with rolling walker Skin: warm and dry, no rashes or wounds on visible skin Neuro: + generalized weakness,  no cognitive impairment Psych: non-anxious affect, A and O x 3 Hem/lymph/immuno: no widespread bruising  Thank you for the opportunity to participate in the care of Frederick Drake.  The palliative care team will continue to follow. Please call our office at [phone] if we can be of additional assistance.   Frederick Coop, NP DNP, AGPCNP-BC  COVID-19 PATIENT SCREENING TOOL Asked and negative response unless otherwise noted:   Have you had symptoms of covid, tested positive or been in contact with someone with symptoms/positive test in the past 5-10 days?

## 2021-07-12 ENCOUNTER — Telehealth: Payer: Self-pay | Admitting: Primary Care

## 2021-07-12 DIAGNOSIS — R270 Ataxia, unspecified: Secondary | ICD-10-CM | POA: Insufficient documentation

## 2021-07-12 NOTE — Telephone Encounter (Signed)
T/c from [place], PT at [facility] needing PT orders. These were given and will be signed. He also recommended rollator walker, which I have also ordered  From [employer].

## 2021-08-20 ENCOUNTER — Other Ambulatory Visit: Payer: Medicare HMO | Admitting: Primary Care

## 2021-08-22 ENCOUNTER — Other Ambulatory Visit: Payer: Medicare HMO | Admitting: Primary Care

## 2021-08-22 ENCOUNTER — Other Ambulatory Visit: Payer: Self-pay

## 2021-08-22 DIAGNOSIS — J9601 Acute respiratory failure with hypoxia: Secondary | ICD-10-CM | POA: Diagnosis not present

## 2021-08-22 DIAGNOSIS — R569 Unspecified convulsions: Secondary | ICD-10-CM | POA: Diagnosis not present

## 2021-08-22 DIAGNOSIS — G40901 Epilepsy, unspecified, not intractable, with status epilepticus: Secondary | ICD-10-CM | POA: Diagnosis not present

## 2021-08-22 DIAGNOSIS — R296 Repeated falls: Secondary | ICD-10-CM

## 2021-08-22 DIAGNOSIS — R270 Ataxia, unspecified: Secondary | ICD-10-CM

## 2021-08-22 DIAGNOSIS — I10 Essential (primary) hypertension: Secondary | ICD-10-CM | POA: Diagnosis not present

## 2021-08-22 DIAGNOSIS — R4701 Aphasia: Secondary | ICD-10-CM | POA: Diagnosis not present

## 2021-08-22 DIAGNOSIS — Z515 Encounter for palliative care: Secondary | ICD-10-CM | POA: Diagnosis not present

## 2021-08-22 DIAGNOSIS — D320 Benign neoplasm of cerebral meninges: Secondary | ICD-10-CM | POA: Diagnosis not present

## 2021-08-22 DIAGNOSIS — F32A Depression, unspecified: Secondary | ICD-10-CM | POA: Diagnosis not present

## 2021-08-22 DIAGNOSIS — F17210 Nicotine dependence, cigarettes, uncomplicated: Secondary | ICD-10-CM | POA: Diagnosis not present

## 2021-08-22 DIAGNOSIS — N401 Enlarged prostate with lower urinary tract symptoms: Secondary | ICD-10-CM | POA: Diagnosis not present

## 2021-08-22 DIAGNOSIS — D496 Neoplasm of unspecified behavior of brain: Secondary | ICD-10-CM

## 2021-08-22 DIAGNOSIS — M25512 Pain in left shoulder: Secondary | ICD-10-CM | POA: Diagnosis not present

## 2021-08-22 DIAGNOSIS — Z9181 History of falling: Secondary | ICD-10-CM | POA: Diagnosis not present

## 2021-08-22 DIAGNOSIS — G9340 Encephalopathy, unspecified: Secondary | ICD-10-CM | POA: Diagnosis not present

## 2021-08-22 DIAGNOSIS — R351 Nocturia: Secondary | ICD-10-CM | POA: Diagnosis not present

## 2021-08-22 DIAGNOSIS — E1140 Type 2 diabetes mellitus with diabetic neuropathy, unspecified: Secondary | ICD-10-CM | POA: Diagnosis not present

## 2021-08-22 DIAGNOSIS — R69 Illness, unspecified: Secondary | ICD-10-CM | POA: Diagnosis not present

## 2021-08-22 NOTE — Progress Notes (Signed)
[facility] Pall[facility] Consult Note Telephone: [phone]  Fax: [phone]    Date of encounter: 08/22/21 3:22 PM PATIENT NAME: KAVARI PARRILLO [address] [place] [address]   [phone] (home)  DOB: 1961-06-14 MRN: [id number] PRIMARY CARE PROVIDER:    Leonel Ramsay, MD,  [place] [address] [phone]  REFERRING PROVIDER:   Leonel Ramsay, MD [place],  [place] [address] [phone]  RESPONSIBLE PARTY:    Contact Information     Name Relation Home Work Mobile   [place] Spouse   [place] Daughter [phone]  [phone]       [name] Palliative Care was asked to follow this patient by consultation request of  Leonel Ramsay, MD to address advance care planning and complex medical decision making. This is a follow up visit.                                   ASSESSMENT AND PLAN / RECOMMENDATIONS:   Advance Care Planning/Goals of Care: Goals include to maximize quality of life and symptom management. Patient/health care surrogate gave his/her permission to discuss.Our advance care planning conversation included a discussion about:    Exploration of personal, cultural or spiritual beliefs that might influence medical decisions  Exploration of goals of care in the event of a sudden injury or illness  Identification of a healthcare agent - wife  Discussed his sx of ataxia in light of his alcohol intake. He states he knows he should quit but does not plan to at this time.  CODE STATUS: FULL CODE  Symptom Management/Plan:   Dizziness:  Endorses dizziness with divalproex and lamictal. Has not had neuro f/u since summer, likely lost to follow up. Has had falls and memory loss. Wife will call to make appt for follow up, for Feb 3.  Wife also denies seizures. Pt endorses memory  loss.  Mobility: Has had therapy  at home for 2 months. Has been working on steps with pt. Has new rollator which he enjoys.  Needs bathroom  assistive devices. Still needs ramp however for safe egress. Email sent to local church that installs ramps.  Neurology f/u: Discussed stopping to drink. States he is down to 7 drinks a day . States he knows he should quite but not interested in [place].  Has f/u at Feb 3. Endorses dizziness and ataxia with some of his medications.  Pulmonary: Lungs clear, does not have any inhalers.that he uses, and endorses occ DOE.Smoking 1 pack a day.Lungs clear today and no cough.  Follow up Palliative Care Visit: Palliative care will continue to follow for complex medical decision making, advance care planning, and clarification of goals. Return 8 weeks or prn.  I spent 70 minutes providing this consultation. More than 50% of the time in this consultation was spent in counseling and care coordination.  PPS: 40%  HOSPICE ELIGIBILITY/DIAGNOSIS: TBD  Chief Complaint: debility, fall risk  HISTORY OF PRESENT ILLNESS:  MAZIAH KEELING is a 61 y.o. year old male  with brain mass, debility, ataxia, alcohol and tobacco use syndrome, seizure history.  History obtained from review of EMR, discussion with primary team, and interview with family, facility staff/caregiver and/or Mr. Waltner.  I reviewed available labs, medications, imaging, studies and related documents  from the EMR.  Records reviewed and summarized above.   ROS   General: NAD EYES: denies vision changes ENMT: denies dysphagia Cardiovascular: denies chest pain, denies DOE Pulmonary: denies cough, endorses occ increased SOB Abdomen: endorses good appetite, denies constipation, endorses occ incontinence of bowel GU: denies dysuria, endorses  occ incontinence of urine MSK:  denies  increased weakness,  + falls reported Skin: denies rashes or wounds Neurological: denies pain, denies insomnia Psych: Endorses  positive mood Heme/lymph/immuno: denies bruises, abnormal bleeding  Physical Exam: Current and past weights:unavailable Constitutional: NAD General: frail appearing, thin, WNWD EYES: anicteric sclera, lids intact, no discharge  ENMT: intact hearing, oral mucous membranes moist, dentition intact CV: S1S2, RRR, no LE edema Pulmonary: LCTA, no increased work of breathing, no cough, room air Abdomen: intake 100%, normo-active BS + 4 quadrants, soft and non tender, no ascites GU: deferred MSK: mild sarcopenia, moves all extremities, ambulatory with walker Skin: warm and dry, no rashes or wounds on visible skin Neuro:  + generalized weakness,  ++ cognitive impairment Psych: non-anxious affect, A and O x 2-3, very forgetful Hem/lymph/immuno: no widespread bruising   Thank you for the opportunity to participate in the care of Mr. Madan.  The palliative care team will continue to follow. Please call our office at [phone] if we can be of additional assistance.   Jason Coop, NP DNP, AGPCNP-BC  COVID-19 PATIENT SCREENING TOOL Asked and negative response unless otherwise noted:   Have you had symptoms of covid, tested positive or been in contact with someone with symptoms/positive test in the past 5-10 days?

## 2021-10-09 DIAGNOSIS — G629 Polyneuropathy, unspecified: Secondary | ICD-10-CM | POA: Diagnosis not present

## 2021-10-09 DIAGNOSIS — R32 Unspecified urinary incontinence: Secondary | ICD-10-CM | POA: Diagnosis not present

## 2021-10-09 DIAGNOSIS — Z791 Long term (current) use of non-steroidal anti-inflammatories (NSAID): Secondary | ICD-10-CM | POA: Diagnosis not present

## 2021-10-09 DIAGNOSIS — N529 Male erectile dysfunction, unspecified: Secondary | ICD-10-CM | POA: Diagnosis not present

## 2021-10-09 DIAGNOSIS — Z818 Family history of other mental and behavioral disorders: Secondary | ICD-10-CM | POA: Diagnosis not present

## 2021-10-09 DIAGNOSIS — G8929 Other chronic pain: Secondary | ICD-10-CM | POA: Diagnosis not present

## 2021-10-09 DIAGNOSIS — Z833 Family history of diabetes mellitus: Secondary | ICD-10-CM | POA: Diagnosis not present

## 2021-10-09 DIAGNOSIS — Z008 Encounter for other general examination: Secondary | ICD-10-CM | POA: Diagnosis not present

## 2021-10-09 DIAGNOSIS — Z803 Family history of malignant neoplasm of breast: Secondary | ICD-10-CM | POA: Diagnosis not present

## 2021-10-09 DIAGNOSIS — Z720 Tobacco use: Secondary | ICD-10-CM | POA: Diagnosis not present

## 2021-10-09 DIAGNOSIS — I10 Essential (primary) hypertension: Secondary | ICD-10-CM | POA: Diagnosis not present

## 2021-10-09 DIAGNOSIS — Z7409 Other reduced mobility: Secondary | ICD-10-CM | POA: Diagnosis not present

## 2021-10-09 DIAGNOSIS — G40909 Epilepsy, unspecified, not intractable, without status epilepticus: Secondary | ICD-10-CM | POA: Diagnosis not present

## 2021-10-15 ENCOUNTER — Other Ambulatory Visit: Payer: Self-pay

## 2021-10-15 ENCOUNTER — Other Ambulatory Visit: Payer: Medicare HMO | Admitting: Primary Care

## 2021-10-15 DIAGNOSIS — R296 Repeated falls: Secondary | ICD-10-CM

## 2021-10-15 DIAGNOSIS — D496 Neoplasm of unspecified behavior of brain: Secondary | ICD-10-CM

## 2021-10-15 DIAGNOSIS — Z515 Encounter for palliative care: Secondary | ICD-10-CM

## 2021-10-15 DIAGNOSIS — R569 Unspecified convulsions: Secondary | ICD-10-CM

## 2021-10-15 NOTE — Progress Notes (Signed)
[facility] Consult Note Telephone: [phone]  Fax: [phone]    Date of encounter: 10/15/21 4:07 PM PATIENT NAME: Frederick Drake [address] [place] [address]   [phone] (home)  DOB: Jul 22, 1961 (61) MRN: [id number] PRIMARY CARE PROVIDER:    Leonel Ramsay, MD,  [place] [address] [phone]  REFERRING PROVIDER:   Leonel Ramsay, MD [place],  [place] [address] [phone]  RESPONSIBLE PARTY:    Contact Information     Name Relation Home Work Mobile   [place] Spouse   [place] Daughter [phone]  [phone]      [name] Palliative Care was asked to follow this patient by consultation request of  Leonel Ramsay, MD to address advance care planning and complex medical decision making. This is a follow up visit.                                   ASSESSMENT AND PLAN / RECOMMENDATIONS:   Advance Care Planning/Goals of Care: Goals include to maximize quality of life and symptom management. Patient/health care surrogate gave his/her permission to discuss.Our advance care planning conversation included a discussion about:     Exploration of personal, cultural or spiritual beliefs that might influence medical decisions  Exploration of goals of care in the event of a sudden injury or illness  Identification of a healthcare agent  Review of an  advance directive document  Code Status: FULL CODE  Symptom Management/Plan:  Seizure: Wife states pt has a seizure on 09/19/21 and so she cancelled 09/20/21 for him to see neurology due to not being ambulatory after the seizure. Has had PT finished from [facility]. Has been taking depakote once a daily instead of BID.  We discussed it was prescribed twice a day. I have asked her to f/u again with neurology as he is low on depakote with no  refills. Wife endorses agitated behavior postictal. She should reach out and make f/u appt with Kerry Dory PA.  Mobility: Endorses fear of falling and LE pain. This limits his mobility. Has not had falls for a while, using walker. Wife reported a seizure whereby he fell from standing.  Cognition: very poor Short term memory. Cannot remember phone calls, time line of illness, defers to wife for history.Endorses  regular continued alcohol use.  SDOH: limited income and no transportation. Have to use [employer]. Food stamps are $137 / month. Discussed mow or stone soup menus. Gave numbers to [name] for  [employer] and [employer]. Has [employer] benefits which will pay for some OTC monthly and other supplies.  Follow up Palliative Care Visit: Palliative care will continue to follow for complex medical decision making, advance care planning, and clarification of goals. Return 8 weeks or prn.  I spent 60 minutes providing this consultation. More than 50% of the time in this consultation was spent in counseling and care coordination.  PPS: 40%  HOSPICE ELIGIBILITY/DIAGNOSIS: TBD  Chief Complaint: debility, memory loss, seizures  HISTORY OF PRESENT ILLNESS:  Frederick Drake is a 61 y.o. year old male  with COPD, seizures, self care deficits, immobility and fall risks .   History obtained from review of EMR, discussion with primary team, and interview with family, facility staff/caregiver and/or Mr. Kepner.  I reviewed available labs, medications, imaging, studies and related documents from the EMR.  Records reviewed and summarized above.   ROS  General: NAD EYES: denies vision changes ENMT: denies dysphagia Cardiovascular: denies chest pain, denies DOE Pulmonary: denies cough, denies increased SOB, daily smoker Abdomen: endorses good appetite, denies constipation, endorses continence of bowel GU: denies dysuria, endorses continence of urine MSK:  denies  increased weakness,  + falls reported Skin:  denies rashes or wounds Neurological: denies pain, denies insomnia Psych: Endorses positive mood, endorses memory loss Heme/lymph/immuno: denies bruises, abnormal bleeding  Physical Exam: Current and past weights:stable  Constitutional: NAD General: frail appearing, WNWD EYES: anicteric sclera, lids intact, no discharge  ENMT: intact hearing, oral mucous membranes moist CV:  no LE edema Pulmonary: no increased work of breathing, no cough, room air Abdomen: intake 100%, no ascites GU: deferred MSK: min sarcopenia, moves all extremities, ambulatory with walker  Skin: warm and dry, no rashes or wounds on visible skin Neuro:  + generalized weakness,  + cognitive impairment Psych: non-anxious affect, A and O x 2-3, short term memory loss Hem/lymph/immuno: no widespread bruising  Thank you for the opportunity to participate in the care of Mr. Selders.  The palliative care team will continue to follow. Please call our office at [phone] if we can be of additional assistance.   Jason Coop, NP DNP, AGPCNP-BC  COVID-19 PATIENT SCREENING TOOL Asked and negative response unless otherwise noted:   Have you had symptoms of covid, tested positive or been in contact with someone with symptoms/positive test in the past 5-10 days?

## 2021-12-02 ENCOUNTER — Other Ambulatory Visit: Payer: Medicare HMO | Admitting: Primary Care

## 2021-12-02 DIAGNOSIS — M79604 Pain in right leg: Secondary | ICD-10-CM

## 2021-12-02 DIAGNOSIS — R4701 Aphasia: Secondary | ICD-10-CM

## 2021-12-02 DIAGNOSIS — M79605 Pain in left leg: Secondary | ICD-10-CM | POA: Diagnosis not present

## 2021-12-02 DIAGNOSIS — R4189 Other symptoms and signs involving cognitive functions and awareness: Secondary | ICD-10-CM | POA: Diagnosis not present

## 2021-12-02 DIAGNOSIS — R270 Ataxia, unspecified: Secondary | ICD-10-CM | POA: Diagnosis not present

## 2021-12-02 DIAGNOSIS — R569 Unspecified convulsions: Secondary | ICD-10-CM

## 2021-12-02 NOTE — Progress Notes (Signed)
? ? ?[facility] ?[facility] Palliative [facility] Consult Note ?Telephone: [phone]  ?Fax: [phone]  ? ? ?Date of encounter: 12/02/21 ?3:38 PM ?PATIENT NAME: Frederick Drake ?[place] [address]   ?[phone] (home)  ?DOB: October 16, 1960 ?MRN: [id number] ?PRIMARY CARE PROVIDER:    ?Leonel Ramsay, MD,  ?[place] ?[place] [address] ?[phone] ? ?REFERRING PROVIDER:   ?Leonel Ramsay, MD ?[address] ?[place],  [place] [address] ?[phone] ? ?RESPONSIBLE PARTY:    ?Contact Information   ? ? Name Relation Home Work Mobile  ? Beren, Yniguez Spouse   [phone]  ? Luna Kitchens Daughter [phone]  [phone]  ? ?  ? ? ?[name] Palliative Care was asked to follow this patient by consultation request of  Leonel Ramsay, MD to address advance care planning and complex medical decision making. This is a follow up visit. ? ?                                 ASSESSMENT AND PLAN / RECOMMENDATIONS:  ? ?Advance Care Planning/Goals of Care: Goals include to maximize quality of life and symptom management. Patient/health care surrogate gave his/her permission to discuss.Our advance care planning conversation included a discussion about:    ? ?Exploration of personal, cultural or spiritual beliefs that might influence medical decisions  ?Exploration of goals of care in the event of a sudden injury or illness  ?CODE STATUS: FULL CODE ? ?Symptom Management/Plan: ?I met with patient and his wife in their home. He seems to be at his baseline.  ?Mentation: This would include moderate cognitive impairment. His short term memory even within the same conversation is very limited. He endorses continuing to use alcohol regularly. ?SDOH: He was not able to make his last Neuro appointment due to  lack of transportation but his insurance is apparently working out transportation. I encouraged his wife to  call the number on the brochure and find out what services they could supply. Apparently someone called while his wife was out and he doesn't remember who and wrote the number down wrong. He really is not reliable for information or messages and I encouraged her to perhaps use a different phone number on medical documents so that he does not miss appointments.   ?Falls: He denies falls and states that he has generally been feeling well. He is looking forward to getting out of the house to a doctors appointment stating he has not left the house for many months ? ?Skin integrity: Has several healing abrasions from self administering ace bandage too tight; recommended mod compression sock, and reviewed proper technique to wrap LE. Appears to be healing without issue. ? ?Follow up Palliative Care Visit: Palliative care will continue to follow for complex medical decision making, advance care planning, and clarification of goals. Return 8 weeks or prn. ? ?This visit was coded based on medical decision making (MDM). ? ?PPS: 40% ? ?HOSPICE ELIGIBILITY/DIAGNOSIS: no ? ?Chief Complaint: seizure disorder ? ?HISTORY OF PRESENT ILLNESS:  Frederick Drake is a 61 y.o. year old male  with Seizures , moderate cognitive impairment. Patient seen today to review palliative care needs to include medical decision making and advance care planning as appropriate.  ? ?History obtained from review of EMR, discussion with primary team, and interview with family, facility staff/caregiver and/or [name].  ?I reviewed available labs, medications, imaging, studies and related documents from the EMR.  Records reviewed and summarized above.  ? ?ROS ? ?General: NAD ?EYES: denies vision changes ?ENMT: denies dysphagia ?Cardiovascular: denies chest pain, denies DOE ?Pulmonary: denies cough, denies increased SOB ?Abdomen: endorses good appetite, denies constipation, endorses continence of bowel ?GU: denies dysuria, endorses continence of urine ?MSK:   denies  increased weakness,  several  falls reported ?Skin: denies rashes or wounds ?Neurological: endorses L LE pain, denies insomnia ?Psych: Endorses positive mood ? ?Physical Exam: ?Current and past weights: 200 lbs ?Constitutional: NAD ?General: frail appearing, WNWD ?EYES: anicteric sclera, lids intact, no discharge  ?ENMT: intact hearing, oral mucous membranes moist, dentition missing ?CV: S1S2, RRR, no LE edema ?Pulmonary:   increased work of breathing, ++  cough, room air ?Abdomen: intake 100%, soft and non tender, no ascites ?MSK: no sarcopenia, moves all extremities, ambulatory with walker  ?Skin: warm and dry, no rashes or wounds on visible skin ?Neuro:  + generalized weakness,  ++ cognitive impairment, non-anxious affect ? ? ?Thank you for the opportunity to participate in the care of Frederick Drake.  The palliative care team will continue to follow. Please call our office at [phone] if we can be of additional assistance.  ? ?Jason Coop, NP DNP, AGPCNP-BC ? ?COVID-19 PATIENT SCREENING TOOL ?Asked and negative response unless otherwise noted:  ? ?Have you had symptoms of covid, tested positive or been in contact with someone with symptoms/positive test in the past 5-10 days?  ? ?

## 2021-12-05 ENCOUNTER — Other Ambulatory Visit: Payer: Medicare HMO | Admitting: Primary Care

## 2021-12-13 ENCOUNTER — Other Ambulatory Visit: Payer: Self-pay

## 2021-12-13 DIAGNOSIS — M25562 Pain in left knee: Secondary | ICD-10-CM | POA: Diagnosis not present

## 2021-12-13 DIAGNOSIS — E1142 Type 2 diabetes mellitus with diabetic polyneuropathy: Secondary | ICD-10-CM | POA: Diagnosis not present

## 2021-12-13 DIAGNOSIS — R569 Unspecified convulsions: Secondary | ICD-10-CM | POA: Diagnosis not present

## 2021-12-13 DIAGNOSIS — Z8669 Personal history of other diseases of the nervous system and sense organs: Secondary | ICD-10-CM | POA: Diagnosis not present

## 2021-12-13 DIAGNOSIS — G8929 Other chronic pain: Secondary | ICD-10-CM | POA: Diagnosis not present

## 2021-12-13 DIAGNOSIS — Z86018 Personal history of other benign neoplasm: Secondary | ICD-10-CM | POA: Diagnosis not present

## 2021-12-13 DIAGNOSIS — R2689 Other abnormalities of gait and mobility: Secondary | ICD-10-CM | POA: Diagnosis not present

## 2021-12-13 DIAGNOSIS — Z79899 Other long term (current) drug therapy: Secondary | ICD-10-CM | POA: Diagnosis not present

## 2021-12-13 DIAGNOSIS — E559 Vitamin D deficiency, unspecified: Secondary | ICD-10-CM | POA: Diagnosis not present

## 2021-12-13 MED ORDER — LAMOTRIGINE 100 MG PO TABS: 100.0000 mg | ORAL_TABLET | Freq: Two times a day (BID) | ORAL | 5 refills | Status: DC

## 2021-12-13 MED ORDER — ERGOCALCIFEROL 1.25 MG (50000 UT) PO CAPS: ORAL_CAPSULE | ORAL | 0 refills | Status: DC

## 2021-12-16 ENCOUNTER — Other Ambulatory Visit: Payer: Self-pay

## 2021-12-16 MED ORDER — GABAPENTIN 300 MG PO CAPS: ORAL_CAPSULE | ORAL | 5 refills | Status: DC

## 2022-02-24 ENCOUNTER — Other Ambulatory Visit: Payer: Medicare HMO | Admitting: Primary Care

## 2022-03-05 ENCOUNTER — Other Ambulatory Visit: Payer: Medicare HMO | Admitting: Primary Care

## 2022-03-05 DIAGNOSIS — M79605 Pain in left leg: Secondary | ICD-10-CM

## 2022-03-05 DIAGNOSIS — D496 Neoplasm of unspecified behavior of brain: Secondary | ICD-10-CM | POA: Diagnosis not present

## 2022-03-05 DIAGNOSIS — Z515 Encounter for palliative care: Secondary | ICD-10-CM

## 2022-03-05 DIAGNOSIS — M79604 Pain in right leg: Secondary | ICD-10-CM | POA: Diagnosis not present

## 2022-03-05 DIAGNOSIS — R270 Ataxia, unspecified: Secondary | ICD-10-CM | POA: Diagnosis not present

## 2022-03-05 NOTE — Progress Notes (Signed)
[facility] Consult Note Telephone: [phone]  Fax: [phone]    Date of encounter: 03/05/22 1:48 PM PATIENT NAME: Frederick Drake [address] [place] [address]   [phone] (home)  DOB: [birth year] MRN: [id number] PRIMARY CARE PROVIDER:    Leonel Ramsay, MD,  [place] [address] [phone]  REFERRING PROVIDER:   Leonel Ramsay, MD [place],  [place] [address] [phone]  RESPONSIBLE PARTY:    Contact Information     Name Relation Home Work Mobile   Frederick Drake Spouse   Frederick Drake Daughter [phone]  [phone]       [name] Palliative Care was asked to follow this patient by consultation request of  Frederick Ramsay, MD to address advance care planning and complex medical decision making. This is a follow up visit.                                   ASSESSMENT AND PLAN / RECOMMENDATIONS:   Advance Care Planning/Goals of Care: Goals include to maximize quality of life and symptom management. Patient/health care surrogate gave his/her permission to discuss.Our advance care planning conversation included a discussion about:    The value and importance of advance care planning  Exploration of personal, cultural or spiritual beliefs that might influence medical decisions  Exploration of goals of care in the event of a sudden injury or illness  Identification of a healthcare agent -wife Review and updating of an  advance directive document . CODE STATUS: FULL  I completed a MOST form today. The patient and family outlined their wishes for the following treatment decisions:  Cardiopulmonary Resuscitation: Attempt Resuscitation (CPR)  Medical Interventions: Limited Additional Interventions: Use medical treatment, IV fluids and cardiac monitoring as indicated, DO NOT USE  intubation or mechanical ventilation. May consider use of less invasive airway support such as BiPAP or CPAP. Also provide comfort measures. Transfer to the hospital if indicated. Avoid intensive care.   Antibiotics: Antibiotics if indicated  IV Fluids: IV fluids for a defined trial period  Feeding Tube: Feeding tube for a defined trial period   Symptom Management/Plan:  Seizures: States seizure activities, wife endorses several focal seizures with agitation afterwards. Is open to medication changes. Last scan 1 year ago. Currently taking just Lamictal. Also reports ringing or vibrations in the center of his head, involving his hearing. He states this has gone on for years. For neuro f/u in 1 month.  Knee pain: Endorses knee/calf pain. Examined grossly, no abnormalities noted in calf. Does have an abnormal gait with external rotation of L foot he attributes to a football injury. Recommend tylenol ATC, has 500 mg in home. I have sent Voltaren 1% to bid use. He is also going to try lidoderm patch  4% to his knee. Endorses doing some HEP.  Falls: Has fallen some, can get back up. Uses walker, for balance.  Endorses tinnitus but not dizziness. Endorses balance issues. No labs in a year on the record. Endorses  blurred vision on R eye but does not want to pursue ophthalmology.  Follow up Palliative Care Visit: Palliative care will continue to follow for complex medical decision making, advance care planning, and clarification of goals. Return 8 weeks or prn.  I spent 60 minutes providing  this consultation. More than 50% of the time in this consultation was spent in counseling and care coordination.  PPS: 50%  HOSPICE ELIGIBILITY/DIAGNOSIS: TBD  Chief Complaint: instability, frequent falls, seizure activity.  HISTORY OF PRESENT ILLNESS:  Frederick Drake is a 61 y.o. year old male  with h/o meningiomas, absence seizures, agitation, memory loss, frequent falls, AUD, tobacco use disorder. Patient seen  today to review palliative care needs to include medical decision making and advance care planning as appropriate.   History obtained from review of EMR, discussion with primary team, and interview with family, facility staff/caregiver and/or Frederick Drake.  I reviewed available labs, medications, imaging, studies and related documents from the EMR.  Records reviewed and summarized above.   ROS   General: NAD EYES: endorses  vision changes ENMT: denies dysphagia Cardiovascular: denies chest pain, denies DOE Pulmonary: denies cough, denies increased SOB Abdomen: endorses good appetite, denies constipation, endorses continence of bowel GU: denies dysuria, endorses continence of urine MSK:  endorses increased weakness,  + falls reported Skin: denies rashes or wounds Neurological: endorses L Leg pain, denies insomnia, endorses poor balance Psych: Endorses positive mood, endorses memory loss  Physical Exam: Current and past weights: unavailable Constitutional: NAD General: frail appearing, WNWD EYES: anicteric sclera, lids intact, no discharge  ENMT: intact hearing, oral mucous membranes moist CV:  no LE edema Pulmonary: no increased work of breathing, no cough, room air Abdomen: intake 100%, no ascites MSK: + sarcopenia, moves all extremities, ambulatory with walker Skin: warm and dry, no rashes or wounds on visible skin Neuro:  + generalized weakness,  +cognitive impairment, non-anxious affect  Thank you for the opportunity to participate in the care of Frederick Drake.  The palliative care team will continue to follow. Please call our office at [phone] if we can be of additional assistance.   Frederick Coop, NP DNP, AGPCNP-BC  COVID-19 PATIENT SCREENING TOOL Asked and negative response unless otherwise noted:   Have you had symptoms of covid, tested positive or been in contact with someone with symptoms/positive test in the past 5-10 days?

## 2022-05-06 ENCOUNTER — Other Ambulatory Visit: Payer: Medicare HMO | Admitting: Primary Care

## 2022-05-06 DIAGNOSIS — R270 Ataxia, unspecified: Secondary | ICD-10-CM | POA: Diagnosis not present

## 2022-05-06 DIAGNOSIS — M79605 Pain in left leg: Secondary | ICD-10-CM

## 2022-05-06 DIAGNOSIS — M79604 Pain in right leg: Secondary | ICD-10-CM | POA: Diagnosis not present

## 2022-05-06 DIAGNOSIS — Z515 Encounter for palliative care: Secondary | ICD-10-CM

## 2022-05-06 DIAGNOSIS — R4189 Other symptoms and signs involving cognitive functions and awareness: Secondary | ICD-10-CM | POA: Diagnosis not present

## 2022-05-06 NOTE — Progress Notes (Signed)
[facility] Palliative Care Consult Note Telephone: [phone]  Fax: [phone]    Date of encounter: 05/06/22 2:54 PM PATIENT NAME: Frederick Drake [address] [place] [address]   [phone] (home)  DOB: 16-Oct-1960 MRN: [id number] PRIMARY CARE PROVIDER:    Leonel Ramsay, MD,  [place] [address] [phone]  REFERRING PROVIDER:   Leonel Ramsay, MD [place],  [place] [address] [phone]  RESPONSIBLE PARTY:    Contact Information     Name Relation Home Work Mobile   Frederick Drake Spouse   Frederick Drake Frederick Drake [phone]  [phone]        [name] Palliative Care was asked to follow this patient by consultation request of  Frederick Ramsay, MD to address advance care planning and complex medical decision making. This is a follow up visit.                                   ASSESSMENT AND PLAN / RECOMMENDATIONS:   Advance Care Planning/Goals of Care: Goals include to maximize quality of life and symptom management. Patient/health care surrogate gave his/her permission to discuss.Our advance care planning conversation included a discussion about:    The value and importance of advance care planning  Experiences with loved ones who have been seriously ill or have died  Exploration of personal, cultural or spiritual beliefs that might influence medical decisions  Exploration of goals of care in the event of a sudden injury or illness  Identification of a healthcare agent  Review and updating or creation of an  advance directive document . Decision not to resuscitate or to de-escalate disease focused treatments due to poor prognosis. CODE STATUS:  Symptom Management/Plan:  Cardiac: Has reported several chest pain episodes. Smokes quite a bit during the day, 2-3 packs. Has run out of bp meds.  I have urged them to make PCP appt and get connected with medications, labs, wellness etc.  Seizure: Lamictal and gabapentin, has not run out. Endorses a seizure this AM with agitated in post ictal period. Asked to follow with  neurology if these persist, wife not sure how often they are to be seen.  Pain; has been taking gabapentin 600 mg tid with good Peripheral neuropathic improvement. May need prescription as I did increase it from 300 mg tid per neurology, as a trial.   Eye Exam: has not had an eye exam.  Needs a referral. States no exam in his life. Endorses loss of vision in R eye in last month, no abnormalities on gross exam.  Hearing: Endorsed difficulty in hearing in L ear and tinnitus. Needs in -office check and possible referral.I asked him to start some cerumen softening drops so PCP can extract.  Follow up Palliative Care Visit: Palliative care will continue to follow for complex medical decision making, advance care planning, and clarification of goals. Return 12 weeks or prn.  I spent 60 minutes providing this consultation. More than 50% of the time in this consultation was spent in counseling and care coordination.  This visit was coded based on medical decision making (MDM).  PPS: 40%  HOSPICE ELIGIBILITY/DIAGNOSIS: TBD  Chief Complaint: seizure disorder  HISTORY OF PRESENT ILLNESS:  Frederick Drake is a 61 y.o. year old male  with htn,  seizures  . Patient seen today to review palliative care needs to include medical decision making and advance care planning as appropriate.   History obtained from review of EMR, discussion with primary team, and interview with family, facility staff/caregiver and/or Frederick Drake.  I reviewed available labs, medications, imaging, studies and related documents from the EMR.  Records reviewed and summarized above.   ROS  General: NAD EYES:  endorses recent vision changes ENMT: denies dysphagia, endorses tinnitis, L ear hearing  loss Cardiovascular: endorses some  chest pain, endorses DOE Pulmonary: denies cough, denies increased SOB, endorses 2-3 ppd smoking Abdomen: endorses good appetite, denies constipation, endorses continence of bowel GU: denies dysuria, endorses continence of urine MSK:  denies  increased weakness,  no falls reported Skin: denies rashes or wounds Neurological: denies pain, denies insomnia, endorses seizures  Psych: Endorses positive mood  Physical Exam: Current and past weights: stable per report Constitutional: NAD, 150/80 HR 78 RR 18 General: frail appearing, WNWD EYES: anicteric sclera, lids intact, no discharge  ENMT: hard of hearing, oral mucous membranes moist CV: S1S2, RRR, no LE edema Pulmonary: no increased work of breathing, no cough, room air Abdomen: intake 100%, soft and non tender, no ascites MSK: no sarcopenia, moves all extremities, ambulatory with assistance Skin: warm and dry, no rashes or wounds on visible skin Neuro:  generalized weakness,  severe  cognitive impairment, non-anxious affect   Thank you for the opportunity to participate in the care of Frederick Drake.  The palliative care team will continue to follow. Please call our office at [phone] if we can be of additional assistance.   Frederick Coop, NP DNP, AGPCNP-BC  COVID-19 PATIENT SCREENING TOOL Asked and negative response unless otherwise noted:   Have you had symptoms of covid, tested positive or been in contact with someone with symptoms/positive test in the past 5-10 days?

## 2022-06-03 ENCOUNTER — Other Ambulatory Visit (HOSPITAL_COMMUNITY): Payer: Self-pay | Admitting: Infectious Diseases

## 2022-06-03 ENCOUNTER — Other Ambulatory Visit: Payer: Self-pay | Admitting: Infectious Diseases

## 2022-06-03 DIAGNOSIS — Z1212 Encounter for screening for malignant neoplasm of rectum: Secondary | ICD-10-CM | POA: Diagnosis not present

## 2022-06-03 DIAGNOSIS — Z23 Encounter for immunization: Secondary | ICD-10-CM | POA: Diagnosis not present

## 2022-06-03 DIAGNOSIS — R569 Unspecified convulsions: Secondary | ICD-10-CM | POA: Diagnosis not present

## 2022-06-03 DIAGNOSIS — Z125 Encounter for screening for malignant neoplasm of prostate: Secondary | ICD-10-CM | POA: Diagnosis not present

## 2022-06-03 DIAGNOSIS — R69 Illness, unspecified: Secondary | ICD-10-CM | POA: Diagnosis not present

## 2022-06-03 DIAGNOSIS — Z720 Tobacco use: Secondary | ICD-10-CM | POA: Diagnosis not present

## 2022-06-03 DIAGNOSIS — Z0000 Encounter for general adult medical examination without abnormal findings: Secondary | ICD-10-CM | POA: Diagnosis not present

## 2022-06-03 DIAGNOSIS — R9389 Abnormal findings on diagnostic imaging of other specified body structures: Secondary | ICD-10-CM | POA: Diagnosis not present

## 2022-06-03 DIAGNOSIS — I10 Essential (primary) hypertension: Secondary | ICD-10-CM | POA: Diagnosis not present

## 2022-06-03 DIAGNOSIS — R2681 Unsteadiness on feet: Secondary | ICD-10-CM | POA: Diagnosis not present

## 2022-06-03 DIAGNOSIS — J449 Chronic obstructive pulmonary disease, unspecified: Secondary | ICD-10-CM | POA: Diagnosis not present

## 2022-06-03 DIAGNOSIS — Z1211 Encounter for screening for malignant neoplasm of colon: Secondary | ICD-10-CM | POA: Diagnosis not present

## 2022-06-10 ENCOUNTER — Telehealth: Payer: Self-pay

## 2022-06-10 NOTE — Telephone Encounter (Signed)
905 am.  Incoming call from wife [name].  She is needing assistance with weatherization application and cologuard kit.  Update provided to Ralene Bathe, NP.  This RN will complete a visit with patient/wife tomorrow at 9 am to assist.

## 2022-06-11 ENCOUNTER — Other Ambulatory Visit: Payer: Medicare HMO

## 2022-06-11 DIAGNOSIS — Z515 Encounter for palliative care: Secondary | ICD-10-CM

## 2022-06-11 NOTE — Progress Notes (Signed)
PATIENT NAME: Frederick Drake DOB: [birth year] MRN: [id number]  PRIMARY CARE PROVIDER: Leonel Ramsay, MD  RESPONSIBLE PARTY:  Acct ID - Guarantor Home Phone Work Phone Relationship Acct Type  [id number] - Hesketh,SHERM* [phone]  Self P/F     [name], [name]    Home visit completed to provide education on cologuard kit.  Reviewed directions with patient/wife and both verbalized understanding.  They are not able to schedule a pick-up on the internet but will call the number listed for assistance.  Weatherization application reviewed.  Completed as much as could be done today.  Will have Palliative Care SW follow up with wife [name].  Discussed need for MOW with [name], SW and patient will be added to the list.         [name], RN

## 2022-06-12 DIAGNOSIS — Z1211 Encounter for screening for malignant neoplasm of colon: Secondary | ICD-10-CM | POA: Diagnosis not present

## 2022-06-12 DIAGNOSIS — Z1212 Encounter for screening for malignant neoplasm of rectum: Secondary | ICD-10-CM | POA: Diagnosis not present

## 2022-06-13 ENCOUNTER — Ambulatory Visit
Admission: RE | Admit: 2022-06-13 | Discharge: 2022-06-13 | Disposition: A | Discharge status: Home / Self Care | Payer: Medicare HMO | Source: Ambulatory Visit | Attending: Infectious Diseases | Admitting: Infectious Diseases

## 2022-06-13 DIAGNOSIS — R9389 Abnormal findings on diagnostic imaging of other specified body structures: Secondary | ICD-10-CM | POA: Diagnosis not present

## 2022-06-23 ENCOUNTER — Telehealth: Payer: Self-pay

## 2022-06-23 NOTE — Telephone Encounter (Signed)
PC SW outreached patients spouse, Lynelle Smoke, per [facility] RN request to f/u on westernization application that spouse is working on address any questions. Spouse shared that she does not have the application in front of her at the moment and could not remember what sections of the application she had questions about.   SW provided souse her contact info for spouse to outreach SW when ready to review/discuss the application.

## 2022-06-24 DIAGNOSIS — J189 Pneumonia, unspecified organism: Secondary | ICD-10-CM | POA: Diagnosis not present

## 2022-06-24 DIAGNOSIS — R0609 Other forms of dyspnea: Secondary | ICD-10-CM | POA: Diagnosis not present

## 2022-06-24 DIAGNOSIS — R059 Cough, unspecified: Secondary | ICD-10-CM | POA: Diagnosis not present

## 2022-06-24 DIAGNOSIS — R69 Illness, unspecified: Secondary | ICD-10-CM | POA: Diagnosis not present

## 2022-06-24 DIAGNOSIS — F17218 Nicotine dependence, cigarettes, with other nicotine-induced disorders: Secondary | ICD-10-CM | POA: Diagnosis not present

## 2022-07-17 DIAGNOSIS — M25562 Pain in left knee: Secondary | ICD-10-CM | POA: Diagnosis not present

## 2022-07-17 DIAGNOSIS — R569 Unspecified convulsions: Secondary | ICD-10-CM | POA: Diagnosis not present

## 2022-07-17 DIAGNOSIS — G8929 Other chronic pain: Secondary | ICD-10-CM | POA: Diagnosis not present

## 2022-07-17 DIAGNOSIS — E1142 Type 2 diabetes mellitus with diabetic polyneuropathy: Secondary | ICD-10-CM | POA: Diagnosis not present

## 2022-07-17 DIAGNOSIS — E559 Vitamin D deficiency, unspecified: Secondary | ICD-10-CM | POA: Diagnosis not present

## 2022-09-03 DIAGNOSIS — I10 Essential (primary) hypertension: Secondary | ICD-10-CM | POA: Diagnosis not present

## 2022-09-03 DIAGNOSIS — G629 Polyneuropathy, unspecified: Secondary | ICD-10-CM | POA: Diagnosis not present

## 2022-09-03 DIAGNOSIS — R9389 Abnormal findings on diagnostic imaging of other specified body structures: Secondary | ICD-10-CM | POA: Diagnosis not present

## 2022-09-03 DIAGNOSIS — J449 Chronic obstructive pulmonary disease, unspecified: Secondary | ICD-10-CM | POA: Diagnosis not present

## 2022-09-03 DIAGNOSIS — Z720 Tobacco use: Secondary | ICD-10-CM | POA: Diagnosis not present

## 2022-09-03 DIAGNOSIS — R569 Unspecified convulsions: Secondary | ICD-10-CM | POA: Diagnosis not present

## 2022-09-05 ENCOUNTER — Other Ambulatory Visit: Payer: Self-pay | Admitting: Pulmonary Disease

## 2022-09-05 DIAGNOSIS — E1142 Type 2 diabetes mellitus with diabetic polyneuropathy: Secondary | ICD-10-CM | POA: Diagnosis not present

## 2022-09-05 DIAGNOSIS — Z86018 Personal history of other benign neoplasm: Secondary | ICD-10-CM | POA: Diagnosis not present

## 2022-09-05 DIAGNOSIS — R2689 Other abnormalities of gait and mobility: Secondary | ICD-10-CM | POA: Diagnosis not present

## 2022-09-05 DIAGNOSIS — R569 Unspecified convulsions: Secondary | ICD-10-CM | POA: Diagnosis not present

## 2022-09-05 DIAGNOSIS — R918 Other nonspecific abnormal finding of lung field: Secondary | ICD-10-CM

## 2022-09-09 ENCOUNTER — Other Ambulatory Visit: Payer: Self-pay | Admitting: Student

## 2022-09-09 DIAGNOSIS — R569 Unspecified convulsions: Secondary | ICD-10-CM

## 2022-09-11 ENCOUNTER — Ambulatory Visit
Admission: RE | Admit: 2022-09-11 | Discharge: 2022-09-11 | Disposition: A | Discharge status: Home / Self Care | Payer: Medicare HMO | Source: Ambulatory Visit | Attending: Pulmonary Disease | Admitting: Pulmonary Disease

## 2022-09-11 VITALS — Ht 72.0 in | Wt 181.0 lb

## 2022-09-11 DIAGNOSIS — I700 Atherosclerosis of aorta: Secondary | ICD-10-CM | POA: Insufficient documentation

## 2022-09-11 DIAGNOSIS — R590 Localized enlarged lymph nodes: Secondary | ICD-10-CM | POA: Diagnosis not present

## 2022-09-11 DIAGNOSIS — F17210 Nicotine dependence, cigarettes, uncomplicated: Secondary | ICD-10-CM | POA: Insufficient documentation

## 2022-09-11 DIAGNOSIS — J90 Pleural effusion, not elsewhere classified: Secondary | ICD-10-CM | POA: Diagnosis not present

## 2022-09-11 DIAGNOSIS — M549 Dorsalgia, unspecified: Secondary | ICD-10-CM | POA: Diagnosis not present

## 2022-09-11 DIAGNOSIS — I2510 Atherosclerotic heart disease of native coronary artery without angina pectoris: Secondary | ICD-10-CM | POA: Diagnosis not present

## 2022-09-11 DIAGNOSIS — R918 Other nonspecific abnormal finding of lung field: Secondary | ICD-10-CM | POA: Insufficient documentation

## 2022-09-11 DIAGNOSIS — R69 Illness, unspecified: Secondary | ICD-10-CM | POA: Diagnosis not present

## 2022-09-11 LAB — GLUCOSE, CAPILLARY: Glucose-Capillary: 115 mg/dL — ABNORMAL HIGH (ref 70–99)

## 2022-09-11 MED ORDER — FLUDEOXYGLUCOSE F - 18 (FDG) INJECTION
9.4000 | Freq: Once | INTRAVENOUS | Status: AC | PRN
  Administered 2022-09-11: 10.23 via INTRAVENOUS

## 2022-09-17 ENCOUNTER — Other Ambulatory Visit: Payer: Disability Insurance

## 2022-09-18 ENCOUNTER — Encounter
Admission: RE | Admit: 2022-09-18 | Discharge: 2022-09-18 | Disposition: A | Discharge status: Home / Self Care | Payer: Disability Insurance | Source: Ambulatory Visit | Attending: Pulmonary Disease | Admitting: Pulmonary Disease

## 2022-09-18 DIAGNOSIS — Z01810 Encounter for preprocedural cardiovascular examination: Secondary | ICD-10-CM

## 2022-09-18 DIAGNOSIS — Z01818 Encounter for other preprocedural examination: Secondary | ICD-10-CM

## 2022-09-18 HISTORY — DX: Benign neoplasm of meninges, unspecified: D32.9

## 2022-09-18 HISTORY — DX: Type 2 diabetes mellitus without complications: E11.9

## 2022-09-18 HISTORY — DX: Acute respiratory failure with hypoxia: J96.01

## 2022-09-18 HISTORY — DX: Alcohol abuse, uncomplicated: F10.10

## 2022-09-18 HISTORY — DX: Encephalopathy, unspecified: G93.40

## 2022-09-18 HISTORY — DX: Dyspnea, unspecified: R06.00

## 2022-09-18 HISTORY — DX: Anemia, unspecified: D64.9

## 2022-09-18 HISTORY — DX: Chronic obstructive pulmonary disease, unspecified: J44.9

## 2022-09-18 HISTORY — DX: Other symptoms and signs involving cognitive functions and awareness: R41.89

## 2022-09-18 NOTE — Patient Instructions (Addendum)
Your procedure is scheduled on:09-26-22 Friday Report to the Registration Desk on the 1st floor of the [facility].Then proceed to the 2nd floor Surgery Desk To find out your arrival time, please call [phone] between 1PM - 3PM on:09-25-22 Thursday If your arrival time is 6:00 am, do not arrive prior to that time as the [facility] entrance doors do not open until 6:00 am.  REMEMBER: Instructions that are not followed completely may result in serious medical risk, up to and including death; or upon the discretion of your surgeon and anesthesiologist your surgery may need to be rescheduled.  Do not eat food OR drink any liquids after midnight the night before surgery.  No gum chewing, lozengers or hard candies.  TAKE THESE MEDICATIONS THE MORNING OF SURGERY WITH A SIP OF WATER: -gabapentin (NEURONTIN)  -lamoTRIgine (LAMICTAL)   Use your Trelegy Ellipta Inhaler at home the day of surgery  One week prior to surgery: Stop Anti-inflammatories (NSAIDS) such as Advil, Aleve, Ibuprofen, Motrin, Naproxen, Naprosyn and Aspirin based products such as Excedrin, Goodys Powder, BC Powder.You may however, continue to take Tylenol if needed for pain up until the day of surgery.  Stop ANY OVER THE COUNTER supplements/vitamins NOW (09-18-22) until after surgery (Vitamin D3)  No Alcohol for 24 hours before or after surgery.  No Smoking including e-cigarettes for 24 hours prior to surgery.  No chewable tobacco products for at least 6 hours prior to surgery.  No nicotine patches on the day of surgery.  Do not use any "recreational" drugs for at least a week prior to your surgery.  Please be advised that the combination of cocaine and anesthesia may have negative outcomes, up to and including death. If you test positive for cocaine, your surgery will be cancelled.  On the morning of surgery brush your teeth with toothpaste and water, you may rinse your mouth with mouthwash if you wish. Do not swallow  any toothpaste or mouthwash.  Do not wear jewelry, make-up, hairpins, clips or nail polish.  Do not wear lotions, powders, or perfumes.   Do not shave body from the neck down 48 hours prior to surgery just in case you cut yourself which could leave a site for infection.  Also, freshly shaved skin may become irritated if using the CHG soap.  Contact lenses, hearing aids and dentures may not be worn into surgery.  Do not bring valuables to the hospital. [facility] is not responsible for any missing/lost belongings or valuables.   Notify your doctor if there is any change in your medical condition (cold, fever, infection).  Wear comfortable clothing (specific to your surgery type) to the hospital.  After surgery, you can help prevent lung complications by doing breathing exercises.  Take deep breaths and cough every 1-2 hours. Your doctor may order a device called an Incentive Spirometer to help you take deep breaths. When coughing or sneezing, hold a pillow firmly against your incision with both hands. This is called "splinting." Doing this helps protect your incision. It also decreases belly discomfort.  If you are being admitted to the hospital overnight, leave your suitcase in the car. After surgery it may be brought to your room.  In case of increased patient census, it may be necessary for you, the patient, to continue your postoperative care within the Same Day Surgery department.  If you are being discharged the day of surgery, you will not be allowed to drive home. You will need a responsible individual to  drive you home and stay with you for 24 hours after surgery.   If you are taking public transportation, you will need to have a responsible adult (18 years or older) with you. Please confirm with your physician that it is acceptable to use public transportation.   Please call the [facility] Dept. at [phone] if you have any questions about these  instructions.  Surgery Visitation Policy:  Patients undergoing a surgery or procedure may have two family members or support persons with them as long as the person is not COVID-19 positive or experiencing its symptoms.   Due to an increase in RSV and influenza rates and associated hospitalizations, children ages 68 and under will not be able to visit patients in [facility]. Masks continue to be strongly recommended.

## 2022-09-22 ENCOUNTER — Ambulatory Visit
Admission: RE | Admit: 2022-09-22 | Discharge: 2022-09-22 | Disposition: A | Discharge status: Home / Self Care | Payer: Medicare HMO | Source: Ambulatory Visit | Attending: Student | Admitting: Student

## 2022-09-22 DIAGNOSIS — G9389 Other specified disorders of brain: Secondary | ICD-10-CM | POA: Diagnosis not present

## 2022-09-22 DIAGNOSIS — R569 Unspecified convulsions: Secondary | ICD-10-CM | POA: Diagnosis not present

## 2022-09-22 DIAGNOSIS — G319 Degenerative disease of nervous system, unspecified: Secondary | ICD-10-CM | POA: Diagnosis not present

## 2022-09-22 MED ORDER — GADOBUTROL 1 MMOL/ML IV SOLN
8.0000 mL | Freq: Once | INTRAVENOUS | Status: AC | PRN
  Administered 2022-09-22: 7.5 mL via INTRAVENOUS

## 2022-09-24 ENCOUNTER — Encounter
Admission: RE | Admit: 2022-09-24 | Discharge: 2022-09-24 | Disposition: A | Discharge status: Home / Self Care | Payer: Medicare HMO | Source: Ambulatory Visit | Attending: Pulmonary Disease | Admitting: Pulmonary Disease

## 2022-09-24 ENCOUNTER — Encounter: Payer: Self-pay | Admitting: Urgent Care

## 2022-09-24 ENCOUNTER — Inpatient Hospital Stay: Admission: RE | Admit: 2022-09-24 | Payer: Disability Insurance | Source: Ambulatory Visit

## 2022-09-24 DIAGNOSIS — Z1152 Encounter for screening for COVID-19: Secondary | ICD-10-CM | POA: Diagnosis not present

## 2022-09-24 DIAGNOSIS — Z20822 Contact with and (suspected) exposure to covid-19: Secondary | ICD-10-CM

## 2022-09-24 DIAGNOSIS — Z01818 Encounter for other preprocedural examination: Secondary | ICD-10-CM | POA: Diagnosis present

## 2022-09-24 DIAGNOSIS — Z01810 Encounter for preprocedural cardiovascular examination: Secondary | ICD-10-CM

## 2022-09-24 LAB — PROTIME-INR
INR: 1.1 (ref 0.8–1.2)
Prothrombin Time: 14.2 seconds (ref 11.4–15.2)

## 2022-09-25 LAB — SARS CORONAVIRUS 2 (TAT 6-24 HRS): SARS Coronavirus 2: NEGATIVE

## 2022-09-26 ENCOUNTER — Encounter: Payer: Self-pay | Admitting: Certified Registered"

## 2022-09-26 ENCOUNTER — Ambulatory Visit: Admission: RE | Admit: 2022-09-26 | Payer: Medicare HMO | Source: Home / Self Care

## 2022-09-26 ENCOUNTER — Encounter: Admission: RE | Payer: Self-pay | Source: Home / Self Care

## 2022-09-26 SURGERY — BRONCHOSCOPY, WITH EBUS
Anesthesia: General

## 2022-09-26 NOTE — Anesthesia Preprocedure Evaluation (Signed)
Anesthesia Evaluation    Airway        Dental   Pulmonary Current Smoker          Cardiovascular hypertension,      Neuro/Psych    GI/Hepatic   Endo/Other  diabetes    Renal/GU      Musculoskeletal   Abdominal   Peds  Hematology   Anesthesia Other Findings   Reproductive/Obstetrics                              Anesthesia Physical Anesthesia Plan Anesthesia Quick Evaluation

## 2022-10-03 DIAGNOSIS — N529 Male erectile dysfunction, unspecified: Secondary | ICD-10-CM | POA: Diagnosis not present

## 2022-10-03 DIAGNOSIS — R030 Elevated blood-pressure reading, without diagnosis of hypertension: Secondary | ICD-10-CM | POA: Diagnosis not present

## 2022-10-03 DIAGNOSIS — Z7951 Long term (current) use of inhaled steroids: Secondary | ICD-10-CM | POA: Diagnosis not present

## 2022-10-03 DIAGNOSIS — Z880 Allergy status to penicillin: Secondary | ICD-10-CM | POA: Diagnosis not present

## 2022-10-03 DIAGNOSIS — R69 Illness, unspecified: Secondary | ICD-10-CM | POA: Diagnosis not present

## 2022-10-03 DIAGNOSIS — Z803 Family history of malignant neoplasm of breast: Secondary | ICD-10-CM | POA: Diagnosis not present

## 2022-10-03 DIAGNOSIS — G40909 Epilepsy, unspecified, not intractable, without status epilepticus: Secondary | ICD-10-CM | POA: Diagnosis not present

## 2022-10-03 DIAGNOSIS — Z008 Encounter for other general examination: Secondary | ICD-10-CM | POA: Diagnosis not present

## 2022-10-03 DIAGNOSIS — E1142 Type 2 diabetes mellitus with diabetic polyneuropathy: Secondary | ICD-10-CM | POA: Diagnosis not present

## 2022-10-03 DIAGNOSIS — J449 Chronic obstructive pulmonary disease, unspecified: Secondary | ICD-10-CM | POA: Diagnosis not present

## 2022-10-03 DIAGNOSIS — Z9181 History of falling: Secondary | ICD-10-CM | POA: Diagnosis not present

## 2022-10-03 DIAGNOSIS — R32 Unspecified urinary incontinence: Secondary | ICD-10-CM | POA: Diagnosis not present

## 2022-10-20 DIAGNOSIS — R918 Other nonspecific abnormal finding of lung field: Secondary | ICD-10-CM | POA: Diagnosis not present

## 2022-10-28 DIAGNOSIS — R569 Unspecified convulsions: Secondary | ICD-10-CM | POA: Diagnosis not present

## 2022-10-29 ENCOUNTER — Other Ambulatory Visit: Payer: Self-pay

## 2022-10-29 ENCOUNTER — Other Ambulatory Visit: Payer: Self-pay | Admitting: Pulmonary Disease

## 2022-10-29 ENCOUNTER — Encounter
Admission: RE | Admit: 2022-10-29 | Discharge: 2022-10-29 | Disposition: A | Discharge status: Home / Self Care | Payer: Medicare HMO | Source: Ambulatory Visit | Attending: Pulmonary Disease | Admitting: Pulmonary Disease

## 2022-10-29 VITALS — Ht 68.0 in | Wt 183.0 lb

## 2022-10-29 DIAGNOSIS — Z1152 Encounter for screening for COVID-19: Secondary | ICD-10-CM

## 2022-10-29 DIAGNOSIS — D496 Neoplasm of unspecified behavior of brain: Secondary | ICD-10-CM

## 2022-10-29 DIAGNOSIS — R918 Other nonspecific abnormal finding of lung field: Secondary | ICD-10-CM

## 2022-10-29 DIAGNOSIS — I159 Secondary hypertension, unspecified: Secondary | ICD-10-CM

## 2022-10-29 HISTORY — DX: Ataxia, unspecified: R27.0

## 2022-10-29 HISTORY — DX: Benign prostatic hyperplasia with lower urinary tract symptoms: N40.1

## 2022-10-29 NOTE — Patient Instructions (Addendum)
Your procedure is scheduled on: 11/03/2022  Report to the Registration Desk on the 1st floor of the [facility]. To find out your arrival time, please call [phone] between [place] on: 3/15/ 2024  If your arrival time is 6:00 am, do not arrive before that time as the [facility] entrance doors do not open until 6:00 am.  REMEMBER: Instructions that are not followed completely may result in serious medical risk, up to and including death; or upon the discretion of your surgeon and anesthesiologist your surgery may need to be rescheduled.  Do not eat food after midnight the night before surgery.  No gum chewing or hard candies.  You may however, drink CLEAR liquids up to 2 hours before you are scheduled to arrive for your surgery. Do not drink anything within 2 hours of your scheduled arrival time.  Clear liquids include: - water   One week prior to surgery: Stop Anti-inflammatories (NSAIDS) such as Advil, Aleve, Ibuprofen, Motrin, Naproxen, Naprosyn and Aspirin based products such as Excedrin, Goody's Powder, BC Powder. Stop ANY OVER THE COUNTER supplements until after surgery. You may however, continue to take Tylenol if needed for pain up until the day of surgery.  Continue taking all prescribed medications   Follow recommendations from Cardiologist or PCP regarding stopping blood thinners.  TAKE ONLY THESE MEDICATIONS THE MORNING OF SURGERY WITH A SIP OF WATER:  lamoTRIgine (lamictal)  Use inhalers on the day of surgery and bring to the hospital.   No Alcohol for 24 hours before or after surgery.  No Smoking including e-cigarettes for 24 hours before surgery.  No chewable tobacco products for at least 6 hours before surgery.  No nicotine patches on the day of surgery.  Do not use any "recreational" drugs for at least a week (preferably 2 weeks) before your surgery.  Please be advised that the combination of cocaine and anesthesia may have negative outcomes, up to and  including death. If you test positive for cocaine, your surgery will be cancelled.  On the morning of surgery brush your teeth with toothpaste and water, you may rinse your mouth with mouthwash if you wish. Do not swallow any toothpaste or mouthwash.  Use CHG Soap as directed on instruction sheet.  Do not wear jewelry, make-up, hairpins, clips or nail polish.  Do not wear lotions, powders, or perfumes.   Do not shave body hair from the neck down 48 hours before surgery.  Contact lenses, hearing aids and dentures may not be worn into surgery.  Do not bring valuables to the hospital. [facility] is not responsible for any missing/lost belongings or valuables.    Notify your doctor if there is any change in your medical condition (cold, fever, infection).  Wear comfortable clothing (specific to your surgery type) to the hospital.  After surgery, you can help prevent lung complications by doing breathing exercises.   If you are being admitted to the hospital overnight, leave your suitcase in the car. After surgery it may be brought to your room.  If you are being discharged the day of surgery, you will not be allowed to drive home. You will need a responsible individual to drive you home and stay with you for 24 hours after surgery.   If you are taking public transportation, you will need to have a responsible individual with you.  Please call the [facility] Dept. at [phone] if you have any questions about these instructions.

## 2022-10-29 NOTE — Progress Notes (Signed)
Called patient to do pre -admit interview but patient is not aware of the surgery date yet and also needs to schedule [employer] transportation five days before surgery and before covid test as well.

## 2022-10-29 NOTE — Patient Instructions (Addendum)
Your procedure is scheduled on:11/03/2022  Report to the Registration Desk on the 1st floor of the [facility].  To find out your arrival time, please call [phone] between 1PM - 3PM on: 10/31/2022  If your arrival time is 6:00 am, do not arrive before that time as the [facility] entrance doors do not open until 6:00 am.  REMEMBER: Instructions that are not followed completely may result in serious medical risk, up to and including death; or upon the discretion of your surgeon and anesthesiologist your surgery may need to be rescheduled.  Do not eat food after midnight the night before surgery.  No gum chewing or hard candies.    One week prior to surgery: Stop Anti-inflammatories (NSAIDS) such as Advil, Aleve, Ibuprofen, Motrin, Naproxen, Naprosyn and Aspirin based products such as Excedrin, Goody's Powder, BC Powder. Stop ANY OVER THE COUNTER supplements until after surgery. You may however, continue to take Tylenol if needed for pain up until the day of surgery.  Continue taking all prescribed medications with the exception of the following:   Follow recommendations from Cardiologist or PCP regarding stopping blood thinners.  TAKE ONLY THESE MEDICATIONS THE MORNING OF SURGERY WITH A SIP OF WATER:  lamoTRIgine (LAMICTAL    Use inhalers on the day of surgery and bring to the hospital.    No Alcohol for 24 hours before or after surgery.  No Smoking including e-cigarettes for 24 hours before surgery.  No chewable tobacco products for at least 6 hours before surgery.  No nicotine patches on the day of surgery.  Do not use any "recreational" drugs for at least a week (preferably 2 weeks) before your surgery.  Please be advised that the combination of cocaine and anesthesia may have negative outcomes, up to and including death. If you test positive for cocaine, your surgery will be cancelled.  On the morning of surgery brush your teeth with toothpaste and water, you may  rinse your mouth with mouthwash if you wish. Do not swallow any toothpaste or mouthwash.   You may shower on day of surgery  Do not wear jewelry, make-up, hairpins, clips or nail polish.  Do not wear lotions, powders, or perfumes.   Do not shave body hair from the neck down 48 hours before surgery.  Contact lenses, hearing aids and dentures may not be worn into surgery.  Do not bring valuables to the hospital. [facility] is not responsible for any missing/lost belongings or valuables.  . Notify your doctor if there is any change in your medical condition (cold, fever, infection).  Wear comfortable clothing (specific to your surgery type) to the hospital.  After surgery, you can help prevent lung complications by doing breathing exercises.  Take deep breaths and cough every 1-2 hours. Your doctor may order a device called an Incentive Spirometer to help you take deep breaths.  If you are being admitted to the hospital overnight, leave your suitcase in the car. After surgery it may be brought to your room.  In case of increased patient census, it may be necessary for you, the patient, to continue your postoperative care in the Same Day Surgery department.  If you are being discharged the day of surgery, you will not be allowed to drive home. You will need a responsible individual to drive you home and stay with you for 24 hours after surgery.   If you are taking public transportation, you will need to have a responsible individual with you.  Please  call the [facility] Dept. at [phone] if you have any questions about these instructions.  Surgery Visitation Policy:  Patients undergoing a surgery or procedure may have two family members or support persons with them as long as the person is not COVID-19 positive or experiencing its symptoms.   Inpatient Visitation:    Visiting hours are 7 a.m. to 8 p.m. Up to four visitors are allowed at one time in a patient  room. The visitors may rotate out with other people during the day. One designated support person (adult) may remain overnight.  Due to an increase in RSV and influenza rates and associated hospitalizations, children ages 28 and under will not be able to visit patients in [facility]. Masks continue to be strongly recommended.

## 2022-10-29 NOTE — Progress Notes (Signed)
Pre -admit Patient instructions regarding procedure discussed over the phone with wife due to patient does not have a good memory. Patient will have CT on his lungs that morning and then follow with his bronchoscopy  in same day floor. Questions answered.

## 2022-10-31 ENCOUNTER — Other Ambulatory Visit: Payer: Disability Insurance

## 2022-10-31 ENCOUNTER — Encounter: Payer: Self-pay | Admitting: Pulmonary Disease

## 2022-11-02 MED ORDER — FAMOTIDINE 20 MG PO TABS
20.0000 mg | ORAL_TABLET | Freq: Once | ORAL | Status: AC
  Administered 2022-11-03: 20 mg via ORAL

## 2022-11-02 MED ORDER — ORAL CARE MOUTH RINSE: 15.0000 mL | Freq: Once | OROMUCOSAL | Status: AC

## 2022-11-02 MED ORDER — LACTATED RINGERS IV SOLN: INTRAVENOUS | Status: DC

## 2022-11-02 MED ORDER — CHLORHEXIDINE GLUCONATE 0.12 % MT SOLN
15.0000 mL | Freq: Once | OROMUCOSAL | Status: AC
  Administered 2022-11-03: 15 mL via OROMUCOSAL

## 2022-11-03 ENCOUNTER — Other Ambulatory Visit: Payer: Self-pay

## 2022-11-03 ENCOUNTER — Ambulatory Visit: Payer: Medicare HMO

## 2022-11-03 ENCOUNTER — Ambulatory Visit: Payer: Medicare HMO | Admitting: Urgent Care

## 2022-11-03 ENCOUNTER — Encounter: Payer: Self-pay | Admitting: *Deleted

## 2022-11-03 ENCOUNTER — Ambulatory Visit
Admission: RE | Admit: 2022-11-03 | Discharge: 2022-11-03 | Disposition: A | Discharge status: Home / Self Care | Payer: Medicare HMO | Source: Ambulatory Visit | Attending: Pulmonary Disease | Admitting: Pulmonary Disease

## 2022-11-03 ENCOUNTER — Encounter
Admission: RE | Disposition: A | Discharge status: Home / Self Care | Payer: Self-pay | Source: Home / Self Care | Attending: Pulmonary Disease

## 2022-11-03 ENCOUNTER — Ambulatory Visit
Admission: RE | Admit: 2022-11-03 | Discharge: 2022-11-03 | Disposition: A | Discharge status: Home / Self Care | Payer: Medicare HMO | Attending: Pulmonary Disease | Admitting: Pulmonary Disease

## 2022-11-03 DIAGNOSIS — F32A Depression, unspecified: Secondary | ICD-10-CM | POA: Insufficient documentation

## 2022-11-03 DIAGNOSIS — J449 Chronic obstructive pulmonary disease, unspecified: Secondary | ICD-10-CM | POA: Diagnosis not present

## 2022-11-03 DIAGNOSIS — Z01810 Encounter for preprocedural cardiovascular examination: Secondary | ICD-10-CM | POA: Diagnosis not present

## 2022-11-03 DIAGNOSIS — I10 Essential (primary) hypertension: Secondary | ICD-10-CM | POA: Insufficient documentation

## 2022-11-03 DIAGNOSIS — G709 Myoneural disorder, unspecified: Secondary | ICD-10-CM | POA: Diagnosis not present

## 2022-11-03 DIAGNOSIS — I2510 Atherosclerotic heart disease of native coronary artery without angina pectoris: Secondary | ICD-10-CM | POA: Diagnosis not present

## 2022-11-03 DIAGNOSIS — E119 Type 2 diabetes mellitus without complications: Secondary | ICD-10-CM | POA: Diagnosis not present

## 2022-11-03 DIAGNOSIS — E039 Hypothyroidism, unspecified: Secondary | ICD-10-CM | POA: Insufficient documentation

## 2022-11-03 DIAGNOSIS — I4892 Unspecified atrial flutter: Secondary | ICD-10-CM | POA: Insufficient documentation

## 2022-11-03 DIAGNOSIS — R69 Illness, unspecified: Secondary | ICD-10-CM | POA: Diagnosis not present

## 2022-11-03 DIAGNOSIS — Z79899 Other long term (current) drug therapy: Secondary | ICD-10-CM | POA: Diagnosis not present

## 2022-11-03 DIAGNOSIS — F17210 Nicotine dependence, cigarettes, uncomplicated: Secondary | ICD-10-CM | POA: Diagnosis not present

## 2022-11-03 DIAGNOSIS — I4430 Unspecified atrioventricular block: Secondary | ICD-10-CM | POA: Insufficient documentation

## 2022-11-03 DIAGNOSIS — R918 Other nonspecific abnormal finding of lung field: Secondary | ICD-10-CM | POA: Insufficient documentation

## 2022-11-03 DIAGNOSIS — J90 Pleural effusion, not elsewhere classified: Secondary | ICD-10-CM | POA: Diagnosis not present

## 2022-11-03 DIAGNOSIS — J984 Other disorders of lung: Secondary | ICD-10-CM | POA: Diagnosis present

## 2022-11-03 DIAGNOSIS — C3432 Malignant neoplasm of lower lobe, left bronchus or lung: Secondary | ICD-10-CM | POA: Insufficient documentation

## 2022-11-03 HISTORY — DX: Other nonspecific abnormal finding of lung field: R91.8

## 2022-11-03 HISTORY — PX: VIDEO BRONCHOSCOPY WITH ENDOBRONCHIAL ULTRASOUND: SHX6177

## 2022-11-03 SURGERY — BRONCHOSCOPY, WITH EBUS
Anesthesia: General

## 2022-11-03 MED ORDER — DEXAMETHASONE SODIUM PHOSPHATE 10 MG/ML IJ SOLN
INTRAMUSCULAR | Status: DC | PRN
  Administered 2022-11-03: 10 mg via INTRAVENOUS

## 2022-11-03 MED ORDER — ONDANSETRON HCL 4 MG/2ML IJ SOLN
INTRAMUSCULAR | Status: AC
  Filled 2022-11-03: qty 2

## 2022-11-03 MED ORDER — ONDANSETRON HCL 4 MG/2ML IJ SOLN
INTRAMUSCULAR | Status: DC | PRN
  Administered 2022-11-03 (×2): 4 mg via INTRAVENOUS

## 2022-11-03 MED ORDER — ESMOLOL HCL 100 MG/10ML IV SOLN
INTRAVENOUS | Status: DC | PRN
  Administered 2022-11-03 (×2): 20 mg via INTRAVENOUS
  Administered 2022-11-03: 30 mg via INTRAVENOUS
  Administered 2022-11-03: 20 mg via INTRAVENOUS

## 2022-11-03 MED ORDER — METOPROLOL TARTRATE 5 MG/5ML IV SOLN
INTRAVENOUS | Status: DC | PRN
  Administered 2022-11-03 (×2): 2 mg via INTRAVENOUS
  Administered 2022-11-03: 1 mg via INTRAVENOUS

## 2022-11-03 MED ORDER — LIDOCAINE HCL URETHRAL/MUCOSAL 2 % EX GEL: 1.0000 | Freq: Once | CUTANEOUS | Status: DC

## 2022-11-03 MED ORDER — FAMOTIDINE 20 MG PO TABS
ORAL_TABLET | ORAL | Status: AC
  Filled 2022-11-03: qty 1

## 2022-11-03 MED ORDER — SPIRITUS FRUMENTI
1.0000 | Freq: Once | ORAL | Status: DC
  Filled 2022-11-03: qty 1

## 2022-11-03 MED ORDER — SUGAMMADEX SODIUM 200 MG/2ML IV SOLN
INTRAVENOUS | Status: DC | PRN
  Administered 2022-11-03: 200 mg via INTRAVENOUS

## 2022-11-03 MED ORDER — MIDAZOLAM HCL 2 MG/2ML IJ SOLN
INTRAMUSCULAR | Status: AC
  Filled 2022-11-03: qty 2

## 2022-11-03 MED ORDER — ROCURONIUM BROMIDE 100 MG/10ML IV SOLN
INTRAVENOUS | Status: DC | PRN
  Administered 2022-11-03: 50 mg via INTRAVENOUS

## 2022-11-03 MED ORDER — SPIRITUS FRUMENTI
1.0000 | Freq: Once | ORAL | Status: AC
  Administered 2022-11-03: 1 via ORAL
  Filled 2022-11-03: qty 1

## 2022-11-03 MED ORDER — PHENYLEPHRINE 80 MCG/ML (10ML) SYRINGE FOR IV PUSH (FOR BLOOD PRESSURE SUPPORT)
PREFILLED_SYRINGE | INTRAVENOUS | Status: AC
  Filled 2022-11-03: qty 10

## 2022-11-03 MED ORDER — DEXAMETHASONE SODIUM PHOSPHATE 10 MG/ML IJ SOLN
INTRAMUSCULAR | Status: AC
  Filled 2022-11-03: qty 1

## 2022-11-03 MED ORDER — BUTAMBEN-TETRACAINE-BENZOCAINE 2-2-14 % EX AERO: 1.0000 | INHALATION_SPRAY | Freq: Once | CUTANEOUS | Status: DC

## 2022-11-03 MED ORDER — PHENYLEPHRINE 80 MCG/ML (10ML) SYRINGE FOR IV PUSH (FOR BLOOD PRESSURE SUPPORT)
PREFILLED_SYRINGE | INTRAVENOUS | Status: DC | PRN
  Administered 2022-11-03: 160 ug via INTRAVENOUS

## 2022-11-03 MED ORDER — LIDOCAINE HCL (PF) 2 % IJ SOLN
INTRAMUSCULAR | Status: AC
  Filled 2022-11-03: qty 5

## 2022-11-03 MED ORDER — ROCURONIUM BROMIDE 10 MG/ML (PF) SYRINGE
PREFILLED_SYRINGE | INTRAVENOUS | Status: AC
  Filled 2022-11-03: qty 10

## 2022-11-03 MED ORDER — PHENYLEPHRINE HCL 0.25 % NA SOLN: 1.0000 | Freq: Four times a day (QID) | NASAL | Status: DC | PRN

## 2022-11-03 MED ORDER — LIDOCAINE HCL (CARDIAC) PF 100 MG/5ML IV SOSY
PREFILLED_SYRINGE | INTRAVENOUS | Status: DC | PRN
  Administered 2022-11-03: 80 mg via INTRAVENOUS
  Administered 2022-11-03: 20 mg via INTRAVENOUS

## 2022-11-03 MED ORDER — CHLORHEXIDINE GLUCONATE 0.12 % MT SOLN
OROMUCOSAL | Status: AC
  Filled 2022-11-03: qty 15

## 2022-11-03 MED ORDER — FENTANYL CITRATE (PF) 100 MCG/2ML IJ SOLN
INTRAMUSCULAR | Status: AC
  Filled 2022-11-03: qty 2

## 2022-11-03 MED ORDER — FENTANYL CITRATE (PF) 100 MCG/2ML IJ SOLN
INTRAMUSCULAR | Status: DC | PRN
  Administered 2022-11-03: 100 ug via INTRAVENOUS

## 2022-11-03 MED ORDER — PROPOFOL 10 MG/ML IV BOLUS
INTRAVENOUS | Status: DC | PRN
  Administered 2022-11-03: 120 mg via INTRAVENOUS

## 2022-11-03 MED ORDER — LIDOCAINE HCL (PF) 1 % IJ SOLN: 30.0000 mL | Freq: Once | INTRAMUSCULAR | Status: DC

## 2022-11-03 NOTE — Anesthesia Postprocedure Evaluation (Signed)
Anesthesia Post Note  Patient: Frederick Drake  Procedure(s) Performed: VIDEO BRONCHOSCOPY WITH ENDOBRONCHIAL ULTRASOUND ROBOTIC ASSISTED NAVIGATIONAL BRONCHOSCOPY  Patient location during evaluation: PACU Anesthesia Type: General Level of consciousness: awake and alert Pain management: pain level controlled Vital Signs Assessment: post-procedure vital signs reviewed and stable Respiratory status: spontaneous breathing, nonlabored ventilation, respiratory function stable and patient connected to nasal cannula oxygen Cardiovascular status: blood pressure returned to baseline and stable Postop Assessment: no apparent nausea or vomiting Anesthetic complications: no   No notable events documented.   Last Vitals:  Vitals:   11/03/22 1430 11/03/22 1445  BP: (!) 152/91 (!) 162/92  Pulse: 77 79  Resp: 17 12  Temp: (!) 36.3 C   SpO2: 100% 99%    Last Pain:  Vitals:   11/03/22 1430  TempSrc:   PainSc: 0-No pain                 Ilene Qua

## 2022-11-03 NOTE — Anesthesia Preprocedure Evaluation (Addendum)
Anesthesia Evaluation  Patient identified by MRN, date of birth, ID band Patient awake    Reviewed: Allergy & Precautions, NPO status , Patient's Chart, lab work & pertinent test results  History of Anesthesia Complications Negative for: history of anesthetic complications  Airway Mallampati: III  TM Distance: >3 FB Neck ROM: full    Dental  (+) Chipped   Pulmonary COPD, Current Smoker   Pulmonary exam normal        Cardiovascular Exercise Tolerance: Poor hypertension, On Medications + CAD  Normal cardiovascular exam  EKG  Atrial flutter with variable A-V block Septal infarct , age undetermined  Per Cardiology  Preop cardiovascular evaluation for bronchoscopy under general anesthesia: In spite of finding atrial flutter on his EKG, his ventricular rate is controlled and he does not seem to be having cardiac symptoms at the present time.  I agree that his functional capacity is difficult to assess due to significant neuropathy but he is able to perform activities of daily living and likely has functional capacity above 4 METS.  He can proceed with surgery at an overall low risk.   Neuro/Psych  Headaches, Seizures -,  PSYCHIATRIC DISORDERS  Depression     Neuromuscular disease    GI/Hepatic negative GI ROS,,,(+)     substance abuse  alcohol use  Endo/Other  diabetesHypothyroidism    Renal/GU negative Renal ROS  negative genitourinary   Musculoskeletal   Abdominal   Peds  Hematology  (+) Blood dyscrasia, anemia   Anesthesia Other Findings Past Medical History: No date: Acute encephalopathy No date: Acute hypoxic respiratory failure (HCC) No date: Alcohol abuse No date: Allergy     Comment:  Seasonal No date: Anemia No date: Ataxia No date: BPH associated with nocturia No date: Brain tumor (benign) (HCC) No date: Carpal tunnel syndrome     Comment:  bilateral No date: COPD (chronic obstructive pulmonary  disease) (HCC) No date: Depression No date: Diabetes mellitus without complication (HCC) No date: Dyspnea No date: Headache No date: Hypertension No date: Mass of lower lobe of left lung No date: Meningioma ([place]) No date: Moderate cognitive impairment No date: Neuropathy     Comment:  Both legs No date: Seizures ([place])     Comment:  last seizure was in January 2018: SVT (supraventricular tachycardia)     Comment:  during intubation in ED  Past Surgical History: 11/06/2016: CRANIOTOMY; Left     Comment:  Procedure: LEFT Temporal CRANIOTOMY FOR TUMOR;  Surgeon:              Ashok Pall, MD;  Location: [facility];  Service:               Neurosurgery;  Laterality: Left;  LEFT Temproal               CRANIOTOMY FOR TUMOR  01/20/2020: CRANIOTOMY; Right     Comment:  Procedure: Right frontal craniotomy for tumor resection;              Surgeon: Ashok Pall, MD;  [facility];  Service:               Neurosurgery;  Laterality: Right; No date: HERNIA REPAIR     Comment:  UMBICIAL No date: VASECTOMY  BMI    Body Mass Index: 27.82 kg/m      Reproductive/Obstetrics negative OB ROS  Anesthesia Physical Anesthesia Plan  ASA: 3  Anesthesia Plan: General   Post-op Pain Management:    Induction: Intravenous  PONV Risk Score and Plan: 2 and Dexamethasone, Ondansetron, Midazolam and Treatment may vary due to age or medical condition  Airway Management Planned: Oral ETT  Additional Equipment:   Intra-op Plan:   Post-operative Plan: Extubation in OR  Informed Consent: I have reviewed the patients History and Physical, chart, labs and discussed the procedure including the risks, benefits and alternatives for the proposed anesthesia with the patient or authorized representative who has indicated his/her understanding and acceptance.     Dental Advisory Given  Plan Discussed with: Anesthesiologist, CRNA and Surgeon  Anesthesia  Plan Comments: (Patient consented for risks of anesthesia including but not limited to:  - adverse reactions to medications - damage to eyes, teeth, lips or other oral mucosa - nerve damage due to positioning  - sore throat or hoarseness - Damage to heart, brain, nerves, lungs, other parts of body or loss of life  Patient voiced understanding.)        Anesthesia Quick Evaluation

## 2022-11-03 NOTE — Discharge Instructions (Signed)

## 2022-11-03 NOTE — Anesthesia Procedure Notes (Signed)
Procedure Name: Intubation Date/Time: 11/03/2022 3:18 PM  Performed by: Kelton Pillar, CRNAPre-anesthesia Checklist: Patient identified, Emergency Drugs available, Suction available and Patient being monitored Patient Re-evaluated:Patient Re-evaluated prior to induction Oxygen Delivery Method: Circle system utilized Preoxygenation: Pre-oxygenation with 100% oxygen Induction Type: IV induction Ventilation: Mask ventilation without difficulty Laryngoscope Size: McGraph and 3 Grade View: Grade I Tube type: Oral Tube size: 9.0 mm Number of attempts: 1 Airway Equipment and Method: Stylet and Oral airway Placement Confirmation: ETT inserted through vocal cords under direct vision, positive ETCO2, breath sounds checked- equal and bilateral and CO2 detector Secured at: 21 cm Tube secured with: Tape Dental Injury: Teeth and Oropharynx as per pre-operative assessment

## 2022-11-03 NOTE — Procedures (Signed)
ELECTROMAGNETIC NAVIGATIONAL BRONCHOSCOPY PROCEDURE NOTE  FIBEROPTIC BRONCHOSCOPY WITH BRONCHOALVEOLAR LAVAGE PROCEDURE NOTE  ENDOBRONCHIAL ULTRASOUND PROCEDURE NOTE    Flexible bronchoscopy was performed  by : Lanney Gins MD  assistance by : 1)Repiratory therapist  and 2)[employer] staff and 3) Anesthesia team and 4) Flouroscopy team and 5) [facility] supporting staff   Indication for the procedure was :  Pre-procedural H&P. The following assessment was performed on the day of the procedure prior to initiating sedation History:  Chest pain n Dyspnea y Hemoptysis n Cough y Fever n Other pertinent items n  Examination Vital signs -reviewed as per nursing documentation today Cardiac    Murmurs: n  Rubs : n  Gallop: n Lungs Wheezing: n Rales : n Rhonchi :y  Other pertinent findings: SOB/hypoxemia due to chronic lung disease   Pre-procedural assessment for Procedural Sedation included: Depth of sedation: As per anesthesia team  ASA Classification:  2 Mallampati airway assessment: 3    Medication list reviewed: y  The patient's interval history was taken and revealed: no new complaints The pre- procedure physical examination revealed: No new findings Refer to prior clinic note for details.  Informed Consent: Informed consent was obtained from:  patient after explanation of procedure and risks, benefits, as well as alternative procedures available.  Explanation of level of sedation and possible transfusion was also provided.    Procedural Preparation: Time out was performed and patient was identified by name and birthdate and procedure to be performed and side for sampling, if any, was specified. Pt was intubated by anesthesia.  The patient was appropriately draped.   Fiberoptic bronchoscopy with airway inspection and BAL Procedure findings:  Bronchoscope was inserted via ETT  without difficulty.  Posterior oropharynx, epiglottis, arytenoids, false cords and  vocal cords were not visualized as these were bypassed by endotracheal tube. The distal trachea was normal in circumference and appearance without mucosal, cartilaginous or branching abnormalities.  The main carina was mildly splayed . All right and left lobar airways were visualized to the Subsegmental level.  Sub- sub segmental carinae were identified in all the distal airways.   Secretions were visible in the following airways and appeared to be clear.  The mucosa was : friable at LLL  Airways were notable for:        exophytic lesions :n       extrinsic compression in the following distributions: n.       Friable mucosa: y       Neurosurgeon /pigmentation: n     Post procedure Diagnosis:   Severe mucus plugging worse on right lung. Multiple airways were aspirated including RUL, RML, RLL, LUL. The left lower lobe was extrinsically compressed and could not be accessed with regular flexible bronchoscope.      Electromagnetic Navigational Bronchoscopy Procedure Findings:  After appropriate CT-guided planning Monarch scope was advanced via endotracheal tube advanced for registration.  Post appropriate planning and registration peripheral navigation was used to visualize target lesion.    CYTOBRUSH X 1 OF LEFT LOWE LOBE  - LESIONAL CARCINOMA SURGICAL BIOPSY OF LEFT LOWER LOBE X 9 - LESIONAL CARCINOMA  Post procedure diagnosis: LUNG CANCER       Endobronchial ultrasound assisted hilar and mediastinal lymph node biopsies procedure findings: The fiberoptic bronchoscope was removed and the EBUS scope was introduced. Examination began to evaluate for pathologically enlarged lymph nodes starting on the RIGHT side progressing to the LEFT side.  All lymph node biopsies performed with 21G needle. Lymph node biopsies  were sent in cytolite for all stations.   Post procedure diagnosis:  lymphadenopathy   Specimens obtained included:        Cytology brushes : at LLL -  carcinoma  Broncho-alveolar lavage site:LLL  sent for cytology                              84ml volume infused 50 ml volume returned with serosang appearance  Endobronchial biopsy site:  LLL; sent for cytology                Immediate sampling complications included:none  Epinephrine ZERO ml was used topically  The bronchoscopy was terminated due to completion of the planned procedure and the bronchoscope was removed.   Total dosage of Lidocaine was ZERO mg Total fluoroscopy time was ZERO minutes  Supplemental oxygen was provided at AS PER ANESTHESIA lpm by nasal canula post operatively  Estimated Blood loss: < 10CC EXPECTED .  Complications included:  NONE IMMEDIATE   Preliminary CXR findings :  IN PROCESS  Disposition: HOME WITH WIFE   Follow up with Dr. Lanney Gins in 5 days for result discussion.     Ottie Glazier MD  [facility] Division of Pulmonary & Critical Care Medicine

## 2022-11-03 NOTE — Consult Note (Signed)
Cardiology Consultation:   Patient ID: Frederick Drake; IT:[id number]; [birth year]   Admit date: 11/03/2022 Date of Consult: 11/03/2022  Primary Care Provider: Leonel Ramsay, MD Primary Cardiologist: New - consult by Fletcher Anon Primary Electrophysiologist:  None   Patient Profile:   Frederick Drake is a 62 y.o. male with a hx of coronary artery calcification noted on CT imaging, recently diagnosed left lower lobe lung mass, seizure disorder with last seizure approximately 12 months prior, diabetes with diabetic neuropathy, meningioma status postsurgical intervention, COPD with ongoing tobacco use, HTN, hypothyroidism, BPH, alcohol use, osteoarthritis, carpal tunnel syndrome who is being seen today for preoperative cardiac risk stratification at the request of Dr. Danne Baxter.  History of Present Illness:   Mr. Frederick Drake has no previously known cardiac history.  He underwent lung cancer screening CT in 01/2021 with concerns for possible obstructing mass as well as bronchiectasis.  He was seen by pulmonology in 04/2021 and treated with doxycycline with follow-up chest x-ray showing some resolution.  Following this, he was lost to follow-up.  He followed up with his PCP in 05/2022 with repeat chest CT showing progressive infiltrative neoplasm in the left lower lobe of the lung.  Subsequent PET scan showed enlargement with intense hypermetabolic activity.  The mass extended into the left infrahilar lung and obstructing the bronchus.  He has been evaluated by pulmonology with plans to undergo video-assisted bronchoscopy today.  Upon arriving to preop, he was incidentally noted to be in rate controlled atrial flutter with variable AV block.  He was asymptomatic.  It was also noted the patient's PCP had previously considered stress testing due to coronary artery calcification noted on CT imaging.  In this setting, we are asked to evaluate the patient for preoperative cardiac risk stratification.  Patient denies any  symptoms of angina or cardiac decompensation including dyspnea.  No palpitations, dizziness, presyncope, or syncope.  He has ambulated with a walker for at least the past 12 months secondary to frequent falls in the context of severe peripheral neuropathy.  However, he indicates he will typically ambulate 50 to 75 yards each way on a regular basis without cardiac limitation.  He continues to smoke 1-1/2 to 2 packs of cigarettes per day and has done so for many years.  Patient is currently without complaint.  Wife at bedside.   [facility] Activity Status Index: Difficult to fully assess his functional status due to limitations surrounding his comorbid conditions.  However, he is able to ambulate a total of 100 to 150 yards on a regular basis with his walker without cardiac limitation.  Revised Cardiac Risk Index: Low risk for an overall low risk noncardiac procedure with an estimated rate 0.9% for adverse cardiac event in the perioperative timeframe.    Past Medical History:  Diagnosis Date   Acute encephalopathy    Acute hypoxic respiratory failure (HCC)    Alcohol abuse    Allergy    Seasonal   Anemia    Ataxia    BPH associated with nocturia    Brain tumor (benign) (HCC)    Carpal tunnel syndrome    bilateral   COPD (chronic obstructive pulmonary disease) (HCC)    Depression    Diabetes mellitus without complication (HCC)    Dyspnea    Headache    Hypertension    Mass of lower lobe of left lung    Meningioma (HCC)    Moderate cognitive impairment    Neuropathy    Both legs  Seizures ([place])    last seizure was in January   SVT (supraventricular tachycardia) 2018   during intubation in ED    Past Surgical History:  Procedure Laterality Date   CRANIOTOMY Left 11/06/2016   Procedure: LEFT Temporal CRANIOTOMY FOR TUMOR;  Surgeon: Ashok Pall, MD;  Location: [facility];  Service: Neurosurgery;  Laterality: Left;  LEFT Temproal CRANIOTOMY FOR TUMOR    CRANIOTOMY Right 01/20/2020    Procedure: Right frontal craniotomy for tumor resection;  Surgeon: Ashok Pall, MD;  Location: [facility];  Service: Neurosurgery;  Laterality: Right;   HERNIA REPAIR     UMBICIAL   VASECTOMY       Home Meds: Prior to Admission medications   Medication Sig Start Date End Date Taking? Authorizing Provider  acetaminophen (TYLENOL) 500 MG tablet Take 1,000 mg by mouth every 6 (six) hours as needed.   Yes [provider]  cholecalciferol (VITAMIN D3) 25 MCG (1000 UNIT) tablet Take 1,000 Units by mouth daily.   Yes [provider]  Fluticasone-Umeclidin-Vilant (TRELEGY ELLIPTA) 100-62.5-25 MCG/ACT AEPB Inhale 1 puff into the lungs every morning.   Yes [provider]  gabapentin (NEURONTIN) 300 MG capsule Take 1 capsule (300 mg) by mouth once a day for 1 week, then 1 capsule (300 mg) by mouth two times a day for 1 week, then 1 capsule (300 mg) by mouth three times a day. Patient taking differently: Take 300-600 mg by mouth 3 (three) times daily. 2 tab in am, 1 tab at lunch and 1 tab in the evening 12/13/21  Yes   lamoTRIgine (LAMICTAL) 100 MG tablet Take 1 tablet (100 mg total) by mouth 2 (two) times daily. 02/07/21  Yes   nicotine (NICOTROL) 10 MG inhaler Inhale 1 continuous puffing into the lungs as needed for smoking cessation.   Yes [provider]  sulfamethoxazole-trimethoprim (BACTRIM) 400-80 MG tablet Take 1 tablet by mouth 2 (two) times daily.   Yes [provider]    Inpatient Medications: Scheduled Meds:  butamben-tetracaine-benzocaine  1 spray Topical Once   chlorhexidine       famotidine       lidocaine (PF)  30 mL Other Once   lidocaine  1 Application Topical Once   Continuous Infusions:  lactated ringers 10 mL/hr at 11/03/22 1016   PRN Meds: chlorhexidine, famotidine, phenylephrine  Allergies:   Allergies  Allergen Reactions   Keppra [Levetiracetam] Anxiety and Other (See Comments)    Severe mood changes   Chantix [Varenicline]  Hives   Wasp Venom Swelling   Penicillins Rash    Has patient had a PCN reaction causing immediate rash, facial/tongue/throat swelling, SOB or lightheadedness with hypotension:YES Has patient had a PCN reaction causing severe rash involving mucus membranes or skin necrosis: NO Has patient had a PCN reaction that required hospitalization NO Has patient had a PCN reaction occurring within the last 10 years: NO If all of the above answers are "NO", then may proceed with Cephalosporin use.    Social History:   Social History   Socioeconomic History   Marital status: Married    Spouse name: Not on file   Number of children: Not on file   Years of education: Not on file   Highest education level: Not on file  Occupational History   Not on file  Tobacco Use   Smoking status: Every Day    Packs/day: 2.00    Years: 43.00    Additional pack years: 0.00    Total  pack years: 86.00    Types: Cigarettes   Smokeless tobacco: Never  Vaping Use   Vaping Use: Never used  Substance and Sexual Activity   Alcohol use: Yes    Comment: 7 beers daily   Drug use: Yes    Types: Marijuana    Comment: everyday   Sexual activity: Not on file  Other Topics Concern   Not on file  Social History Narrative   Lives at home with wife. Patient does not have a good memory and has issues with transportation.    Social Determinants of Health   Financial Resource Strain: Not on file  Food Insecurity: Not on file  Transportation Needs: Not on file  Physical Activity: Not on file  Stress: Not on file  Social Connections: Not on file  Intimate Partner Violence: Not on file     Family History:   Family History  Problem Relation Age of Onset   Cancer Sister        Breast    ROS:  Review of Systems  Constitutional:  Positive for malaise/fatigue. Negative for chills, diaphoresis, fever and weight loss.  HENT:  Negative for congestion.   Eyes:  Negative for discharge and redness.  Respiratory:   Negative for cough, sputum production, shortness of breath and wheezing.   Cardiovascular:  Negative for chest pain, palpitations, orthopnea, claudication, leg swelling and PND.  Gastrointestinal:  Negative for abdominal pain, heartburn, nausea and vomiting.  Musculoskeletal:  Positive for falls. Negative for myalgias.  Skin:  Negative for rash.  Neurological:  Positive for weakness. Negative for dizziness, tingling, tremors, sensory change, speech change, focal weakness and loss of consciousness.  Endo/Heme/Allergies:  Does not bruise/bleed easily.  Psychiatric/Behavioral:  Negative for substance abuse. The patient is not nervous/anxious.   All other systems reviewed and are negative.     Physical Exam/Data:   Vitals:   11/03/22 1008  BP: (!) 161/88  Pulse: 79  Resp: 16  Temp: (!) 97.1 F (36.2 C)  TempSrc: Temporal  SpO2: 99%  Weight: 83 kg  Height: 5\' 8"  (1.727 m)   No intake or output data in the 24 hours ending 11/03/22 1209 Filed Weights   11/03/22 1008  Weight: 83 kg   Body mass index is 27.82 kg/m.   Physical Exam: General: Well developed, well nourished, in no acute distress. Head: Normocephalic, atraumatic, sclera non-icteric, no xanthomas, nares without discharge.  Neck: Negative for carotid bruits. JVD difficult to assess secondary to facial hair/beard. Lungs: Diminished breath sounds along the bases bilaterally with the left being greater than the right. Breathing is unlabored. Heart: Irregular with S1 S2. No murmurs, rubs, or gallops appreciated. Abdomen: Soft, non-tender, non-distended with normoactive bowel sounds. No hepatomegaly. No rebound/guarding. No obvious abdominal masses. Msk:  Strength and tone appear normal for age. Extremities: No clubbing or cyanosis. No edema. Distal pedal pulses are 2+ and equal bilaterally. Neuro: Alert and oriented X 3. No facial asymmetry. No focal deficit. Moves all extremities spontaneously. Psych:  Responds to questions  appropriately with a normal affect.   EKG:  The EKG was personally reviewed and demonstrates: Atrial flutter with variable AV block, 60 bpm, possible prior septal infarct, nonspecific ST-T changes Telemetry:  Telemetry was personally reviewed and demonstrates: Not on tele  Weights: Filed Weights   11/03/22 1008  Weight: 83 kg    Relevant CV Studies:   Laboratory Data:  ChemistryNo results for input(s): "NA", "K", "CL", "CO2", "GLUCOSE", "BUN", "CREATININE", "CALCIUM", "GFRNONAA", "GFRAA", "ANIONGAP"  in the last 168 hours.  No results for input(s): "PROT", "ALBUMIN", "AST", "ALT", "ALKPHOS", "BILITOT" in the last 168 hours. HematologyNo results for input(s): "WBC", "RBC", "HGB", "HCT", "MCV", "MCH", "MCHC", "RDW", "PLT" in the last 168 hours. Cardiac EnzymesNo results for input(s): "TROPONINI" in the last 168 hours. No results for input(s): "TROPIPOC" in the last 168 hours.  BNPNo results for input(s): "BNP", "PROBNP" in the last 168 hours.  DDimer No results for input(s): "DDIMER" in the last 168 hours.  Radiology/Studies:  No results found.  Assessment and Plan:   1. Preprocedure cardiac risk stratification: -Patient scheduled to undergo endoscopy for evaluation of mass in possible tissue diagnosis -Upon arrival to preop, he was noted to be in newly documented atrial flutter with variable AV block and controlled ventricular response.  He was completely asymptomatic and unaware of this rhythm -Upon reviewing EKGs in Epic, there is no previous diagnosis of atrial flutter, though he was noted to have SVT in 10/2016 -He is also noted to have coronary artery calcification on CT imaging -He is without symptoms of angina or cardiac decompensation -It is difficult to fully assess his functional status through [facility] activity status index given his limitations and secondary to significant comorbid conditions, however he is able to ambulate 50 to 75 yards with his walker each way without  cardiac limitation -Per RCRI, he is low risk for low risk cardiac procedure with an estimated rate of 0.9% for adverse cardiac event in the periprocedural timeframe -He may proceed with noncardiac procedure at an overall low risk without further cardiac evaluation  2. Atrial flutter: -Uncertain chronicity -Asymptomatic -Given ventricular rates in the 60s bpm, defer addition of AV nodal blocking medication at this time -CHADS2VASc at least 3 (HTN, DM vascular disease) -Will need to have formal discussion with patient regarding Fab[facility] in the office, pending findings noted with this bronchoscopy and planned treatment course -Patient will need an echo in our office  3. Coronary artery calcification: -No symptoms of angina -Follow-up in the outpatient setting  4.  PSVT: -Noted on EKG 10/16/2016 -No further documented recurrence  4. Lung mass with ongoing tobacco use: -Planning for bronchoscopy -Follow-up with pulmonology as directed -Complete cessation of tobacco recommended       For questions or updates, please contact [facility] Please consult [email or web address] for contact info under Cardiology/STEMI.   Signed, Christell Faith, PA-C [facility] Pager: [phone] 11/03/2022, 12:09 PM

## 2022-11-03 NOTE — H&P (Signed)
PULMONOLOGY         Date: 11/03/2022,   MRN# [id number] KRESTON PEARL [birth year]     AdmissionWeight: 83 kg                 CurrentWeight: 83 kg  Referring provider: Dr Raul Del    CHIEF COMPLAINT:   Left lower lobe lung mass with hilar lymphadenopathy   HISTORY OF PRESENT ILLNESS   This is a 62 yo M with hx of tobacco smoking and COPD.  He was noted to have LLL lung mass and I have seen him for this in pulmonology clinic.  Patient had PET scan done with findings of nterval enlargement of the LEFT lower lobe mass measuring 8.5 x 8.1 cm increased from 7.8 x 4.5 cm on most recent CT exam. Mass is intensely hypermetabolic with SUV max equal 5.9. Mass extends to the LEFT infrahilar lung and obstructs the bronchus Paitient reports no new symptoms.  He had noted Atrial flutter on EKG, shares he has no chest discomfort presyncope or lightheadedness. I have discussed this with ansesthesiology Dr Danne Baxter who also wished to have additional evaluation due to this new finding.  I have contacted on call cardiologist who evaluated EKGs and we also discussed case in detail.  Patient appears to be in chronic stable state and reports no discomfort or new symptoms and wishes to proceed with bronchoscopy.  Reviewed risks/complications and benefits with patient, risks include infection, pneumothorax/pneumomediastinum which may require chest tube placement as well as overnight/prolonged hospitalization and possible mechanical ventilation. Other risks include bleeding and very rarely death.  Patient understands risks and wishes to proceed.  Additional questions were answered, and patient is aware that post procedure patient will be going home with family and may experience cough with possible clots on expectoration as well as phlegm which may last few days as well as hoarseness of voice post intubation and mechanical ventilation.   PAST MEDICAL HISTORY   Past Medical History:  Diagnosis Date    Acute encephalopathy    Acute hypoxic respiratory failure (HCC)    Alcohol abuse    Allergy    Seasonal   Anemia    Ataxia    BPH associated with nocturia    Brain tumor (benign) (HCC)    Carpal tunnel syndrome    bilateral   COPD (chronic obstructive pulmonary disease) (HCC)    Depression    Diabetes mellitus without complication (HCC)    Dyspnea    Headache    Hypertension    Mass of lower lobe of left lung    Meningioma (HCC)    Moderate cognitive impairment    Neuropathy    Both legs   Seizures ([place])    last seizure was in January   SVT (supraventricular tachycardia) 2018   during intubation in ED     SURGICAL HISTORY   Past Surgical History:  Procedure Laterality Date   CRANIOTOMY Left 11/06/2016   Procedure: LEFT Temporal CRANIOTOMY FOR TUMOR;  Surgeon: Ashok Pall, MD;  Location: [facility];  Service: Neurosurgery;  Laterality: Left;  LEFT Temproal CRANIOTOMY FOR TUMOR    CRANIOTOMY Right 01/20/2020   Procedure: Right frontal craniotomy for tumor resection;  Surgeon: Ashok Pall, MD;  Location: [facility];  Service: Neurosurgery;  Laterality: Right;   HERNIA REPAIR     UMBICIAL   VASECTOMY       FAMILY HISTORY   Family History  Problem Relation Age of Onset   Cancer  Sister        Breast     SOCIAL HISTORY   Social History   Tobacco Use   Smoking status: Every Day    Packs/day: 2.00    Years: 43.00    Additional pack years: 0.00    Total pack years: 86.00    Types: Cigarettes   Smokeless tobacco: Never  Vaping Use   Vaping Use: Never used  Substance Use Topics   Alcohol use: Yes    Comment: 7 beers daily   Drug use: Yes    Types: Marijuana    Comment: everyday     MEDICATIONS    Home Medication:    Current Medication:  Current Facility-Administered Medications:    chlorhexidine (PERIDEX) 0.12 % solution, , , ,    famotidine (PEPCID) 20 MG tablet, , , ,    lactated ringers infusion, , Intravenous, Continuous, Arita Miss, MD, Last Rate:  10 mL/hr at 11/03/22 1016, New Bag at 11/03/22 1016    ALLERGIES   Keppra [levetiracetam], Chantix [varenicline], Wasp venom, and Penicillins     REVIEW OF SYSTEMS    Review of Systems:  Gen:  Denies  fever, sweats, chills weigh loss  HEENT: Denies blurred vision, double vision, ear pain, eye pain, hearing loss, nose bleeds, sore throat Cardiac:  No dizziness, chest pain or heaviness, chest tightness,edema Resp:   reports dyspnea chronically  Gi: Denies swallowing difficulty, stomach pain, nausea or vomiting, diarrhea, constipation, bowel incontinence Gu:  Denies bladder incontinence, burning urine Ext:   Denies Joint pain, stiffness or swelling Skin: Denies  skin rash, easy bruising or bleeding or hives Endoc:  Denies polyuria, polydipsia , polyphagia or weight change Psych:   Denies depression, insomnia or hallucinations   Other:  All other systems negative   VS: BP (!) 161/88   Pulse 79   Temp (!) 97.1 F (36.2 C) (Temporal)   Resp 16   Ht 5\' 8"  (1.727 m)   Wt 83 kg   SpO2 99%   BMI 27.82 kg/m      PHYSICAL EXAM    GENERAL:NAD, no fevers, chills, no weakness no fatigue HEAD: Normocephalic, atraumatic.  EYES: Pupils equal, round, reactive to light. Extraocular muscles intact. No scleral icterus.  MOUTH: Moist mucosal membrane. Dentition intact. No abscess noted.  EAR, NOSE, THROAT: Clear without exudates. No external lesions.  NECK: Supple. No thyromegaly. No nodules. No JVD.  PULMONARY: decreased breath sounds with mild rhonchi worse at bases bilaterally.  CARDIOVASCULAR: S1 and S2. Regular rate and rhythm. No murmurs, rubs, or gallops. No edema. Pedal pulses 2+ bilaterally.  GASTROINTESTINAL: Soft, nontender, nondistended. No masses. Positive bowel sounds. No hepatosplenomegaly.  MUSCULOSKELETAL: No swelling, clubbing, or edema. Range of motion full in all extremities.  NEUROLOGIC: Cranial nerves II through XII are intact. No gross focal neurological  deficits. Sensation intact. Reflexes intact.  SKIN: No ulceration, lesions, rashes, or cyanosis. Skin warm and dry. Turgor intact.  PSYCHIATRIC: Mood, affect within normal limits. The patient is awake, alert and oriented x 3. Insight, judgment intact.       IMAGING   @IMAGES @   ASSESSMENT/PLAN   Left lowe lobe lung mass with hilar adenopathy   Patient is here today for lung biopsy he is in chronic stable state.  - his EKG showed new Atrial flutter, patient states he feels nothing unusual and has no idea how long this has been going on.  He is not tachycardic and is asymptomatic. Cardiology was able to review  case and states patient can proceed with bronchoscopy.  -Reviewed risks/complications and benefits with patient, risks include infection, pneumothorax/pneumomediastinum which may require chest tube placement as well as overnight/prolonged hospitalization and possible mechanical ventilation. Other risks include bleeding and very rarely death.  Patient understands risks and wishes to proceed.  Additional questions were answered, and patient is aware that post procedure patient will be going home with family and may experience cough with possible clots on expectoration as well as phlegm which may last few days as well as hoarseness of voice post intubation and mechanical ventilation.       Thank you for allowing me to participate in the care of this patient.   Patient/Family are satisfied with care plan and all questions have been answered.    Provider disclosure: Patient with at least one acute or chronic illness or injury that poses a threat to life or bodily function and is being managed actively during this encounter.  All of the below services have been performed independently by signing provider:  review of prior documentation from internal and or external health records.  Review of previous and current lab results.  Interview and comprehensive assessment during patient visit  today. Review of current and previous chest radiographs/CT scans. Discussion of management and test interpretation with health care team and patient/family.   This document was prepared using Dragon voice recognition software and may include unintentional dictation errors.     Ottie Glazier, M.D.  Division of Pulmonary & Critical Care Medicine

## 2022-11-03 NOTE — Progress Notes (Signed)
I was called by Dr. Lanney Gins.  This patient came for routine bronchoscopy and was noted to be in atrial flutter with controlled ventricular rate.  He is not tachycardic and his vital signs seem to be stable.  This by itself, does not require canceling the procedure given that his ventricular rate is controlled. Can proceed with bronchoscopy with no need for further cardiac evaluation.  We can arrange for outpatient cardiac evaluation.

## 2022-11-03 NOTE — Transfer of Care (Signed)
Immediate Anesthesia Transfer of Care Note  Patient: Frederick Drake  Procedure(s) Performed: VIDEO BRONCHOSCOPY WITH ENDOBRONCHIAL ULTRASOUND ROBOTIC ASSISTED NAVIGATIONAL BRONCHOSCOPY  Patient Location: PACU  Anesthesia Type:General  Level of Consciousness: awake, drowsy, and patient cooperative  Airway & Oxygen Therapy: Patient Spontanous Breathing and Patient connected to face mask oxygen  Post-op Assessment: Report given to RN and Post -op Vital signs reviewed and stable  Post vital signs: Reviewed and stable  Last Vitals:  Vitals Value Taken Time  BP 159/93 11/03/22 1426  Temp    Pulse 77 11/03/22 1430  Resp 17 11/03/22 1430  SpO2 100 % 11/03/22 1430  Vitals shown include unvalidated device data.  Last Pain:  Vitals:   11/03/22 1008  TempSrc: Temporal  PainSc: 0-No pain         Complications: No notable events documented.

## 2022-11-04 ENCOUNTER — Encounter: Payer: Self-pay | Admitting: Pulmonary Disease

## 2022-11-04 ENCOUNTER — Other Ambulatory Visit: Payer: Self-pay | Admitting: Specialist

## 2022-11-04 DIAGNOSIS — H9313 Tinnitus, bilateral: Secondary | ICD-10-CM | POA: Diagnosis not present

## 2022-11-04 DIAGNOSIS — G621 Alcoholic polyneuropathy: Secondary | ICD-10-CM | POA: Diagnosis not present

## 2022-11-04 DIAGNOSIS — Z8669 Personal history of other diseases of the nervous system and sense organs: Secondary | ICD-10-CM | POA: Diagnosis not present

## 2022-11-04 DIAGNOSIS — H547 Unspecified visual loss: Secondary | ICD-10-CM | POA: Diagnosis not present

## 2022-11-04 DIAGNOSIS — F1020 Alcohol dependence, uncomplicated: Secondary | ICD-10-CM | POA: Diagnosis not present

## 2022-11-04 DIAGNOSIS — R569 Unspecified convulsions: Secondary | ICD-10-CM | POA: Diagnosis not present

## 2022-11-04 DIAGNOSIS — Z86018 Personal history of other benign neoplasm: Secondary | ICD-10-CM | POA: Diagnosis not present

## 2022-11-04 LAB — CYTOLOGY - NON PAP

## 2022-11-04 LAB — SURGICAL PATHOLOGY

## 2022-11-05 ENCOUNTER — Telehealth: Payer: Self-pay | Admitting: Oncology

## 2022-11-05 NOTE — Telephone Encounter (Signed)
Spoke with patient's spouse to review appointments scheduled for med onc and radiation next week. She confirmed appointment day/times and has my ext to call me back if there are any conflicts. She did mention that they have to request transportation and I again let her know that they can contact me if I can assist with this.

## 2022-11-06 ENCOUNTER — Other Ambulatory Visit: Payer: Medicare HMO

## 2022-11-11 ENCOUNTER — Ambulatory Visit
Admission: RE | Admit: 2022-11-11 | Discharge: 2022-11-11 | Disposition: A | Discharge status: Home / Self Care | Payer: Medicare HMO | Source: Ambulatory Visit | Attending: Radiation Oncology | Admitting: Radiation Oncology

## 2022-11-11 ENCOUNTER — Encounter: Payer: Self-pay | Admitting: Oncology

## 2022-11-11 ENCOUNTER — Inpatient Hospital Stay: Payer: Medicare HMO

## 2022-11-11 ENCOUNTER — Inpatient Hospital Stay: Payer: Medicare HMO | Attending: Oncology | Admitting: Oncology

## 2022-11-11 ENCOUNTER — Encounter: Payer: Self-pay | Admitting: Radiation Oncology

## 2022-11-11 VITALS — BP 135/79 | HR 92 | Temp 98.0°F | Resp 18 | Ht 72.0 in | Wt 179.0 lb

## 2022-11-11 VITALS — BP 135/79 | HR 92 | Temp 98.0°F | Resp 18 | Ht 72.0 in | Wt 179.8 lb

## 2022-11-11 DIAGNOSIS — C3432 Malignant neoplasm of lower lobe, left bronchus or lung: Secondary | ICD-10-CM | POA: Insufficient documentation

## 2022-11-11 DIAGNOSIS — J9601 Acute respiratory failure with hypoxia: Secondary | ICD-10-CM | POA: Insufficient documentation

## 2022-11-11 DIAGNOSIS — F17210 Nicotine dependence, cigarettes, uncomplicated: Secondary | ICD-10-CM | POA: Insufficient documentation

## 2022-11-11 DIAGNOSIS — J449 Chronic obstructive pulmonary disease, unspecified: Secondary | ICD-10-CM | POA: Insufficient documentation

## 2022-11-11 DIAGNOSIS — Z79899 Other long term (current) drug therapy: Secondary | ICD-10-CM | POA: Insufficient documentation

## 2022-11-11 DIAGNOSIS — G40909 Epilepsy, unspecified, not intractable, without status epilepticus: Secondary | ICD-10-CM | POA: Insufficient documentation

## 2022-11-11 DIAGNOSIS — E119 Type 2 diabetes mellitus without complications: Secondary | ICD-10-CM | POA: Insufficient documentation

## 2022-11-11 DIAGNOSIS — F1010 Alcohol abuse, uncomplicated: Secondary | ICD-10-CM | POA: Insufficient documentation

## 2022-11-11 DIAGNOSIS — I471 Supraventricular tachycardia, unspecified: Secondary | ICD-10-CM | POA: Insufficient documentation

## 2022-11-11 DIAGNOSIS — R69 Illness, unspecified: Secondary | ICD-10-CM | POA: Diagnosis not present

## 2022-11-11 LAB — CMP (CANCER CENTER ONLY)
ALT: 12 U/L (ref 0–44)
AST: 21 U/L (ref 15–41)
Albumin: 3.6 g/dL (ref 3.5–5.0)
Alkaline Phosphatase: 79 U/L (ref 38–126)
Anion gap: 9 (ref 5–15)
BUN: 7 mg/dL — ABNORMAL LOW (ref 8–23)
CO2: 28 mmol/L (ref 22–32)
Calcium: 9 mg/dL (ref 8.9–10.3)
Chloride: 97 mmol/L — ABNORMAL LOW (ref 98–111)
Creatinine: 0.64 mg/dL (ref 0.61–1.24)
GFR, Estimated: >60 mL/min (ref >60)
Glucose, Bld: 119 mg/dL — ABNORMAL HIGH (ref 70–99)
Potassium: 3.8 mmol/L (ref 3.5–5.1)
Sodium: 134 mmol/L — ABNORMAL LOW (ref 135–145)
Total Bilirubin: 0.7 mg/dL (ref 0.3–1.2)
Total Protein: 8.6 g/dL — ABNORMAL HIGH (ref 6.5–8.1)

## 2022-11-11 LAB — CBC WITH DIFFERENTIAL/PLATELET
Abs Immature Granulocytes: 0.04 10*3/uL (ref 0.00–0.07)
Basophils Absolute: 0.1 10*3/uL (ref 0.0–0.1)
Basophils Relative: 1 %
Eosinophils Absolute: 0 10*3/uL (ref 0.0–0.5)
Eosinophils Relative: 0 %
HCT: 42.1 % (ref 39.0–52.0)
Hemoglobin: 13.8 g/dL (ref 13.0–17.0)
Immature Granulocytes: 0 %
Lymphocytes Relative: 9 %
Lymphs Abs: 1 10*3/uL (ref 0.7–4.0)
MCH: 30.7 pg (ref 26.0–34.0)
MCHC: 32.8 g/dL (ref 30.0–36.0)
MCV: 93.6 fL (ref 80.0–100.0)
Monocytes Absolute: 1 10*3/uL (ref 0.1–1.0)
Monocytes Relative: 9 %
Neutro Abs: 8.8 10*3/uL — ABNORMAL HIGH (ref 1.7–7.7)
Neutrophils Relative %: 81 %
Platelets: 200 10*3/uL (ref 150–400)
RBC: 4.5 MIL/uL (ref 4.22–5.81)
RDW: 14.8 % (ref 11.5–15.5)
WBC: 10.9 10*3/uL — ABNORMAL HIGH (ref 4.0–10.5)
nRBC: 0 % (ref 0.0–0.2)

## 2022-11-11 MED ORDER — ONDANSETRON HCL 8 MG PO TABS: 8.0000 mg | ORAL_TABLET | Freq: Three times a day (TID) | ORAL | 1 refills | Status: DC | PRN

## 2022-11-11 MED ORDER — LIDOCAINE-PRILOCAINE 2.5-2.5 % EX CREA: TOPICAL_CREAM | CUTANEOUS | 3 refills | Status: DC

## 2022-11-11 MED ORDER — PROCHLORPERAZINE MALEATE 10 MG PO TABS: 10.0000 mg | ORAL_TABLET | Freq: Four times a day (QID) | ORAL | 1 refills | Status: DC | PRN

## 2022-11-11 NOTE — Consult Note (Signed)
NEW PATIENT EVALUATION  Name: Frederick Drake  MRN: [id number]  Date:   11/11/2022     DOB: 19-Aug-1960   This 62 y.o. male patient presents to the clinic for initial evaluation of stage IIIa (cT4 N1 M0).  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   PREVIOUS INVESTIGATIONS:  CT scans PET CT scans MRI brain all reviewed Pathology reports reviewed Clinical notes reviewed  HPI: Patient is a 62 year old male a smoker with COPD.  He presented with some atypical pain in his left chest found to have a left lower lobe mass measuring 8.5 x 8 x 1 cm.  PET CT scan was performed showing hypermetabolic activity in enlarging left lower lobe mass consistent with bronchogenic carcinoma.  Mass extends to the left infrahilar lung and obstructs the left lower lobe bronchus.  Also hypermetabolic left infrahilar lymph nodes consistent with metastatic adenopathy.  No other evidence of extrathoracic disease or metastatic disease was noted.  Biopsy was positive for invasive keratinizing squamous cell carcinoma.  Ancillary testing is being pursued.  He is now referred to both medical oncology and radiation oncology for opinion he is doing fairly well he specifically denies cough hemoptysis or chest tightness.  MRI of brain did show unchanged residual meningioma along the floor of the anterior cranial fossa  PLANNED TREATMENT REGIMEN: Concurrent chemoradiation  PAST MEDICAL HISTORY:  has a past medical history of Acute encephalopathy, Acute hypoxic respiratory failure ([place]), Alcohol abuse, Allergy, Anemia, Ataxia, BPH associated with nocturia, Brain tumor (benign) ([place]), Carpal tunnel syndrome, COPD (chronic obstructive pulmonary disease) ([place]), Depression, Diabetes mellitus  without complication ([place]), Dyspnea, Headache, Hypertension, Mass of lower lobe of left lung, Meningioma ([place]), Moderate cognitive impairment, Neuropathy, Seizures ([place]), and SVT (supraventricular tachycardia) (2018).    PAST SURGICAL HISTORY:  Past Surgical History:  Procedure Laterality Date   CRANIOTOMY Left 11/06/2016   Procedure: LEFT Temporal CRANIOTOMY FOR TUMOR;  Surgeon: Ashok Pall, MD;  Location: [facility];  Service: Neurosurgery;  Laterality: Left;  LEFT Temproal CRANIOTOMY FOR TUMOR    CRANIOTOMY Right 01/20/2020   Procedure: Right frontal craniotomy for tumor resection;  Surgeon: Ashok Pall, MD;  Location: [facility];  Service: Neurosurgery;  Laterality: Right;   HERNIA REPAIR     UMBICIAL   VASECTOMY     VIDEO BRONCHOSCOPY WITH ENDOBRONCHIAL ULTRASOUND N/A 11/03/2022   Procedure: VIDEO BRONCHOSCOPY WITH ENDOBRONCHIAL ULTRASOUND;  Surgeon: Ottie Glazier, MD;  Location: [facility];  Service: Thoracic;  Laterality: N/A;    FAMILY HISTORY: family history includes Cancer in his sister.  SOCIAL HISTORY:  reports that he has been smoking cigarettes. He has a 86.00 pack-year smoking history. He has never used smokeless tobacco. He reports current alcohol use. He reports current drug use. Drug: Marijuana.  ALLERGIES: Keppra [levetiracetam], Chantix [varenicline], Wasp venom, and Penicillins  MEDICATIONS:  Current Outpatient Medications  Medication Sig Dispense Refill   Fluticasone-Umeclidin-Vilant (TRELEGY ELLIPTA) 100-62.5-25 MCG/ACT AEPB Inhale into the lungs.     nystatin (MYCOSTATIN) 100000 UNIT/ML suspension Take 5 mLs by mouth 4 (four) times daily.     acetaminophen (TYLENOL) 500 MG tablet Take 1,000 mg by mouth every 6 (six) hours as needed.     Cholecalciferol (VITAMIN D-1000 MAX ST) 25 MCG (1000 UT)  tablet Take by mouth.     cholecalciferol (VITAMIN D3) 25 MCG (1000 UNIT) tablet Take 1,000 Units by mouth daily.     Fluticasone-Umeclidin-Vilant (TRELEGY ELLIPTA) 100-62.5-25 MCG/ACT  AEPB Inhale 1 puff into the lungs every morning.     gabapentin (NEURONTIN) 300 MG capsule Take 1 capsule (300 mg) by mouth once a day for 1 week, then 1 capsule (300 mg) by mouth two times a day for 1 week, then 1 capsule (300 mg) by mouth three times a day. (Patient taking differently: Take 300-600 mg by mouth 3 (three) times daily. 2 tab in am, 1 tab at lunch and 1 tab in the evening) 120 capsule 5   lamoTRIgine (LAMICTAL) 100 MG tablet Take 1 tablet (100 mg total) by mouth 2 (two) times daily. 60 tablet 5   Multiple Vitamin (MULTI-VITAMIN) tablet Take 1 tablet by mouth daily.     nicotine (NICOTROL) 10 MG inhaler Inhale 1 continuous puffing into the lungs as needed for smoking cessation.     sulfamethoxazole-trimethoprim (BACTRIM) 400-80 MG tablet Take 1 tablet by mouth 2 (two) times daily.     No current facility-administered medications for this encounter.    ECOG PERFORMANCE STATUS:  0 - Asymptomatic  REVIEW OF SYSTEMS: Patient has history of acute encephalopathy acute epoxy respiratory failure, diabetes mellitus meningioma seizure history supraventricular tachycardia Patient denies any weight loss, fatigue, weakness, fever, chills or night sweats. Patient denies any loss of vision, blurred vision. Patient denies any ringing  of the ears or hearing loss. No irregular heartbeat. Patient denies heart murmur or history of fainting. Patient denies any chest pain or pain radiating to her upper extremities. Patient denies any shortness of breath, difficulty breathing at night, cough or hemoptysis. Patient denies any swelling in the lower legs. Patient denies any nausea vomiting, vomiting of blood, or coffee ground material in the vomitus. Patient denies any stomach pain. Patient states has had normal bowel movements no significant constipation or diarrhea. Patient denies any dysuria, hematuria or significant nocturia. Patient denies any problems walking, swelling in the joints or loss of balance.  Patient denies any skin changes, loss of hair or loss of weight. Patient denies any excessive worrying or anxiety or significant depression. Patient denies any problems with insomnia. Patient denies excessive thirst, polyuria, polydipsia. Patient denies any swollen glands, patient denies easy bruising or easy bleeding. Patient denies any recent infections, allergies or URI. Patient "s visual fields have not changed significantly in recent time.   PHYSICAL EXAM: BP 135/79   Pulse 92   Temp 98 F (36.7 C)   Resp 18   Ht 6' (1.829 m)   Wt 179 lb 12.8 oz (81.6 kg)   BMI 24.39 kg/m  Well-developed well-nourished patient in NAD. HEENT reveals PERLA, EOMI, discs not visualized.  Oral cavity is clear. No oral mucosal lesions are identified. Neck is clear without evidence of cervical or supraclavicular adenopathy. Lungs are clear to A&P. Cardiac examination is essentially unremarkable with regular rate and rhythm without murmur rub or thrill. Abdomen is benign with no organomegaly or masses noted. Motor sensory and DTR levels are equal and symmetric in the upper and lower extremities. Cranial nerves II through XII are grossly intact. Proprioception is intact. No peripheral adenopathy or edema is identified. No motor or sensory levels are noted. Crude visual fields are within normal range.  LABORATORY DATA: Pathology and cytology reports reviewed    RADIOLOGY RESULTS: CT scan PET CT scan MRI brain reviewed  IMPRESSION: Stage IIIa squamous cell carcinoma the left lower lobe in 62 year old male  PLAN: At this time I would recommend concurrent chemoradiation therapy.  I would use IMRT treatment planning and delivery and PET fusion study for treatment planning purposes.  I will plan on delivering 70 Gray over 7 weeks with concurrent chemotherapy.  Risks and benefits of treatment including possible cough dysphagia fatigue skin reaction alteration of blood counts and increasing cough all were discussed in  detail with the patient.  He seems to comprehend the treatment plan well.  I first personally set up and ordered CT simulation for later this week.  There will be extra effort by both professional staff as well as technical staff to coordinate and manage concurrent chemoradiation and ensuing side effects during his treatments.  Patient will see Dr. Grayland Ormond this after this appointment for recommendations regarding systemic treatment.  I would like to take this opportunity to thank you for allowing me to participate in the care of your patient.Noreene Filbert, MD

## 2022-11-11 NOTE — Progress Notes (Signed)
START ON PATHWAY REGIMEN - Non-Small Cell Lung     A cycle is every 7 days, concurrent with RT:     Paclitaxel      Carboplatin   **Always confirm dose/schedule in your pharmacy ordering system**  Patient Characteristics: Preoperative or Nonsurgical Candidate (Clinical Staging), Stage III - Nonsurgical Candidate (Nonsquamous and Squamous), PS = 0, 1 Therapeutic Status: Preoperative or Nonsurgical Candidate (Clinical Staging) AJCC T Category: cT4 AJCC N Category: cN1 AJCC M Category: cM0 AJCC 8 Stage Grouping: IIIA ECOG Performance Status: 0 Intent of Therapy: Curative Intent, Discussed with Patient

## 2022-11-11 NOTE — Progress Notes (Signed)
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  INTERVAL HISTORY: Patient is a 62 year old male who was recently diagnosed with the above-stated malignancy and is referred for definitive treatment.  He is anxious, but otherwise feels well.  He has no neurologic complaints.  He denies any recent fevers or illnesses.  He has a good appetite and denies weight loss.  He has no chest pain or shortness of, cough, or hemoptysis.  He denies any nausea, vomiting, constipation, or diarrhea.  He has no urinary complaints.  Patient offers no further specific complaints today.  REVIEW OF SYSTEMS:   Review of Systems  Constitutional: Negative.  Negative for fever, malaise/fatigue and weight loss.  Respiratory: Negative.  Negative for cough, hemoptysis and shortness of breath.   Cardiovascular: Negative.  Negative for chest pain and leg swelling.  Gastrointestinal: Negative.  Negative for abdominal pain.  Genitourinary: Negative.  Negative for dysuria.  Musculoskeletal: Negative.  Negative for back pain.  Skin: Negative.  Negative for rash.  Neurological: Negative.  Negative for dizziness, focal weakness, weakness and headaches.  Psychiatric/Behavioral: Negative.  The patient is not nervous/anxious.     As per HPI. Otherwise, a complete review of systems is negative.  PAST MEDICAL HISTORY: Past Medical History:  Diagnosis Date   Acute encephalopathy    Acute hypoxic respiratory failure (HCC)    Alcohol abuse    Allergy    Seasonal   Anemia     Ataxia    BPH associated with nocturia    Brain tumor (benign) (HCC)    Carpal tunnel syndrome    bilateral   COPD (chronic obstructive pulmonary disease) (HCC)    Depression    Diabetes mellitus without complication (HCC)    Dyspnea    Headache    Hypertension    Mass of lower lobe of left lung    Meningioma (HCC)    Moderate cognitive impairment    Neuropathy    Both legs   Seizures ([place])    last seizure was in January   SVT (supraventricular tachycardia) 2018   during intubation in ED    PAST SURGICAL HISTORY: Past Surgical History:  Procedure Laterality Date   CRANIOTOMY Left 11/06/2016   Procedure: LEFT Temporal CRANIOTOMY FOR TUMOR;  Surgeon: Ashok Pall, MD;  Location: [place];  Service: Neurosurgery;  Laterality: Left;  LEFT Temproal CRANIOTOMY FOR TUMOR    CRANIOTOMY Right 01/20/2020   Procedure: Right frontal craniotomy for tumor resection;  Surgeon: Ashok Pall, MD;  Location: [place];  Service: Neurosurgery;  Laterality: Right;   HERNIA REPAIR     UMBICIAL   VASECTOMY     VIDEO BRONCHOSCOPY WITH ENDOBRONCHIAL ULTRASOUND N/A 11/03/2022   Procedure: VIDEO BRONCHOSCOPY WITH ENDOBRONCHIAL ULTRASOUND;  Surgeon: Ottie Glazier, MD;  Location: [facility];  Service: Thoracic;  Laterality: N/A;    FAMILY HISTORY: Family History  Problem Relation Age of Onset   Cancer Sister        Breast    ADVANCED DIRECTIVES (Y/N):  N  HEALTH MAINTENANCE: Social History   Tobacco Use   Smoking status: Every Day  Packs/day: 2.00    Years: 43.00    Additional pack years: 0.00    Total pack years: 86.00    Types: Cigarettes   Smokeless tobacco: Never  Vaping Use   Vaping Use: Never used  Substance Use Topics   Alcohol use: Yes    Comment: 7 beers daily   Drug use: Yes    Types: Marijuana    Comment: everyday     Colonoscopy:  PAP:  Bone density:  Lipid panel:  Allergies  Allergen Reactions   Keppra [Levetiracetam] Anxiety and Other (See Comments)    Severe  mood changes   Chantix [Varenicline] Hives   Wasp Venom Swelling   Penicillins Rash    Has patient had a PCN reaction causing immediate rash, facial/tongue/throat swelling, SOB or lightheadedness with hypotension:YES Has patient had a PCN reaction causing severe rash involving mucus membranes or skin necrosis: NO Has patient had a PCN reaction that required hospitalization NO Has patient had a PCN reaction occurring within the last 10 years: NO If all of the above answers are "NO", then may proceed with Cephalosporin use.    Current Outpatient Medications  Medication Sig Dispense Refill   acetaminophen (TYLENOL) 500 MG tablet Take 1,000 mg by mouth every 6 (six) hours as needed.     Cholecalciferol (VITAMIN D-1000 MAX ST) 25 MCG (1000 UT) tablet Take by mouth.     cholecalciferol (VITAMIN D3) 25 MCG (1000 UNIT) tablet Take 1,000 Units by mouth daily.     Fluticasone-Umeclidin-Vilant (TRELEGY ELLIPTA) 100-62.5-25 MCG/ACT AEPB Inhale 1 puff into the lungs every morning.     Fluticasone-Umeclidin-Vilant (TRELEGY ELLIPTA) 100-62.5-25 MCG/ACT AEPB Inhale into the lungs.     gabapentin (NEURONTIN) 300 MG capsule Take 1 capsule (300 mg) by mouth once a day for 1 week, then 1 capsule (300 mg) by mouth two times a day for 1 week, then 1 capsule (300 mg) by mouth three times a day. (Patient taking differently: Take 300-600 mg by mouth 3 (three) times daily. 2 tab in am, 1 tab at lunch and 1 tab in the evening) 120 capsule 5   lamoTRIgine (LAMICTAL) 100 MG tablet Take 1 tablet (100 mg total) by mouth 2 (two) times daily. 60 tablet 5   Multiple Vitamin (MULTI-VITAMIN) tablet Take 1 tablet by mouth daily.     nicotine (NICOTROL) 10 MG inhaler Inhale 1 continuous puffing into the lungs as needed for smoking cessation.     nystatin (MYCOSTATIN) 100000 UNIT/ML suspension Take 5 mLs by mouth 4 (four) times daily.     sulfamethoxazole-trimethoprim (BACTRIM) 400-80 MG tablet Take 1 tablet by mouth 2 (two) times  daily.     lidocaine-prilocaine (EMLA) cream Apply to affected area once 30 g 3   ondansetron (ZOFRAN) 8 MG tablet Take 1 tablet (8 mg total) by mouth every 8 (eight) hours as needed for nausea or vomiting. Start on the third day after chemotherapy. 60 tablet 1   prochlorperazine (COMPAZINE) 10 MG tablet Take 1 tablet (10 mg total) by mouth every 6 (six) hours as needed for nausea or vomiting. 60 tablet 1   No current facility-administered medications for this visit.    OBJECTIVE: Vitals:   11/11/22 1116  BP: 135/79  Pulse: 92  Resp: 18  Temp: 98 F (36.7 C)  SpO2: 98%     Body mass index is 24.28 kg/m.    ECOG FS:0 - Asymptomatic  General: Well-developed, well-nourished, no acute distress. Eyes: Pink conjunctiva, anicteric sclera. HEENT: Normocephalic,  moist mucous membranes. Lungs: No audible wheezing or coughing. Heart: Regular rate and rhythm. Abdomen: Soft, nontender, no obvious distention. Musculoskeletal: No edema, cyanosis, or clubbing. Neuro: Alert, answering all questions appropriately. Cranial nerves grossly intact. Skin: No rashes or petechiae noted. Psych: Normal affect. Lymphatics: No cervical, calvicular, axillary or inguinal LAD.   LAB RESULTS:  Lab Results  Component Value Date   NA 134 (L) 11/11/2022   K 3.8 11/11/2022   CL 97 (L) 11/11/2022   CO2 28 11/11/2022   GLUCOSE 119 (H) 11/11/2022   BUN 7 (L) 11/11/2022   CREATININE 0.64 11/11/2022   CALCIUM 9.0 11/11/2022   PROT 8.6 (H) 11/11/2022   ALBUMIN 3.6 11/11/2022   AST 21 11/11/2022   ALT 12 11/11/2022   ALKPHOS 79 11/11/2022   BILITOT 0.7 11/11/2022   GFRNONAA >60 11/11/2022   GFRAA >60 01/18/2020    Lab Results  Component Value Date   WBC 10.9 (H) 11/11/2022   NEUTROABS 8.8 (H) 11/11/2022   HGB 13.8 11/11/2022   HCT 42.1 11/11/2022   MCV 93.6 11/11/2022   PLT 200 11/11/2022     STUDIES: CT CHEST WO CONTRAST  Result Date: 11/04/2022 CLINICAL DATA:  Bronchoscopy, lung mass.  *  Tracking Code: BO * EXAM: CT CHEST WITHOUT CONTRAST TECHNIQUE: Multidetector CT imaging of the chest was performed following the standard protocol without IV contrast. RADIATION DOSE REDUCTION: This exam was performed according to the departmental dose-optimization program which includes automated exposure control, adjustment of the mA and/or kV according to patient size and/or use of iterative reconstruction technique. COMPARISON:  PET 09/11/2022 and CT chest 06/13/2022. FINDINGS: Cardiovascular: Atherosclerotic calcification of the aorta, aortic valve and coronary arteries. Heart is enlarged. No pericardial effusion. Mediastinum/Nodes: Mediastinal lymph nodes measure up to 10 mm in the AP window, previously 8 mm. Left hilar adenopathy is poorly assessed without IV contrast. No axillary adenopathy. Esophagus is grossly unremarkable. Lungs/Pleura: Smoking related respiratory bronchiolitis. Masslike consolidation in the left lower lobe is similar in size to 09/11/2022, measuring approximately 7.9 x 9.5 cm, with extension to the left hilum. Associated obstruction of the basal segmental bronchi in the left lower lobe with bronchial or debris. Small left pleural effusion. Upper Abdomen: Visualized portion of the liver is unremarkable. There may be noncalcified stones in the gallbladder. Adrenal glands, kidneys, spleen, pancreas, stomach and bowel are grossly unremarkable. Upper abdominal lymph nodes are not enlarged by CT size criteria. Musculoskeletal: Degenerative changes in the spine. Old rib fractures. Old T11 and T12 compression fractures. No worrisome lytic or sclerotic lesions. IMPRESSION: 1. Left lower lobe mass with involvement of the left hilum and ipsilateral mediastinal lymph nodes, compatible with primary bronchogenic carcinoma, similar in appearance to 09/11/2022. 2. Obstructed basal segmental left lower lobe bronchi with postobstructive bronchial debris and peribronchovascular nodularity. 3. Small left  pleural effusion. 4. Question noncalcified gallstones. 5. Aortic atherosclerosis (ICD10-I70.0). Coronary artery calcification. Electronically Signed   By: Lorin Picket M.D.   On: 11/04/2022 13:35   DG Chest Port 1 View  Result Date: 11/03/2022 CLINICAL DATA:  Status post bronchoscopy. EXAM: PORTABLE CHEST 1 VIEW COMPARISON:  Chest CT 11/03/2022 FINDINGS: Stable dense airspace or calcification in the left lower lobe. No postprocedural hematoma or pneumothorax. IMPRESSION: 1. Stable dense airspace opacification in the left lower lobe. 2. No postprocedural hematoma or pneumothorax. Electronically Signed   By: Marijo Sanes M.D.   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  PET scan results from September 11, 2022 reviewed independently confirming stage of disease.  MRI of the brain on September 22, 2022 did not reveal any metastatic disease.  Patient had consultation with radiation oncology today.  He will benefit from concurrent chemotherapy with weekly carboplatin and Taxol with 5-8 cycles along with daily XRT.  Once he completes his concurrent treatment, he will benefit from maintenance durvalumab every 2 weeks for an entire year.  Patient will require port placement prior to initiating treatment.  Return to clinic on November 20, 2022 for further evaluation and initiation of cycle 1.  I spent a total of 60 minutes reviewing chart data, face-to-face evaluation with the patient, counseling and coordination of care as detailed above.   Patient expressed understanding and was in agreement with this plan. He also understands that He can call clinic at any time with any questions, concerns, or complaints.    Cancer Staging  Squamous cell carcinoma of lower lobe of left lung [facility]) Staging form: Lung, AJCC 8th Edition - Clinical  stage from 11/11/2022: Stage IIIA (cT4, cN1, cM0) - Signed by Lloyd Huger, MD on 11/11/2022 Stage prefix: Initial diagnosis   Lloyd Huger, MD   11/11/2022 12:42 PM

## 2022-11-13 ENCOUNTER — Inpatient Hospital Stay: Payer: Medicare HMO

## 2022-11-13 ENCOUNTER — Ambulatory Visit: Payer: Medicare HMO

## 2022-11-17 ENCOUNTER — Inpatient Hospital Stay: Payer: Medicare HMO | Attending: Oncology

## 2022-11-17 ENCOUNTER — Other Ambulatory Visit: Payer: Self-pay | Admitting: Student

## 2022-11-17 ENCOUNTER — Ambulatory Visit
Admission: RE | Admit: 2022-11-17 | Discharge: 2022-11-17 | Disposition: A | Discharge status: Home / Self Care | Payer: Medicare HMO | Source: Ambulatory Visit | Attending: Radiation Oncology | Admitting: Radiation Oncology

## 2022-11-17 DIAGNOSIS — C3432 Malignant neoplasm of lower lobe, left bronchus or lung: Secondary | ICD-10-CM

## 2022-11-17 DIAGNOSIS — Z510 Encounter for antineoplastic radiation therapy: Secondary | ICD-10-CM | POA: Insufficient documentation

## 2022-11-17 DIAGNOSIS — D649 Anemia, unspecified: Secondary | ICD-10-CM | POA: Insufficient documentation

## 2022-11-17 DIAGNOSIS — E871 Hypo-osmolality and hyponatremia: Secondary | ICD-10-CM | POA: Insufficient documentation

## 2022-11-17 DIAGNOSIS — Z5111 Encounter for antineoplastic chemotherapy: Secondary | ICD-10-CM | POA: Insufficient documentation

## 2022-11-17 DIAGNOSIS — Z79899 Other long term (current) drug therapy: Secondary | ICD-10-CM | POA: Insufficient documentation

## 2022-11-17 DIAGNOSIS — E119 Type 2 diabetes mellitus without complications: Secondary | ICD-10-CM

## 2022-11-17 DIAGNOSIS — F17210 Nicotine dependence, cigarettes, uncomplicated: Secondary | ICD-10-CM | POA: Diagnosis not present

## 2022-11-17 NOTE — Progress Notes (Signed)
Patient for IR Port Insertion on Tues 11/18/2022, I called and spoke with the patient on the phone and gave pre-procedure instructions. Pt was made aware to be here at 12:30p, NPO after MN prior to procedure as well as driver post procedure/recovery/discharge. Pt stated understanding.  Called 11/17/2022

## 2022-11-18 ENCOUNTER — Other Ambulatory Visit: Payer: Self-pay

## 2022-11-18 ENCOUNTER — Inpatient Hospital Stay: Payer: Medicare HMO

## 2022-11-18 ENCOUNTER — Ambulatory Visit
Admission: RE | Admit: 2022-11-18 | Discharge: 2022-11-18 | Disposition: A | Discharge status: Home / Self Care | Payer: Medicare HMO | Source: Ambulatory Visit | Attending: Oncology | Admitting: Oncology

## 2022-11-18 DIAGNOSIS — R569 Unspecified convulsions: Secondary | ICD-10-CM | POA: Diagnosis not present

## 2022-11-18 DIAGNOSIS — E119 Type 2 diabetes mellitus without complications: Secondary | ICD-10-CM | POA: Diagnosis not present

## 2022-11-18 DIAGNOSIS — F17210 Nicotine dependence, cigarettes, uncomplicated: Secondary | ICD-10-CM | POA: Diagnosis not present

## 2022-11-18 DIAGNOSIS — C3432 Malignant neoplasm of lower lobe, left bronchus or lung: Secondary | ICD-10-CM | POA: Diagnosis not present

## 2022-11-18 DIAGNOSIS — J449 Chronic obstructive pulmonary disease, unspecified: Secondary | ICD-10-CM | POA: Insufficient documentation

## 2022-11-18 DIAGNOSIS — R69 Illness, unspecified: Secondary | ICD-10-CM | POA: Diagnosis not present

## 2022-11-18 DIAGNOSIS — Z510 Encounter for antineoplastic radiation therapy: Secondary | ICD-10-CM | POA: Diagnosis not present

## 2022-11-18 DIAGNOSIS — I10 Essential (primary) hypertension: Secondary | ICD-10-CM | POA: Insufficient documentation

## 2022-11-18 DIAGNOSIS — Z452 Encounter for adjustment and management of vascular access device: Secondary | ICD-10-CM | POA: Diagnosis not present

## 2022-11-18 HISTORY — PX: IR IMAGING GUIDED PORT INSERTION: IMG5740

## 2022-11-18 MED ORDER — HEPARIN SOD (PORK) LOCK FLUSH 100 UNIT/ML IV SOLN
INTRAVENOUS | Status: AC
  Administered 2022-11-18: 500 [IU]
  Filled 2022-11-18: qty 5

## 2022-11-18 MED ORDER — MIDAZOLAM HCL 2 MG/2ML IJ SOLN
INTRAMUSCULAR | Status: AC | PRN
  Administered 2022-11-18 (×2): 1 mg via INTRAVENOUS

## 2022-11-18 MED ORDER — MIDAZOLAM HCL 2 MG/2ML IJ SOLN
INTRAMUSCULAR | Status: AC
  Filled 2022-11-18: qty 2

## 2022-11-18 MED ORDER — LIDOCAINE HCL 1 % IJ SOLN
INTRAMUSCULAR | Status: AC
  Administered 2022-11-18: 20 mL
  Filled 2022-11-18: qty 20

## 2022-11-18 MED ORDER — FENTANYL CITRATE (PF) 100 MCG/2ML IJ SOLN
INTRAMUSCULAR | Status: AC | PRN
  Administered 2022-11-18 (×2): 50 ug via INTRAVENOUS

## 2022-11-18 MED ORDER — FENTANYL CITRATE (PF) 100 MCG/2ML IJ SOLN
INTRAMUSCULAR | Status: AC
  Filled 2022-11-18: qty 2

## 2022-11-18 MED ORDER — SODIUM CHLORIDE 0.9 % IV SOLN: INTRAVENOUS | Status: DC

## 2022-11-18 NOTE — Procedures (Signed)
Interventional Radiology Procedure Note  Procedure: Placement of a right IJ approach single lumen PowerPort.  Tip is positioned at the superior cavoatrial junction and catheter is ready for immediate use.  Complications: None Recommendations:  - Ok to shower tomorrow - Do not submerge for 7 days - Routine line care   Signed,  Meridith Romick S. Braylinn Gulden, DO   

## 2022-11-18 NOTE — Progress Notes (Signed)
Patient clinically stable post Port placement per Dr Earleen Newport, tolerated well. Denies complaints post procedure. Received Versed 2 mg along with Fentanyl 100 mcg IV for procedure.report given to [employer] post procedure/331.

## 2022-11-18 NOTE — H&P (Signed)
Chief Complaint: Patient was seen in consultation today for squamous cell carcinoma of the left lung  Referring Physician(s): Finnegan,Timothy J  Supervising Physician: Corrie Mckusick  Patient Status: [facility] - Out-pt  History of Present Illness: Frederick Drake is a 62 y.o. male with PMH significant for alcohol abuse, COPD, diabetes mellitus, hypertension, and seizures being seen today in relation to squamous cell carcinoma of the left lung. The patient has been recently diagnosed with the above cancer, and is under the care of Dr Grayland Ormond from Oncology. Dr Grayland Ormond has referred the patient to IR for image-guided port placement to facilitate chemotherapy.  Past Medical History:  Diagnosis Date   Acute encephalopathy    Acute hypoxic respiratory failure (HCC)    Alcohol abuse    Allergy    Seasonal   Anemia    Ataxia    BPH associated with nocturia    Brain tumor (benign) (HCC)    Carpal tunnel syndrome    bilateral   COPD (chronic obstructive pulmonary disease) (HCC)    Depression    Diabetes mellitus without complication (HCC)    Dyspnea    Headache    Hypertension    Mass of lower lobe of left lung    Meningioma (HCC)    Moderate cognitive impairment    Neuropathy    Both legs   Seizures ([place])    last seizure was in January   SVT (supraventricular tachycardia) 2018   during intubation in ED    Past Surgical History:  Procedure Laterality Date   CRANIOTOMY Left 11/06/2016   Procedure: LEFT Temporal CRANIOTOMY FOR TUMOR;  Surgeon: Ashok Pall, MD;  Location: [place];  Service: Neurosurgery;  Laterality: Left;  LEFT Temproal CRANIOTOMY FOR TUMOR    CRANIOTOMY Right 01/20/2020   Procedure: Right frontal craniotomy for tumor resection;  Surgeon: Ashok Pall, MD;  Location: [place];  Service: Neurosurgery;  Laterality: Right;   HERNIA REPAIR     UMBICIAL   VASECTOMY     VIDEO BRONCHOSCOPY WITH ENDOBRONCHIAL ULTRASOUND N/A 11/03/2022   Procedure: VIDEO BRONCHOSCOPY WITH  ENDOBRONCHIAL ULTRASOUND;  Surgeon: Ottie Glazier, MD;  Location: [facility];  Service: Thoracic;  Laterality: N/A;    Allergies: Keppra [levetiracetam], Chantix [varenicline], Wasp venom, and Penicillins  Medications: Prior to Admission medications   Medication Sig Start Date End Date Taking? Authorizing Provider  acetaminophen (TYLENOL) 500 MG tablet Take 1,000 mg by mouth every 6 (six) hours as needed.    [provider]  Cholecalciferol (VITAMIN D-1000 MAX ST) 25 MCG (1000 UT) tablet Take by mouth.    [provider]  cholecalciferol (VITAMIN D3) 25 MCG (1000 UNIT) tablet Take 1,000 Units by mouth daily.    [provider]  Fluticasone-Umeclidin-Vilant (TRELEGY ELLIPTA) 100-62.5-25 MCG/ACT AEPB Inhale 1 puff into the lungs every morning.    [provider]  Fluticasone-Umeclidin-Vilant (TRELEGY ELLIPTA) 100-62.5-25 MCG/ACT AEPB Inhale into the lungs. 07/22/22   [provider]  gabapentin (NEURONTIN) 300 MG capsule Take 1 capsule (300 mg) by mouth once a day for 1 week, then 1 capsule (300 mg) by mouth two times a day for 1 week, then 1 capsule (300 mg) by mouth three times a day. Patient taking differently: Take 300-600 mg by mouth 3 (three) times daily. 2 tab in am, 1 tab at lunch and 1 tab in the evening 12/13/21     lamoTRIgine (LAMICTAL) 100 MG tablet Take 1 tablet (100 mg total) by mouth 2 (two) times daily. 02/07/21  lidocaine-prilocaine (EMLA) cream Apply to affected area once 11/11/22   Lloyd Huger, MD  Multiple Vitamin (MULTI-VITAMIN) tablet Take 1 tablet by mouth daily.    [provider]  nicotine (NICOTROL) 10 MG inhaler Inhale 1 continuous puffing into the lungs as needed for smoking cessation.    [provider]  nystatin (MYCOSTATIN) 100000 UNIT/ML suspension Take 5 mLs by mouth 4 (four) times daily. 10/20/22   [provider]  ondansetron (ZOFRAN) 8 MG tablet Take 1 tablet (8 mg total) by mouth  every 8 (eight) hours as needed for nausea or vomiting. Start on the third day after chemotherapy. 11/11/22   Lloyd Huger, MD  prochlorperazine (COMPAZINE) 10 MG tablet Take 1 tablet (10 mg total) by mouth every 6 (six) hours as needed for nausea or vomiting. 11/11/22   Lloyd Huger, MD  sulfamethoxazole-trimethoprim (BACTRIM) 400-80 MG tablet Take 1 tablet by mouth 2 (two) times daily.    [provider]     Family History  Problem Relation Age of Onset   Cancer Sister        Breast    Social History   Socioeconomic History   Marital status: Married    Spouse name: Not on file   Number of children: Not on file   Years of education: Not on file   Highest education level: Not on file  Occupational History   Not on file  Tobacco Use   Smoking status: Every Day    Packs/day: 2.00    Years: 43.00    Additional pack years: 0.00    Total pack years: 86.00    Types: Cigarettes   Smokeless tobacco: Never  Vaping Use   Vaping Use: Never used  Substance and Sexual Activity   Alcohol use: Yes    Comment: 7 beers daily   Drug use: Yes    Types: Marijuana    Comment: everyday   Sexual activity: Not on file  Other Topics Concern   Not on file  Social History Narrative   Lives at home with wife. Patient does not have a good memory and has issues with transportation.    Social Determinants of Health   Financial Resource Strain: Not on file  Food Insecurity: Not on file  Transportation Needs: Unmet Transportation Needs (11/17/2022)   PRAPARE - Hydrologist (Medical): Yes    Lack of Transportation (Non-Medical): Yes  Physical Activity: Not on file  Stress: Not on file  Social Connections: Not on file    Code Status: Full Code  Review of Systems: A 12 point ROS discussed and pertinent positives are indicated in the HPI above.  All other systems are negative.  Review of Systems  Constitutional:  Negative for chills and fever.   Respiratory:  Negative for chest tightness and shortness of breath.   Cardiovascular:  Negative for chest pain and leg swelling.  Gastrointestinal:  Negative for abdominal pain, diarrhea, nausea and vomiting.  Neurological:  Positive for seizures. Negative for dizziness and headaches.       Patient states that he has seizures but that he has not had any recently  Psychiatric/Behavioral:  Negative for confusion.     Vital Signs: BP (!) 142/85   Pulse (!) 110   Temp 98.5 F (36.9 C) (Oral)   Resp (!) 24   Ht 6' (1.829 m)   Wt 180 lb (81.6 kg)   SpO2 96%   BMI 24.41 kg/m  Physical Exam Vitals reviewed.  Constitutional:      General: He is not in acute distress.    Appearance: He is ill-appearing.  HENT:     Mouth/Throat:     Mouth: Mucous membranes are moist.  Eyes:     Pupils: Pupils are equal, round, and reactive to light.  Cardiovascular:     Rate and Rhythm: Regular rhythm. Tachycardia present.     Pulses: Normal pulses.     Heart sounds: Normal heart sounds.  Pulmonary:     Effort: Pulmonary effort is normal.     Breath sounds: Normal breath sounds.  Abdominal:     Palpations: Abdomen is soft.     Tenderness: There is no abdominal tenderness.  Musculoskeletal:     Right lower leg: No edema.     Left lower leg: No edema.  Skin:    General: Skin is warm and dry.  Neurological:     Mental Status: He is alert and oriented to person, place, and time.  Psychiatric:        Mood and Affect: Mood normal.        Behavior: Behavior normal.     Imaging: CT CHEST WO CONTRAST  Result Date: 11/04/2022 CLINICAL DATA:  Bronchoscopy, lung mass.  * Tracking Code: BO * EXAM: CT CHEST WITHOUT CONTRAST TECHNIQUE: Multidetector CT imaging of the chest was performed following the standard protocol without IV contrast. RADIATION DOSE REDUCTION: This exam was performed according to the departmental dose-optimization program which includes automated exposure control, adjustment  of the mA and/or kV according to patient size and/or use of iterative reconstruction technique. COMPARISON:  PET 09/11/2022 and CT chest 06/13/2022. FINDINGS: Cardiovascular: Atherosclerotic calcification of the aorta, aortic valve and coronary arteries. Heart is enlarged. No pericardial effusion. Mediastinum/Nodes: Mediastinal lymph nodes measure up to 10 mm in the AP window, previously 8 mm. Left hilar adenopathy is poorly assessed without IV contrast. No axillary adenopathy. Esophagus is grossly unremarkable. Lungs/Pleura: Smoking related respiratory bronchiolitis. Masslike consolidation in the left lower lobe is similar in size to 09/11/2022, measuring approximately 7.9 x 9.5 cm, with extension to the left hilum. Associated obstruction of the basal segmental bronchi in the left lower lobe with bronchial or debris. Small left pleural effusion. Upper Abdomen: Visualized portion of the liver is unremarkable. There may be noncalcified stones in the gallbladder. Adrenal glands, kidneys, spleen, pancreas, stomach and bowel are grossly unremarkable. Upper abdominal lymph nodes are not enlarged by CT size criteria. Musculoskeletal: Degenerative changes in the spine. Old rib fractures. Old T11 and T12 compression fractures. No worrisome lytic or sclerotic lesions. IMPRESSION: 1. Left lower lobe mass with involvement of the left hilum and ipsilateral mediastinal lymph nodes, compatible with primary bronchogenic carcinoma, similar in appearance to 09/11/2022. 2. Obstructed basal segmental left lower lobe bronchi with postobstructive bronchial debris and peribronchovascular nodularity. 3. Small left pleural effusion. 4. Question noncalcified gallstones. 5. Aortic atherosclerosis (ICD10-I70.0). Coronary artery calcification. Electronically Signed   By: Lorin Picket M.D.   On: 11/04/2022 13:35   DG Chest Port 1 View  Result Date: 11/03/2022 CLINICAL DATA:  Status post bronchoscopy. EXAM: PORTABLE CHEST 1 VIEW  COMPARISON:  Chest CT 11/03/2022 FINDINGS: Stable dense airspace or calcification in the left lower lobe. No postprocedural hematoma or pneumothorax. IMPRESSION: 1. Stable dense airspace opacification in the left lower lobe. 2. No postprocedural hematoma or pneumothorax. Electronically Signed   By: Marijo Sanes M.D.   On: 11/03/2022 14:52    Labs:  CBC:  Recent Labs    11/11/22 1207  WBC 10.9*  HGB 13.8  HCT 42.1  PLT 200    COAGS: Recent Labs    09/24/22 1126  INR 1.1    BMP: Recent Labs    11/11/22 1206  NA 134*  K 3.8  CL 97*  CO2 28  GLUCOSE 119*  BUN 7*  CALCIUM 9.0  CREATININE 0.64  GFRNONAA >60    LIVER FUNCTION TESTS: Recent Labs    11/11/22 1206  BILITOT 0.7  AST 21  ALT 12  ALKPHOS 79  PROT 8.6*  ALBUMIN 3.6    TUMOR MARKERS: No results for input(s): "AFPTM", "CEA", "CA199", "CHROMGRNA" in the last 8760 hours.  Assessment and Plan:  Tabor Trussell is a 62 yo male with squamous cell carcinoma of the left lung.He is under the care of Dr Grayland Ormond from Oncology and is set to begin chemotherapy treatments on 11/20/22. The patient presents today in his usual state of health. He is NPO. Case has been reviewed with Dr Earleen Newport. Patient is to undergo image-guided port placement to facilitate chemotherapy.  Risks and benefits of image guided port-a-catheter placement was discussed with the patient including, but not limited to bleeding, infection, pneumothorax, or fibrin sheath development and need for additional procedures.  All of the patient's questions were answered, patient is agreeable to proceed. Consent signed and in chart.   Thank you for this interesting consult.  I greatly enjoyed meeting FILOMENO FRILOT and look forward to participating in their care.  A copy of this report was sent to the requesting provider on this date.  Electronically Signed: Lura Em, PA-C 11/18/2022, 9:14 AM   I spent a total of 30 Minutes   in face to face in  clinical consultation, greater than 50% of which was counseling/coordinating care for squamous cell carcinoma of the left lung.

## 2022-11-19 ENCOUNTER — Inpatient Hospital Stay: Payer: Medicare HMO | Admitting: *Deleted

## 2022-11-19 ENCOUNTER — Ambulatory Visit: Payer: Medicare HMO

## 2022-11-19 DIAGNOSIS — C3432 Malignant neoplasm of lower lobe, left bronchus or lung: Secondary | ICD-10-CM

## 2022-11-19 MED FILL — Dexamethasone Sodium Phosphate Inj 100 MG/10ML: INTRAMUSCULAR | Qty: 1 | Status: AC

## 2022-11-19 NOTE — Progress Notes (Signed)
Multidisciplinary Oncology Council Documentation  Frederick Drake was presented by our [facility] on 11/19/2022, which included representatives from:  Palliative Care Dietitian  Physical/Occupational Therapist Nurse Navigator Genetics Speech Therapist Social work Survivorship RN Financial Navigator Research RN   Frederick Drake currently presents with history of lung cancer  We reviewed previous medical and familial history, history of present illness, and recent lab results along with all available histopathologic and imaging studies. The [name] considered available treatment options and made the following recommendations/referrals:  - social work  The [employer] is a Occupational psychologist from various specialty areas who evaluate and discuss patients for whom a multidisciplinary approach is being considered. Final determinations in the plan of care are those of the provider(s).   Today's extended care, comprehensive team conference, Frederick Drake was not present for the discussion and was not examined.

## 2022-11-20 ENCOUNTER — Inpatient Hospital Stay: Payer: Medicare HMO

## 2022-11-20 ENCOUNTER — Other Ambulatory Visit: Payer: Self-pay

## 2022-11-20 ENCOUNTER — Encounter: Payer: Self-pay | Admitting: *Deleted

## 2022-11-20 ENCOUNTER — Inpatient Hospital Stay (HOSPITAL_BASED_OUTPATIENT_CLINIC_OR_DEPARTMENT_OTHER): Payer: Medicare HMO | Admitting: Oncology

## 2022-11-20 ENCOUNTER — Encounter: Payer: Self-pay | Admitting: Oncology

## 2022-11-20 ENCOUNTER — Ambulatory Visit: Payer: Medicare HMO

## 2022-11-20 VITALS — BP 129/79 | HR 91 | Temp 96.0°F | Resp 18

## 2022-11-20 DIAGNOSIS — Z79899 Other long term (current) drug therapy: Secondary | ICD-10-CM | POA: Diagnosis not present

## 2022-11-20 DIAGNOSIS — Z5111 Encounter for antineoplastic chemotherapy: Secondary | ICD-10-CM | POA: Diagnosis not present

## 2022-11-20 DIAGNOSIS — C3432 Malignant neoplasm of lower lobe, left bronchus or lung: Secondary | ICD-10-CM

## 2022-11-20 DIAGNOSIS — D649 Anemia, unspecified: Secondary | ICD-10-CM | POA: Diagnosis not present

## 2022-11-20 DIAGNOSIS — E871 Hypo-osmolality and hyponatremia: Secondary | ICD-10-CM | POA: Diagnosis not present

## 2022-11-20 LAB — CBC WITH DIFFERENTIAL (CANCER CENTER ONLY)
Abs Immature Granulocytes: 0.03 10*3/uL (ref 0.00–0.07)
Basophils Absolute: 0.1 10*3/uL (ref 0.0–0.1)
Basophils Relative: 1 %
Eosinophils Absolute: 0.1 10*3/uL (ref 0.0–0.5)
Eosinophils Relative: 3 %
HCT: 40.6 % (ref 39.0–52.0)
Hemoglobin: 13.8 g/dL (ref 13.0–17.0)
Immature Granulocytes: 1 %
Lymphocytes Relative: 28 %
Lymphs Abs: 1.4 10*3/uL (ref 0.7–4.0)
MCH: 32.2 pg (ref 26.0–34.0)
MCHC: 34 g/dL (ref 30.0–36.0)
MCV: 94.6 fL (ref 80.0–100.0)
Monocytes Absolute: 0.4 10*3/uL (ref 0.1–1.0)
Monocytes Relative: 9 %
Neutro Abs: 2.9 10*3/uL (ref 1.7–7.7)
Neutrophils Relative %: 58 %
Platelet Count: 286 10*3/uL (ref 150–400)
RBC: 4.29 MIL/uL (ref 4.22–5.81)
RDW: 13.9 % (ref 11.5–15.5)
WBC Count: 5 10*3/uL (ref 4.0–10.5)
nRBC: 0 % (ref 0.0–0.2)

## 2022-11-20 LAB — CMP (CANCER CENTER ONLY)
ALT: 12 U/L (ref 0–44)
AST: 20 U/L (ref 15–41)
Albumin: 3.2 g/dL — ABNORMAL LOW (ref 3.5–5.0)
Alkaline Phosphatase: 84 U/L (ref 38–126)
Anion gap: 9 (ref 5–15)
BUN: 6 mg/dL — ABNORMAL LOW (ref 8–23)
CO2: 25 mmol/L (ref 22–32)
Calcium: 8.6 mg/dL — ABNORMAL LOW (ref 8.9–10.3)
Chloride: 101 mmol/L (ref 98–111)
Creatinine: 0.67 mg/dL (ref 0.61–1.24)
GFR, Estimated: >60 mL/min (ref >60)
Glucose, Bld: 122 mg/dL — ABNORMAL HIGH (ref 70–99)
Potassium: 3.7 mmol/L (ref 3.5–5.1)
Sodium: 135 mmol/L (ref 135–145)
Total Bilirubin: 0.3 mg/dL (ref 0.3–1.2)
Total Protein: 7.6 g/dL (ref 6.5–8.1)

## 2022-11-20 MED ORDER — SODIUM CHLORIDE 0.9 % IV SOLN
10.0000 mg | Freq: Once | INTRAVENOUS | Status: AC
  Administered 2022-11-20: 10 mg via INTRAVENOUS
  Filled 2022-11-20: qty 10

## 2022-11-20 MED ORDER — HEPARIN SOD (PORK) LOCK FLUSH 100 UNIT/ML IV SOLN
INTRAVENOUS | Status: AC
  Administered 2022-11-20: 500 [IU]
  Filled 2022-11-20: qty 5

## 2022-11-20 MED ORDER — HEPARIN SOD (PORK) LOCK FLUSH 100 UNIT/ML IV SOLN
500.0000 [IU] | Freq: Once | INTRAVENOUS | Status: AC | PRN
  Filled 2022-11-20: qty 5

## 2022-11-20 MED ORDER — FAMOTIDINE IN NACL 20-0.9 MG/50ML-% IV SOLN
20.0000 mg | Freq: Once | INTRAVENOUS | Status: AC
  Administered 2022-11-20: 20 mg via INTRAVENOUS
  Filled 2022-11-20: qty 50

## 2022-11-20 MED ORDER — SODIUM CHLORIDE 0.9 % IV SOLN
45.0000 mg/m2 | Freq: Once | INTRAVENOUS | Status: AC
  Administered 2022-11-20: 90 mg via INTRAVENOUS
  Filled 2022-11-20: qty 15

## 2022-11-20 MED ORDER — SODIUM CHLORIDE 0.9 % IV SOLN
Freq: Once | INTRAVENOUS | Status: AC
  Filled 2022-11-20: qty 250

## 2022-11-20 MED ORDER — PALONOSETRON HCL INJECTION 0.25 MG/5ML
0.2500 mg | Freq: Once | INTRAVENOUS | Status: AC
  Administered 2022-11-20: 0.25 mg via INTRAVENOUS
  Filled 2022-11-20: qty 5

## 2022-11-20 MED ORDER — SODIUM CHLORIDE 0.9 % IV SOLN
272.8000 mg | Freq: Once | INTRAVENOUS | Status: AC
  Administered 2022-11-20: 270 mg via INTRAVENOUS
  Filled 2022-11-20: qty 27

## 2022-11-20 MED ORDER — DIPHENHYDRAMINE HCL 50 MG/ML IJ SOLN
25.0000 mg | Freq: Once | INTRAMUSCULAR | Status: AC
  Administered 2022-11-20: 25 mg via INTRAVENOUS
  Filled 2022-11-20: qty 1

## 2022-11-20 NOTE — Progress Notes (Signed)
Met with patient and his wife during follow up visit with Dr. Grayland Ormond to start weekly chemotherapy treatments with daily radiation. Pt did not have any questions or needs during visit. Contact info given and instructed to call with any questions or needs. Informed pt that will be given print out of appts prior to leaving today. Nothing further needed at this time. Will follow up at next chemo treatment on 4/11.

## 2022-11-20 NOTE — Patient Instructions (Signed)
[facility]  Discharge Instructions: Thank you for choosing [facility] to provide your oncology and hematology care.  If you have a lab appointment with the [facility], please go directly to the [facility] and check in at the registration area.  Wear comfortable clothing and clothing appropriate for easy access to any Portacath or PICC line.   We strive to give you quality time with your provider. You may need to reschedule your appointment if you arrive late (15 or more minutes).  Arriving late affects you and other patients whose appointments are after yours.  Also, if you miss three or more appointments without notifying the office, you may be dismissed from the clinic at the provider's discretion.      For prescription refill requests, have your pharmacy contact our office and allow 72 hours for refills to be completed.    Today you received the following chemotherapy and/or immunotherapy agents Taxol and Carboplatin       To help prevent nausea and vomiting after your treatment, we encourage you to take your nausea medication as directed.  BELOW ARE SYMPTOMS THAT SHOULD BE REPORTED IMMEDIATELY: *FEVER GREATER THAN 100.4 F (38 C) OR HIGHER *CHILLS OR SWEATING *NAUSEA AND VOMITING THAT IS NOT CONTROLLED WITH YOUR NAUSEA MEDICATION *UNUSUAL SHORTNESS OF BREATH *UNUSUAL BRUISING OR BLEEDING *URINARY PROBLEMS (pain or burning when urinating, or frequent urination) *BOWEL PROBLEMS (unusual diarrhea, constipation, pain near the anus) TENDERNESS IN MOUTH AND THROAT WITH OR WITHOUT PRESENCE OF ULCERS (sore throat, sores in mouth, or a toothache) UNUSUAL RASH, SWELLING OR PAIN  UNUSUAL VAGINAL DISCHARGE OR ITCHING   Items with * indicate a potential emergency and should be followed up as soon as possible or go to the Emergency Department if any problems should occur.  Please show the CHEMOTHERAPY ALERT CARD or IMMUNOTHERAPY ALERT CARD at  check-in to the Emergency Department and triage nurse.  Should you have questions after your visit or need to cancel or reschedule your appointment, please contact [facility]  [phone] and follow the prompts.  Office hours are 8:00 a.m. to 4:30 p.m. Monday - Friday. Please note that voicemails left after 4:00 p.m. may not be returned until the following business day.  We are closed weekends and major holidays. You have access to a nurse at all times for urgent questions. Please call the main number to the clinic [phone] and follow the prompts.  For any non-urgent questions, you may also contact your provider using MyChart. We now offer e-Visits for anyone 18 and older to request care online for non-urgent symptoms. For details visit [email or web address].   Also download the MyChart app! Go to the app store, search "MyChart", open the app, select [facility], and log in with your MyChart username and password.     Paclitaxel Injection What is this medication? PACLITAXEL (PAK li TAX el) treats some types of cancer. It works by slowing down the growth of cancer cells. This medicine may be used for other purposes; ask your health care provider or pharmacist if you have questions. COMMON BRAND NAME(S): Onxol, Taxol What should I tell my care team before I take this medication? They need to know if you have any of these conditions: Heart disease Liver disease Low white blood cell levels An unusual or allergic reaction to paclitaxel, other medications, foods, dyes, or preservatives If you or your partner are pregnant or trying to get pregnant Breast-feeding   How should I use this medication? This medication is injected into a vein. It is given by your care team in a hospital or clinic setting. Talk to your care team about the use of this medication in children. While it may be given to children for selected conditions, precautions do  apply. Overdosage: If you think you have taken too much of this medicine contact a poison control [facility] or emergency room at once. NOTE: This medicine is only for you. Do not share this medicine with others. What if I miss a dose? Keep appointments for follow-up doses. It is important not to miss your dose. Call your care team if you are unable to keep an appointment. What may interact with this medication? Do not take this medication with any of the following: Live virus vaccines Other medications may affect the way this medication works. Talk with your care team about all of the medications you take. They may suggest changes to your treatment plan to lower the risk of side effects and to make sure your medications work as intended. This list may not describe all possible interactions. Give your health care provider a list of all the medicines, herbs, non-prescription drugs, or dietary supplements you use. Also tell them if you smoke, drink alcohol, or use illegal drugs. Some items may interact with your medicine. What should I watch for while using this medication? Your condition will be monitored carefully while you are receiving this medication. You may need blood work while taking this medication. This medication may make you feel generally unwell. This is not uncommon as chemotherapy can affect healthy cells as well as cancer cells. Report any side effects. Continue your course of treatment even though you feel ill unless your care team tells you to stop. This medication can cause serious allergic reactions. To reduce the risk, your care team may give you other medications to take before receiving this one. Be sure to follow the directions from your care team. This medication may increase your risk of getting an infection. Call your care team for advice if you get a fever, chills, sore throat, or other symptoms of a cold or flu. Do not treat yourself. Try to avoid being around people who are  sick. This medication may increase your risk to bruise or bleed. Call your care team if you notice any unusual bleeding. Be careful brushing or flossing your teeth or using a toothpick because you may get an infection or bleed more easily. If you have any dental work done, tell your dentist you are receiving this medication. Talk to your care team if you may be pregnant. Serious birth defects can occur if you take this medication during pregnancy. Talk to your care team before breastfeeding. Changes to your treatment plan may be needed. What side effects may I notice from receiving this medication? Side effects that you should report to your care team as soon as possible: Allergic reactions--skin rash, itching, hives, swelling of the face, lips, tongue, or throat Heart rhythm changes--fast or irregular heartbeat, dizziness, feeling faint or lightheaded, chest pain, trouble breathing Increase in blood pressure Infection--fever, chills, cough, sore throat, wounds that don't heal, pain or trouble when passing urine, general feeling of discomfort or being unwell Low blood pressure--dizziness, feeling faint or lightheaded, blurry vision Low red blood cell level--unusual weakness or fatigue, dizziness, headache, trouble breathing Painful swelling, warmth, or redness of the skin, blisters or sores at the infusion site Pain, tingling, or numbness in the hands   or feet Slow heartbeat--dizziness, feeling faint or lightheaded, confusion, trouble breathing, unusual weakness or fatigue Unusual bruising or bleeding Side effects that usually do not require medical attention (report to your care team if they continue or are bothersome): Diarrhea Hair loss Joint pain Loss of appetite Muscle pain Nausea Vomiting This list may not describe all possible side effects. Call your doctor for medical advice about side effects. You may report side effects to FDA at 1-800-FDA-1088. Where should I keep my  medication? This medication is given in a hospital or clinic. It will not be stored at home. NOTE: This sheet is a summary. It may not cover all possible information. If you have questions about this medicine, talk to your doctor, pharmacist, or health care provider.  2023 Elsevier/Gold Standard (2021-12-19 00:00:00)    Carboplatin Injection What is this medication? CARBOPLATIN (KAR boe pla tin) treats some types of cancer. It works by slowing down the growth of cancer cells. This medicine may be used for other purposes; ask your health care provider or pharmacist if you have questions. COMMON BRAND NAME(S): Paraplatin What should I tell my care team before I take this medication? They need to know if you have any of these conditions: Blood disorders Hearing problems Kidney disease Recent or ongoing radiation therapy An unusual or allergic reaction to carboplatin, cisplatin, other medications, foods, dyes, or preservatives Pregnant or trying to get pregnant Breast-feeding How should I use this medication? This medication is injected into a vein. It is given by your care team in a hospital or clinic setting. Talk to your care team about the use of this medication in children. Special care may be needed. Overdosage: If you think you have taken too much of this medicine contact a poison control [facility] or emergency room at once. NOTE: This medicine is only for you. Do not share this medicine with others. What if I miss a dose? Keep appointments for follow-up doses. It is important not to miss your dose. Call your care team if you are unable to keep an appointment. What may interact with this medication? Medications for seizures Some antibiotics, such as amikacin, gentamicin, neomycin, streptomycin, tobramycin Vaccines This list may not describe all possible interactions. Give your health care provider a list of all the medicines, herbs, non-prescription drugs, or dietary supplements you  use. Also tell them if you smoke, drink alcohol, or use illegal drugs. Some items may interact with your medicine. What should I watch for while using this medication? Your condition will be monitored carefully while you are receiving this medication. You may need blood work while taking this medication. This medication may make you feel generally unwell. This is not uncommon, as chemotherapy can affect healthy cells as well as cancer cells. Report any side effects. Continue your course of treatment even though you feel ill unless your care team tells you to stop. In some cases, you may be given additional medications to help with side effects. Follow all directions for their use. This medication may increase your risk of getting an infection. Call your care team for advice if you get a fever, chills, sore throat, or other symptoms of a cold or flu. Do not treat yourself. Try to avoid being around people who are sick. Avoid taking medications that contain aspirin, acetaminophen, ibuprofen, naproxen, or ketoprofen unless instructed by your care team. These medications may hide a fever. Be careful brushing or flossing your teeth or using a toothpick because you may get an infection   or bleed more easily. If you have any dental work done, tell your dentist you are receiving this medication. Talk to your care team if you wish to become pregnant or think you might be pregnant. This medication can cause serious birth defects. Talk to your care team about effective forms of contraception. Do not breast-feed while taking this medication. What side effects may I notice from receiving this medication? Side effects that you should report to your care team as soon as possible: Allergic reactions--skin rash, itching, hives, swelling of the face, lips, tongue, or throat Infection--fever, chills, cough, sore throat, wounds that don't heal, pain or trouble when passing urine, general feeling of discomfort or being  unwell Low red blood cell level--unusual weakness or fatigue, dizziness, headache, trouble breathing Pain, tingling, or numbness in the hands or feet, muscle weakness, change in vision, confusion or trouble speaking, loss of balance or coordination, trouble walking, seizures Unusual bruising or bleeding Side effects that usually do not require medical attention (report to your care team if they continue or are bothersome): Hair loss Nausea Unusual weakness or fatigue Vomiting This list may not describe all possible side effects. Call your doctor for medical advice about side effects. You may report side effects to FDA at 1-800-FDA-1088. Where should I keep my medication? This medication is given in a hospital or clinic. It will not be stored at home. NOTE: This sheet is a summary. It may not cover all possible information. If you have questions about this medicine, talk to your doctor, pharmacist, or health care provider.  2023 Elsevier/Gold Standard (2007-09-25 00:00:00)   

## 2022-11-20 NOTE — Progress Notes (Signed)
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  INTERVAL HISTORY: Patient returns to clinic today for further evaluation and initiation of cycle 1 of weekly carboplatin and Taxol.  He will start XRT on Monday.  He currently feels well and is asymptomatic. He has no neurologic complaints.  He denies any recent fevers or illnesses.  He has a good appetite and denies weight loss.  He denies any chest pain, shortness of breath, cough, or hemoptysis.  He denies any nausea, vomiting, constipation, or diarrhea.  He has no urinary complaints.  Patient offers no specific complaints today.  REVIEW OF SYSTEMS:   Review of Systems  Constitutional: Negative.  Negative for fever, malaise/fatigue and weight loss.  Respiratory: Negative.  Negative for cough, hemoptysis and shortness of breath.   Cardiovascular: Negative.  Negative for chest pain and leg swelling.  Gastrointestinal: Negative.  Negative for abdominal pain.  Genitourinary: Negative.  Negative for dysuria.  Musculoskeletal: Negative.  Negative for back pain.  Skin: Negative.  Negative for rash.  Neurological: Negative.  Negative for dizziness, focal weakness, weakness and headaches.  Psychiatric/Behavioral: Negative.  The patient is not nervous/anxious.     As per HPI. Otherwise, a complete review of systems is negative.  PAST MEDICAL HISTORY: Past Medical History:  Diagnosis Date   Acute encephalopathy    Acute hypoxic respiratory  failure    Alcohol abuse    Allergy    Seasonal   Anemia    Ataxia    BPH associated with nocturia    Brain tumor (benign)    Carpal tunnel syndrome    bilateral   COPD (chronic obstructive pulmonary disease)    Depression    Diabetes mellitus without complication    Dyspnea    Headache    Hypertension    Mass of lower lobe of left lung    Meningioma    Moderate cognitive impairment    Neuropathy    Both legs   Seizures    last seizure was in January   SVT (supraventricular tachycardia) 2018   during intubation in ED    PAST SURGICAL HISTORY: Past Surgical History:  Procedure Laterality Date   CRANIOTOMY Left 11/06/2016   Procedure: LEFT Temporal CRANIOTOMY FOR TUMOR;  Surgeon: Ashok Pall, MD;  Location: [facility];  Service: Neurosurgery;  Laterality: Left;  LEFT Temproal CRANIOTOMY FOR TUMOR    CRANIOTOMY Right 01/20/2020   Procedure: Right frontal craniotomy for tumor resection;  Surgeon: Ashok Pall, MD;  Location: [facility];  Service: Neurosurgery;  Laterality: Right;   HERNIA REPAIR     UMBICIAL   IR IMAGING GUIDED PORT INSERTION  11/18/2022   VASECTOMY     VIDEO BRONCHOSCOPY WITH ENDOBRONCHIAL ULTRASOUND N/A 11/03/2022   Procedure: VIDEO BRONCHOSCOPY WITH ENDOBRONCHIAL ULTRASOUND;  Surgeon: Ottie Glazier, MD;  Location: [facility];  Service: Thoracic;  Laterality: N/A;    FAMILY HISTORY: Family History  Problem Relation Age of Onset   Cancer Sister        Breast    ADVANCED DIRECTIVES (Y/N):  N  HEALTH MAINTENANCE: Social  History   Tobacco Use   Smoking status: Every Day    Packs/day: 2.00    Years: 43.00    Additional pack years: 0.00    Total pack years: 86.00    Types: Cigarettes   Smokeless tobacco: Never  Vaping Use   Vaping Use: Never used  Substance Use Topics   Alcohol use: Yes    Comment: 7 beers daily   Drug use: Yes    Types: Marijuana    Comment: everyday     Colonoscopy:  PAP:  Bone density:  Lipid panel:  Allergies  Allergen  Reactions   Keppra [Levetiracetam] Anxiety and Other (See Comments)    Severe mood changes   Chantix [Varenicline] Hives   Wasp Venom Swelling   Penicillins Rash    Has patient had a PCN reaction causing immediate rash, facial/tongue/throat swelling, SOB or lightheadedness with hypotension:YES Has patient had a PCN reaction causing severe rash involving mucus membranes or skin necrosis: NO Has patient had a PCN reaction that required hospitalization NO Has patient had a PCN reaction occurring within the last 10 years: NO If all of the above answers are "NO", then may proceed with Cephalosporin use.    Current Outpatient Medications  Medication Sig Dispense Refill   acetaminophen (TYLENOL) 500 MG tablet Take 1,000 mg by mouth every 6 (six) hours as needed.     Cholecalciferol (VITAMIN D-1000 MAX ST) 25 MCG (1000 UT) tablet Take by mouth.     cholecalciferol (VITAMIN D3) 25 MCG (1000 UNIT) tablet Take 1,000 Units by mouth daily.     Fluticasone-Umeclidin-Vilant (TRELEGY ELLIPTA) 100-62.5-25 MCG/ACT AEPB Inhale 1 puff into the lungs every morning.     Fluticasone-Umeclidin-Vilant (TRELEGY ELLIPTA) 100-62.5-25 MCG/ACT AEPB Inhale into the lungs.     gabapentin (NEURONTIN) 300 MG capsule Take 1 capsule (300 mg) by mouth once a day for 1 week, then 1 capsule (300 mg) by mouth two times a day for 1 week, then 1 capsule (300 mg) by mouth three times a day. (Patient taking differently: Take 300-600 mg by mouth 3 (three) times daily. 2 tab in am, 1 tab at lunch and 1 tab in the evening) 120 capsule 5   lamoTRIgine (LAMICTAL) 100 MG tablet Take 1 tablet (100 mg total) by mouth 2 (two) times daily. 60 tablet 5   lidocaine-prilocaine (EMLA) cream Apply to affected area once 30 g 3   Multiple Vitamin (MULTI-VITAMIN) tablet Take 1 tablet by mouth daily.     nicotine (NICOTROL) 10 MG inhaler Inhale 1 continuous puffing into the lungs as needed for smoking cessation.     nystatin (MYCOSTATIN) 100000 UNIT/ML  suspension Take 5 mLs by mouth 4 (four) times daily.     ondansetron (ZOFRAN) 8 MG tablet Take 1 tablet (8 mg total) by mouth every 8 (eight) hours as needed for nausea or vomiting. Start on the third day after chemotherapy. 60 tablet 1   prochlorperazine (COMPAZINE) 10 MG tablet Take 1 tablet (10 mg total) by mouth every 6 (six) hours as needed for nausea or vomiting. 60 tablet 1   No current facility-administered medications for this visit.   Facility-Administered Medications Ordered in Other Visits  Medication Dose Route Frequency Provider Last Rate Last Admin   CARBOplatin (PARAPLATIN) 270 mg in sodium chloride 0.9 % 100 mL chemo infusion  270 mg Intravenous Once Lloyd Huger, MD       heparin lock flush 100 unit/mL  500 Units Intracatheter Once PRN Lloyd Huger,  MD       PACLitaxel (TAXOL) 90 mg in sodium chloride 0.9 % 250 mL chemo infusion (</= 80mg /m2)  45 mg/m2 (Treatment Plan Recorded) Intravenous Once Lloyd Huger, MD        OBJECTIVE: Vitals:   11/20/22 0840  BP: (!) 142/76  Pulse: (!) 53  Resp: 16  Temp: (!) 97 F (36.1 C)  SpO2: 99%     Body mass index is 23.87 kg/m.    ECOG FS:0 - Asymptomatic  General: Well-developed, well-nourished, no acute distress. Eyes: Pink conjunctiva, anicteric sclera. HEENT: Normocephalic, moist mucous membranes. Lungs: No audible wheezing or coughing. Heart: Regular rate and rhythm. Abdomen: Soft, nontender, no obvious distention. Musculoskeletal: No edema, cyanosis, or clubbing. Neuro: Alert, answering all questions appropriately. Cranial nerves grossly intact. Skin: No rashes or petechiae noted. Psych: Normal affect.  LAB RESULTS:  Lab Results  Component Value Date   NA 135 11/20/2022   K 3.7 11/20/2022   CL 101 11/20/2022   CO2 25 11/20/2022   GLUCOSE 122 (H) 11/20/2022   BUN 6 (L) 11/20/2022   CREATININE 0.67 11/20/2022   CALCIUM 8.6 (L) 11/20/2022   PROT 7.6 11/20/2022   ALBUMIN 3.2 (L) 11/20/2022    AST 20 11/20/2022   ALT 12 11/20/2022   ALKPHOS 84 11/20/2022   BILITOT 0.3 11/20/2022   GFRNONAA >60 11/20/2022   GFRAA >60 01/18/2020    Lab Results  Component Value Date   WBC 5.0 11/20/2022   NEUTROABS 2.9 11/20/2022   HGB 13.8 11/20/2022   HCT 40.6 11/20/2022   MCV 94.6 11/20/2022   PLT 286 11/20/2022     STUDIES: IR IMAGING GUIDED PORT INSERTION  Result Date: 11/18/2022 INDICATION: 62 year old male referred for port catheter EXAM: IMAGE GUIDED PORT CATHETER MEDICATIONS: None ANESTHESIA/SEDATION: Moderate (conscious) sedation was employed during this procedure. A total of Versed 2.0 mg and Fentanyl 100 mcg was administered intravenously. Moderate Sedation Time: 22 minutes. The patient's level of consciousness and vital signs were monitored continuously by radiology nursing throughout the procedure under my direct supervision. FLUOROSCOPY TIME:  Fluoroscopy Time:   (1 mGy). COMPLICATIONS: None PROCEDURE: Informed written consent was obtained from the patient after a thorough discussion of the procedural risks, benefits and alternatives. All questions were addressed. Maximal Sterile Barrier Technique was utilized including caps, mask, sterile gowns, sterile gloves, sterile drape, hand hygiene and skin antiseptic. A timeout was performed prior to the initiation of the procedure. Ultrasound survey was performed with images stored and sent to PACs. Right IJ vein documented to be patent. The right neck and chest was prepped with chlorhexidine, and draped in the usual sterile fashion using maximum barrier technique (cap and mask, sterile gown, sterile gloves, large sterile sheet, hand hygiene and cutaneous antiseptic). Local anesthesia was attained by infiltration with 1% lidocaine without epinephrine. Ultrasound demonstrated patency of the right internal jugular vein, and this was documented with an image. Under real-time ultrasound guidance, this vein was accessed with a 21 gauge micropuncture  needle and image documentation was performed. A small dermatotomy was made at the access site with an 11 scalpel. A 0.018" wire was advanced into the SVC and used to estimate the length of the internal catheter. The access needle exchanged for a 60F micropuncture vascular sheath. The 0.018" wire was then removed and a 0.035" wire advanced into the IVC. An appropriate location for the subcutaneous reservoir was selected below the clavicle and an incision was made through the skin and underlying soft tissues. The  subcutaneous tissues were then dissected using a combination of blunt and sharp surgical technique and a pocket was formed. A single lumen power injectable portacatheter was then tunneled through the subcutaneous tissues from the pocket to the dermatotomy and the port reservoir placed within the subcutaneous pocket. The venous access site was then serially dilated and a peel away vascular sheath placed over the wire. The wire was removed and the port catheter advanced into position under fluoroscopic guidance. The catheter tip is positioned in the cavoatrial junction. This was documented with a spot image. The portacatheter was then tested and found to flush and aspirate well. The port was flushed with saline followed by 100 units/mL heparinized saline. The pocket was then closed in two layers using first subdermal inverted interrupted absorbable sutures followed by a running subcuticular suture. The epidermis was then sealed with Dermabond. Steri-Strips applied. The dermatotomy at the venous access site was also seal with Dermabond and Steri-Strips. Patient tolerated the procedure well and remained hemodynamically stable throughout. No complications encountered and no significant blood loss encountered IMPRESSION: Status post right IJ port catheter. Signed, Dulcy Fanny. Nadene Rubins, RPVI [facility] Electronically Signed   By: Corrie Mckusick D.O.    On: 11/18/2022 14:42   CT CHEST WO CONTRAST  Result Date: 11/04/2022 CLINICAL DATA:  Bronchoscopy, lung mass.  * Tracking Code: BO * EXAM: CT CHEST WITHOUT CONTRAST TECHNIQUE: Multidetector CT imaging of the chest was performed following the standard protocol without IV contrast. RADIATION DOSE REDUCTION: This exam was performed according to the departmental dose-optimization program which includes automated exposure control, adjustment of the mA and/or kV according to patient size and/or use of iterative reconstruction technique. COMPARISON:  PET 09/11/2022 and CT chest 06/13/2022. FINDINGS: Cardiovascular: Atherosclerotic calcification of the aorta, aortic valve and coronary arteries. Heart is enlarged. No pericardial effusion. Mediastinum/Nodes: Mediastinal lymph nodes measure up to 10 mm in the AP window, previously 8 mm. Left hilar adenopathy is poorly assessed without IV contrast. No axillary adenopathy. Esophagus is grossly unremarkable. Lungs/Pleura: Smoking related respiratory bronchiolitis. Masslike consolidation in the left lower lobe is similar in size to 09/11/2022, measuring approximately 7.9 x 9.5 cm, with extension to the left hilum. Associated obstruction of the basal segmental bronchi in the left lower lobe with bronchial or debris. Small left pleural effusion. Upper Abdomen: Visualized portion of the liver is unremarkable. There may be noncalcified stones in the gallbladder. Adrenal glands, kidneys, spleen, pancreas, stomach and bowel are grossly unremarkable. Upper abdominal lymph nodes are not enlarged by CT size criteria. Musculoskeletal: Degenerative changes in the spine. Old rib fractures. Old T11 and T12 compression fractures. No worrisome lytic or sclerotic lesions. IMPRESSION: 1. Left lower lobe mass with involvement of the left hilum and ipsilateral mediastinal lymph nodes, compatible with primary bronchogenic carcinoma, similar in appearance to 09/11/2022. 2. Obstructed basal  segmental left lower lobe bronchi with postobstructive bronchial debris and peribronchovascular nodularity. 3. Small left pleural effusion. 4. Question noncalcified gallstones. 5. Aortic atherosclerosis (ICD10-I70.0). Coronary artery calcification. Electronically Signed   By: Lorin Picket M.D.   On: 11/04/2022 13:35   DG Chest Port 1 View  Result Date: 11/03/2022 CLINICAL DATA:  Status post bronchoscopy. EXAM: PORTABLE CHEST 1 VIEW COMPARISON:  Chest CT 11/03/2022 FINDINGS: Stable dense airspace or calcification in the left lower lobe. No postprocedural hematoma or pneumothorax. IMPRESSION: 1. Stable dense airspace opacification in the left lower lobe. 2. No postprocedural hematoma or pneumothorax. Electronically Signed  By: Marijo Sanes M.D.   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  PET scan results from September 11, 2022 reviewed independently confirming stage of disease.  MRI of the brain on September 22, 2022 did not reveal any metastatic disease. He will benefit from concurrent chemotherapy with weekly carboplatin and Taxol with 5-8 cycles along with daily XRT.  Once he completes his concurrent treatment, he will benefit from maintenance durvalumab every 2 weeks for an entire year.  Patient has had port placement.  Proceed with cycle 1 of carboplatinum and Taxol today.  Patient will initiate XRT on Monday.  Return to clinic in 1 week for further evaluation and consideration of cycle 2.    I spent a total of 30 minutes reviewing chart data, face-to-face evaluation with the patient, counseling and coordination of care as detailed above.   Patient expressed understanding and was in agreement with this plan. He also understands that He can call clinic at any time with any questions, concerns, or complaints.     Cancer Staging  Squamous cell carcinoma of lower lobe of left lung Staging form: Lung, AJCC 8th Edition - Clinical stage from 11/11/2022: Stage IIIA (cT4, cN1, cM0) - Signed by Lloyd Huger, MD on 11/11/2022 Stage prefix: Initial diagnosis   Lloyd Huger, MD   11/20/2022 10:38 AM

## 2022-11-21 ENCOUNTER — Ambulatory Visit: Payer: Medicare HMO

## 2022-11-21 ENCOUNTER — Telehealth: Payer: Self-pay

## 2022-11-21 NOTE — Telephone Encounter (Signed)
Telephone call to patient for follow up after receiving first infusion.   Patient wife states infusion went great.  States eating good and drinking plenty of fluids.   Denies any nausea or vomiting.  Encouraged patient to call for any concerns or questions.  

## 2022-11-22 ENCOUNTER — Telehealth: Payer: Self-pay | Admitting: Oncology

## 2022-11-22 NOTE — Telephone Encounter (Signed)
Patient's wife called and asked if patient can take Tylenol while on chemotherapy for pain on his side.  Advised patient to take Tylenol 650 mg every 6 hours as needed.  Wife also asked if he can remove the "patch" off his chest.  With further questioning it appears that wife was questioning about gauze dressing on his chest, wife is not able to provide any further details of the location, why the gauze dressing was placed.  Wife says she will call back on Monday to ask.  CC Dr. Donnella Sham team to follow-up.

## 2022-11-24 ENCOUNTER — Ambulatory Visit
Admission: RE | Admit: 2022-11-24 | Discharge: 2022-11-24 | Disposition: A | Discharge status: Home / Self Care | Payer: Medicare HMO | Source: Ambulatory Visit | Attending: Radiation Oncology | Admitting: Radiation Oncology

## 2022-11-24 ENCOUNTER — Other Ambulatory Visit: Payer: Self-pay | Admitting: *Deleted

## 2022-11-24 ENCOUNTER — Other Ambulatory Visit: Payer: Self-pay

## 2022-11-24 ENCOUNTER — Inpatient Hospital Stay: Payer: Medicare HMO

## 2022-11-24 DIAGNOSIS — Z510 Encounter for antineoplastic radiation therapy: Secondary | ICD-10-CM | POA: Diagnosis not present

## 2022-11-24 DIAGNOSIS — F17210 Nicotine dependence, cigarettes, uncomplicated: Secondary | ICD-10-CM | POA: Diagnosis not present

## 2022-11-24 DIAGNOSIS — C3432 Malignant neoplasm of lower lobe, left bronchus or lung: Secondary | ICD-10-CM | POA: Diagnosis not present

## 2022-11-24 LAB — RAD ONC ARIA SESSION SUMMARY
Course Elapsed Days: 0
Plan Fractions Treated to Date: 1
Plan Prescribed Dose Per Fraction: 2 Gy
Plan Total Fractions Prescribed: 35
Plan Total Prescribed Dose: 70 Gy
Reference Point Dosage Given to Date: 2 Gy
Reference Point Session Dosage Given: 2 Gy
Session Number: 1

## 2022-11-24 MED ORDER — TRAMADOL HCL 50 MG PO TABS: 50.0000 mg | ORAL_TABLET | Freq: Four times a day (QID) | ORAL | 0 refills | Status: DC | PRN

## 2022-11-25 ENCOUNTER — Other Ambulatory Visit: Payer: Self-pay

## 2022-11-25 ENCOUNTER — Ambulatory Visit
Admission: RE | Admit: 2022-11-25 | Discharge: 2022-11-25 | Disposition: A | Discharge status: Home / Self Care | Payer: Medicare HMO | Source: Ambulatory Visit | Attending: Radiation Oncology | Admitting: Radiation Oncology

## 2022-11-25 ENCOUNTER — Encounter: Payer: Self-pay | Admitting: Licensed Clinical Social Worker

## 2022-11-25 ENCOUNTER — Inpatient Hospital Stay: Payer: Medicare HMO

## 2022-11-25 DIAGNOSIS — Z510 Encounter for antineoplastic radiation therapy: Secondary | ICD-10-CM | POA: Diagnosis not present

## 2022-11-25 DIAGNOSIS — C3432 Malignant neoplasm of lower lobe, left bronchus or lung: Secondary | ICD-10-CM | POA: Diagnosis not present

## 2022-11-25 DIAGNOSIS — F17210 Nicotine dependence, cigarettes, uncomplicated: Secondary | ICD-10-CM | POA: Diagnosis not present

## 2022-11-25 LAB — RAD ONC ARIA SESSION SUMMARY
Course Elapsed Days: 1
Plan Fractions Treated to Date: 2
Plan Prescribed Dose Per Fraction: 2 Gy
Plan Total Fractions Prescribed: 35
Plan Total Prescribed Dose: 70 Gy
Reference Point Dosage Given to Date: 4 Gy
Reference Point Session Dosage Given: 2 Gy
Session Number: 2

## 2022-11-25 NOTE — Progress Notes (Signed)
[facility] Clinical Social Work  Clinical Social Work was referred by medical provider for assessment of psychosocial needs.  Clinical Social Worker contacted patient by phone  to offer support and assess for needs.  Patient stated he was unavailable to speak with CSW and requested for CSW to contact him at a later time.  FA   Joseph Art, LCSW  Clinical Social Worker [facility]

## 2022-11-26 ENCOUNTER — Ambulatory Visit
Admission: RE | Admit: 2022-11-26 | Discharge: 2022-11-26 | Disposition: A | Discharge status: Home / Self Care | Payer: Medicare HMO | Source: Ambulatory Visit | Attending: Radiation Oncology | Admitting: Radiation Oncology

## 2022-11-26 ENCOUNTER — Encounter: Payer: Self-pay | Admitting: *Deleted

## 2022-11-26 ENCOUNTER — Inpatient Hospital Stay: Payer: Medicare HMO

## 2022-11-26 ENCOUNTER — Other Ambulatory Visit: Payer: Self-pay

## 2022-11-26 DIAGNOSIS — Z510 Encounter for antineoplastic radiation therapy: Secondary | ICD-10-CM | POA: Diagnosis not present

## 2022-11-26 DIAGNOSIS — F17210 Nicotine dependence, cigarettes, uncomplicated: Secondary | ICD-10-CM | POA: Diagnosis not present

## 2022-11-26 DIAGNOSIS — C3432 Malignant neoplasm of lower lobe, left bronchus or lung: Secondary | ICD-10-CM | POA: Diagnosis not present

## 2022-11-26 LAB — RAD ONC ARIA SESSION SUMMARY
Course Elapsed Days: 2
Plan Fractions Treated to Date: 3
Plan Prescribed Dose Per Fraction: 2 Gy
Plan Total Fractions Prescribed: 35
Plan Total Prescribed Dose: 70 Gy
Reference Point Dosage Given to Date: 6 Gy
Reference Point Session Dosage Given: 2 Gy
Session Number: 3

## 2022-11-26 MED FILL — Dexamethasone Sodium Phosphate Inj 100 MG/10ML: INTRAMUSCULAR | Qty: 1 | Status: AC

## 2022-11-27 ENCOUNTER — Encounter: Payer: Self-pay | Admitting: *Deleted

## 2022-11-27 ENCOUNTER — Inpatient Hospital Stay: Payer: Medicare HMO

## 2022-11-27 ENCOUNTER — Ambulatory Visit
Admission: RE | Admit: 2022-11-27 | Discharge: 2022-11-27 | Disposition: A | Discharge status: Home / Self Care | Payer: Medicare HMO | Source: Ambulatory Visit | Attending: Radiation Oncology | Admitting: Radiation Oncology

## 2022-11-27 ENCOUNTER — Inpatient Hospital Stay (HOSPITAL_BASED_OUTPATIENT_CLINIC_OR_DEPARTMENT_OTHER): Payer: Medicare HMO | Admitting: Oncology

## 2022-11-27 ENCOUNTER — Encounter: Payer: Self-pay | Admitting: Oncology

## 2022-11-27 ENCOUNTER — Other Ambulatory Visit: Payer: Self-pay

## 2022-11-27 DIAGNOSIS — Z5111 Encounter for antineoplastic chemotherapy: Secondary | ICD-10-CM | POA: Diagnosis not present

## 2022-11-27 DIAGNOSIS — D649 Anemia, unspecified: Secondary | ICD-10-CM | POA: Diagnosis not present

## 2022-11-27 DIAGNOSIS — C3432 Malignant neoplasm of lower lobe, left bronchus or lung: Secondary | ICD-10-CM

## 2022-11-27 DIAGNOSIS — F17210 Nicotine dependence, cigarettes, uncomplicated: Secondary | ICD-10-CM | POA: Diagnosis not present

## 2022-11-27 DIAGNOSIS — Z510 Encounter for antineoplastic radiation therapy: Secondary | ICD-10-CM | POA: Diagnosis not present

## 2022-11-27 DIAGNOSIS — E871 Hypo-osmolality and hyponatremia: Secondary | ICD-10-CM | POA: Diagnosis not present

## 2022-11-27 DIAGNOSIS — Z79899 Other long term (current) drug therapy: Secondary | ICD-10-CM | POA: Diagnosis not present

## 2022-11-27 LAB — CMP (CANCER CENTER ONLY)
ALT: 11 U/L (ref 0–44)
AST: 19 U/L (ref 15–41)
Albumin: 3.2 g/dL — ABNORMAL LOW (ref 3.5–5.0)
Alkaline Phosphatase: 68 U/L (ref 38–126)
Anion gap: 7 (ref 5–15)
BUN: 8 mg/dL (ref 8–23)
CO2: 29 mmol/L (ref 22–32)
Calcium: 8.5 mg/dL — ABNORMAL LOW (ref 8.9–10.3)
Chloride: 96 mmol/L — ABNORMAL LOW (ref 98–111)
Creatinine: 0.62 mg/dL (ref 0.61–1.24)
GFR, Estimated: >60 mL/min (ref >60)
Glucose, Bld: 113 mg/dL — ABNORMAL HIGH (ref 70–99)
Potassium: 3.9 mmol/L (ref 3.5–5.1)
Sodium: 132 mmol/L — ABNORMAL LOW (ref 135–145)
Total Bilirubin: 0.4 mg/dL (ref 0.3–1.2)
Total Protein: 7.5 g/dL (ref 6.5–8.1)

## 2022-11-27 LAB — CBC WITH DIFFERENTIAL (CANCER CENTER ONLY)
Abs Immature Granulocytes: 0.03 10*3/uL (ref 0.00–0.07)
Basophils Absolute: 0 10*3/uL (ref 0.0–0.1)
Basophils Relative: 1 %
Eosinophils Absolute: 0.1 10*3/uL (ref 0.0–0.5)
Eosinophils Relative: 2 %
HCT: 36.3 % — ABNORMAL LOW (ref 39.0–52.0)
Hemoglobin: 12.8 g/dL — ABNORMAL LOW (ref 13.0–17.0)
Immature Granulocytes: 1 %
Lymphocytes Relative: 14 %
Lymphs Abs: 0.9 10*3/uL (ref 0.7–4.0)
MCH: 33.2 pg (ref 26.0–34.0)
MCHC: 35.3 g/dL (ref 30.0–36.0)
MCV: 94 fL (ref 80.0–100.0)
Monocytes Absolute: 0.3 10*3/uL (ref 0.1–1.0)
Monocytes Relative: 5 %
Neutro Abs: 4.7 10*3/uL (ref 1.7–7.7)
Neutrophils Relative %: 77 %
Platelet Count: 234 10*3/uL (ref 150–400)
RBC: 3.86 MIL/uL — ABNORMAL LOW (ref 4.22–5.81)
RDW: 13.5 % (ref 11.5–15.5)
WBC Count: 6.1 10*3/uL (ref 4.0–10.5)
nRBC: 0 % (ref 0.0–0.2)

## 2022-11-27 LAB — RAD ONC ARIA SESSION SUMMARY
Course Elapsed Days: 3
Plan Fractions Treated to Date: 4
Plan Prescribed Dose Per Fraction: 2 Gy
Plan Total Fractions Prescribed: 35
Plan Total Prescribed Dose: 70 Gy
Reference Point Dosage Given to Date: 8 Gy
Reference Point Session Dosage Given: 2 Gy
Session Number: 4

## 2022-11-27 MED ORDER — DIPHENHYDRAMINE HCL 50 MG/ML IJ SOLN
25.0000 mg | Freq: Once | INTRAMUSCULAR | Status: AC
  Administered 2022-11-27: 25 mg via INTRAVENOUS
  Filled 2022-11-27: qty 1

## 2022-11-27 MED ORDER — PALONOSETRON HCL INJECTION 0.25 MG/5ML
0.2500 mg | Freq: Once | INTRAVENOUS | Status: AC
  Administered 2022-11-27: 0.25 mg via INTRAVENOUS
  Filled 2022-11-27: qty 5

## 2022-11-27 MED ORDER — FAMOTIDINE IN NACL 20-0.9 MG/50ML-% IV SOLN
20.0000 mg | Freq: Once | INTRAVENOUS | Status: AC
  Administered 2022-11-27: 20 mg via INTRAVENOUS
  Filled 2022-11-27: qty 50

## 2022-11-27 MED ORDER — HEPARIN SOD (PORK) LOCK FLUSH 100 UNIT/ML IV SOLN
500.0000 [IU] | Freq: Once | INTRAVENOUS | Status: AC | PRN
  Administered 2022-11-27: 500 [IU]
  Filled 2022-11-27: qty 5

## 2022-11-27 MED ORDER — SODIUM CHLORIDE 0.9 % IV SOLN
Freq: Once | INTRAVENOUS | Status: AC
  Filled 2022-11-27: qty 250

## 2022-11-27 MED ORDER — SODIUM CHLORIDE 0.9 % IV SOLN
272.8000 mg | Freq: Once | INTRAVENOUS | Status: AC
  Administered 2022-11-27: 270 mg via INTRAVENOUS
  Filled 2022-11-27: qty 27

## 2022-11-27 MED ORDER — SODIUM CHLORIDE 0.9 % IV SOLN
45.0000 mg/m2 | Freq: Once | INTRAVENOUS | Status: AC
  Administered 2022-11-27: 90 mg via INTRAVENOUS
  Filled 2022-11-27: qty 15

## 2022-11-27 MED ORDER — SODIUM CHLORIDE 0.9 % IV SOLN
10.0000 mg | Freq: Once | INTRAVENOUS | Status: AC
  Administered 2022-11-27: 10 mg via INTRAVENOUS
  Filled 2022-11-27: qty 10

## 2022-11-27 NOTE — Patient Instructions (Signed)
[facility]  Discharge Instructions: Thank you for choosing [facility] to provide your oncology and hematology care.  If you have a lab appointment with the Cancer Center, please go directly to the Cancer Center and check in at the registration area.  Wear comfortable clothing and clothing appropriate for easy access to any Portacath or PICC line.   We strive to give you quality time with your provider. You may need to reschedule your appointment if you arrive late (15 or more minutes).  Arriving late affects you and other patients whose appointments are after yours.  Also, if you miss three or more appointments without notifying the office, you may be dismissed from the clinic at the provider's discretion.      For prescription refill requests, have your pharmacy contact our office and allow 72 hours for refills to be completed.    Today you received the following chemotherapy and/or immunotherapy agents TAXOL & CARBOPLATIN      To help prevent nausea and vomiting after your treatment, we encourage you to take your nausea medication as directed.  BELOW ARE SYMPTOMS THAT SHOULD BE REPORTED IMMEDIATELY: *FEVER GREATER THAN 100.4 F (38 C) OR HIGHER *CHILLS OR SWEATING *NAUSEA AND VOMITING THAT IS NOT CONTROLLED WITH YOUR NAUSEA MEDICATION *UNUSUAL SHORTNESS OF BREATH *UNUSUAL BRUISING OR BLEEDING *URINARY PROBLEMS (pain or burning when urinating, or frequent urination) *BOWEL PROBLEMS (unusual diarrhea, constipation, pain near the anus) TENDERNESS IN MOUTH AND THROAT WITH OR WITHOUT PRESENCE OF ULCERS (sore throat, sores in mouth, or a toothache) UNUSUAL RASH, SWELLING OR PAIN  UNUSUAL VAGINAL DISCHARGE OR ITCHING   Items with * indicate a potential emergency and should be followed up as soon as possible or go to the Emergency Department if any problems should occur.  Please show the CHEMOTHERAPY ALERT CARD or IMMUNOTHERAPY ALERT CARD at  check-in to the Emergency Department and triage nurse.  Should you have questions after your visit or need to cancel or reschedule your appointment, please contact [facility]  [phone] and follow the prompts.  Office hours are 8:00 a.m. to 4:30 p.m. Monday - Friday. Please note that voicemails left after 4:00 p.m. may not be returned until the following business day.  We are closed weekends and major holidays. You have access to a nurse at all times for urgent questions. Please call the main number to the clinic [phone] and follow the prompts.  For any non-urgent questions, you may also contact your provider using MyChart. We now offer e-Visits for anyone 7618 and older to request care online for non-urgent symptoms. For details visit [email or web address].   Also download the MyChart app! Go to the app store, search "MyChart", open the app, select , and log in with your MyChart username and password.   Paclitaxel Injection What is this medication? PACLITAXEL (PAK li TAX el) treats some types of cancer. It works by slowing down the growth of cancer cells. This medicine may be used for other purposes; ask your health care provider or pharmacist if you have questions. COMMON BRAND NAME(S): Onxol, Taxol What should I tell my care team before I take this medication? They need to know if you have any of these conditions: Heart disease Liver disease Low white blood cell levels An unusual or allergic reaction to paclitaxel, other medications, foods, dyes, or preservatives If you or your partner are pregnant or trying to get pregnant Breast-feeding How should I  use this medication? This medication is injected into a vein. It is given by your care team in a hospital or clinic setting. Talk to your care team about the use of this medication in children. While it may be given to children for selected conditions, precautions do apply. Overdosage: If  you think you have taken too much of this medicine contact a poison control center or emergency room at once. NOTE: This medicine is only for you. Do not share this medicine with others. What if I miss a dose? Keep appointments for follow-up doses. It is important not to miss your dose. Call your care team if you are unable to keep an appointment. What may interact with this medication? Do not take this medication with any of the following: Live virus vaccines Other medications may affect the way this medication works. Talk with your care team about all of the medications you take. They may suggest changes to your treatment plan to lower the risk of side effects and to make sure your medications work as intended. This list may not describe all possible interactions. Give your health care provider a list of all the medicines, herbs, non-prescription drugs, or dietary supplements you use. Also tell them if you smoke, drink alcohol, or use illegal drugs. Some items may interact with your medicine. What should I watch for while using this medication? Your condition will be monitored carefully while you are receiving this medication. You may need blood work while taking this medication. This medication may make you feel generally unwell. This is not uncommon as chemotherapy can affect healthy cells as well as cancer cells. Report any side effects. Continue your course of treatment even though you feel ill unless your care team tells you to stop. This medication can cause serious allergic reactions. To reduce the risk, your care team may give you other medications to take before receiving this one. Be sure to follow the directions from your care team. This medication may increase your risk of getting an infection. Call your care team for advice if you get a fever, chills, sore throat, or other symptoms of a cold or flu. Do not treat yourself. Try to avoid being around people who are sick. This medication may  increase your risk to bruise or bleed. Call your care team if you notice any unusual bleeding. Be careful brushing or flossing your teeth or using a toothpick because you may get an infection or bleed more easily. If you have any dental work done, tell your dentist you are receiving this medication. Talk to your care team if you may be pregnant. Serious birth defects can occur if you take this medication during pregnancy. Talk to your care team before breastfeeding. Changes to your treatment plan may be needed. What side effects may I notice from receiving this medication? Side effects that you should report to your care team as soon as possible: Allergic reactions--skin rash, itching, hives, swelling of the face, lips, tongue, or throat Heart rhythm changes--fast or irregular heartbeat, dizziness, feeling faint or lightheaded, chest pain, trouble breathing Increase in blood pressure Infection--fever, chills, cough, sore throat, wounds that don't heal, pain or trouble when passing urine, general feeling of discomfort or being unwell Low blood pressure--dizziness, feeling faint or lightheaded, blurry vision Low red blood cell level--unusual weakness or fatigue, dizziness, headache, trouble breathing Painful swelling, warmth, or redness of the skin, blisters or sores at the infusion site Pain, tingling, or numbness in the hands or feet Slow  heartbeat--dizziness, feeling faint or lightheaded, confusion, trouble breathing, unusual weakness or fatigue Unusual bruising or bleeding Side effects that usually do not require medical attention (report to your care team if they continue or are bothersome): Diarrhea Hair loss Joint pain Loss of appetite Muscle pain Nausea Vomiting This list may not describe all possible side effects. Call your doctor for medical advice about side effects. You may report side effects to FDA at 1-800-FDA-1088. Where should I keep my medication? This medication is given in  a hospital or clinic. It will not be stored at home. NOTE: This sheet is a summary. It may not cover all possible information. If you have questions about this medicine, talk to your doctor, pharmacist, or health care provider.  2023 Elsevier/Gold Standard (2021-12-19 00:00:00)  Carboplatin Injection What is this medication? CARBOPLATIN (KAR boe pla tin) treats some types of cancer. It works by slowing down the growth of cancer cells. This medicine may be used for other purposes; ask your health care provider or pharmacist if you have questions. COMMON BRAND NAME(S): Paraplatin What should I tell my care team before I take this medication? They need to know if you have any of these conditions: Blood disorders Hearing problems Kidney disease Recent or ongoing radiation therapy An unusual or allergic reaction to carboplatin, cisplatin, other medications, foods, dyes, or preservatives Pregnant or trying to get pregnant Breast-feeding How should I use this medication? This medication is injected into a vein. It is given by your care team in a hospital or clinic setting. Talk to your care team about the use of this medication in children. Special care may be needed. Overdosage: If you think you have taken too much of this medicine contact a poison control center or emergency room at once. NOTE: This medicine is only for you. Do not share this medicine with others. What if I miss a dose? Keep appointments for follow-up doses. It is important not to miss your dose. Call your care team if you are unable to keep an appointment. What may interact with this medication? Medications for seizures Some antibiotics, such as amikacin, gentamicin, neomycin, streptomycin, tobramycin Vaccines This list may not describe all possible interactions. Give your health care provider a list of all the medicines, herbs, non-prescription drugs, or dietary supplements you use. Also tell them if you smoke, drink  alcohol, or use illegal drugs. Some items may interact with your medicine. What should I watch for while using this medication? Your condition will be monitored carefully while you are receiving this medication. You may need blood work while taking this medication. This medication may make you feel generally unwell. This is not uncommon, as chemotherapy can affect healthy cells as well as cancer cells. Report any side effects. Continue your course of treatment even though you feel ill unless your care team tells you to stop. In some cases, you may be given additional medications to help with side effects. Follow all directions for their use. This medication may increase your risk of getting an infection. Call your care team for advice if you get a fever, chills, sore throat, or other symptoms of a cold or flu. Do not treat yourself. Try to avoid being around people who are sick. Avoid taking medications that contain aspirin, acetaminophen, ibuprofen, naproxen, or ketoprofen unless instructed by your care team. These medications may hide a fever. Be careful brushing or flossing your teeth or using a toothpick because you may get an infection or bleed more easily. If  you have any dental work done, tell your dentist you are receiving this medication. Talk to your care team if you wish to become pregnant or think you might be pregnant. This medication can cause serious birth defects. Talk to your care team about effective forms of contraception. Do not breast-feed while taking this medication. What side effects may I notice from receiving this medication? Side effects that you should report to your care team as soon as possible: Allergic reactions--skin rash, itching, hives, swelling of the face, lips, tongue, or throat Infection--fever, chills, cough, sore throat, wounds that don't heal, pain or trouble when passing urine, general feeling of discomfort or being unwell Low red blood cell level--unusual  weakness or fatigue, dizziness, headache, trouble breathing Pain, tingling, or numbness in the hands or feet, muscle weakness, change in vision, confusion or trouble speaking, loss of balance or coordination, trouble walking, seizures Unusual bruising or bleeding Side effects that usually do not require medical attention (report to your care team if they continue or are bothersome): Hair loss Nausea Unusual weakness or fatigue Vomiting This list may not describe all possible side effects. Call your doctor for medical advice about side effects. You may report side effects to FDA at 1-800-FDA-1088. Where should I keep my medication? This medication is given in a hospital or clinic. It will not be stored at home. NOTE: This sheet is a summary. It may not cover all possible information. If you have questions about this medicine, talk to your doctor, pharmacist, or health care provider.  2023 Elsevier/Gold Standard (2007-09-25 00:00:00)

## 2022-11-27 NOTE — Progress Notes (Signed)
Met with patient during follow up visit with Dr. Orlie Dakin to start cycle #2 of chemotherapy. Pt did not have any questions or needs during visit. Instructed to call with any questions. Pt verbalized understanding. Nothing further needed at this time.

## 2022-11-27 NOTE — Progress Notes (Signed)
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  INTERVAL HISTORY: Patient returns to clinic today for further evaluation and consideration of cycle 2 of weekly carboplatinum and Taxol.  He initiated XRT earlier this week.  He is tolerating his treatment well without significant side effects. He has no neurologic complaints.  He denies any recent fevers or illnesses.  He has a good appetite and denies weight loss.  He denies any chest pain, shortness of breath, cough, or hemoptysis.  He denies any nausea, vomiting, constipation, or diarrhea.  He has no urinary complaints.  Patient offers no specific complaints today.  REVIEW OF SYSTEMS:   Review of Systems  Constitutional: Negative.  Negative for fever, malaise/fatigue and weight loss.  Respiratory: Negative.  Negative for cough, hemoptysis and shortness of breath.   Cardiovascular: Negative.  Negative for chest pain and leg swelling.  Gastrointestinal: Negative.  Negative for abdominal pain.  Genitourinary: Negative.  Negative for dysuria.  Musculoskeletal: Negative.  Negative for back pain.  Skin: Negative.  Negative for rash.  Neurological: Negative.  Negative for dizziness, focal weakness, weakness and headaches.  Psychiatric/Behavioral: Negative.  The patient is not nervous/anxious.     As per HPI. Otherwise, a complete review of systems is negative.  PAST MEDICAL HISTORY: Past Medical History:  Diagnosis Date   Acute  encephalopathy    Acute hypoxic respiratory failure    Alcohol abuse    Allergy    Seasonal   Anemia    Ataxia    BPH associated with nocturia    Brain tumor (benign)    Carpal tunnel syndrome    bilateral   COPD (chronic obstructive pulmonary disease)    Depression    Diabetes mellitus without complication    Dyspnea    Headache    Hypertension    Mass of lower lobe of left lung    Meningioma    Moderate cognitive impairment    Neuropathy    Both legs   Seizures    last seizure was in January   SVT (supraventricular tachycardia) 2018   during intubation in ED    PAST SURGICAL HISTORY: Past Surgical History:  Procedure Laterality Date   CRANIOTOMY Left 11/06/2016   Procedure: LEFT Temporal CRANIOTOMY FOR TUMOR;  Surgeon: Coletta Memos, MD;  Location: [facility] OR;  Service: Neurosurgery;  Laterality: Left;  LEFT Temproal CRANIOTOMY FOR TUMOR    CRANIOTOMY Right 01/20/2020   Procedure: Right frontal craniotomy for tumor resection;  Surgeon: Coletta Memos, MD;  Location: [facility] OR;  Service: Neurosurgery;  Laterality: Right;   HERNIA REPAIR     UMBICIAL   IR IMAGING GUIDED PORT INSERTION  11/18/2022   VASECTOMY     VIDEO BRONCHOSCOPY WITH ENDOBRONCHIAL ULTRASOUND N/A 11/03/2022   Procedure: VIDEO BRONCHOSCOPY WITH ENDOBRONCHIAL ULTRASOUND;  Surgeon: Vida Rigger, MD;  Location: [facility] ORS;  Service: Thoracic;  Laterality: N/A;    FAMILY HISTORY: Family History  Problem Relation Age of Onset   Cancer Sister        Breast    ADVANCED DIRECTIVES (Y/N):  N  HEALTH MAINTENANCE: Social History   Tobacco Use   Smoking status: Every Day    Packs/day: 2.00    Years: 43.00    Additional pack years: 0.00    Total pack years: 86.00    Types: Cigarettes   Smokeless tobacco: Never  Vaping Use   Vaping Use: Never used  Substance Use Topics   Alcohol use: Yes    Comment: 7 beers daily   Drug use: Yes    Types: Marijuana    Comment: everyday     Colonoscopy:  PAP:  Bone  density:  Lipid panel:  Allergies  Allergen Reactions   Keppra [Levetiracetam] Anxiety and Other (See Comments)    Severe mood changes   Chantix [Varenicline] Hives   Wasp Venom Swelling   Penicillins Rash    Has patient had a PCN reaction causing immediate rash, facial/tongue/throat swelling, SOB or lightheadedness with hypotension:YES Has patient had a PCN reaction causing severe rash involving mucus membranes or skin necrosis: NO Has patient had a PCN reaction that required hospitalization NO Has patient had a PCN reaction occurring within the last 10 years: NO If all of the above answers are "NO", then may proceed with Cephalosporin use.    Current Outpatient Medications  Medication Sig Dispense Refill   acetaminophen (TYLENOL) 500 MG tablet Take 1,000 mg by mouth every 6 (six) hours as needed.     Cholecalciferol (VITAMIN D-1000 MAX ST) 25 MCG (1000 UT) tablet Take by mouth.     cholecalciferol (VITAMIN D3) 25 MCG (1000 UNIT) tablet Take 1,000 Units by mouth daily.     Fluticasone-Umeclidin-Vilant (TRELEGY ELLIPTA) 100-62.5-25 MCG/ACT AEPB Inhale 1 puff into the lungs every morning.     Fluticasone-Umeclidin-Vilant (TRELEGY ELLIPTA) 100-62.5-25 MCG/ACT AEPB Inhale into the lungs.     gabapentin (NEURONTIN) 300 MG capsule Take 1 capsule (300 mg) by mouth once a day for 1 week, then 1 capsule (300 mg) by mouth two times a day for 1 week, then 1 capsule (300 mg) by mouth three times a day. (Patient taking differently: Take 300-600 mg by mouth 3 (three) times daily. 2 tab in am, 1 tab at lunch and 1 tab in the evening) 120 capsule 5   lamoTRIgine (LAMICTAL) 100 MG tablet Take 1 tablet (100 mg total) by mouth 2 (two) times daily. 60 tablet 5   lidocaine-prilocaine (EMLA) cream Apply to affected area once 30 g 3   Multiple Vitamin (MULTI-VITAMIN) tablet Take 1 tablet by mouth daily.     nicotine (NICOTROL) 10 MG inhaler Inhale 1 continuous puffing into the lungs as needed for smoking  cessation.     nystatin (MYCOSTATIN) 100000 UNIT/ML suspension Take 5 mLs by mouth 4 (four) times daily.     ondansetron (ZOFRAN) 8 MG tablet Take 1 tablet (8 mg total) by mouth every 8 (eight) hours as needed for nausea or vomiting. Start on the third day after chemotherapy. 60 tablet 1   prochlorperazine (COMPAZINE) 10 MG tablet Take 1 tablet (10 mg total) by mouth every 6 (six) hours as needed for nausea or vomiting. 60 tablet 1   traMADol (ULTRAM) 50 MG tablet Take 1 tablet (50 mg total) by mouth every 6 (six) hours as needed. 30 tablet 0   No current facility-administered medications for this visit.    OBJECTIVE: Vitals:   11/27/22 0838  BP: 112/68  Pulse: 90  Temp: (!) 96.4 F (35.8 C)  SpO2: 97%     Body mass index is 23.6 kg/m.  ECOG FS:0 - Asymptomatic  General: Well-developed, well-nourished, no acute distress. Eyes: Pink conjunctiva, anicteric sclera. HEENT: Normocephalic, moist mucous membranes. Lungs: No audible wheezing or coughing. Heart: Regular rate and rhythm. Abdomen: Soft, nontender, no obvious distention. Musculoskeletal: No edema, cyanosis, or clubbing. Neuro: Alert, answering all questions appropriately. Cranial nerves grossly intact. Skin: No rashes or petechiae noted. Psych: Normal affect.  LAB RESULTS:  Lab Results  Component Value Date   NA 132 (L) 11/27/2022   K 3.9 11/27/2022   CL 96 (L) 11/27/2022   CO2 29 11/27/2022   GLUCOSE 113 (H) 11/27/2022   BUN 8 11/27/2022   CREATININE 0.62 11/27/2022   CALCIUM 8.5 (L) 11/27/2022   PROT 7.5 11/27/2022   ALBUMIN 3.2 (L) 11/27/2022   AST 19 11/27/2022   ALT 11 11/27/2022   ALKPHOS 68 11/27/2022   BILITOT 0.4 11/27/2022   GFRNONAA >60 11/27/2022   GFRAA >60 01/18/2020    Lab Results  Component Value Date   WBC 6.1 11/27/2022   NEUTROABS 4.7 11/27/2022   HGB 12.8 (L) 11/27/2022   HCT 36.3 (L) 11/27/2022   MCV 94.0 11/27/2022   PLT 234 11/27/2022     STUDIES: IR IMAGING GUIDED PORT  INSERTION  Result Date: 11/18/2022 INDICATION: 62 year old male referred for port catheter EXAM: IMAGE GUIDED PORT CATHETER MEDICATIONS: None ANESTHESIA/SEDATION: Moderate (conscious) sedation was employed during this procedure. A total of Versed 2.0 mg and Fentanyl 100 mcg was administered intravenously. Moderate Sedation Time: 22 minutes. The patient's level of consciousness and vital signs were monitored continuously by radiology nursing throughout the procedure under my direct supervision. FLUOROSCOPY TIME:  Fluoroscopy Time:   (1 mGy). COMPLICATIONS: None PROCEDURE: Informed written consent was obtained from the patient after a thorough discussion of the procedural risks, benefits and alternatives. All questions were addressed. Maximal Sterile Barrier Technique was utilized including caps, mask, sterile gowns, sterile gloves, sterile drape, hand hygiene and skin antiseptic. A timeout was performed prior to the initiation of the procedure. Ultrasound survey was performed with images stored and sent to PACs. Right IJ vein documented to be patent. The right neck and chest was prepped with chlorhexidine, and draped in the usual sterile fashion using maximum barrier technique (cap and mask, sterile gown, sterile gloves, large sterile sheet, hand hygiene and cutaneous antiseptic). Local anesthesia was attained by infiltration with 1% lidocaine without epinephrine. Ultrasound demonstrated patency of the right internal jugular vein, and this was documented with an image. Under real-time ultrasound guidance, this vein was accessed with a 21 gauge micropuncture needle and image documentation was performed. A small dermatotomy was made at the access site with an 11 scalpel. A 0.018" wire was advanced into the SVC and used to estimate the length of the internal catheter. The access needle exchanged for a 53F micropuncture vascular sheath. The 0.018" wire was then removed and a 0.035" wire advanced into the IVC. An  appropriate location for the subcutaneous reservoir was selected below the clavicle and an incision was made through the skin and underlying soft tissues. The subcutaneous tissues were then dissected using a combination of blunt and sharp surgical technique and a pocket was formed. A single lumen power injectable portacatheter was then tunneled through the subcutaneous tissues from the pocket to the dermatotomy and the port reservoir placed within the subcutaneous pocket. The venous access site was then serially dilated and a peel away vascular sheath placed over the wire. The wire was removed and the port catheter advanced into position under fluoroscopic  guidance. The catheter tip is positioned in the cavoatrial junction. This was documented with a spot image. The portacatheter was then tested and found to flush and aspirate well. The port was flushed with saline followed by 100 units/mL heparinized saline. The pocket was then closed in two layers using first subdermal inverted interrupted absorbable sutures followed by a running subcuticular suture. The epidermis was then sealed with Dermabond. Steri-Strips applied. The dermatotomy at the venous access site was also seal with Dermabond and Steri-Strips. Patient tolerated the procedure well and remained hemodynamically stable throughout. No complications encountered and no significant blood loss encountered IMPRESSION: Status post right IJ port catheter. Signed, Yvone Neu. Miachel Roux, RPVI [facility] Electronically Signed   By: Gilmer Mor D.O.   On: 11/18/2022 14:42   CT CHEST WO CONTRAST  Result Date: 11/04/2022 CLINICAL DATA:  Bronchoscopy, lung mass.  * Tracking Code: BO * EXAM: CT CHEST WITHOUT CONTRAST TECHNIQUE: Multidetector CT imaging of the chest was performed following the standard protocol without IV contrast. RADIATION DOSE REDUCTION: This exam was performed according to the  departmental dose-optimization program which includes automated exposure control, adjustment of the mA and/or kV according to patient size and/or use of iterative reconstruction technique. COMPARISON:  PET 09/11/2022 and CT chest 06/13/2022. FINDINGS: Cardiovascular: Atherosclerotic calcification of the aorta, aortic valve and coronary arteries. Heart is enlarged. No pericardial effusion. Mediastinum/Nodes: Mediastinal lymph nodes measure up to 10 mm in the AP window, previously 8 mm. Left hilar adenopathy is poorly assessed without IV contrast. No axillary adenopathy. Esophagus is grossly unremarkable. Lungs/Pleura: Smoking related respiratory bronchiolitis. Masslike consolidation in the left lower lobe is similar in size to 09/11/2022, measuring approximately 7.9 x 9.5 cm, with extension to the left hilum. Associated obstruction of the basal segmental bronchi in the left lower lobe with bronchial or debris. Small left pleural effusion. Upper Abdomen: Visualized portion of the liver is unremarkable. There may be noncalcified stones in the gallbladder. Adrenal glands, kidneys, spleen, pancreas, stomach and bowel are grossly unremarkable. Upper abdominal lymph nodes are not enlarged by CT size criteria. Musculoskeletal: Degenerative changes in the spine. Old rib fractures. Old T11 and T12 compression fractures. No worrisome lytic or sclerotic lesions. IMPRESSION: 1. Left lower lobe mass with involvement of the left hilum and ipsilateral mediastinal lymph nodes, compatible with primary bronchogenic carcinoma, similar in appearance to 09/11/2022. 2. Obstructed basal segmental left lower lobe bronchi with postobstructive bronchial debris and peribronchovascular nodularity. 3. Small left pleural effusion. 4. Question noncalcified gallstones. 5. Aortic atherosclerosis (ICD10-I70.0). Coronary artery calcification. Electronically Signed   By: Leanna Battles M.D.   On: 11/04/2022 13:35   DG Chest Port 1 View  Result  Date: 11/03/2022 CLINICAL DATA:  Status post bronchoscopy. EXAM: PORTABLE CHEST 1 VIEW COMPARISON:  Chest CT 11/03/2022 FINDINGS: Stable dense airspace or calcification in the left lower lobe. No postprocedural hematoma or pneumothorax. IMPRESSION: 1. Stable dense airspace opacification in the left lower lobe. 2. No postprocedural hematoma or pneumothorax. Electronically Signed   By: Rudie Meyer M.D.   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  PET scan results from September 11, 2022 reviewed independently confirming stage of disease.  MRI of the brain on September 22, 2022 did not reveal any metastatic disease.  He will benefit from concurrent chemotherapy with weekly carboplatin and Taxol with 5-8 cycles along with daily XRT.  Once he completes his concurrent treatment, he will benefit from maintenance durvalumab every 2 weeks for an entire year.  Patient has had port placement.  Proceed with cycle 2 of carboplatin and Taxol today.  Continue daily XRT.  Return to clinic in 1 week for further evaluation and consideration of cycle 3.  Hyponatremia: Mild, monitor.  Patient sodium is 132. Anemia: Mild, monitor.    Patient expressed understanding and was in agreement with this plan. He also understands that He can call clinic at any time with any questions, concerns, or complaints.    Cancer Staging  Squamous cell carcinoma of lower lobe of left lung Staging form: Lung, AJCC 8th Edition - Clinical stage from 11/11/2022: Stage IIIA (cT4, cN1, cM0) - Signed by Jeralyn Ruths, MD on 11/11/2022 Stage prefix: Initial diagnosis   Jeralyn Ruths, MD   11/27/2022 9:01 AM

## 2022-11-28 ENCOUNTER — Ambulatory Visit
Admission: RE | Admit: 2022-11-28 | Discharge: 2022-11-28 | Disposition: A | Discharge status: Home / Self Care | Payer: Medicare HMO | Source: Ambulatory Visit | Attending: Radiation Oncology | Admitting: Radiation Oncology

## 2022-11-28 ENCOUNTER — Other Ambulatory Visit: Payer: Self-pay

## 2022-11-28 ENCOUNTER — Encounter: Payer: Self-pay | Admitting: *Deleted

## 2022-11-28 ENCOUNTER — Inpatient Hospital Stay: Payer: Medicare HMO

## 2022-11-28 DIAGNOSIS — F17210 Nicotine dependence, cigarettes, uncomplicated: Secondary | ICD-10-CM | POA: Diagnosis not present

## 2022-11-28 DIAGNOSIS — Z510 Encounter for antineoplastic radiation therapy: Secondary | ICD-10-CM | POA: Diagnosis not present

## 2022-11-28 DIAGNOSIS — C3432 Malignant neoplasm of lower lobe, left bronchus or lung: Secondary | ICD-10-CM | POA: Diagnosis not present

## 2022-11-28 LAB — RAD ONC ARIA SESSION SUMMARY
Course Elapsed Days: 4
Plan Fractions Treated to Date: 5
Plan Prescribed Dose Per Fraction: 2 Gy
Plan Total Fractions Prescribed: 35
Plan Total Prescribed Dose: 70 Gy
Reference Point Dosage Given to Date: 10 Gy
Reference Point Session Dosage Given: 2 Gy
Session Number: 5

## 2022-12-01 ENCOUNTER — Ambulatory Visit
Admission: RE | Admit: 2022-12-01 | Discharge: 2022-12-01 | Disposition: A | Discharge status: Home / Self Care | Payer: Medicare HMO | Source: Ambulatory Visit | Attending: Radiation Oncology | Admitting: Radiation Oncology

## 2022-12-01 ENCOUNTER — Other Ambulatory Visit: Payer: Self-pay

## 2022-12-01 ENCOUNTER — Inpatient Hospital Stay: Payer: Medicare HMO

## 2022-12-01 ENCOUNTER — Encounter: Payer: Self-pay | Admitting: *Deleted

## 2022-12-01 DIAGNOSIS — F17210 Nicotine dependence, cigarettes, uncomplicated: Secondary | ICD-10-CM | POA: Diagnosis not present

## 2022-12-01 DIAGNOSIS — C3432 Malignant neoplasm of lower lobe, left bronchus or lung: Secondary | ICD-10-CM | POA: Diagnosis not present

## 2022-12-01 DIAGNOSIS — J449 Chronic obstructive pulmonary disease, unspecified: Secondary | ICD-10-CM | POA: Diagnosis not present

## 2022-12-01 DIAGNOSIS — Z510 Encounter for antineoplastic radiation therapy: Secondary | ICD-10-CM | POA: Diagnosis not present

## 2022-12-01 LAB — RAD ONC ARIA SESSION SUMMARY
Course Elapsed Days: 7
Plan Fractions Treated to Date: 6
Plan Prescribed Dose Per Fraction: 2 Gy
Plan Total Fractions Prescribed: 35
Plan Total Prescribed Dose: 70 Gy
Reference Point Dosage Given to Date: 12 Gy
Reference Point Session Dosage Given: 2 Gy
Session Number: 6

## 2022-12-02 ENCOUNTER — Other Ambulatory Visit: Payer: Self-pay

## 2022-12-02 ENCOUNTER — Inpatient Hospital Stay: Payer: Medicare HMO

## 2022-12-02 ENCOUNTER — Ambulatory Visit
Admission: RE | Admit: 2022-12-02 | Discharge: 2022-12-02 | Disposition: A | Discharge status: Home / Self Care | Payer: Medicare HMO | Source: Ambulatory Visit | Attending: Radiation Oncology | Admitting: Radiation Oncology

## 2022-12-02 DIAGNOSIS — Z510 Encounter for antineoplastic radiation therapy: Secondary | ICD-10-CM | POA: Diagnosis not present

## 2022-12-02 DIAGNOSIS — C3432 Malignant neoplasm of lower lobe, left bronchus or lung: Secondary | ICD-10-CM | POA: Diagnosis not present

## 2022-12-02 DIAGNOSIS — F17210 Nicotine dependence, cigarettes, uncomplicated: Secondary | ICD-10-CM | POA: Diagnosis not present

## 2022-12-02 LAB — RAD ONC ARIA SESSION SUMMARY
Course Elapsed Days: 8
Plan Fractions Treated to Date: 7
Plan Prescribed Dose Per Fraction: 2 Gy
Plan Total Fractions Prescribed: 35
Plan Total Prescribed Dose: 70 Gy
Reference Point Dosage Given to Date: 14 Gy
Reference Point Session Dosage Given: 2 Gy
Session Number: 7

## 2022-12-02 NOTE — Progress Notes (Signed)
Cardiology Office Note    Date:  12/03/2022   ID:  Frederick Drake, DOB Jul 31, 1961, MRN [id number]  PCP:  Mick Sell, MD  Cardiologist:  Lorine Bears, MD  Electrophysiologist:  None   Chief Complaint: Hospital follow-up  History of Present Illness:   Frederick Drake is a 62 y.o. 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  He underwent lung cancer screening CT in 01/2021 with concerns for possible obstructing mass as well as bronchiectasis. He was seen by pulmonology in 04/2021 and treated with doxycycline with follow-up chest x-ray showing some resolution. Following this, he was lost to follow-up. He followed up with his PCP in 05/2022 with repeat chest CT showing progressive infiltrative neoplasm in the left lower lobe of the lung. Subsequent PET scan showed enlargement with intense hypermetabolic activity. The mass extended into the left infrahilar lung and obstructing the bronchus. He has been evaluated by pulmonology with plans to undergo video-assisted bronchoscopy.  However, upon arriving to preop he was incidentally noted to be in rate controlled atrial flutter with variable AV block.  He was asymptomatic.  It was also noted the patient's PCP had previously considered stress testing due to coronary artery calcification noted on CT imaging.  In this setting, we evaluated the patient for preoperative cardiac risk stratification at which time he was felt to be acceptable risk for bronchoscopy.  The patient was  without symptoms of angina or cardiac decompensation.  He continues to smoke 1.5 to 2 packs/day.  With regards to [facility], his CHA2DS2-VASc was 3 with recommendation for this to be addressed with him in the outpatient setting following bronchoscopy/biopsy.  He comes in today and is doing well from a cardiac perspective.  No symptoms of angina or cardiac decompensation.  No dyspnea, palpitations, dizziness, presyncope, or syncope.  No lower extremity swelling.  No orthopnea or early satiety.  He has decreased tobacco use from 2 packs/day to 1 pack/day.  He has had one mechanical fall in the past 12 months, slipping in wet grass with his walker.  He denies any symptoms of hematochezia or melena.  He remains in atrial flutter with variable AV block at this time.   Labs independently reviewed: 11/2022 - Hgb 12.8, PLT 234, potassium 3.9, BUN 8, serum creatinine 0.62, albumin 3.2, AST/ALT normal 05/2022 - TC 177, TG 76, HDL 61, LDL 101, TSH normal  Past Medical History:  Diagnosis Date   Acute encephalopathy    Acute hypoxic respiratory failure    Alcohol abuse    Allergy    Seasonal   Anemia    Ataxia    BPH associated with nocturia    Brain tumor (benign)    Carpal tunnel syndrome    bilateral   COPD (chronic obstructive pulmonary disease)    Depression    Diabetes mellitus without complication    Dyspnea    Headache    Hypertension    Mass of lower lobe of left lung    Meningioma    Moderate cognitive impairment    Neuropathy    Both legs   Seizures    last  seizure was in January   SVT (supraventricular tachycardia) 2018   during intubation in ED    Past Surgical History:  Procedure Laterality Date   CRANIOTOMY Left 11/06/2016   Procedure: LEFT Temporal CRANIOTOMY FOR TUMOR;  Surgeon: Coletta Memos, MD;  Location: [facility] OR;  Service: Neurosurgery;  Laterality: Left;  LEFT Temproal CRANIOTOMY FOR TUMOR    CRANIOTOMY Right 01/20/2020   Procedure: Right frontal craniotomy for tumor  resection;  Surgeon: Coletta Memos, MD;  Location: [facility] OR;  Service: Neurosurgery;  Laterality: Right;   HERNIA REPAIR     UMBICIAL   IR IMAGING GUIDED PORT INSERTION  11/18/2022   VASECTOMY     VIDEO BRONCHOSCOPY WITH ENDOBRONCHIAL ULTRASOUND N/A 11/03/2022   Procedure: VIDEO BRONCHOSCOPY WITH ENDOBRONCHIAL ULTRASOUND;  Surgeon: Vida Rigger, MD;  Location: [facility] ORS;  Service: Thoracic;  Laterality: N/A;    Current Medications: Current Meds  Medication Sig   acetaminophen (TYLENOL) 500 MG tablet Take 1,000 mg by mouth every 6 (six) hours as needed.   apixaban (ELIQUIS) 5 MG TABS tablet Take 1 tablet (5 mg total) by mouth 2 (two) times daily.   cholecalciferol (VITAMIN D3) 25 MCG (1000 UNIT) tablet Take 1,000 Units by mouth daily.   Fluticasone-Umeclidin-Vilant (TRELEGY ELLIPTA) 100-62.5-25 MCG/ACT AEPB Inhale 1 puff into the lungs every morning.   gabapentin (NEURONTIN) 400 MG capsule Take 400 mg by mouth 3 (three) times daily.   lamoTRIgine (LAMICTAL) 100 MG tablet Take 1 tablet (100 mg total) by mouth 2 (two) times daily.   lidocaine-prilocaine (EMLA) cream Apply to affected area once   metoprolol succinate (TOPROL XL) 25 MG 24 hr tablet Take 1 tablet (25 mg total) by mouth daily.   Multiple Vitamin (MULTI-VITAMIN) tablet Take 1 tablet by mouth daily.   nicotine (NICOTROL) 10 MG inhaler Inhale 1 continuous puffing into the lungs as needed for smoking cessation.   ondansetron (ZOFRAN) 8 MG tablet Take 1 tablet (8 mg total) by mouth every 8 (eight) hours as needed for nausea or vomiting. Start on the third day after chemotherapy.   predniSONE (DELTASONE) 10 MG tablet Take 10 mg by mouth daily.   prochlorperazine (COMPAZINE) 10 MG tablet Take 1 tablet (10 mg total) by mouth every 6 (six) hours as needed for nausea or vomiting.   sulfamethoxazole-trimethoprim (BACTRIM) 400-80 MG tablet Take 1 tablet by mouth 3 (three) times a week.   traMADol (ULTRAM) 50 MG tablet Take 1 tablet (50 mg total)  by mouth every 6 (six) hours as needed.    Allergies:   Keppra [levetiracetam], Chantix [varenicline], Wasp venom, and Penicillins   Social History   Socioeconomic History   Marital status: Married    Spouse name: Not on file   Number of children: Not on file   Years of education: Not on file   Highest education level: Not on file  Occupational History   Not on file  Tobacco Use   Smoking status: Every Day    Packs/day: 2.00    Years: 43.00    Additional pack years: 0.00    Total pack years: 86.00    Types: Cigarettes   Smokeless tobacco: Never  Vaping Use   Vaping Use: Never used  Substance and Sexual Activity   Alcohol use: Yes    Comment: 7 beers daily   Drug use: Yes    Types: Marijuana    Comment: everyday   Sexual activity: Not on file  Other Topics Concern   Not on file  Social  History Narrative   Lives at home with wife. Patient does not have a good memory and has issues with transportation.    Social Determinants of Health   Financial Resource Strain: Not on file  Food Insecurity: Not on file  Transportation Needs: Unmet Transportation Needs (12/01/2022)   PRAPARE - Administrator, Civil Service (Medical): Yes    Lack of Transportation (Non-Medical): Yes  Physical Activity: Not on file  Stress: Not on file  Social Connections: Not on file     Family History:  The patient's family history includes Cancer in his sister.  ROS:   12-point review of systems is negative unless otherwise noted in the HPI.   EKGs/Labs/Other Studies Reviewed:    Studies reviewed were summarized above. The additional studies were reviewed today: None available for review  EKG:  EKG is ordered today.  The EKG ordered today demonstrates atrial flutter with variable AV block, 97 bpm, rare PVCs, nonspecific ST-T changes  Recent Labs: 11/27/2022: ALT 11; BUN 8; Creatinine 0.62; Hemoglobin 12.8; Platelet Count 234; Potassium 3.9; Sodium 132  Recent Lipid Panel     Component Value Date/Time   CHOL 185 03/05/2018 1204   TRIG 39 01/13/2020 0705   HDL 86 03/05/2018 1204   CHOLHDL 4.6 07/02/2017 1947   LDLCALC 89 03/05/2018 1204    PHYSICAL EXAM:    VS:  BP 118/68 (BP Location: Left Arm, Patient Position: Sitting, Cuff Size: Normal)   Pulse 97   Ht 6\' 1"  (1.854 m)   Wt 180 lb 6.4 oz (81.8 kg)   SpO2 98%   BMI 23.80 kg/m   BMI: Body mass index is 23.8 kg/m.  Physical Exam Vitals reviewed.  Constitutional:      Appearance: He is well-developed.  HENT:     Head: Normocephalic and atraumatic.  Eyes:     General:        Right eye: No discharge.        Left eye: No discharge.  Neck:     Vascular: No JVD.  Cardiovascular:     Rate and Rhythm: Normal rate. Rhythm irregularly irregular.     Heart sounds: Normal heart sounds, S1 normal and S2 normal. Heart sounds not distant. No midsystolic click and no opening snap. No murmur heard.    No friction rub.  Pulmonary:     Effort: Pulmonary effort is normal. No respiratory distress.     Breath sounds: Normal breath sounds. No decreased breath sounds, wheezing or rales.  Chest:     Chest wall: No tenderness.  Abdominal:     General: There is no distension.     Palpations: Abdomen is soft.     Tenderness: There is no abdominal tenderness.  Musculoskeletal:     Cervical back: Normal range of motion.     Right lower leg: No edema.     Left lower leg: No edema.  Skin:    General: Skin is warm and dry.     Nails: There is no clubbing.  Neurological:     Mental Status: He is alert and oriented to person, place, and time.  Psychiatric:        Speech: Speech normal.        Behavior: Behavior normal.        Thought Content: Thought content normal.        Judgment: Judgment normal.     Wt Readings from Last 3 Encounters:  12/03/22 180 lb 6.4 oz (81.8 kg)  11/27/22 174 lb (78.9 kg)  11/20/22 176 lb (79.8 kg)     ASSESSMENT & PLAN:   Atrial flutter: He remains in atrial flutter with  reasonably controlled ventricular response.  Start Toprol-XL 25 mg daily for added rate control.  CHADS2VASc at least 3 (HTN, DM, vascular disease).  Start apixaban 5 mg twice daily (he does not meet reduced dosing criteria).  Follow-up BMP, CBC, TSH, and magnesium in 1 week.  Obtain echo.  If he remains in atrial flutter in follow-up, and he has been adherent to twice daily dosing of apixaban (importance was discussed) without missing any doses, would look to pursue DCCV after he has been adequately anticoagulated for a minimum of 4 weeks.  Coronary artery calcification: No symptoms suggestive of angina.  LDL of 101 in 05/2022.  Target LDL less than 70.  Look to add atorvastatin in follow-up (wanted to avoid initiating 3 new medications at today's visit in an effort to not overwhelm the patient and and in case there are any off target effects).  Would benefit from ischemic evaluation in the near future for further risk stratification.  PSVT: Quiescent.  Toprol-XL added as outlined above.  HTN: Blood pressure is well controlled in the office today.  Toprol-XL is being added to today as outlined above.  Squamous of carcinoma of the left lower lobe of left lung: Follow up with oncology as directed.  Tobacco use: He has decreased use to 2 packs/day to 1 pack/day.  Complete cessation is encouraged.   Disposition: F/u with Dr. Kirke Corin or an APP after echo.   Medication Adjustments/Labs and Tests Ordered: Current medicines are reviewed at length with the patient today.  Concerns regarding medicines are outlined above. Medication changes, Labs and Tests ordered today are summarized above and listed in the Patient Instructions accessible in Encounters.   Signed, Eula Listen, PA-C 12/03/2022 1:01 PM     [facility] [place] [address] [place], [place] [address] [phone]

## 2022-12-03 ENCOUNTER — Other Ambulatory Visit: Payer: Self-pay | Admitting: *Deleted

## 2022-12-03 ENCOUNTER — Other Ambulatory Visit: Payer: Self-pay

## 2022-12-03 ENCOUNTER — Ambulatory Visit: Payer: Medicare HMO | Attending: Physician Assistant | Admitting: Physician Assistant

## 2022-12-03 ENCOUNTER — Ambulatory Visit
Admission: RE | Admit: 2022-12-03 | Discharge: 2022-12-03 | Disposition: A | Discharge status: Home / Self Care | Payer: Medicare HMO | Source: Ambulatory Visit | Attending: Radiation Oncology | Admitting: Radiation Oncology

## 2022-12-03 ENCOUNTER — Inpatient Hospital Stay: Payer: Medicare HMO

## 2022-12-03 ENCOUNTER — Encounter: Payer: Self-pay | Admitting: Physician Assistant

## 2022-12-03 VITALS — BP 118/68 | HR 97 | Ht 73.0 in | Wt 180.4 lb

## 2022-12-03 DIAGNOSIS — I10 Essential (primary) hypertension: Secondary | ICD-10-CM | POA: Diagnosis not present

## 2022-12-03 DIAGNOSIS — I471 Supraventricular tachycardia, unspecified: Secondary | ICD-10-CM

## 2022-12-03 DIAGNOSIS — Z720 Tobacco use: Secondary | ICD-10-CM

## 2022-12-03 DIAGNOSIS — F17210 Nicotine dependence, cigarettes, uncomplicated: Secondary | ICD-10-CM | POA: Diagnosis not present

## 2022-12-03 DIAGNOSIS — Z510 Encounter for antineoplastic radiation therapy: Secondary | ICD-10-CM | POA: Diagnosis not present

## 2022-12-03 DIAGNOSIS — I2584 Coronary atherosclerosis due to calcified coronary lesion: Secondary | ICD-10-CM | POA: Diagnosis not present

## 2022-12-03 DIAGNOSIS — I2510 Atherosclerotic heart disease of native coronary artery without angina pectoris: Secondary | ICD-10-CM | POA: Diagnosis not present

## 2022-12-03 DIAGNOSIS — I4892 Unspecified atrial flutter: Secondary | ICD-10-CM | POA: Diagnosis not present

## 2022-12-03 DIAGNOSIS — C3432 Malignant neoplasm of lower lobe, left bronchus or lung: Secondary | ICD-10-CM | POA: Diagnosis not present

## 2022-12-03 LAB — RAD ONC ARIA SESSION SUMMARY
Course Elapsed Days: 9
Plan Fractions Treated to Date: 8
Plan Prescribed Dose Per Fraction: 2 Gy
Plan Total Fractions Prescribed: 35
Plan Total Prescribed Dose: 70 Gy
Reference Point Dosage Given to Date: 16 Gy
Reference Point Session Dosage Given: 2 Gy
Session Number: 8

## 2022-12-03 MED ORDER — METOPROLOL SUCCINATE ER 25 MG PO TB24: 25.0000 mg | ORAL_TABLET | Freq: Every day | ORAL | 0 refills | Status: DC

## 2022-12-03 MED ORDER — APIXABAN 5 MG PO TABS: 5.0000 mg | ORAL_TABLET | Freq: Two times a day (BID) | ORAL | 1 refills | Status: DC

## 2022-12-03 MED ORDER — METOPROLOL SUCCINATE ER 25 MG PO TB24: 25.0000 mg | ORAL_TABLET | Freq: Every day | ORAL | 1 refills | Status: DC

## 2022-12-03 MED FILL — Dexamethasone Sodium Phosphate Inj 100 MG/10ML: INTRAMUSCULAR | Qty: 1 | Status: AC

## 2022-12-03 NOTE — Patient Instructions (Addendum)
Medication Instructions:   Your physician has recommended you make the following change in your medication:   START Eliquis 5 mg by mouth twice daily.  Samples Eliquis 5 mg 2 boxes: PRX:[id number]   Exp:01/2023  START Metoprolol XL 25 mg tablet by mouth daily.   *If you need a refill on your cardiac medications before your next appointment, please call your pharmacy*   Lab Work:  Your physician recommends that you return for lab work in: 1 WEEK  CBC/TSH/MAG/BMP  - Please go to the [facility]. You will check in at the front desk to the right as you walk into the atrium. Valet Parking is offered if needed. - No appointment needed. You may go any day between 7 am and 6 pm.   If you have labs (blood work) drawn today and your tests are completely normal, you will receive your results only by: MyChart Message (if you have MyChart) OR A paper copy in the mail If you have any lab test that is abnormal or we need to change your treatment, we will call you to review the results.   Testing/Procedures: Your physician has requested that you have an echocardiogram. Echocardiography is a painless test that uses sound waves to create images of your heart. It provides your doctor with information about the size and shape of your heart and how well your heart's chambers and valves are working. This procedure takes approximately one hour. There are no restrictions for this procedure. Please do NOT wear cologne, perfume, aftershave, or lotions (deodorant is allowed). Please arrive 15 minutes prior to your appointment time.    Follow-Up: At [facility], you and your health needs are our priority.  As part of our continuing mission to provide you with exceptional heart care, we have created designated Provider Care Teams.  These Care Teams include your primary Cardiologist (physician) and Advanced Practice Providers (APPs -  Physician Assistants and Nurse Practitioners) who all work  together to provide you with the care you need, when you need it.  We recommend signing up for the patient portal called "MyChart".  Sign up information is provided on this After Visit Summary.  MyChart is used to connect with patients for Virtual Visits (Telemedicine).  Patients are able to view lab/test results, encounter notes, upcoming appointments, etc.  Non-urgent messages can be sent to your provider as well.   To learn more about what you can do with MyChart, go to [email or web address].    Your next appointment:   Post echo follow up   Provider:   You may see Lorine Bears, MD or one of the following Advanced Practice Providers on your designated Care Team:   Nicolasa Ducking, NP Eula Listen, PA-C Cadence Fransico Michael, PA-C Charlsie Quest, NP

## 2022-12-04 ENCOUNTER — Inpatient Hospital Stay: Payer: Medicare HMO

## 2022-12-04 ENCOUNTER — Ambulatory Visit
Admission: RE | Admit: 2022-12-04 | Discharge: 2022-12-04 | Disposition: A | Discharge status: Home / Self Care | Payer: Medicare HMO | Source: Ambulatory Visit | Attending: Radiation Oncology | Admitting: Radiation Oncology

## 2022-12-04 ENCOUNTER — Inpatient Hospital Stay (HOSPITAL_BASED_OUTPATIENT_CLINIC_OR_DEPARTMENT_OTHER): Payer: Medicare HMO | Admitting: Oncology

## 2022-12-04 ENCOUNTER — Other Ambulatory Visit: Payer: Self-pay

## 2022-12-04 ENCOUNTER — Encounter: Payer: Self-pay | Admitting: Oncology

## 2022-12-04 ENCOUNTER — Encounter: Payer: Self-pay | Admitting: *Deleted

## 2022-12-04 DIAGNOSIS — C3432 Malignant neoplasm of lower lobe, left bronchus or lung: Secondary | ICD-10-CM

## 2022-12-04 DIAGNOSIS — Z510 Encounter for antineoplastic radiation therapy: Secondary | ICD-10-CM | POA: Diagnosis not present

## 2022-12-04 DIAGNOSIS — Z79899 Other long term (current) drug therapy: Secondary | ICD-10-CM | POA: Diagnosis not present

## 2022-12-04 DIAGNOSIS — E871 Hypo-osmolality and hyponatremia: Secondary | ICD-10-CM | POA: Diagnosis not present

## 2022-12-04 DIAGNOSIS — F17210 Nicotine dependence, cigarettes, uncomplicated: Secondary | ICD-10-CM | POA: Diagnosis not present

## 2022-12-04 DIAGNOSIS — Z5111 Encounter for antineoplastic chemotherapy: Secondary | ICD-10-CM | POA: Diagnosis not present

## 2022-12-04 DIAGNOSIS — D649 Anemia, unspecified: Secondary | ICD-10-CM | POA: Diagnosis not present

## 2022-12-04 LAB — CMP (CANCER CENTER ONLY)
ALT: 14 U/L (ref 0–44)
AST: 20 U/L (ref 15–41)
Albumin: 3.1 g/dL — ABNORMAL LOW (ref 3.5–5.0)
Alkaline Phosphatase: 70 U/L (ref 38–126)
Anion gap: 8 (ref 5–15)
BUN: 7 mg/dL — ABNORMAL LOW (ref 8–23)
CO2: 27 mmol/L (ref 22–32)
Calcium: 8.4 mg/dL — ABNORMAL LOW (ref 8.9–10.3)
Chloride: 97 mmol/L — ABNORMAL LOW (ref 98–111)
Creatinine: 0.51 mg/dL — ABNORMAL LOW (ref 0.61–1.24)
GFR, Estimated: >60 mL/min (ref >60)
Glucose, Bld: 119 mg/dL — ABNORMAL HIGH (ref 70–99)
Potassium: 3.5 mmol/L (ref 3.5–5.1)
Sodium: 132 mmol/L — ABNORMAL LOW (ref 135–145)
Total Bilirubin: 0.3 mg/dL (ref 0.3–1.2)
Total Protein: 7.2 g/dL (ref 6.5–8.1)

## 2022-12-04 LAB — RAD ONC ARIA SESSION SUMMARY
Course Elapsed Days: 10
Plan Fractions Treated to Date: 9
Plan Prescribed Dose Per Fraction: 2 Gy
Plan Total Fractions Prescribed: 35
Plan Total Prescribed Dose: 70 Gy
Reference Point Dosage Given to Date: 18 Gy
Reference Point Session Dosage Given: 2 Gy
Session Number: 9

## 2022-12-04 LAB — CBC WITH DIFFERENTIAL (CANCER CENTER ONLY)
Abs Immature Granulocytes: 0.04 10*3/uL (ref 0.00–0.07)
Basophils Absolute: 0 10*3/uL (ref 0.0–0.1)
Basophils Relative: 1 %
Eosinophils Absolute: 0 10*3/uL (ref 0.0–0.5)
Eosinophils Relative: 1 %
HCT: 32.6 % — ABNORMAL LOW (ref 39.0–52.0)
Hemoglobin: 11.6 g/dL — ABNORMAL LOW (ref 13.0–17.0)
Immature Granulocytes: 1 %
Lymphocytes Relative: 5 %
Lymphs Abs: 0.3 10*3/uL — ABNORMAL LOW (ref 0.7–4.0)
MCH: 33.6 pg (ref 26.0–34.0)
MCHC: 35.6 g/dL (ref 30.0–36.0)
MCV: 94.5 fL (ref 80.0–100.0)
Monocytes Absolute: 0.5 10*3/uL (ref 0.1–1.0)
Monocytes Relative: 8 %
Neutro Abs: 4.7 10*3/uL (ref 1.7–7.7)
Neutrophils Relative %: 84 %
Platelet Count: 167 10*3/uL (ref 150–400)
RBC: 3.45 MIL/uL — ABNORMAL LOW (ref 4.22–5.81)
RDW: 14 % (ref 11.5–15.5)
WBC Count: 5.5 10*3/uL (ref 4.0–10.5)
nRBC: 0 % (ref 0.0–0.2)

## 2022-12-04 MED ORDER — HEPARIN SOD (PORK) LOCK FLUSH 100 UNIT/ML IV SOLN
500.0000 [IU] | Freq: Once | INTRAVENOUS | Status: AC | PRN
  Administered 2022-12-04: 500 [IU]
  Filled 2022-12-04: qty 5

## 2022-12-04 MED ORDER — SODIUM CHLORIDE 0.9 % IV SOLN
272.8000 mg | Freq: Once | INTRAVENOUS | Status: AC
  Administered 2022-12-04: 270 mg via INTRAVENOUS
  Filled 2022-12-04: qty 27

## 2022-12-04 MED ORDER — FAMOTIDINE IN NACL 20-0.9 MG/50ML-% IV SOLN
20.0000 mg | Freq: Once | INTRAVENOUS | Status: AC
  Administered 2022-12-04: 20 mg via INTRAVENOUS
  Filled 2022-12-04: qty 50

## 2022-12-04 MED ORDER — HYDROCODONE-ACETAMINOPHEN 5-325 MG PO TABS: 1.0000 | ORAL_TABLET | Freq: Every day | ORAL | 0 refills | Status: DC | PRN

## 2022-12-04 MED ORDER — SODIUM CHLORIDE 0.9 % IV SOLN
Freq: Once | INTRAVENOUS | Status: AC
  Filled 2022-12-04: qty 250

## 2022-12-04 MED ORDER — SODIUM CHLORIDE 0.9 % IV SOLN
45.0000 mg/m2 | Freq: Once | INTRAVENOUS | Status: AC
  Administered 2022-12-04: 90 mg via INTRAVENOUS
  Filled 2022-12-04: qty 15

## 2022-12-04 MED ORDER — DIPHENHYDRAMINE HCL 50 MG/ML IJ SOLN
25.0000 mg | Freq: Once | INTRAMUSCULAR | Status: AC
  Administered 2022-12-04: 25 mg via INTRAVENOUS
  Filled 2022-12-04: qty 1

## 2022-12-04 MED ORDER — SODIUM CHLORIDE 0.9 % IV SOLN
10.0000 mg | Freq: Once | INTRAVENOUS | Status: AC
  Administered 2022-12-04: 10 mg via INTRAVENOUS
  Filled 2022-12-04: qty 10

## 2022-12-04 MED ORDER — PALONOSETRON HCL INJECTION 0.25 MG/5ML
0.2500 mg | Freq: Once | INTRAVENOUS | Status: AC
  Administered 2022-12-04: 0.25 mg via INTRAVENOUS
  Filled 2022-12-04: qty 5

## 2022-12-04 NOTE — Progress Notes (Signed)
Is asking for something stronger for pain than tramadol. States his Left hip still hurts very bad and tramadol does not help.

## 2022-12-04 NOTE — Patient Instructions (Signed)
[facility]  Discharge Instructions: Thank you for choosing [facility] to provide your oncology and hematology care.  If you have a lab appointment with the Cancer Center, please go directly to the Cancer Center and check in at the registration area.  Wear comfortable clothing and clothing appropriate for easy access to any Portacath or PICC line.   We strive to give you quality time with your provider. You may need to reschedule your appointment if you arrive late (15 or more minutes).  Arriving late affects you and other patients whose appointments are after yours.  Also, if you miss three or more appointments without notifying the office, you may be dismissed from the clinic at the provider's discretion.      For prescription refill requests, have your pharmacy contact our office and allow 72 hours for refills to be completed.    Today you received the following chemotherapy and/or immunotherapy agents TAXOL & CARBOPLATIN      To help prevent nausea and vomiting after your treatment, we encourage you to take your nausea medication as directed.  BELOW ARE SYMPTOMS THAT SHOULD BE REPORTED IMMEDIATELY: *FEVER GREATER THAN 100.4 F (38 C) OR HIGHER *CHILLS OR SWEATING *NAUSEA AND VOMITING THAT IS NOT CONTROLLED WITH YOUR NAUSEA MEDICATION *UNUSUAL SHORTNESS OF BREATH *UNUSUAL BRUISING OR BLEEDING *URINARY PROBLEMS (pain or burning when urinating, or frequent urination) *BOWEL PROBLEMS (unusual diarrhea, constipation, pain near the anus) TENDERNESS IN MOUTH AND THROAT WITH OR WITHOUT PRESENCE OF ULCERS (sore throat, sores in mouth, or a toothache) UNUSUAL RASH, SWELLING OR PAIN  UNUSUAL VAGINAL DISCHARGE OR ITCHING   Items with * indicate a potential emergency and should be followed up as soon as possible or go to the Emergency Department if any problems should occur.  Please show the CHEMOTHERAPY ALERT CARD or IMMUNOTHERAPY ALERT CARD at  check-in to the Emergency Department and triage nurse.  Should you have questions after your visit or need to cancel or reschedule your appointment, please contact [facility]  [phone] and follow the prompts.  Office hours are 8:00 a.m. to 4:30 p.m. Monday - Friday. Please note that voicemails left after 4:00 p.m. may not be returned until the following business day.  We are closed weekends and major holidays. You have access to a nurse at all times for urgent questions. Please call the main number to the clinic [phone] and follow the prompts.  For any non-urgent questions, you may also contact your provider using MyChart. We now offer e-Visits for anyone 6818 and older to request care online for non-urgent symptoms. For details visit [email or web address].   Also download the MyChart app! Go to the app store, search "MyChart", open the app, select [facility], and log in with your MyChart username and password.   Paclitaxel Injection What is this medication? PACLITAXEL (PAK li TAX el) treats some types of cancer. It works by slowing down the growth of cancer cells. This medicine may be used for other purposes; ask your health care provider or pharmacist if you have questions. COMMON BRAND NAME(S): Onxol, Taxol What should I tell my care team before I take this medication? They need to know if you have any of these conditions: Heart disease Liver disease Low white blood cell levels An unusual or allergic reaction to paclitaxel, other medications, foods, dyes, or preservatives If you or your partner are pregnant or trying to get pregnant Breast-feeding How should I  use this medication? This medication is injected into a vein. It is given by your care team in a hospital or clinic setting. Talk to your care team about the use of this medication in children. While it may be given to children for selected conditions, precautions do apply. Overdosage: If  you think you have taken too much of this medicine contact a poison control center or emergency room at once. NOTE: This medicine is only for you. Do not share this medicine with others. What if I miss a dose? Keep appointments for follow-up doses. It is important not to miss your dose. Call your care team if you are unable to keep an appointment. What may interact with this medication? Do not take this medication with any of the following: Live virus vaccines Other medications may affect the way this medication works. Talk with your care team about all of the medications you take. They may suggest changes to your treatment plan to lower the risk of side effects and to make sure your medications work as intended. This list may not describe all possible interactions. Give your health care provider a list of all the medicines, herbs, non-prescription drugs, or dietary supplements you use. Also tell them if you smoke, drink alcohol, or use illegal drugs. Some items may interact with your medicine. What should I watch for while using this medication? Your condition will be monitored carefully while you are receiving this medication. You may need blood work while taking this medication. This medication may make you feel generally unwell. This is not uncommon as chemotherapy can affect healthy cells as well as cancer cells. Report any side effects. Continue your course of treatment even though you feel ill unless your care team tells you to stop. This medication can cause serious allergic reactions. To reduce the risk, your care team may give you other medications to take before receiving this one. Be sure to follow the directions from your care team. This medication may increase your risk of getting an infection. Call your care team for advice if you get a fever, chills, sore throat, or other symptoms of a cold or flu. Do not treat yourself. Try to avoid being around people who are sick. This medication may  increase your risk to bruise or bleed. Call your care team if you notice any unusual bleeding. Be careful brushing or flossing your teeth or using a toothpick because you may get an infection or bleed more easily. If you have any dental work done, tell your dentist you are receiving this medication. Talk to your care team if you may be pregnant. Serious birth defects can occur if you take this medication during pregnancy. Talk to your care team before breastfeeding. Changes to your treatment plan may be needed. What side effects may I notice from receiving this medication? Side effects that you should report to your care team as soon as possible: Allergic reactions--skin rash, itching, hives, swelling of the face, lips, tongue, or throat Heart rhythm changes--fast or irregular heartbeat, dizziness, feeling faint or lightheaded, chest pain, trouble breathing Increase in blood pressure Infection--fever, chills, cough, sore throat, wounds that don't heal, pain or trouble when passing urine, general feeling of discomfort or being unwell Low blood pressure--dizziness, feeling faint or lightheaded, blurry vision Low red blood cell level--unusual weakness or fatigue, dizziness, headache, trouble breathing Painful swelling, warmth, or redness of the skin, blisters or sores at the infusion site Pain, tingling, or numbness in the hands or feet Slow  heartbeat--dizziness, feeling faint or lightheaded, confusion, trouble breathing, unusual weakness or fatigue Unusual bruising or bleeding Side effects that usually do not require medical attention (report to your care team if they continue or are bothersome): Diarrhea Hair loss Joint pain Loss of appetite Muscle pain Nausea Vomiting This list may not describe all possible side effects. Call your doctor for medical advice about side effects. You may report side effects to FDA at 1-800-FDA-1088. Where should I keep my medication? This medication is given in  a hospital or clinic. It will not be stored at home. NOTE: This sheet is a summary. It may not cover all possible information. If you have questions about this medicine, talk to your doctor, pharmacist, or health care provider.  2023 Elsevier/Gold Standard (2021-12-19 00:00:00)  Carboplatin Injection What is this medication? CARBOPLATIN (KAR boe pla tin) treats some types of cancer. It works by slowing down the growth of cancer cells. This medicine may be used for other purposes; ask your health care provider or pharmacist if you have questions. COMMON BRAND NAME(S): Paraplatin What should I tell my care team before I take this medication? They need to know if you have any of these conditions: Blood disorders Hearing problems Kidney disease Recent or ongoing radiation therapy An unusual or allergic reaction to carboplatin, cisplatin, other medications, foods, dyes, or preservatives Pregnant or trying to get pregnant Breast-feeding How should I use this medication? This medication is injected into a vein. It is given by your care team in a hospital or clinic setting. Talk to your care team about the use of this medication in children. Special care may be needed. Overdosage: If you think you have taken too much of this medicine contact a poison control center or emergency room at once. NOTE: This medicine is only for you. Do not share this medicine with others. What if I miss a dose? Keep appointments for follow-up doses. It is important not to miss your dose. Call your care team if you are unable to keep an appointment. What may interact with this medication? Medications for seizures Some antibiotics, such as amikacin, gentamicin, neomycin, streptomycin, tobramycin Vaccines This list may not describe all possible interactions. Give your health care provider a list of all the medicines, herbs, non-prescription drugs, or dietary supplements you use. Also tell them if you smoke, drink  alcohol, or use illegal drugs. Some items may interact with your medicine. What should I watch for while using this medication? Your condition will be monitored carefully while you are receiving this medication. You may need blood work while taking this medication. This medication may make you feel generally unwell. This is not uncommon, as chemotherapy can affect healthy cells as well as cancer cells. Report any side effects. Continue your course of treatment even though you feel ill unless your care team tells you to stop. In some cases, you may be given additional medications to help with side effects. Follow all directions for their use. This medication may increase your risk of getting an infection. Call your care team for advice if you get a fever, chills, sore throat, or other symptoms of a cold or flu. Do not treat yourself. Try to avoid being around people who are sick. Avoid taking medications that contain aspirin, acetaminophen, ibuprofen, naproxen, or ketoprofen unless instructed by your care team. These medications may hide a fever. Be careful brushing or flossing your teeth or using a toothpick because you may get an infection or bleed more easily. If   you have any dental work done, tell your dentist you are receiving this medication. Talk to your care team if you wish to become pregnant or think you might be pregnant. This medication can cause serious birth defects. Talk to your care team about effective forms of contraception. Do not breast-feed while taking this medication. What side effects may I notice from receiving this medication? Side effects that you should report to your care team as soon as possible: Allergic reactions--skin rash, itching, hives, swelling of the face, lips, tongue, or throat Infection--fever, chills, cough, sore throat, wounds that don't heal, pain or trouble when passing urine, general feeling of discomfort or being unwell Low red blood cell level--unusual  weakness or fatigue, dizziness, headache, trouble breathing Pain, tingling, or numbness in the hands or feet, muscle weakness, change in vision, confusion or trouble speaking, loss of balance or coordination, trouble walking, seizures Unusual bruising or bleeding Side effects that usually do not require medical attention (report to your care team if they continue or are bothersome): Hair loss Nausea Unusual weakness or fatigue Vomiting This list may not describe all possible side effects. Call your doctor for medical advice about side effects. You may report side effects to FDA at 1-800-FDA-1088. Where should I keep my medication? This medication is given in a hospital or clinic. It will not be stored at home. NOTE: This sheet is a summary. It may not cover all possible information. If you have questions about this medicine, talk to your doctor, pharmacist, or health care provider.  2023 Elsevier/Gold Standard (2007-09-25 00:00:00)     

## 2022-12-04 NOTE — Progress Notes (Signed)
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  INTERVAL HISTORY: Patient returns to clinic today for further evaluation and consideration of cycle 3 of weekly carboplatinum and Taxol.  He has increased right hip pain, but otherwise feels well.  He is tolerating his treatments without significant side effects. He has no neurologic complaints.  He denies any recent fevers or illnesses.  He has a good appetite and denies weight loss.  He denies any chest pain, shortness of breath, cough, or hemoptysis.  He denies any nausea, vomiting, constipation, or diarrhea.  He has no urinary complaints.  Patient offers no further specific complaints today.  REVIEW OF SYSTEMS:   Review of Systems  Constitutional: Negative.  Negative for fever, malaise/fatigue and weight loss.  Respiratory: Negative.  Negative for cough, hemoptysis and shortness of breath.   Cardiovascular: Negative.  Negative for chest pain and leg swelling.  Gastrointestinal: Negative.  Negative for abdominal pain.  Genitourinary: Negative.  Negative for dysuria.  Musculoskeletal:  Positive for joint pain. Negative for back pain.  Skin: Negative.  Negative for rash.  Neurological: Negative.  Negative for dizziness, focal weakness, weakness and headaches.  Psychiatric/Behavioral: Negative.  The patient is not nervous/anxious.     As per HPI. Otherwise, a complete review of systems is  negative.  PAST MEDICAL HISTORY: Past Medical History:  Diagnosis Date   Acute encephalopathy    Acute hypoxic respiratory failure    Alcohol abuse    Allergy    Seasonal   Anemia    Ataxia    BPH associated with nocturia    Brain tumor (benign)    Carpal tunnel syndrome    bilateral   COPD (chronic obstructive pulmonary disease)    Depression    Diabetes mellitus without complication    Dyspnea    Headache    Hypertension    Mass of lower lobe of left lung    Meningioma    Moderate cognitive impairment    Neuropathy    Both legs   Seizures    last seizure was in January   SVT (supraventricular tachycardia) 2018   during intubation in ED    PAST SURGICAL HISTORY: Past Surgical History:  Procedure Laterality Date   CRANIOTOMY Left 11/06/2016   Procedure: LEFT Temporal CRANIOTOMY FOR TUMOR;  Surgeon: Coletta Memos, MD;  Location: [facility] OR;  Service: Neurosurgery;  Laterality: Left;  LEFT Temproal CRANIOTOMY FOR TUMOR    CRANIOTOMY Right 01/20/2020   Procedure: Right frontal craniotomy for tumor resection;  Surgeon: Coletta Memos, MD;  Location: [facility] OR;  Service: Neurosurgery;  Laterality: Right;   HERNIA REPAIR     UMBICIAL   IR IMAGING GUIDED PORT INSERTION  11/18/2022   VASECTOMY     VIDEO BRONCHOSCOPY WITH ENDOBRONCHIAL ULTRASOUND N/A 11/03/2022   Procedure: VIDEO BRONCHOSCOPY WITH ENDOBRONCHIAL ULTRASOUND;  Surgeon: Vida Rigger, MD;  Location: [facility] ORS;  Service: Thoracic;  Laterality: N/A;    FAMILY HISTORY: Family History  Problem Relation Age of Onset   Cancer Sister  Breast    ADVANCED DIRECTIVES (Y/N):  N  HEALTH MAINTENANCE: Social History   Tobacco Use   Smoking status: Every Day    Packs/day: 2.00    Years: 43.00    Additional pack years: 0.00    Total pack years: 86.00    Types: Cigarettes   Smokeless tobacco: Never  Vaping Use   Vaping Use: Never used  Substance Use Topics   Alcohol use: Yes    Comment: 7 beers daily   Drug use: Yes     Types: Marijuana    Comment: everyday     Colonoscopy:  PAP:  Bone density:  Lipid panel:  Allergies  Allergen Reactions   Keppra [Levetiracetam] Anxiety and Other (See Comments)    Severe mood changes   Chantix [Varenicline] Hives   Wasp Venom Swelling   Penicillins Rash    Has patient had a PCN reaction causing immediate rash, facial/tongue/throat swelling, SOB or lightheadedness with hypotension:YES Has patient had a PCN reaction causing severe rash involving mucus membranes or skin necrosis: NO Has patient had a PCN reaction that required hospitalization NO Has patient had a PCN reaction occurring within the last 10 years: NO If all of the above answers are "NO", then may proceed with Cephalosporin use.    Current Outpatient Medications  Medication Sig Dispense Refill   apixaban (ELIQUIS) 5 MG TABS tablet Take 1 tablet (5 mg total) by mouth 2 (two) times daily. 60 tablet 1   Fluticasone-Umeclidin-Vilant (TRELEGY ELLIPTA) 100-62.5-25 MCG/ACT AEPB Inhale 1 puff into the lungs every morning.     gabapentin (NEURONTIN) 400 MG capsule Take 400 mg by mouth 3 (three) times daily.     lamoTRIgine (LAMICTAL) 100 MG tablet Take 1 tablet (100 mg total) by mouth 2 (two) times daily. 60 tablet 5   lidocaine-prilocaine (EMLA) cream Apply to affected area once 30 g 3   nicotine (NICOTROL) 10 MG inhaler Inhale 1 continuous puffing into the lungs as needed for smoking cessation.     ondansetron (ZOFRAN) 8 MG tablet Take 1 tablet (8 mg total) by mouth every 8 (eight) hours as needed for nausea or vomiting. Start on the third day after chemotherapy. 60 tablet 1   predniSONE (DELTASONE) 10 MG tablet Take 10 mg by mouth daily.     prochlorperazine (COMPAZINE) 10 MG tablet Take 1 tablet (10 mg total) by mouth every 6 (six) hours as needed for nausea or vomiting. 60 tablet 1   sulfamethoxazole-trimethoprim (BACTRIM) 400-80 MG tablet Take 1 tablet by mouth 3 (three) times a week.     traMADol  (ULTRAM) 50 MG tablet Take 1 tablet (50 mg total) by mouth every 6 (six) hours as needed. 30 tablet 0   acetaminophen (TYLENOL) 500 MG tablet Take 1,000 mg by mouth every 6 (six) hours as needed. (Patient not taking: Reported on 12/04/2022)     Cholecalciferol (VITAMIN D-1000 MAX ST) 25 MCG (1000 UT) tablet Take by mouth. (Patient not taking: Reported on 12/03/2022)     cholecalciferol (VITAMIN D3) 25 MCG (1000 UNIT) tablet Take 1,000 Units by mouth daily. (Patient not taking: Reported on 12/04/2022)     Fluticasone-Umeclidin-Vilant (TRELEGY ELLIPTA) 100-62.5-25 MCG/ACT AEPB Inhale into the lungs. (Patient not taking: Reported on 12/03/2022)     gabapentin (NEURONTIN) 300 MG capsule Take 1 capsule (300 mg) by mouth once a day for 1 week, then 1 capsule (300 mg) by mouth two times a day for 1 week, then 1 capsule (300 mg) by mouth  three times a day. (Patient not taking: Reported on 12/03/2022) 120 capsule 5   metoprolol succinate (TOPROL XL) 25 MG 24 hr tablet Take 1 tablet (25 mg total) by mouth daily. (Patient not taking: Reported on 12/04/2022) 90 tablet 0   Multiple Vitamin (MULTI-VITAMIN) tablet Take 1 tablet by mouth daily. (Patient not taking: Reported on 12/04/2022)     nystatin (MYCOSTATIN) 100000 UNIT/ML suspension Take 5 mLs by mouth 4 (four) times daily. (Patient not taking: Reported on 12/03/2022)     No current facility-administered medications for this visit.    OBJECTIVE: Vitals:   12/04/22 0852  BP: 125/73  Pulse: 89  Resp: 16  Temp: (!) 96.5 F (35.8 C)  SpO2: 100%     Body mass index is 24.14 kg/m.    ECOG FS:0 - Asymptomatic  General: Well-developed, well-nourished, no acute distress. Eyes: Pink conjunctiva, anicteric sclera. HEENT: Normocephalic, moist mucous membranes. Lungs: No audible wheezing or coughing. Heart: Regular rate and rhythm. Abdomen: Soft, nontender, no obvious distention. Musculoskeletal: No edema, cyanosis, or clubbing. Neuro: Alert, answering all  questions appropriately. Cranial nerves grossly intact. Skin: No rashes or petechiae noted. Psych: Normal affect.  LAB RESULTS:  Lab Results  Component Value Date   NA 132 (L) 12/04/2022   K 3.5 12/04/2022   CL 97 (L) 12/04/2022   CO2 27 12/04/2022   GLUCOSE 119 (H) 12/04/2022   BUN 7 (L) 12/04/2022   CREATININE 0.51 (L) 12/04/2022   CALCIUM 8.4 (L) 12/04/2022   PROT 7.2 12/04/2022   ALBUMIN 3.1 (L) 12/04/2022   AST 20 12/04/2022   ALT 14 12/04/2022   ALKPHOS 70 12/04/2022   BILITOT 0.3 12/04/2022   GFRNONAA >60 12/04/2022   GFRAA >60 01/18/2020    Lab Results  Component Value Date   WBC 5.5 12/04/2022   NEUTROABS 4.7 12/04/2022   HGB 11.6 (L) 12/04/2022   HCT 32.6 (L) 12/04/2022   MCV 94.5 12/04/2022   PLT 167 12/04/2022     STUDIES: IR IMAGING GUIDED PORT INSERTION  Result Date: 11/18/2022 INDICATION: 62 year old male referred for port catheter EXAM: IMAGE GUIDED PORT CATHETER MEDICATIONS: None ANESTHESIA/SEDATION: Moderate (conscious) sedation was employed during this procedure. A total of Versed 2.0 mg and Fentanyl 100 mcg was administered intravenously. Moderate Sedation Time: 22 minutes. The patient's level of consciousness and vital signs were monitored continuously by radiology nursing throughout the procedure under my direct supervision. FLUOROSCOPY TIME:  Fluoroscopy Time:   (1 mGy). COMPLICATIONS: None PROCEDURE: Informed written consent was obtained from the patient after a thorough discussion of the procedural risks, benefits and alternatives. All questions were addressed. Maximal Sterile Barrier Technique was utilized including caps, mask, sterile gowns, sterile gloves, sterile drape, hand hygiene and skin antiseptic. A timeout was performed prior to the initiation of the procedure. Ultrasound survey was performed with images stored and sent to PACs. Right IJ vein documented to be patent. The right neck and chest was prepped with chlorhexidine, and draped in the  usual sterile fashion using maximum barrier technique (cap and mask, sterile gown, sterile gloves, large sterile sheet, hand hygiene and cutaneous antiseptic). Local anesthesia was attained by infiltration with 1% lidocaine without epinephrine. Ultrasound demonstrated patency of the right internal jugular vein, and this was documented with an image. Under real-time ultrasound guidance, this vein was accessed with a 21 gauge micropuncture needle and image documentation was performed. A small dermatotomy was made at the access site with an 11 scalpel. A 0.018" wire was advanced into the  SVC and used to estimate the length of the internal catheter. The access needle exchanged for a 24F micropuncture vascular sheath. The 0.018" wire was then removed and a 0.035" wire advanced into the IVC. An appropriate location for the subcutaneous reservoir was selected below the clavicle and an incision was made through the skin and underlying soft tissues. The subcutaneous tissues were then dissected using a combination of blunt and sharp surgical technique and a pocket was formed. A single lumen power injectable portacatheter was then tunneled through the subcutaneous tissues from the pocket to the dermatotomy and the port reservoir placed within the subcutaneous pocket. The venous access site was then serially dilated and a peel away vascular sheath placed over the wire. The wire was removed and the port catheter advanced into position under fluoroscopic guidance. The catheter tip is positioned in the cavoatrial junction. This was documented with a spot image. The portacatheter was then tested and found to flush and aspirate well. The port was flushed with saline followed by 100 units/mL heparinized saline. The pocket was then closed in two layers using first subdermal inverted interrupted absorbable sutures followed by a running subcuticular suture. The epidermis was then sealed with Dermabond. Steri-Strips applied. The  dermatotomy at the venous access site was also seal with Dermabond and Steri-Strips. Patient tolerated the procedure well and remained hemodynamically stable throughout. No complications encountered and no significant blood loss encountered IMPRESSION: Status post right IJ port catheter. Signed, Yvone Neu. Miachel Roux, RPVI [facility] Radiology Electronically Signed   By: Gilmer Mor D.O.   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  PET scan results from September 11, 2022 reviewed independently confirming stage of disease.  MRI of the brain on September 22, 2022 did not reveal any metastatic disease. He will benefit from concurrent chemotherapy with weekly carboplatin and Taxol with 5-8 cycles along with daily XRT.  Once he completes his concurrent treatment, he will benefit from maintenance durvalumab every 2 weeks for an entire year.  Patient has had port placement.  Proceed with cycle 3 of carboplatinum and Taxol today.  Continue daily XRT.  Return to clinic in 1 week for further evaluation and consideration of cycle 4.   Hyponatremia: Chronic and unchanged.  Patient's sodium is 132. Anemia: Chronic and unchanged.  Patient's hemoglobin is 11.6. Joint pain: Likely arthritis.  Patient states tramadol does not help therefore was given a prescription for Norco.    Patient expressed understanding and was in agreement with this plan. He also understands that He can call clinic at any time with any questions, concerns, or complaints.    Cancer Staging  Squamous cell carcinoma of lower lobe of left lung Staging form: Lung, AJCC 8th Edition - Clinical stage from 11/11/2022: Stage IIIA (cT4, cN1, cM0) - Signed by Jeralyn Ruths, MD on 11/11/2022 Stage prefix: Initial  diagnosis   Jeralyn Ruths, MD   12/04/2022 9:20 AM

## 2022-12-05 ENCOUNTER — Ambulatory Visit
Admission: RE | Admit: 2022-12-05 | Discharge: 2022-12-05 | Disposition: A | Discharge status: Home / Self Care | Payer: Medicare HMO | Source: Ambulatory Visit | Attending: Radiation Oncology | Admitting: Radiation Oncology

## 2022-12-05 ENCOUNTER — Inpatient Hospital Stay: Payer: Medicare HMO

## 2022-12-05 ENCOUNTER — Encounter: Payer: Self-pay | Admitting: *Deleted

## 2022-12-05 ENCOUNTER — Other Ambulatory Visit: Payer: Self-pay

## 2022-12-05 DIAGNOSIS — F17210 Nicotine dependence, cigarettes, uncomplicated: Secondary | ICD-10-CM | POA: Diagnosis not present

## 2022-12-05 DIAGNOSIS — Z510 Encounter for antineoplastic radiation therapy: Secondary | ICD-10-CM | POA: Diagnosis not present

## 2022-12-05 DIAGNOSIS — C3432 Malignant neoplasm of lower lobe, left bronchus or lung: Secondary | ICD-10-CM | POA: Diagnosis not present

## 2022-12-05 LAB — RAD ONC ARIA SESSION SUMMARY
Course Elapsed Days: 11
Plan Fractions Treated to Date: 10
Plan Prescribed Dose Per Fraction: 2 Gy
Plan Total Fractions Prescribed: 35
Plan Total Prescribed Dose: 70 Gy
Reference Point Dosage Given to Date: 20 Gy
Reference Point Session Dosage Given: 2 Gy
Session Number: 10

## 2022-12-06 ENCOUNTER — Other Ambulatory Visit: Payer: Self-pay | Admitting: Oncology

## 2022-12-06 DIAGNOSIS — C3432 Malignant neoplasm of lower lobe, left bronchus or lung: Secondary | ICD-10-CM

## 2022-12-08 ENCOUNTER — Encounter: Payer: Self-pay | Admitting: *Deleted

## 2022-12-08 ENCOUNTER — Ambulatory Visit
Admission: RE | Admit: 2022-12-08 | Discharge: 2022-12-08 | Disposition: A | Discharge status: Home / Self Care | Payer: Medicare HMO | Source: Ambulatory Visit | Attending: Radiation Oncology | Admitting: Radiation Oncology

## 2022-12-08 ENCOUNTER — Other Ambulatory Visit: Payer: Self-pay

## 2022-12-08 ENCOUNTER — Inpatient Hospital Stay: Payer: Medicare HMO

## 2022-12-08 DIAGNOSIS — C3432 Malignant neoplasm of lower lobe, left bronchus or lung: Secondary | ICD-10-CM | POA: Diagnosis not present

## 2022-12-08 DIAGNOSIS — F17210 Nicotine dependence, cigarettes, uncomplicated: Secondary | ICD-10-CM | POA: Diagnosis not present

## 2022-12-08 DIAGNOSIS — Z510 Encounter for antineoplastic radiation therapy: Secondary | ICD-10-CM | POA: Diagnosis not present

## 2022-12-08 LAB — RAD ONC ARIA SESSION SUMMARY
Course Elapsed Days: 14
Plan Fractions Treated to Date: 11
Plan Prescribed Dose Per Fraction: 2 Gy
Plan Total Fractions Prescribed: 35
Plan Total Prescribed Dose: 70 Gy
Reference Point Dosage Given to Date: 22 Gy
Reference Point Session Dosage Given: 2 Gy
Session Number: 11

## 2022-12-09 ENCOUNTER — Encounter: Payer: Self-pay | Admitting: *Deleted

## 2022-12-09 ENCOUNTER — Ambulatory Visit
Admission: RE | Admit: 2022-12-09 | Discharge: 2022-12-09 | Disposition: A | Discharge status: Home / Self Care | Payer: Medicare HMO | Source: Ambulatory Visit | Attending: Radiation Oncology | Admitting: Radiation Oncology

## 2022-12-09 ENCOUNTER — Inpatient Hospital Stay: Payer: Medicare HMO

## 2022-12-09 ENCOUNTER — Other Ambulatory Visit: Payer: Self-pay

## 2022-12-09 ENCOUNTER — Inpatient Hospital Stay: Payer: Medicare HMO | Admitting: Licensed Clinical Social Worker

## 2022-12-09 DIAGNOSIS — Z510 Encounter for antineoplastic radiation therapy: Secondary | ICD-10-CM | POA: Diagnosis not present

## 2022-12-09 DIAGNOSIS — F17210 Nicotine dependence, cigarettes, uncomplicated: Secondary | ICD-10-CM | POA: Diagnosis not present

## 2022-12-09 DIAGNOSIS — C3432 Malignant neoplasm of lower lobe, left bronchus or lung: Secondary | ICD-10-CM

## 2022-12-09 LAB — RAD ONC ARIA SESSION SUMMARY
Course Elapsed Days: 15
Plan Fractions Treated to Date: 12
Plan Prescribed Dose Per Fraction: 2 Gy
Plan Total Fractions Prescribed: 35
Plan Total Prescribed Dose: 70 Gy
Reference Point Dosage Given to Date: 24 Gy
Reference Point Session Dosage Given: 2 Gy
Session Number: 12

## 2022-12-09 NOTE — Progress Notes (Signed)
[facility] Clinical Social Work  Initial Assessment   Frederick Drake is a 62 y.o. year old male contacted caregiver by phone. Clinical Social Work was referred by medical provider for assessment of psychosocial needs.   SDOH (Social Determinants of Health) assessments performed: Yes SDOH Interventions    Flowsheet Row Clinical Support from 12/09/2022 in [facility] Most recent reading at 12/09/2022  2:41 PM Documentation from 12/09/2022 in [facility] [place] Most recent reading at 12/09/2022 10:15 AM Documentation from 12/08/2022 in [facility] [place] Most recent reading at 12/08/2022 11:30 AM Documentation from 12/05/2022 in [facility] [place] Most recent reading at 12/05/2022 10:38 AM Documentation from 12/04/2022 in [facility] [place] Most recent reading at 12/04/2022 10:48 AM Documentation from 12/01/2022 in [facility] [place] Most recent reading at 12/01/2022  3:28 PM  SDOH Interventions        Food Insecurity Interventions Other (Comment)  [Food card assistance, food pantry assistance] -- -- -- -- --  Housing Interventions Intervention Not Indicated -- -- -- -- --  Transportation Interventions -- [employer] Van ([facility] Only) [employer] Zenaida Niece ([place] [facility] Only) [employer] Zenaida Niece ([facility] [facility]) [employer] Zenaida Niece ([facility] Only) [employer] Zenaida Niece ( [facility] Ctr[facility] Only)  Utilities Interventions Intervention Not Indicated -- -- -- -- --  Alcohol Usage Interventions Intervention Not Indicated (Score <7) -- -- -- -- --  Depression Interventions/Treatment  Counseling -- -- -- -- --  Financial Strain Interventions Intervention Not Indicated, Other (Comment)  [Financial fund assistance, and financial navigator] -- -- -- -- --  Physical Activity Interventions Intervention Not Indicated -- -- -- -- --  Stress Interventions  Provide Counseling -- -- -- -- --  Social Connections Interventions Intervention Not Indicated -- -- -- -- --       SDOH Screenings   Food Insecurity: Food Insecurity Present (12/09/2022)  Housing: Low Risk  (12/09/2022)  Transportation Needs: Unmet Transportation Needs (12/09/2022)  Utilities: Not At Risk (12/09/2022)  Alcohol Screen: Low Risk  (12/09/2022)  Depression (PHQ2-9): Medium Risk (12/09/2022)  Financial Resource Strain: Medium Risk (12/09/2022)  Physical Activity: Inactive (12/09/2022)  Social Connections: Socially Isolated (12/09/2022)  Stress: Stress Concern Present (12/09/2022)  Tobacco Use: High Risk (12/04/2022)     Distress Screen completed: No     No data to display            Family/Social Information:  Housing Arrangement: patient lives with spouse and main contact,  Frederick, Drake [phone]  Family members/support persons in your life? Family and Medical Staff Transportation concerns: yes, patient does not drive, using [employer] van transportation assistance  Employment: Disabled  .  Income source: Secretary/administrator concerns: Yes, current concerns Type of concernEngineer, manufacturing, Medical bills, and Food Food access concerns: yes, interested in food card assistance and food pantry assistance Religious or spiritual practice: Data processing manager Currently in place:  SS Disability, [employer] Medicare  Coping/ Adjustment to diagnosis: Patient understands treatment plan and what happens next? yes Concerns about diagnosis and/or treatment: Afraid of cancer, How will I care for myself, and general financial concerns Patient reported stressors: Transportation, Food, Depression, and Anxiety/ nervousness and physical issues Hopes and/or priorities:   Patient enjoys watching TV Current coping skills/ strengths: Average or above average intelligence , Communication skills , General fund of knowledge , Motivation for treatment/growth , Religious  Affiliation , and Supportive family/friends  SUMMARY: Current SDOH Barriers:  Financial constraints related to fixed income, Limited social support, Transportation, Level of care concerns, ADL IADL limitations, Mental Health Concerns , and Social Isolation  Clinical Social Work Clinical Goal(s):  No clinical social work goals at this time  Interventions: Discussed common feeling and emotions when being diagnosed with cancer, and the importance of support during treatment Informed patient of the support team roles and support services at [facility] Provided CSW contact information and encouraged patient to call with any questions or concerns Referred patient to food assistance cards, food pantry, Physiological scientist, financial grant assistance and Provided patient with information about CSW role in patient care other available resources. Ms. Tinkey will contact CSW to schedule counseling appointment.   Follow Up Plan: Patient will contact CSW with any support or resource needs Patient verbalizes understanding of plan: Yes    Briel Gallicchio, LCSW

## 2022-12-10 ENCOUNTER — Ambulatory Visit
Admission: RE | Admit: 2022-12-10 | Discharge: 2022-12-10 | Disposition: A | Discharge status: Home / Self Care | Payer: Medicare HMO | Source: Ambulatory Visit | Attending: Radiation Oncology | Admitting: Radiation Oncology

## 2022-12-10 ENCOUNTER — Other Ambulatory Visit: Payer: Self-pay

## 2022-12-10 ENCOUNTER — Encounter: Payer: Self-pay | Admitting: *Deleted

## 2022-12-10 ENCOUNTER — Inpatient Hospital Stay: Payer: Medicare HMO

## 2022-12-10 DIAGNOSIS — Z510 Encounter for antineoplastic radiation therapy: Secondary | ICD-10-CM | POA: Diagnosis not present

## 2022-12-10 DIAGNOSIS — F17210 Nicotine dependence, cigarettes, uncomplicated: Secondary | ICD-10-CM | POA: Diagnosis not present

## 2022-12-10 DIAGNOSIS — C3432 Malignant neoplasm of lower lobe, left bronchus or lung: Secondary | ICD-10-CM | POA: Diagnosis not present

## 2022-12-10 LAB — RAD ONC ARIA SESSION SUMMARY
Course Elapsed Days: 16
Plan Fractions Treated to Date: 13
Plan Prescribed Dose Per Fraction: 2 Gy
Plan Total Fractions Prescribed: 35
Plan Total Prescribed Dose: 70 Gy
Reference Point Dosage Given to Date: 26 Gy
Reference Point Session Dosage Given: 2 Gy
Session Number: 13

## 2022-12-10 MED FILL — Dexamethasone Sodium Phosphate Inj 100 MG/10ML: INTRAMUSCULAR | Qty: 1 | Status: AC

## 2022-12-10 NOTE — Progress Notes (Signed)
Spoke with Lahoma Rocker, he will be in today to sign paperwork for the [employer] and [employer], he will also bring in proof of income.

## 2022-12-10 NOTE — Progress Notes (Signed)
Enrolled Frederick Drake into the [employer] and the [employer]

## 2022-12-11 ENCOUNTER — Encounter: Payer: Self-pay | Admitting: Oncology

## 2022-12-11 ENCOUNTER — Other Ambulatory Visit: Payer: Self-pay

## 2022-12-11 ENCOUNTER — Inpatient Hospital Stay: Payer: Medicare HMO

## 2022-12-11 ENCOUNTER — Inpatient Hospital Stay (HOSPITAL_BASED_OUTPATIENT_CLINIC_OR_DEPARTMENT_OTHER): Payer: Medicare HMO | Admitting: Oncology

## 2022-12-11 ENCOUNTER — Ambulatory Visit
Admission: RE | Admit: 2022-12-11 | Discharge: 2022-12-11 | Disposition: A | Discharge status: Home / Self Care | Payer: Medicare HMO | Source: Ambulatory Visit | Attending: Radiation Oncology | Admitting: Radiation Oncology

## 2022-12-11 DIAGNOSIS — I10 Essential (primary) hypertension: Secondary | ICD-10-CM

## 2022-12-11 DIAGNOSIS — Z510 Encounter for antineoplastic radiation therapy: Secondary | ICD-10-CM | POA: Diagnosis not present

## 2022-12-11 DIAGNOSIS — E871 Hypo-osmolality and hyponatremia: Secondary | ICD-10-CM | POA: Diagnosis not present

## 2022-12-11 DIAGNOSIS — F17210 Nicotine dependence, cigarettes, uncomplicated: Secondary | ICD-10-CM | POA: Diagnosis not present

## 2022-12-11 DIAGNOSIS — C3432 Malignant neoplasm of lower lobe, left bronchus or lung: Secondary | ICD-10-CM

## 2022-12-11 DIAGNOSIS — I2510 Atherosclerotic heart disease of native coronary artery without angina pectoris: Secondary | ICD-10-CM

## 2022-12-11 DIAGNOSIS — D649 Anemia, unspecified: Secondary | ICD-10-CM | POA: Diagnosis not present

## 2022-12-11 DIAGNOSIS — Z79899 Other long term (current) drug therapy: Secondary | ICD-10-CM | POA: Diagnosis not present

## 2022-12-11 DIAGNOSIS — I4892 Unspecified atrial flutter: Secondary | ICD-10-CM

## 2022-12-11 DIAGNOSIS — Z5111 Encounter for antineoplastic chemotherapy: Secondary | ICD-10-CM | POA: Diagnosis not present

## 2022-12-11 DIAGNOSIS — I471 Supraventricular tachycardia, unspecified: Secondary | ICD-10-CM

## 2022-12-11 LAB — CBC WITH DIFFERENTIAL (CANCER CENTER ONLY)
Abs Immature Granulocytes: 0.03 10*3/uL (ref 0.00–0.07)
Basophils Absolute: 0 10*3/uL (ref 0.0–0.1)
Basophils Relative: 1 %
Eosinophils Absolute: 0 10*3/uL (ref 0.0–0.5)
Eosinophils Relative: 0 %
HCT: 36 % — ABNORMAL LOW (ref 39.0–52.0)
Hemoglobin: 12.6 g/dL — ABNORMAL LOW (ref 13.0–17.0)
Immature Granulocytes: 1 %
Lymphocytes Relative: 5 %
Lymphs Abs: 0.3 10*3/uL — ABNORMAL LOW (ref 0.7–4.0)
MCH: 33.2 pg (ref 26.0–34.0)
MCHC: 35 g/dL (ref 30.0–36.0)
MCV: 94.7 fL (ref 80.0–100.0)
Monocytes Absolute: 0.3 10*3/uL (ref 0.1–1.0)
Monocytes Relative: 5 %
Neutro Abs: 4.5 10*3/uL (ref 1.7–7.7)
Neutrophils Relative %: 88 %
Platelet Count: 153 10*3/uL (ref 150–400)
RBC: 3.8 MIL/uL — ABNORMAL LOW (ref 4.22–5.81)
RDW: 15.8 % — ABNORMAL HIGH (ref 11.5–15.5)
WBC Count: 5.1 10*3/uL (ref 4.0–10.5)
nRBC: 0 % (ref 0.0–0.2)

## 2022-12-11 LAB — CMP (CANCER CENTER ONLY)
ALT: 16 U/L (ref 0–44)
AST: 20 U/L (ref 15–41)
Albumin: 3.4 g/dL — ABNORMAL LOW (ref 3.5–5.0)
Alkaline Phosphatase: 63 U/L (ref 38–126)
Anion gap: 5 (ref 5–15)
BUN: 8 mg/dL (ref 8–23)
CO2: 28 mmol/L (ref 22–32)
Calcium: 8.7 mg/dL — ABNORMAL LOW (ref 8.9–10.3)
Chloride: 100 mmol/L (ref 98–111)
Creatinine: 0.58 mg/dL — ABNORMAL LOW (ref 0.61–1.24)
GFR, Estimated: >60 mL/min
Glucose, Bld: 117 mg/dL — ABNORMAL HIGH (ref 70–99)
Potassium: 4.1 mmol/L (ref 3.5–5.1)
Sodium: 133 mmol/L — ABNORMAL LOW (ref 135–145)
Total Bilirubin: 0.5 mg/dL (ref 0.3–1.2)
Total Protein: 7.2 g/dL (ref 6.5–8.1)

## 2022-12-11 LAB — RAD ONC ARIA SESSION SUMMARY
Course Elapsed Days: 17
Plan Fractions Treated to Date: 14
Plan Prescribed Dose Per Fraction: 2 Gy
Plan Total Fractions Prescribed: 35
Plan Total Prescribed Dose: 70 Gy
Reference Point Dosage Given to Date: 28 Gy
Reference Point Session Dosage Given: 2 Gy
Session Number: 14

## 2022-12-11 LAB — TSH: TSH: 1.205 u[IU]/mL (ref 0.350–4.500)

## 2022-12-11 LAB — MAGNESIUM: Magnesium: 2 mg/dL (ref 1.7–2.4)

## 2022-12-11 MED ORDER — SODIUM CHLORIDE 0.9 % IV SOLN
272.8000 mg | Freq: Once | INTRAVENOUS | Status: AC
  Administered 2022-12-11: 270 mg via INTRAVENOUS
  Filled 2022-12-11: qty 27

## 2022-12-11 MED ORDER — SODIUM CHLORIDE 0.9 % IV SOLN
Freq: Once | INTRAVENOUS | Status: AC
  Filled 2022-12-11: qty 250

## 2022-12-11 MED ORDER — HEPARIN SOD (PORK) LOCK FLUSH 100 UNIT/ML IV SOLN
INTRAVENOUS | Status: AC
  Administered 2022-12-11: 500 [IU]
  Filled 2022-12-11: qty 5

## 2022-12-11 MED ORDER — SODIUM CHLORIDE 0.9 % IV SOLN
45.0000 mg/m2 | Freq: Once | INTRAVENOUS | Status: AC
  Administered 2022-12-11: 90 mg via INTRAVENOUS
  Filled 2022-12-11: qty 15

## 2022-12-11 MED ORDER — DIPHENHYDRAMINE HCL 50 MG/ML IJ SOLN
25.0000 mg | Freq: Once | INTRAMUSCULAR | Status: AC
  Administered 2022-12-11: 25 mg via INTRAVENOUS
  Filled 2022-12-11: qty 1

## 2022-12-11 MED ORDER — SODIUM CHLORIDE 0.9 % IV SOLN
10.0000 mg | Freq: Once | INTRAVENOUS | Status: AC
  Administered 2022-12-11: 10 mg via INTRAVENOUS
  Filled 2022-12-11: qty 10

## 2022-12-11 MED ORDER — FAMOTIDINE IN NACL 20-0.9 MG/50ML-% IV SOLN
20.0000 mg | Freq: Once | INTRAVENOUS | Status: AC
  Administered 2022-12-11: 20 mg via INTRAVENOUS
  Filled 2022-12-11: qty 50

## 2022-12-11 MED ORDER — PALONOSETRON HCL INJECTION 0.25 MG/5ML
0.2500 mg | Freq: Once | INTRAVENOUS | Status: AC
  Administered 2022-12-11: 0.25 mg via INTRAVENOUS
  Filled 2022-12-11: qty 5

## 2022-12-11 NOTE — Progress Notes (Signed)
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  INTERVAL HISTORY: Patient returns to clinic today for further evaluation and consideration of cycle 4 of weekly carboplatin and Taxol along with his daily XRT.  His hip pain is better controlled today.  He currently feels well.  He continues to tolerate his treatments without significant side effects.  He has no neurologic complaints.  He denies any recent fevers or illnesses.  He has a good appetite and denies weight loss.  He denies any chest pain, shortness of breath, cough, or hemoptysis.  He denies any nausea, vomiting, constipation, or diarrhea.  He has no urinary complaints.  Patient offers no specific complaints today.  REVIEW OF SYSTEMS:   Review of Systems  Constitutional: Negative.  Negative for fever, malaise/fatigue and weight loss.  Respiratory: Negative.  Negative for cough, hemoptysis and shortness of breath.   Cardiovascular: Negative.  Negative for chest pain and leg swelling.  Gastrointestinal: Negative.  Negative for abdominal pain.  Genitourinary: Negative.  Negative for dysuria.  Musculoskeletal: Negative.  Negative for back pain and joint pain.  Skin: Negative.  Negative for rash.  Neurological: Negative.  Negative for dizziness, focal weakness, weakness and headaches.  Psychiatric/Behavioral: Negative.  The patient is not nervous/anxious.     As per HPI. Otherwise,  a complete review of systems is negative.  PAST MEDICAL HISTORY: Past Medical History:  Diagnosis Date   Acute encephalopathy    Acute hypoxic respiratory failure    Alcohol abuse    Allergy    Seasonal   Anemia    Ataxia    BPH associated with nocturia    Brain tumor (benign)    Carpal tunnel syndrome    bilateral   COPD (chronic obstructive pulmonary disease)    Depression    Diabetes mellitus without complication    Dyspnea    Headache    Hypertension    Mass of lower lobe of left lung    Meningioma    Moderate cognitive impairment    Neuropathy    Both legs   Seizures    last seizure was in January   SVT (supraventricular tachycardia) 2018   during intubation in ED    PAST SURGICAL HISTORY: Past Surgical History:  Procedure Laterality Date   CRANIOTOMY Left 11/06/2016   Procedure: LEFT Temporal CRANIOTOMY FOR TUMOR;  Surgeon: Coletta Memos, MD;  Location: [facility] OR;  Service: Neurosurgery;  Laterality: Left;  LEFT Temproal CRANIOTOMY FOR TUMOR    CRANIOTOMY Right 01/20/2020   Procedure: Right frontal craniotomy for tumor resection;  Surgeon: Coletta Memos, MD;  Location: [facility] OR;  Service: Neurosurgery;  Laterality: Right;   HERNIA REPAIR     UMBICIAL   IR IMAGING GUIDED PORT INSERTION  11/18/2022   VASECTOMY     VIDEO BRONCHOSCOPY WITH ENDOBRONCHIAL ULTRASOUND N/A 11/03/2022   Procedure: VIDEO BRONCHOSCOPY WITH ENDOBRONCHIAL ULTRASOUND;  Surgeon: Vida Rigger, MD;  Location: [facility] ORS;  Service: Thoracic;  Laterality: N/A;    FAMILY HISTORY: Family History  Problem Relation  Age of Onset   Cancer Sister        Breast    ADVANCED DIRECTIVES (Y/N):  N  HEALTH MAINTENANCE: Social History   Tobacco Use   Smoking status: Every Day    Packs/day: 2.00    Years: 43.00    Additional pack years: 0.00    Total pack years: 86.00    Types: Cigarettes   Smokeless tobacco: Never  Vaping Use   Vaping Use: Never used  Substance Use Topics   Alcohol use: Yes    Comment:  7 beers daily   Drug use: Yes    Types: Marijuana    Comment: everyday     Colonoscopy:  PAP:  Bone density:  Lipid panel:  Allergies  Allergen Reactions   Keppra [Levetiracetam] Anxiety and Other (See Comments)    Severe mood changes   Chantix [Varenicline] Hives   Wasp Venom Swelling   Penicillins Rash    Has patient had a PCN reaction causing immediate rash, facial/tongue/throat swelling, SOB or lightheadedness with hypotension:YES Has patient had a PCN reaction causing severe rash involving mucus membranes or skin necrosis: NO Has patient had a PCN reaction that required hospitalization NO Has patient had a PCN reaction occurring within the last 10 years: NO If all of the above answers are "NO", then may proceed with Cephalosporin use.    Current Outpatient Medications  Medication Sig Dispense Refill   apixaban (ELIQUIS) 5 MG TABS tablet Take 1 tablet (5 mg total) by mouth 2 (two) times daily. 60 tablet 1   gabapentin (NEURONTIN) 400 MG capsule Take 400 mg by mouth 3 (three) times daily.     HYDROcodone-acetaminophen (NORCO) 5-325 MG tablet Take 1 tablet by mouth daily as needed for moderate pain. 30 tablet 0   lamoTRIgine (LAMICTAL) 100 MG tablet Take 1 tablet (100 mg total) by mouth 2 (two) times daily. 60 tablet 5   metoprolol succinate (TOPROL XL) 25 MG 24 hr tablet Take 1 tablet (25 mg total) by mouth daily. 90 tablet 0   nicotine (NICOTROL) 10 MG inhaler Inhale 1 continuous puffing into the lungs as needed for smoking cessation.     ondansetron (ZOFRAN) 8 MG tablet Take 1 tablet (8 mg total) by mouth every 8 (eight) hours as needed for nausea or vomiting. Start on the third day after chemotherapy. 60 tablet 1   predniSONE (DELTASONE) 10 MG tablet Take 10 mg by mouth daily.     prochlorperazine (COMPAZINE) 10 MG tablet TAKE 1 TABLET(10 MG) BY MOUTH EVERY 6 HOURS AS NEEDED FOR NAUSEA OR VOMITING 60 tablet 1   sulfamethoxazole-trimethoprim (BACTRIM) 400-80 MG tablet Take 1  tablet by mouth 3 (three) times a week.     acetaminophen (TYLENOL) 500 MG tablet Take 1,000 mg by mouth every 6 (six) hours as needed. (Patient not taking: Reported on 12/04/2022)     Cholecalciferol (VITAMIN D-1000 MAX ST) 25 MCG (1000 UT) tablet Take by mouth. (Patient not taking: Reported on 12/03/2022)     cholecalciferol (VITAMIN D3) 25 MCG (1000 UNIT) tablet Take 1,000 Units by mouth daily. (Patient not taking: Reported on 12/04/2022)     Fluticasone-Umeclidin-Vilant (TRELEGY ELLIPTA) 100-62.5-25 MCG/ACT AEPB Inhale 1 puff into the lungs every morning. (Patient not taking: Reported on 12/11/2022)     Fluticasone-Umeclidin-Vilant (TRELEGY ELLIPTA) 100-62.5-25 MCG/ACT AEPB Inhale into the lungs. (Patient not taking: Reported on 12/03/2022)     gabapentin (NEURONTIN) 300 MG capsule Take 1 capsule (300 mg) by mouth once a day  for 1 week, then 1 capsule (300 mg) by mouth two times a day for 1 week, then 1 capsule (300 mg) by mouth three times a day. (Patient not taking: Reported on 12/03/2022) 120 capsule 5   lidocaine-prilocaine (EMLA) cream Apply to affected area once 30 g 3   Multiple Vitamin (MULTI-VITAMIN) tablet Take 1 tablet by mouth daily. (Patient not taking: Reported on 12/04/2022)     nystatin (MYCOSTATIN) 100000 UNIT/ML suspension Take 5 mLs by mouth 4 (four) times daily. (Patient not taking: Reported on 12/03/2022)     traMADol (ULTRAM) 50 MG tablet Take 1 tablet (50 mg total) by mouth every 6 (six) hours as needed. (Patient not taking: Reported on 12/11/2022) 30 tablet 0   No current facility-administered medications for this visit.    OBJECTIVE: Vitals:   12/11/22 0849  BP: (!) 140/82  Pulse: 94  Resp: 16  Temp: (!) 97.1 F (36.2 C)  SpO2: 98%     Body mass index is 23.35 kg/m.    ECOG FS:0 - Asymptomatic  General: Well-developed, well-nourished, no acute distress.  Sitting in wheelchair. Eyes: Pink conjunctiva, anicteric sclera. HEENT: Normocephalic, moist mucous  membranes. Lungs: No audible wheezing or coughing. Heart: Regular rate and rhythm. Abdomen: Soft, nontender, no obvious distention. Musculoskeletal: No edema, cyanosis, or clubbing. Neuro: Alert, answering all questions appropriately. Cranial nerves grossly intact. Skin: No rashes or petechiae noted. Psych: Normal affect.  LAB RESULTS:  Lab Results  Component Value Date   NA 133 (L) 12/11/2022   K 4.1 12/11/2022   CL 100 12/11/2022   CO2 28 12/11/2022   GLUCOSE 117 (H) 12/11/2022   BUN 8 12/11/2022   CREATININE 0.58 (L) 12/11/2022   CALCIUM 8.7 (L) 12/11/2022   PROT 7.2 12/11/2022   ALBUMIN 3.4 (L) 12/11/2022   AST 20 12/11/2022   ALT 16 12/11/2022   ALKPHOS 63 12/11/2022   BILITOT 0.5 12/11/2022   GFRNONAA >60 12/11/2022   GFRAA >60 01/18/2020    Lab Results  Component Value Date   WBC 5.1 12/11/2022   NEUTROABS 4.5 12/11/2022   HGB 12.6 (L) 12/11/2022   HCT 36.0 (L) 12/11/2022   MCV 94.7 12/11/2022   PLT 153 12/11/2022     STUDIES: IR IMAGING GUIDED PORT INSERTION  Result Date: 11/18/2022 INDICATION: 62 year old male referred for port catheter EXAM: IMAGE GUIDED PORT CATHETER MEDICATIONS: None ANESTHESIA/SEDATION: Moderate (conscious) sedation was employed during this procedure. A total of Versed 2.0 mg and Fentanyl 100 mcg was administered intravenously. Moderate Sedation Time: 22 minutes. The patient's level of consciousness and vital signs were monitored continuously by radiology nursing throughout the procedure under my direct supervision. FLUOROSCOPY TIME:  Fluoroscopy Time:   (1 mGy). COMPLICATIONS: None PROCEDURE: Informed written consent was obtained from the patient after a thorough discussion of the procedural risks, benefits and alternatives. All questions were addressed. Maximal Sterile Barrier Technique was utilized including caps, mask, sterile gowns, sterile gloves, sterile drape, hand hygiene and skin antiseptic. A timeout was performed prior to the  initiation of the procedure. Ultrasound survey was performed with images stored and sent to PACs. Right IJ vein documented to be patent. The right neck and chest was prepped with chlorhexidine, and draped in the usual sterile fashion using maximum barrier technique (cap and mask, sterile gown, sterile gloves, large sterile sheet, hand hygiene and cutaneous antiseptic). Local anesthesia was attained by infiltration with 1% lidocaine without epinephrine. Ultrasound demonstrated patency of the right internal jugular vein, and this was documented with  an image. Under real-time ultrasound guidance, this vein was accessed with a 21 gauge micropuncture needle and image documentation was performed. A small dermatotomy was made at the access site with an 11 scalpel. A 0.018" wire was advanced into the SVC and used to estimate the length of the internal catheter. The access needle exchanged for a 35F micropuncture vascular sheath. The 0.018" wire was then removed and a 0.035" wire advanced into the IVC. An appropriate location for the subcutaneous reservoir was selected below the clavicle and an incision was made through the skin and underlying soft tissues. The subcutaneous tissues were then dissected using a combination of blunt and sharp surgical technique and a pocket was formed. A single lumen power injectable portacatheter was then tunneled through the subcutaneous tissues from the pocket to the dermatotomy and the port reservoir placed within the subcutaneous pocket. The venous access site was then serially dilated and a peel away vascular sheath placed over the wire. The wire was removed and the port catheter advanced into position under fluoroscopic guidance. The catheter tip is positioned in the cavoatrial junction. This was documented with a spot image. The portacatheter was then tested and found to flush and aspirate well. The port was flushed with saline followed by 100 units/mL heparinized saline. The pocket was  then closed in two layers using first subdermal inverted interrupted absorbable sutures followed by a running subcuticular suture. The epidermis was then sealed with Dermabond. Steri-Strips applied. The dermatotomy at the venous access site was also seal with Dermabond and Steri-Strips. Patient tolerated the procedure well and remained hemodynamically stable throughout. No complications encountered and no significant blood loss encountered IMPRESSION: Status post right IJ port catheter. Signed, Yvone Neu. Miachel Roux, RPVI [facility] Radiology Electronically Signed   By: Gilmer Mor D.O.   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  PET scan results from September 11, 2022 reviewed independently confirming stage of disease.  MRI of the brain on September 22, 2022 did not reveal any metastatic disease. He will benefit from concurrent chemotherapy with weekly carboplatin and Taxol with 5-8 cycles along with daily XRT.  Once he completes his concurrent treatment, he will benefit from maintenance durvalumab every 2 weeks for an entire year.  Patient has had port placement.  Proceed with cycle 4 of treatment today.  Continue daily XRT.  Return to clinic in 1 week for further evaluation and consideration of cycle 5.     Hyponatremia: Chronic and unchanged.  Patient sodium is 133. Anemia: Hemoglobin improved to 12.6. Joint pain: Likely secondary to arthritis and unrelated to malignancy.  Continue Norco as prescribed.  Will give a referral to orthopedics at the conclusion of chemotherapy.  Patient expressed understanding and was in agreement with this plan. He also understands that He can call clinic at any time with any questions, concerns, or complaints.    Cancer Staging  Squamous  cell carcinoma of lower lobe of left lung Staging form: Lung, AJCC 8th Edition - Clinical stage from 11/11/2022: Stage IIIA (cT4, cN1, cM0) - Signed by Jeralyn Ruths, MD on 11/11/2022 Stage prefix: Initial diagnosis   Jeralyn Ruths, MD   12/11/2022 9:18 AM

## 2022-12-11 NOTE — Patient Instructions (Signed)
[facility]  Discharge Instructions: Thank you for choosing [facility] to provide your oncology and hematology care.  If you have a lab appointment with the Cancer Center, please go directly to the Cancer Center and check in at the registration area.  Wear comfortable clothing and clothing appropriate for easy access to any Portacath or PICC line.   We strive to give you quality time with your provider. You may need to reschedule your appointment if you arrive late (15 or more minutes).  Arriving late affects you and other patients whose appointments are after yours.  Also, if you miss three or more appointments without notifying the office, you may be dismissed from the clinic at the provider's discretion.      For prescription refill requests, have your pharmacy contact our office and allow 72 hours for refills to be completed.     To help prevent nausea and vomiting after your treatment, we encourage you to take your nausea medication as directed.  BELOW ARE SYMPTOMS THAT SHOULD BE REPORTED IMMEDIATELY: *FEVER GREATER THAN 100.4 F (38 C) OR HIGHER *CHILLS OR SWEATING *NAUSEA AND VOMITING THAT IS NOT CONTROLLED WITH YOUR NAUSEA MEDICATION *UNUSUAL SHORTNESS OF BREATH *UNUSUAL BRUISING OR BLEEDING *URINARY PROBLEMS (pain or burning when urinating, or frequent urination) *BOWEL PROBLEMS (unusual diarrhea, constipation, pain near the anus) TENDERNESS IN MOUTH AND THROAT WITH OR WITHOUT PRESENCE OF ULCERS (sore throat, sores in mouth, or a toothache) UNUSUAL RASH, SWELLING OR PAIN  UNUSUAL VAGINAL DISCHARGE OR ITCHING   Items with * indicate a potential emergency and should be followed up as soon as possible or go to the Emergency Department if any problems should occur.  Please show the CHEMOTHERAPY ALERT CARD or IMMUNOTHERAPY ALERT CARD at check-in to the Emergency Department and triage nurse.  Should you have questions after your visit  or need to cancel or reschedule your appointment, please contact [facility] [place] [facility]  [phone] and follow the prompts.  Office hours are 8:00 a.m. to 4:30 p.m. Monday - Friday. Please note that voicemails left after 4:00 p.m. may not be returned until the following business day.  We are closed weekends and major holidays. You have access to a nurse at all times for urgent questions. Please call the main number to the clinic [phone] and follow the prompts.  For any non-urgent questions, you may also contact your provider using MyChart. We now offer e-Visits for anyone 18 and older to request care online for non-urgent symptoms. For details visit [email or web address].   Also download the MyChart app! Go to the app store, search "MyChart", open the app, select [facility], and log in with your MyChart username and password.    

## 2022-12-12 ENCOUNTER — Inpatient Hospital Stay: Payer: Medicare HMO

## 2022-12-12 ENCOUNTER — Ambulatory Visit
Admission: RE | Admit: 2022-12-12 | Discharge: 2022-12-12 | Disposition: A | Discharge status: Home / Self Care | Payer: Medicare HMO | Source: Ambulatory Visit | Attending: Radiation Oncology | Admitting: Radiation Oncology

## 2022-12-12 ENCOUNTER — Other Ambulatory Visit: Payer: Self-pay

## 2022-12-12 DIAGNOSIS — Z510 Encounter for antineoplastic radiation therapy: Secondary | ICD-10-CM | POA: Diagnosis not present

## 2022-12-12 DIAGNOSIS — C3432 Malignant neoplasm of lower lobe, left bronchus or lung: Secondary | ICD-10-CM | POA: Diagnosis not present

## 2022-12-12 DIAGNOSIS — F17210 Nicotine dependence, cigarettes, uncomplicated: Secondary | ICD-10-CM | POA: Diagnosis not present

## 2022-12-12 LAB — RAD ONC ARIA SESSION SUMMARY
Course Elapsed Days: 18
Plan Fractions Treated to Date: 15
Plan Prescribed Dose Per Fraction: 2 Gy
Plan Total Fractions Prescribed: 35
Plan Total Prescribed Dose: 70 Gy
Reference Point Dosage Given to Date: 30 Gy
Reference Point Session Dosage Given: 2 Gy
Session Number: 15

## 2022-12-12 NOTE — Progress Notes (Signed)
Enrolled patient into the [employer] and the [employer] 

## 2022-12-15 ENCOUNTER — Ambulatory Visit
Admission: RE | Admit: 2022-12-15 | Discharge: 2022-12-15 | Disposition: A | Discharge status: Home / Self Care | Payer: Medicare HMO | Source: Ambulatory Visit | Attending: Radiation Oncology | Admitting: Radiation Oncology

## 2022-12-15 ENCOUNTER — Encounter: Payer: Self-pay | Admitting: *Deleted

## 2022-12-15 ENCOUNTER — Other Ambulatory Visit: Payer: Self-pay

## 2022-12-15 ENCOUNTER — Inpatient Hospital Stay: Payer: Medicare HMO

## 2022-12-15 DIAGNOSIS — C3432 Malignant neoplasm of lower lobe, left bronchus or lung: Secondary | ICD-10-CM | POA: Diagnosis not present

## 2022-12-15 DIAGNOSIS — Z510 Encounter for antineoplastic radiation therapy: Secondary | ICD-10-CM | POA: Diagnosis not present

## 2022-12-15 DIAGNOSIS — F17210 Nicotine dependence, cigarettes, uncomplicated: Secondary | ICD-10-CM | POA: Diagnosis not present

## 2022-12-15 LAB — RAD ONC ARIA SESSION SUMMARY
Course Elapsed Days: 21
Plan Fractions Treated to Date: 16
Plan Prescribed Dose Per Fraction: 2 Gy
Plan Total Fractions Prescribed: 35
Plan Total Prescribed Dose: 70 Gy
Reference Point Dosage Given to Date: 32 Gy
Reference Point Session Dosage Given: 2 Gy
Session Number: 16

## 2022-12-16 ENCOUNTER — Other Ambulatory Visit: Payer: Self-pay

## 2022-12-16 ENCOUNTER — Inpatient Hospital Stay: Payer: Medicare HMO

## 2022-12-16 ENCOUNTER — Ambulatory Visit
Admission: RE | Admit: 2022-12-16 | Discharge: 2022-12-16 | Disposition: A | Discharge status: Home / Self Care | Payer: Medicare HMO | Source: Ambulatory Visit | Attending: Radiation Oncology | Admitting: Radiation Oncology

## 2022-12-16 ENCOUNTER — Encounter: Payer: Self-pay | Admitting: *Deleted

## 2022-12-16 DIAGNOSIS — F17210 Nicotine dependence, cigarettes, uncomplicated: Secondary | ICD-10-CM | POA: Diagnosis not present

## 2022-12-16 DIAGNOSIS — C3432 Malignant neoplasm of lower lobe, left bronchus or lung: Secondary | ICD-10-CM | POA: Diagnosis not present

## 2022-12-16 DIAGNOSIS — Z510 Encounter for antineoplastic radiation therapy: Secondary | ICD-10-CM | POA: Diagnosis not present

## 2022-12-16 LAB — RAD ONC ARIA SESSION SUMMARY
Course Elapsed Days: 22
Plan Fractions Treated to Date: 17
Plan Prescribed Dose Per Fraction: 2 Gy
Plan Total Fractions Prescribed: 35
Plan Total Prescribed Dose: 70 Gy
Reference Point Dosage Given to Date: 34 Gy
Reference Point Session Dosage Given: 2 Gy
Session Number: 17

## 2022-12-17 ENCOUNTER — Other Ambulatory Visit: Payer: Self-pay | Admitting: *Deleted

## 2022-12-17 ENCOUNTER — Ambulatory Visit
Admission: RE | Admit: 2022-12-17 | Discharge: 2022-12-17 | Disposition: A | Discharge status: Home / Self Care | Payer: Medicare PPO | Source: Ambulatory Visit | Attending: Radiation Oncology | Admitting: Radiation Oncology

## 2022-12-17 ENCOUNTER — Other Ambulatory Visit: Payer: Self-pay

## 2022-12-17 ENCOUNTER — Encounter: Payer: Self-pay | Admitting: Oncology

## 2022-12-17 ENCOUNTER — Encounter: Payer: Self-pay | Admitting: *Deleted

## 2022-12-17 ENCOUNTER — Inpatient Hospital Stay: Payer: Medicare PPO | Attending: Oncology

## 2022-12-17 DIAGNOSIS — F17210 Nicotine dependence, cigarettes, uncomplicated: Secondary | ICD-10-CM | POA: Diagnosis not present

## 2022-12-17 DIAGNOSIS — C3432 Malignant neoplasm of lower lobe, left bronchus or lung: Secondary | ICD-10-CM | POA: Insufficient documentation

## 2022-12-17 DIAGNOSIS — E871 Hypo-osmolality and hyponatremia: Secondary | ICD-10-CM | POA: Diagnosis not present

## 2022-12-17 DIAGNOSIS — Z510 Encounter for antineoplastic radiation therapy: Secondary | ICD-10-CM | POA: Insufficient documentation

## 2022-12-17 DIAGNOSIS — D649 Anemia, unspecified: Secondary | ICD-10-CM | POA: Diagnosis not present

## 2022-12-17 DIAGNOSIS — Z79899 Other long term (current) drug therapy: Secondary | ICD-10-CM | POA: Diagnosis not present

## 2022-12-17 DIAGNOSIS — D72819 Decreased white blood cell count, unspecified: Secondary | ICD-10-CM | POA: Diagnosis not present

## 2022-12-17 DIAGNOSIS — Z5111 Encounter for antineoplastic chemotherapy: Secondary | ICD-10-CM | POA: Diagnosis present

## 2022-12-17 LAB — RAD ONC ARIA SESSION SUMMARY
Course Elapsed Days: 23
Plan Fractions Treated to Date: 18
Plan Prescribed Dose Per Fraction: 2 Gy
Plan Total Fractions Prescribed: 35
Plan Total Prescribed Dose: 70 Gy
Reference Point Dosage Given to Date: 36 Gy
Reference Point Session Dosage Given: 2 Gy
Session Number: 18

## 2022-12-17 MED ORDER — HYDROCODONE-ACETAMINOPHEN 5-325 MG PO TABS: 1.0000 | ORAL_TABLET | Freq: Every day | ORAL | 0 refills | Status: DC | PRN

## 2022-12-17 MED FILL — Dexamethasone Sodium Phosphate Inj 100 MG/10ML: INTRAMUSCULAR | Qty: 1 | Status: AC

## 2022-12-18 ENCOUNTER — Encounter: Payer: Self-pay | Admitting: Oncology

## 2022-12-18 ENCOUNTER — Encounter: Payer: Self-pay | Admitting: *Deleted

## 2022-12-18 ENCOUNTER — Ambulatory Visit
Admission: RE | Admit: 2022-12-18 | Discharge: 2022-12-18 | Disposition: A | Discharge status: Home / Self Care | Payer: Medicare PPO | Source: Ambulatory Visit | Attending: Radiation Oncology | Admitting: Radiation Oncology

## 2022-12-18 ENCOUNTER — Inpatient Hospital Stay: Payer: Medicare PPO

## 2022-12-18 ENCOUNTER — Other Ambulatory Visit: Payer: Self-pay | Admitting: Oncology

## 2022-12-18 ENCOUNTER — Other Ambulatory Visit: Payer: Self-pay

## 2022-12-18 ENCOUNTER — Inpatient Hospital Stay (HOSPITAL_BASED_OUTPATIENT_CLINIC_OR_DEPARTMENT_OTHER): Payer: Medicare PPO | Admitting: Oncology

## 2022-12-18 VITALS — BP 147/85 | HR 96

## 2022-12-18 DIAGNOSIS — C3432 Malignant neoplasm of lower lobe, left bronchus or lung: Secondary | ICD-10-CM

## 2022-12-18 DIAGNOSIS — Z510 Encounter for antineoplastic radiation therapy: Secondary | ICD-10-CM | POA: Diagnosis not present

## 2022-12-18 LAB — RAD ONC ARIA SESSION SUMMARY
Course Elapsed Days: 24
Plan Fractions Treated to Date: 19
Plan Prescribed Dose Per Fraction: 2 Gy
Plan Total Fractions Prescribed: 35
Plan Total Prescribed Dose: 70 Gy
Reference Point Dosage Given to Date: 38 Gy
Reference Point Session Dosage Given: 2 Gy
Session Number: 19

## 2022-12-18 LAB — CBC WITH DIFFERENTIAL (CANCER CENTER ONLY)
Abs Immature Granulocytes: 0.01 10*3/uL (ref 0.00–0.07)
Basophils Absolute: 0.1 10*3/uL (ref 0.0–0.1)
Basophils Relative: 2 %
Eosinophils Absolute: 0 10*3/uL (ref 0.0–0.5)
Eosinophils Relative: 0 %
HCT: 38.2 % — ABNORMAL LOW (ref 39.0–52.0)
Hemoglobin: 13.1 g/dL (ref 13.0–17.0)
Immature Granulocytes: 0 %
Lymphocytes Relative: 5 %
Lymphs Abs: 0.2 10*3/uL — ABNORMAL LOW (ref 0.7–4.0)
MCH: 31.3 pg (ref 26.0–34.0)
MCHC: 34.3 g/dL (ref 30.0–36.0)
MCV: 91.4 fL (ref 80.0–100.0)
Monocytes Absolute: 0.3 10*3/uL (ref 0.1–1.0)
Monocytes Relative: 7 %
Neutro Abs: 3.5 10*3/uL (ref 1.7–7.7)
Neutrophils Relative %: 86 %
Platelet Count: 99 10*3/uL — ABNORMAL LOW (ref 150–400)
RBC: 4.18 MIL/uL — ABNORMAL LOW (ref 4.22–5.81)
RDW: 14.8 % (ref 11.5–15.5)
WBC Count: 4 10*3/uL (ref 4.0–10.5)
nRBC: 0 % (ref 0.0–0.2)

## 2022-12-18 LAB — CMP (CANCER CENTER ONLY)
ALT: 14 U/L (ref 0–44)
AST: 19 U/L (ref 15–41)
Albumin: 3.7 g/dL (ref 3.5–5.0)
Alkaline Phosphatase: 60 U/L (ref 38–126)
Anion gap: 10 (ref 5–15)
BUN: 7 mg/dL — ABNORMAL LOW (ref 8–23)
CO2: 25 mmol/L (ref 22–32)
Calcium: 8.9 mg/dL (ref 8.9–10.3)
Chloride: 99 mmol/L (ref 98–111)
Creatinine: 0.66 mg/dL (ref 0.61–1.24)
GFR, Estimated: >60 mL/min (ref >60)
Glucose, Bld: 111 mg/dL — ABNORMAL HIGH (ref 70–99)
Potassium: 3.7 mmol/L (ref 3.5–5.1)
Sodium: 134 mmol/L — ABNORMAL LOW (ref 135–145)
Total Bilirubin: 0.6 mg/dL (ref 0.3–1.2)
Total Protein: 7.8 g/dL (ref 6.5–8.1)

## 2022-12-18 MED ORDER — SODIUM CHLORIDE 0.9 % IV SOLN
10.0000 mg | Freq: Once | INTRAVENOUS | Status: AC
  Administered 2022-12-18: 10 mg via INTRAVENOUS
  Filled 2022-12-18: qty 10

## 2022-12-18 MED ORDER — SODIUM CHLORIDE 0.9 % IV SOLN
45.0000 mg/m2 | Freq: Once | INTRAVENOUS | Status: AC
  Administered 2022-12-18: 90 mg via INTRAVENOUS
  Filled 2022-12-18: qty 15

## 2022-12-18 MED ORDER — HEPARIN SOD (PORK) LOCK FLUSH 100 UNIT/ML IV SOLN
500.0000 [IU] | Freq: Once | INTRAVENOUS | Status: AC | PRN
  Administered 2022-12-18: 500 [IU]
  Filled 2022-12-18: qty 5

## 2022-12-18 MED ORDER — DIPHENHYDRAMINE HCL 50 MG/ML IJ SOLN
25.0000 mg | Freq: Once | INTRAMUSCULAR | Status: AC
  Administered 2022-12-18: 25 mg via INTRAVENOUS
  Filled 2022-12-18: qty 1

## 2022-12-18 MED ORDER — FAMOTIDINE IN NACL 20-0.9 MG/50ML-% IV SOLN
20.0000 mg | Freq: Once | INTRAVENOUS | Status: AC
  Administered 2022-12-18: 20 mg via INTRAVENOUS
  Filled 2022-12-18: qty 50

## 2022-12-18 MED ORDER — HYDROCODONE-ACETAMINOPHEN 5-325 MG PO TABS: 1.0000 | ORAL_TABLET | Freq: Three times a day (TID) | ORAL | 0 refills | Status: DC | PRN

## 2022-12-18 MED ORDER — PALONOSETRON HCL INJECTION 0.25 MG/5ML
0.2500 mg | Freq: Once | INTRAVENOUS | Status: AC
  Administered 2022-12-18: 0.25 mg via INTRAVENOUS
  Filled 2022-12-18: qty 5

## 2022-12-18 MED ORDER — SODIUM CHLORIDE 0.9 % IV SOLN
Freq: Once | INTRAVENOUS | Status: AC
  Filled 2022-12-18: qty 250

## 2022-12-18 MED ORDER — SODIUM CHLORIDE 0.9 % IV SOLN
272.8000 mg | Freq: Once | INTRAVENOUS | Status: AC
  Administered 2022-12-18: 270 mg via INTRAVENOUS
  Filled 2022-12-18: qty 27

## 2022-12-18 NOTE — Patient Instructions (Signed)
[facility] [place] [facility]  Discharge Instructions: Thank you for choosing [facility] to provide your oncology and hematology care.  If you have a lab appointment with the Cancer Center, please go directly to the Cancer Center and check in at the registration area.  Wear comfortable clothing and clothing appropriate for easy access to any Portacath or PICC line.   We strive to give you quality time with your provider. You may need to reschedule your appointment if you arrive late (15 or more minutes).  Arriving late affects you and other patients whose appointments are after yours.  Also, if you miss three or more appointments without notifying the office, you may be dismissed from the clinic at the provider's discretion.      For prescription refill requests, have your pharmacy contact our office and allow 72 hours for refills to be completed.    Today you received the following chemotherapy and/or immunotherapy agents Taxol and Carboplatin        To help prevent nausea and vomiting after your treatment, we encourage you to take your nausea medication as directed.  BELOW ARE SYMPTOMS THAT SHOULD BE REPORTED IMMEDIATELY: *FEVER GREATER THAN 100.4 F (38 C) OR HIGHER *CHILLS OR SWEATING *NAUSEA AND VOMITING THAT IS NOT CONTROLLED WITH YOUR NAUSEA MEDICATION *UNUSUAL SHORTNESS OF BREATH *UNUSUAL BRUISING OR BLEEDING *URINARY PROBLEMS (pain or burning when urinating, or frequent urination) *BOWEL PROBLEMS (unusual diarrhea, constipation, pain near the anus) TENDERNESS IN MOUTH AND THROAT WITH OR WITHOUT PRESENCE OF ULCERS (sore throat, sores in mouth, or a toothache) UNUSUAL RASH, SWELLING OR PAIN  UNUSUAL VAGINAL DISCHARGE OR ITCHING   Items with * indicate a potential emergency and should be followed up as soon as possible or go to the Emergency Department if any problems should occur.  Please show the CHEMOTHERAPY ALERT CARD or IMMUNOTHERAPY ALERT CARD  at check-in to the Emergency Department and triage nurse.  Should you have questions after your visit or need to cancel or reschedule your appointment, please contact [facility] [facility]  [phone] and follow the prompts.  Office hours are 8:00 a.m. to 4:30 p.m. Monday - Friday. Please note that voicemails left after 4:00 p.m. may not be returned until the following business day.  We are closed weekends and major holidays. You have access to a nurse at all times for urgent questions. Please call the main number to the clinic [phone] and follow the prompts.  For any non-urgent questions, you may also contact your provider using MyChart. We now offer e-Visits for anyone 18 and older to request care online for non-urgent symptoms. For details visit [email or web address].   Also download the MyChart app! Go to the app store, search "MyChart", open the app, select [facility], and log in with your MyChart username and password.    

## 2022-12-18 NOTE — Progress Notes (Signed)
Pt. Is having a lot of pain in his left hip on scale he rates his pain at a 10. Plus pt. Wants to see what the spots are that have recently popped up on both of his arms.

## 2022-12-18 NOTE — Progress Notes (Signed)
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  INTERVAL HISTORY: Patient returns to clinic today for further evaluation and consideration of cycle 5 of weekly carboplatin and Taxol along with his daily XRT.  He continues to have chronic hip pain, but otherwise feels well.  He is tolerating his treatments without significant side effects.  He has no neurologic complaints.  He denies any recent fevers or illnesses.  He has a good appetite and denies weight loss.  He denies any chest pain, shortness of breath, cough, or hemoptysis.  He denies any nausea, vomiting, constipation, or diarrhea.  He has no urinary complaints.  Patient offers no further specific complaints today.  REVIEW OF SYSTEMS:   Review of Systems  Constitutional: Negative.  Negative for fever, malaise/fatigue and weight loss.  Respiratory: Negative.  Negative for cough, hemoptysis and shortness of breath.   Cardiovascular: Negative.  Negative for chest pain and leg swelling.  Gastrointestinal: Negative.  Negative for abdominal pain.  Genitourinary: Negative.  Negative for dysuria.  Musculoskeletal:  Positive for joint pain. Negative for back pain.  Skin: Negative.  Negative for rash.  Neurological: Negative.  Negative for dizziness, focal weakness, weakness and headaches.  Psychiatric/Behavioral: Negative.  The patient is not nervous/anxious.     As per HPI. Otherwise, a  complete review of systems is negative.  PAST MEDICAL HISTORY: Past Medical History:  Diagnosis Date   Acute encephalopathy    Acute hypoxic respiratory failure (HCC)    Alcohol abuse    Allergy    Seasonal   Anemia    Ataxia    BPH associated with nocturia    Brain tumor (benign) (HCC)    Carpal tunnel syndrome    bilateral   COPD (chronic obstructive pulmonary disease) (HCC)    Depression    Diabetes mellitus without complication (HCC)    Dyspnea    Headache    Hypertension    Mass of lower lobe of left lung    Meningioma (HCC)    Moderate cognitive impairment    Neuropathy    Both legs   Seizures (HCC)    last seizure was in January   SVT (supraventricular tachycardia) 2018   during intubation in ED    PAST SURGICAL HISTORY: Past Surgical History:  Procedure Laterality Date   CRANIOTOMY Left 11/06/2016   Procedure: LEFT Temporal CRANIOTOMY FOR TUMOR;  Surgeon: Coletta Memos, MD;  Location: [facility] OR;  Service: Neurosurgery;  Laterality: Left;  LEFT Temproal CRANIOTOMY FOR TUMOR    CRANIOTOMY Right 01/20/2020   Procedure: Right frontal craniotomy for tumor resection;  Surgeon: Coletta Memos, MD;  Location: [facility] OR;  Service: Neurosurgery;  Laterality: Right;   HERNIA REPAIR     UMBICIAL   IR IMAGING GUIDED PORT INSERTION  11/18/2022   VASECTOMY     VIDEO BRONCHOSCOPY WITH ENDOBRONCHIAL ULTRASOUND N/A 11/03/2022   Procedure: VIDEO BRONCHOSCOPY WITH ENDOBRONCHIAL ULTRASOUND;  Surgeon: Vida Rigger, MD;  Location: [facility] ORS;  Service: Thoracic;  Laterality: N/A;    FAMILY HISTORY:  Family History  Problem Relation Age of Onset   Cancer Sister        Breast    ADVANCED DIRECTIVES (Y/N):  N  HEALTH MAINTENANCE: Social History   Tobacco Use   Smoking status: Every Day    Packs/day: 2.00    Years: 43.00    Additional pack years: 0.00    Total pack years: 86.00    Types: Cigarettes   Smokeless tobacco: Never  Vaping Use   Vaping Use: Never used  Substance Use  Topics   Alcohol use: Yes    Comment: 7 beers daily   Drug use: Yes    Types: Marijuana    Comment: everyday     Colonoscopy:  PAP:  Bone density:  Lipid panel:  Allergies  Allergen Reactions   Keppra [Levetiracetam] Anxiety and Other (See Comments)    Severe mood changes   Chantix [Varenicline] Hives   Wasp Venom Swelling   Penicillins Rash    Has patient had a PCN reaction causing immediate rash, facial/tongue/throat swelling, SOB or lightheadedness with hypotension:YES Has patient had a PCN reaction causing severe rash involving mucus membranes or skin necrosis: NO Has patient had a PCN reaction that required hospitalization NO Has patient had a PCN reaction occurring within the last 10 years: NO If all of the above answers are "NO", then may proceed with Cephalosporin use.    Current Outpatient Medications  Medication Sig Dispense Refill   acetaminophen (TYLENOL) 500 MG tablet Take 1,000 mg by mouth every 6 (six) hours as needed.     apixaban (ELIQUIS) 5 MG TABS tablet Take 1 tablet (5 mg total) by mouth 2 (two) times daily. 60 tablet 1   Cholecalciferol (VITAMIN D-1000 MAX ST) 25 MCG (1000 UT) tablet Take by mouth.     gabapentin (NEURONTIN) 400 MG capsule Take 400 mg by mouth 3 (three) times daily.     HYDROcodone-acetaminophen (NORCO) 5-325 MG tablet Take 1 tablet by mouth daily as needed for moderate pain. 60 tablet 0   lamoTRIgine (LAMICTAL) 100 MG tablet Take 1 tablet (100 mg total) by mouth 2 (two) times daily. 60 tablet 5   lidocaine-prilocaine (EMLA) cream Apply to affected area once 30 g 3   metoprolol succinate (TOPROL XL) 25 MG 24 hr tablet Take 1 tablet (25 mg total) by mouth daily. 90 tablet 0   nicotine (NICOTROL) 10 MG inhaler Inhale 1 continuous puffing into the lungs as needed for smoking cessation.     ondansetron (ZOFRAN) 8 MG tablet Take 1 tablet (8 mg total) by mouth every 8 (eight) hours as needed for nausea or vomiting. Start on the third day after  chemotherapy. 60 tablet 1   ondansetron (ZOFRAN) 8 MG tablet Take by mouth.     predniSONE (DELTASONE) 10 MG tablet Take 10 mg by mouth daily.     prochlorperazine (COMPAZINE) 10 MG tablet TAKE 1 TABLET(10 MG) BY MOUTH EVERY 6 HOURS AS NEEDED FOR NAUSEA OR VOMITING 60 tablet 1   sulfamethoxazole-trimethoprim (BACTRIM) 400-80 MG tablet Take 1 tablet by mouth 3 (three) times a week.     cholecalciferol (VITAMIN D3) 25 MCG (1000 UNIT) tablet Take 1,000 Units by mouth daily. (Patient not taking: Reported on 12/04/2022)     Fluticasone-Umeclidin-Vilant (TRELEGY ELLIPTA) 100-62.5-25 MCG/ACT AEPB Inhale 1 puff into the lungs every morning. (Patient not taking: Reported on 12/11/2022)     Fluticasone-Umeclidin-Vilant (TRELEGY ELLIPTA) 100-62.5-25 MCG/ACT AEPB Inhale into the lungs. (Patient not taking: Reported on 12/03/2022)  gabapentin (NEURONTIN) 300 MG capsule Take 1 capsule (300 mg) by mouth once a day for 1 week, then 1 capsule (300 mg) by mouth two times a day for 1 week, then 1 capsule (300 mg) by mouth three times a day. (Patient not taking: Reported on 12/03/2022) 120 capsule 5   Multiple Vitamin (MULTI-VITAMIN) tablet Take 1 tablet by mouth daily. (Patient not taking: Reported on 12/04/2022)     nystatin (MYCOSTATIN) 100000 UNIT/ML suspension Take 5 mLs by mouth 4 (four) times daily. (Patient not taking: Reported on 12/03/2022)     traMADol (ULTRAM) 50 MG tablet Take 1 tablet (50 mg total) by mouth every 6 (six) hours as needed. (Patient not taking: Reported on 12/11/2022) 30 tablet 0   No current facility-administered medications for this visit.   Facility-Administered Medications Ordered in Other Visits  Medication Dose Route Frequency Provider Last Rate Last Admin   0.9 %  sodium chloride infusion   Intravenous Once Orlie Dakin, Tollie Pizza, MD       CARBOplatin (PARAPLATIN) 270 mg in sodium chloride 0.9 % 100 mL chemo infusion  270 mg Intravenous Once Jeralyn Ruths, MD       dexamethasone  (DECADRON) 10 mg in sodium chloride 0.9 % 50 mL IVPB  10 mg Intravenous Once Jeralyn Ruths, MD       diphenhydrAMINE (BENADRYL) injection 25 mg  25 mg Intravenous Once Jeralyn Ruths, MD       famotidine (PEPCID) IVPB 20 mg premix  20 mg Intravenous Once Jeralyn Ruths, MD       heparin lock flush 100 unit/mL  500 Units Intracatheter Once PRN Jeralyn Ruths, MD       PACLitaxel (TAXOL) 90 mg in sodium chloride 0.9 % 250 mL chemo infusion (</= 80mg /m2)  45 mg/m2 (Treatment Plan Recorded) Intravenous Once Jeralyn Ruths, MD       palonosetron (ALOXI) injection 0.25 mg  0.25 mg Intravenous Once Jeralyn Ruths, MD        OBJECTIVE: Vitals:   12/18/22 0931 12/18/22 0932  BP: (!) 153/107 (!) 158/100  Pulse: (!) 113   Temp: (!) 96 F (35.6 C)   SpO2: 98%      Body mass index is 22.89 kg/m.    ECOG FS:0 - Asymptomatic  General: Well-developed, well-nourished, no acute distress. Eyes: Pink conjunctiva, anicteric sclera. HEENT: Normocephalic, moist mucous membranes. Lungs: No audible wheezing or coughing. Heart: Regular rate and rhythm. Abdomen: Soft, nontender, no obvious distention. Musculoskeletal: No edema, cyanosis, or clubbing. Neuro: Alert, answering all questions appropriately. Cranial nerves grossly intact. Skin: No rashes or petechiae noted. Psych: Normal affect.  LAB RESULTS:  Lab Results  Component Value Date   NA 134 (L) 12/18/2022   K 3.7 12/18/2022   CL 99 12/18/2022   CO2 25 12/18/2022   GLUCOSE 111 (H) 12/18/2022   BUN 7 (L) 12/18/2022   CREATININE 0.66 12/18/2022   CALCIUM 8.9 12/18/2022   PROT 7.8 12/18/2022   ALBUMIN 3.7 12/18/2022   AST 19 12/18/2022   ALT 14 12/18/2022   ALKPHOS 60 12/18/2022   BILITOT 0.6 12/18/2022   GFRNONAA >60 12/18/2022   GFRAA >60 01/18/2020    Lab Results  Component Value Date   WBC 4.0 12/18/2022   NEUTROABS 3.5 12/18/2022   HGB 13.1 12/18/2022   HCT 38.2 (L) 12/18/2022   MCV 91.4 12/18/2022    PLT 99 (L) 12/18/2022     STUDIES: IR IMAGING GUIDED PORT INSERTION  Result Date: 11/18/2022 INDICATION: 62 year old male referred for port catheter EXAM: IMAGE GUIDED PORT CATHETER MEDICATIONS: None ANESTHESIA/SEDATION: Moderate (conscious) sedation was employed during this procedure. A total of Versed 2.0 mg and Fentanyl 100 mcg was administered intravenously. Moderate Sedation Time: 22 minutes. The patient's level of consciousness and vital signs were monitored continuously by radiology nursing throughout the procedure under my direct supervision. FLUOROSCOPY TIME:  Fluoroscopy Time:   (1 mGy). COMPLICATIONS: None PROCEDURE: Informed written consent was obtained from the patient after a thorough discussion of the procedural risks, benefits and alternatives. All questions were addressed. Maximal Sterile Barrier Technique was utilized including caps, mask, sterile gowns, sterile gloves, sterile drape, hand hygiene and skin antiseptic. A timeout was performed prior to the initiation of the procedure. Ultrasound survey was performed with images stored and sent to PACs. Right IJ vein documented to be patent. The right neck and chest was prepped with chlorhexidine, and draped in the usual sterile fashion using maximum barrier technique (cap and mask, sterile gown, sterile gloves, large sterile sheet, hand hygiene and cutaneous antiseptic). Local anesthesia was attained by infiltration with 1% lidocaine without epinephrine. Ultrasound demonstrated patency of the right internal jugular vein, and this was documented with an image. Under real-time ultrasound guidance, this vein was accessed with a 21 gauge micropuncture needle and image documentation was performed. A small dermatotomy was made at the access site with an 11 scalpel. A 0.018" wire was advanced into the SVC and used to estimate the length of the internal catheter. The access needle exchanged for a 71F micropuncture vascular sheath. The 0.018" wire was  then removed and a 0.035" wire advanced into the IVC. An appropriate location for the subcutaneous reservoir was selected below the clavicle and an incision was made through the skin and underlying soft tissues. The subcutaneous tissues were then dissected using a combination of blunt and sharp surgical technique and a pocket was formed. A single lumen power injectable portacatheter was then tunneled through the subcutaneous tissues from the pocket to the dermatotomy and the port reservoir placed within the subcutaneous pocket. The venous access site was then serially dilated and a peel away vascular sheath placed over the wire. The wire was removed and the port catheter advanced into position under fluoroscopic guidance. The catheter tip is positioned in the cavoatrial junction. This was documented with a spot image. The portacatheter was then tested and found to flush and aspirate well. The port was flushed with saline followed by 100 units/mL heparinized saline. The pocket was then closed in two layers using first subdermal inverted interrupted absorbable sutures followed by a running subcuticular suture. The epidermis was then sealed with Dermabond. Steri-Strips applied. The dermatotomy at the venous access site was also seal with Dermabond and Steri-Strips. Patient tolerated the procedure well and remained hemodynamically stable throughout. No complications encountered and no significant blood loss encountered IMPRESSION: Status post right IJ port catheter. Signed, Frederick Drake. Miachel Roux, RPVI [facility] Electronically Signed   By: Gilmer Mor D.O.   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  PET scan results from September 11, 2022 reviewed independently confirming  stage of disease.  MRI of the brain on September 22, 2022 did not reveal any metastatic disease. He will benefit  from concurrent chemotherapy with weekly carboplatin and Taxol with 5-8 cycles along with daily XRT.  Once he completes his concurrent treatment, he will benefit from maintenance durvalumab every 2 weeks for an entire year.  Patient has had port placement.  Proceed with cycle 5 of treatment today.  Continue daily XRT.  Return to clinic in 1 week for further evaluation and consideration of cycle 6.   Hyponatremia: Chronic and unchanged.  Patient sodium is 134. Anemia: Resolved. Joint pain: Likely secondary to arthritis and unrelated to malignancy.  Continue Norco as prescribed.  Patient was given a referral to orthopedics. Thrombocytopenia: Mild.  Proceed with treatment as above.  Patient expressed understanding and was in agreement with this plan. He also understands that He can call clinic at any time with any questions, concerns, or complaints.    Cancer Staging  Squamous cell carcinoma of lower lobe of left lung [facility]) Staging form: Lung, AJCC 8th Edition - Clinical stage from 11/11/2022: Stage IIIA (cT4, cN1, cM0) - Signed by Jeralyn Ruths, MD on 11/11/2022 Stage prefix: Initial diagnosis   Jeralyn Ruths, MD   12/18/2022 9:50 AM

## 2022-12-19 ENCOUNTER — Ambulatory Visit
Admission: RE | Admit: 2022-12-19 | Discharge: 2022-12-19 | Disposition: A | Discharge status: Home / Self Care | Payer: Medicare PPO | Source: Ambulatory Visit | Attending: Radiation Oncology | Admitting: Radiation Oncology

## 2022-12-19 ENCOUNTER — Encounter: Payer: Self-pay | Admitting: *Deleted

## 2022-12-19 ENCOUNTER — Other Ambulatory Visit: Payer: Self-pay

## 2022-12-19 ENCOUNTER — Inpatient Hospital Stay: Payer: Medicare PPO

## 2022-12-19 DIAGNOSIS — Z510 Encounter for antineoplastic radiation therapy: Secondary | ICD-10-CM | POA: Diagnosis not present

## 2022-12-19 LAB — RAD ONC ARIA SESSION SUMMARY
Course Elapsed Days: 25
Plan Fractions Treated to Date: 20
Plan Prescribed Dose Per Fraction: 2 Gy
Plan Total Fractions Prescribed: 35
Plan Total Prescribed Dose: 70 Gy
Reference Point Dosage Given to Date: 40 Gy
Reference Point Session Dosage Given: 2 Gy
Session Number: 20

## 2022-12-22 ENCOUNTER — Other Ambulatory Visit: Payer: Self-pay

## 2022-12-22 ENCOUNTER — Ambulatory Visit
Admission: RE | Admit: 2022-12-22 | Discharge: 2022-12-22 | Disposition: A | Discharge status: Home / Self Care | Payer: Medicare PPO | Source: Ambulatory Visit | Attending: Radiation Oncology | Admitting: Radiation Oncology

## 2022-12-22 ENCOUNTER — Inpatient Hospital Stay: Payer: Medicare PPO

## 2022-12-22 ENCOUNTER — Encounter: Payer: Self-pay | Admitting: *Deleted

## 2022-12-22 DIAGNOSIS — Z510 Encounter for antineoplastic radiation therapy: Secondary | ICD-10-CM | POA: Diagnosis not present

## 2022-12-22 LAB — RAD ONC ARIA SESSION SUMMARY
Course Elapsed Days: 28
Plan Fractions Treated to Date: 21
Plan Prescribed Dose Per Fraction: 2 Gy
Plan Total Fractions Prescribed: 35
Plan Total Prescribed Dose: 70 Gy
Reference Point Dosage Given to Date: 42 Gy
Reference Point Session Dosage Given: 2 Gy
Session Number: 21

## 2022-12-23 ENCOUNTER — Inpatient Hospital Stay: Payer: Medicare PPO

## 2022-12-23 ENCOUNTER — Ambulatory Visit
Admission: RE | Admit: 2022-12-23 | Discharge: 2022-12-23 | Disposition: A | Discharge status: Home / Self Care | Payer: Medicare PPO | Source: Ambulatory Visit | Attending: Radiation Oncology | Admitting: Radiation Oncology

## 2022-12-23 ENCOUNTER — Other Ambulatory Visit: Payer: Self-pay

## 2022-12-23 ENCOUNTER — Encounter: Payer: Self-pay | Admitting: *Deleted

## 2022-12-23 DIAGNOSIS — Z510 Encounter for antineoplastic radiation therapy: Secondary | ICD-10-CM | POA: Diagnosis not present

## 2022-12-23 LAB — RAD ONC ARIA SESSION SUMMARY
Course Elapsed Days: 29
Plan Fractions Treated to Date: 22
Plan Prescribed Dose Per Fraction: 2 Gy
Plan Total Fractions Prescribed: 35
Plan Total Prescribed Dose: 70 Gy
Reference Point Dosage Given to Date: 44 Gy
Reference Point Session Dosage Given: 2 Gy
Session Number: 22

## 2022-12-24 ENCOUNTER — Other Ambulatory Visit: Payer: Self-pay

## 2022-12-24 ENCOUNTER — Ambulatory Visit
Admission: RE | Admit: 2022-12-24 | Discharge: 2022-12-24 | Disposition: A | Discharge status: Home / Self Care | Payer: Medicare PPO | Source: Ambulatory Visit | Attending: Radiation Oncology | Admitting: Radiation Oncology

## 2022-12-24 ENCOUNTER — Inpatient Hospital Stay: Payer: Medicare PPO

## 2022-12-24 DIAGNOSIS — Z510 Encounter for antineoplastic radiation therapy: Secondary | ICD-10-CM | POA: Diagnosis not present

## 2022-12-24 LAB — RAD ONC ARIA SESSION SUMMARY
Course Elapsed Days: 30
Plan Fractions Treated to Date: 23
Plan Prescribed Dose Per Fraction: 2 Gy
Plan Total Fractions Prescribed: 35
Plan Total Prescribed Dose: 70 Gy
Reference Point Dosage Given to Date: 46 Gy
Reference Point Session Dosage Given: 2 Gy
Session Number: 23

## 2022-12-25 ENCOUNTER — Encounter: Payer: Self-pay | Admitting: Oncology

## 2022-12-25 ENCOUNTER — Other Ambulatory Visit: Payer: Self-pay

## 2022-12-25 ENCOUNTER — Ambulatory Visit
Admission: RE | Admit: 2022-12-25 | Discharge: 2022-12-25 | Disposition: A | Discharge status: Home / Self Care | Payer: Medicare PPO | Source: Ambulatory Visit | Attending: Radiation Oncology | Admitting: Radiation Oncology

## 2022-12-25 ENCOUNTER — Inpatient Hospital Stay: Payer: Medicare PPO

## 2022-12-25 ENCOUNTER — Encounter: Payer: Self-pay | Admitting: *Deleted

## 2022-12-25 ENCOUNTER — Inpatient Hospital Stay: Payer: Medicare PPO | Admitting: Oncology

## 2022-12-25 VITALS — BP 123/88 | HR 78 | Temp 96.9°F | Resp 16 | Ht 73.0 in | Wt 178.0 lb

## 2022-12-25 DIAGNOSIS — Z510 Encounter for antineoplastic radiation therapy: Secondary | ICD-10-CM | POA: Diagnosis not present

## 2022-12-25 DIAGNOSIS — C3432 Malignant neoplasm of lower lobe, left bronchus or lung: Secondary | ICD-10-CM

## 2022-12-25 LAB — RAD ONC ARIA SESSION SUMMARY
Course Elapsed Days: 31
Plan Fractions Treated to Date: 24
Plan Prescribed Dose Per Fraction: 2 Gy
Plan Total Fractions Prescribed: 35
Plan Total Prescribed Dose: 70 Gy
Reference Point Dosage Given to Date: 48 Gy
Reference Point Session Dosage Given: 2 Gy
Session Number: 24

## 2022-12-25 LAB — CBC WITH DIFFERENTIAL (CANCER CENTER ONLY)
Abs Immature Granulocytes: 0.01 10*3/uL (ref 0.00–0.07)
Basophils Absolute: 0 10*3/uL (ref 0.0–0.1)
Basophils Relative: 1 %
Eosinophils Absolute: 0 10*3/uL (ref 0.0–0.5)
Eosinophils Relative: 0 %
HCT: 35.5 % — ABNORMAL LOW (ref 39.0–52.0)
Hemoglobin: 12 g/dL — ABNORMAL LOW (ref 13.0–17.0)
Immature Granulocytes: 0 %
Lymphocytes Relative: 6 %
Lymphs Abs: 0.2 10*3/uL — ABNORMAL LOW (ref 0.7–4.0)
MCH: 31.8 pg (ref 26.0–34.0)
MCHC: 33.8 g/dL (ref 30.0–36.0)
MCV: 94.2 fL (ref 80.0–100.0)
Monocytes Absolute: 0.2 10*3/uL (ref 0.1–1.0)
Monocytes Relative: 8 %
Neutro Abs: 2.3 10*3/uL (ref 1.7–7.7)
Neutrophils Relative %: 85 %
Platelet Count: 76 10*3/uL — ABNORMAL LOW (ref 150–400)
RBC: 3.77 MIL/uL — ABNORMAL LOW (ref 4.22–5.81)
RDW: 15.2 % (ref 11.5–15.5)
WBC Count: 2.8 10*3/uL — ABNORMAL LOW (ref 4.0–10.5)
nRBC: 0 % (ref 0.0–0.2)

## 2022-12-25 LAB — CMP (CANCER CENTER ONLY)
ALT: 13 U/L (ref 0–44)
AST: 19 U/L (ref 15–41)
Albumin: 3.3 g/dL — ABNORMAL LOW (ref 3.5–5.0)
Alkaline Phosphatase: 60 U/L (ref 38–126)
Anion gap: 7 (ref 5–15)
BUN: 9 mg/dL (ref 8–23)
CO2: 28 mmol/L (ref 22–32)
Calcium: 8.7 mg/dL — ABNORMAL LOW (ref 8.9–10.3)
Chloride: 101 mmol/L (ref 98–111)
Creatinine: 0.59 mg/dL — ABNORMAL LOW (ref 0.61–1.24)
GFR, Estimated: >60 mL/min (ref >60)
Glucose, Bld: 107 mg/dL — ABNORMAL HIGH (ref 70–99)
Potassium: 3.9 mmol/L (ref 3.5–5.1)
Sodium: 136 mmol/L (ref 135–145)
Total Bilirubin: 0.5 mg/dL (ref 0.3–1.2)
Total Protein: 7 g/dL (ref 6.5–8.1)

## 2022-12-25 NOTE — Patient Instructions (Signed)

## 2022-12-25 NOTE — Progress Notes (Signed)
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  INTERVAL HISTORY: Patient returns to clinic today for further evaluation and consideration of cycle 6 of weekly carboplatinum along with his daily XRT.  He continues to have chronic hip pain, but otherwise feels well.  He is tolerating his treatments without significant side effects.  He has no neurologic complaints.  He denies any recent fevers or illnesses.  He has a good appetite and denies weight loss.  He denies any chest pain, shortness of breath, cough, or hemoptysis.  He denies any nausea, vomiting, constipation, or diarrhea.  He has no urinary complaints.  Patient offers no further specific complaints today.  REVIEW OF SYSTEMS:   Review of Systems  Constitutional: Negative.  Negative for fever, malaise/fatigue and weight loss.  Respiratory: Negative.  Negative for cough, hemoptysis and shortness of breath.   Cardiovascular: Negative.  Negative for chest pain and leg swelling.  Gastrointestinal: Negative.  Negative for abdominal pain.  Genitourinary: Negative.  Negative for dysuria.  Musculoskeletal:  Positive for joint pain. Negative for back pain.  Skin: Negative.  Negative for rash.  Neurological: Negative.  Negative for dizziness, focal weakness, weakness and headaches.  Psychiatric/Behavioral: Negative.  The patient is not nervous/anxious.     As per HPI. Otherwise, a  complete review of systems is negative.  PAST MEDICAL HISTORY: Past Medical History:  Diagnosis Date   Acute encephalopathy    Acute hypoxic respiratory failure (HCC)    Alcohol abuse    Allergy    Seasonal   Anemia    Ataxia    BPH associated with nocturia    Brain tumor (benign) (HCC)    Carpal tunnel syndrome    bilateral   COPD (chronic obstructive pulmonary disease) (HCC)    Depression    Diabetes mellitus without complication (HCC)    Dyspnea    Headache    Hypertension    Mass of lower lobe of left lung    Meningioma (HCC)    Moderate cognitive impairment    Neuropathy    Both legs   Seizures (HCC)    last seizure was in January   SVT (supraventricular tachycardia) 2018   during intubation in ED    PAST SURGICAL HISTORY: Past Surgical History:  Procedure Laterality Date   CRANIOTOMY Left 11/06/2016   Procedure: LEFT Temporal CRANIOTOMY FOR TUMOR;  Surgeon: Coletta Memos, MD;  Location: [facility] OR;  Service: Neurosurgery;  Laterality: Left;  LEFT Temproal CRANIOTOMY FOR TUMOR    CRANIOTOMY Right 01/20/2020   Procedure: Right frontal craniotomy for tumor resection;  Surgeon: Coletta Memos, MD;  Location: [facility] OR;  Service: Neurosurgery;  Laterality: Right;   HERNIA REPAIR     UMBICIAL   IR IMAGING GUIDED PORT INSERTION  11/18/2022   VASECTOMY     VIDEO BRONCHOSCOPY WITH ENDOBRONCHIAL ULTRASOUND N/A 11/03/2022   Procedure: VIDEO BRONCHOSCOPY WITH ENDOBRONCHIAL ULTRASOUND;  Surgeon: Vida Rigger, MD;  Location: [facility] ORS;  Service: Thoracic;  Laterality: N/A;    FAMILY HISTORY: Family History  Problem Relation Age of Onset   Cancer Sister        Breast    ADVANCED DIRECTIVES (Y/N):  N  HEALTH MAINTENANCE: Social History   Tobacco Use   Smoking status: Every Day    Packs/day: 2.00    Years: 43.00    Additional pack years: 0.00    Total pack years: 86.00    Types: Cigarettes   Smokeless tobacco: Never  Vaping Use   Vaping Use: Never used  Substance Use  Topics   Alcohol use: Yes    Comment: 7 beers daily   Drug use: Yes    Types: Marijuana    Comment: everyday     Colonoscopy:  PAP:  Bone density:  Lipid panel:  Allergies  Allergen Reactions   Keppra [Levetiracetam] Anxiety and Other (See Comments)    Severe mood changes   Chantix [Varenicline] Hives   Wasp Venom Swelling   Penicillins Rash    Has patient had a PCN reaction causing immediate rash, facial/tongue/throat swelling, SOB or lightheadedness with hypotension:YES Has patient had a PCN reaction causing severe rash involving mucus membranes or skin necrosis: NO Has patient had a PCN reaction that required hospitalization NO Has patient had a PCN reaction occurring within the last 10 years: NO If all of the above answers are "NO", then may proceed with Cephalosporin use.    Current Outpatient Medications  Medication Sig Dispense Refill   acetaminophen (TYLENOL) 500 MG tablet Take 1,000 mg by mouth every 6 (six) hours as needed.     apixaban (ELIQUIS) 5 MG TABS tablet Take 1 tablet (5 mg total) by mouth 2 (two) times daily. 60 tablet 1   Cholecalciferol (VITAMIN D-1000 MAX ST) 25 MCG (1000 UT) tablet Take by mouth.     gabapentin (NEURONTIN) 400 MG capsule Take 400 mg by mouth 3 (three) times daily.     HYDROcodone-acetaminophen (NORCO) 5-325 MG tablet Take 1 tablet by mouth every 8 (eight) hours as needed for moderate pain. 60 tablet 0   lamoTRIgine (LAMICTAL) 100 MG tablet Take 1 tablet (100 mg total) by mouth 2 (two) times daily. 60 tablet 5   lidocaine-prilocaine (EMLA) cream Apply to affected area once 30 g 3   metoprolol succinate (TOPROL XL) 25 MG 24 hr tablet Take 1 tablet (25 mg total) by mouth daily. 90 tablet 0   nicotine (NICOTROL) 10 MG inhaler Inhale 1 continuous puffing into the lungs as needed for smoking cessation.     ondansetron (ZOFRAN) 8 MG tablet Take 1 tablet (8 mg total) by mouth every 8 (eight) hours as needed for nausea or vomiting. Start on the  third day after chemotherapy. 60 tablet 1   ondansetron (ZOFRAN) 8 MG tablet Take by mouth.     predniSONE (DELTASONE) 10 MG tablet Take 10 mg by mouth daily.     prochlorperazine (COMPAZINE) 10 MG tablet TAKE 1 TABLET(10 MG) BY MOUTH EVERY 6 HOURS AS NEEDED FOR NAUSEA OR VOMITING 60 tablet 1   cholecalciferol (VITAMIN D3) 25 MCG (1000 UNIT) tablet Take 1,000 Units by mouth daily. (Patient not taking: Reported on 12/04/2022)     Fluticasone-Umeclidin-Vilant (TRELEGY ELLIPTA) 100-62.5-25 MCG/ACT AEPB Inhale 1 puff into the lungs every morning. (Patient not taking: Reported on 12/11/2022)     Fluticasone-Umeclidin-Vilant (TRELEGY ELLIPTA) 100-62.5-25 MCG/ACT AEPB Inhale into the lungs. (Patient not taking: Reported on 12/03/2022)     gabapentin (NEURONTIN) 300 MG capsule Take 1 capsule (300 mg) by mouth once a day for  1 week, then 1 capsule (300 mg) by mouth two times a day for 1 week, then 1 capsule (300 mg) by mouth three times a day. (Patient not taking: Reported on 12/03/2022) 120 capsule 5   Multiple Vitamin (MULTI-VITAMIN) tablet Take 1 tablet by mouth daily. (Patient not taking: Reported on 12/04/2022)     nystatin (MYCOSTATIN) 100000 UNIT/ML suspension Take 5 mLs by mouth 4 (four) times daily. (Patient not taking: Reported on 12/03/2022)     sulfamethoxazole-trimethoprim (BACTRIM) 400-80 MG tablet Take 1 tablet by mouth 3 (three) times a week. (Patient not taking: Reported on 12/25/2022)     traMADol (ULTRAM) 50 MG tablet Take 1 tablet (50 mg total) by mouth every 6 (six) hours as needed. (Patient not taking: Reported on 12/11/2022) 30 tablet 0   No current facility-administered medications for this visit.    OBJECTIVE: Vitals:   12/25/22 1108  BP: 123/88  Pulse: 78  Resp: 16  Temp: (!) 96.9 F (36.1 C)  SpO2: 100%     Body mass index is 23.48 kg/m.    ECOG FS:0 - Asymptomatic  General: Well-developed, well-nourished, no acute distress. Eyes: Pink conjunctiva, anicteric sclera. HEENT:  Normocephalic, moist mucous membranes. Lungs: No audible wheezing or coughing. Heart: Regular rate and rhythm. Abdomen: Soft, nontender, no obvious distention. Musculoskeletal: No edema, cyanosis, or clubbing. Neuro: Alert, answering all questions appropriately. Cranial nerves grossly intact. Skin: No rashes or petechiae noted. Psych: Normal affect.  LAB RESULTS:  Lab Results  Component Value Date   NA 136 12/25/2022   K 3.9 12/25/2022   CL 101 12/25/2022   CO2 28 12/25/2022   GLUCOSE 107 (H) 12/25/2022   BUN 9 12/25/2022   CREATININE 0.59 (L) 12/25/2022   CALCIUM 8.7 (L) 12/25/2022   PROT 7.0 12/25/2022   ALBUMIN 3.3 (L) 12/25/2022   AST 19 12/25/2022   ALT 13 12/25/2022   ALKPHOS 60 12/25/2022   BILITOT 0.5 12/25/2022   GFRNONAA >60 12/25/2022   GFRAA >60 01/18/2020    Lab Results  Component Value Date   WBC 2.8 (L) 12/25/2022   NEUTROABS 2.3 12/25/2022   HGB 12.0 (L) 12/25/2022   HCT 35.5 (L) 12/25/2022   MCV 94.2 12/25/2022   PLT 76 (L) 12/25/2022     STUDIES: No results found.  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  PET scan results from September 11, 2022 reviewed independently confirming stage of disease.  MRI of the brain on September 22, 2022 did not reveal any metastatic disease. He will benefit from concurrent chemotherapy with weekly carboplatin and Taxol with 5-8 cycles along with daily XRT.  Once he completes his concurrent treatment, he will benefit from maintenance durvalumab every 2 weeks for an entire year.  Patient has had port placement.  Delay cycle 6 of treatment today secondary to thrombocytopenia.  Continue daily XRT completing treatment on October 10, 2022.  Return to clinic in 1 week for further evaluation and reconsideration of cycle 6.  Hyponatremia: Resolved. Anemia: Mild,  monitor. Thrombocytopenia: Patient's platelet count is 76 today.  Delay treatment 1 week as above. Leukopenia: Mild.  Delay treatment as above. Joint pain: Likely secondary to arthritis and unrelated to malignancy.  Continue Norco as prescribed.  Patient was given a referral to orthopedics, but he has not yet scheduled the appointment.  Patient  expressed understanding and was in agreement with this plan. He also understands that He can call clinic at any time with any questions, concerns, or complaints.    Cancer Staging  Squamous cell carcinoma of lower lobe of left lung [facility]) Staging form: Lung, AJCC 8th Edition - Clinical stage from 11/11/2022: Stage IIIA (cT4, cN1, cM0) - Signed by Jeralyn Ruths, MD on 11/11/2022 Stage prefix: Initial diagnosis   Jeralyn Ruths, MD   12/25/2022 12:50 PM

## 2022-12-26 ENCOUNTER — Other Ambulatory Visit: Payer: Self-pay | Admitting: *Deleted

## 2022-12-26 ENCOUNTER — Encounter: Payer: Self-pay | Admitting: *Deleted

## 2022-12-26 ENCOUNTER — Inpatient Hospital Stay: Payer: Medicare PPO

## 2022-12-26 ENCOUNTER — Ambulatory Visit: Admission: RE | Admit: 2022-12-26 | Payer: Medicare PPO | Source: Ambulatory Visit

## 2022-12-26 ENCOUNTER — Ambulatory Visit: Payer: Medicare PPO

## 2022-12-26 DIAGNOSIS — C3432 Malignant neoplasm of lower lobe, left bronchus or lung: Secondary | ICD-10-CM

## 2022-12-28 ENCOUNTER — Other Ambulatory Visit: Payer: Self-pay

## 2022-12-29 ENCOUNTER — Inpatient Hospital Stay: Payer: Medicare PPO

## 2022-12-29 ENCOUNTER — Ambulatory Visit: Payer: Medicare PPO

## 2022-12-30 ENCOUNTER — Inpatient Hospital Stay: Payer: Medicare PPO

## 2022-12-30 ENCOUNTER — Telehealth: Payer: Self-pay | Admitting: Radiation Oncology

## 2022-12-30 ENCOUNTER — Ambulatory Visit: Payer: Medicare PPO

## 2022-12-30 NOTE — Telephone Encounter (Signed)
THIS PATIENT'S WIFE CALLED AND LEFT MESSAGE WITH ANSWERING SERVICE TO CANCEL TODAY'S RADIATION LAB & TO CALL BACK TO RESCHEDULE. TEAM WAS NOTIFIED.

## 2022-12-31 ENCOUNTER — Ambulatory Visit: Payer: Medicare PPO | Attending: Physician Assistant

## 2022-12-31 ENCOUNTER — Other Ambulatory Visit: Payer: Self-pay | Admitting: *Deleted

## 2022-12-31 ENCOUNTER — Ambulatory Visit
Admission: RE | Admit: 2022-12-31 | Discharge: 2022-12-31 | Disposition: A | Discharge status: Home / Self Care | Payer: Medicare PPO | Source: Ambulatory Visit | Attending: Radiation Oncology | Admitting: Radiation Oncology

## 2022-12-31 ENCOUNTER — Inpatient Hospital Stay: Payer: Medicare PPO

## 2022-12-31 ENCOUNTER — Other Ambulatory Visit: Payer: Self-pay

## 2022-12-31 ENCOUNTER — Encounter: Payer: Self-pay | Admitting: *Deleted

## 2022-12-31 DIAGNOSIS — Z510 Encounter for antineoplastic radiation therapy: Secondary | ICD-10-CM | POA: Diagnosis not present

## 2022-12-31 DIAGNOSIS — C3432 Malignant neoplasm of lower lobe, left bronchus or lung: Secondary | ICD-10-CM

## 2022-12-31 DIAGNOSIS — I4892 Unspecified atrial flutter: Secondary | ICD-10-CM | POA: Diagnosis not present

## 2022-12-31 LAB — RAD ONC ARIA SESSION SUMMARY
Course Elapsed Days: 37
Plan Fractions Treated to Date: 25
Plan Prescribed Dose Per Fraction: 2 Gy
Plan Total Fractions Prescribed: 35
Plan Total Prescribed Dose: 70 Gy
Reference Point Dosage Given to Date: 50 Gy
Reference Point Session Dosage Given: 2 Gy
Session Number: 25

## 2022-12-31 LAB — ECHOCARDIOGRAM COMPLETE
AR max vel: 3.54 cm2
AV Area VTI: 3.79 cm2
AV Area mean vel: 3.45 cm2
AV Mean grad: 2 mmHg
AV Peak grad: 3.5 mmHg
Ao pk vel: 0.94 m/s
Calc EF: 41.9 %
S' Lateral: 4.4 cm
Single Plane A2C EF: 40.9 %
Single Plane A4C EF: 41.4 %

## 2022-12-31 LAB — CBC (CANCER CENTER ONLY)
HCT: 41 % (ref 39.0–52.0)
Hemoglobin: 13.7 g/dL (ref 13.0–17.0)
MCH: 31.5 pg (ref 26.0–34.0)
MCHC: 33.4 g/dL (ref 30.0–36.0)
MCV: 94.3 fL (ref 80.0–100.0)
Platelet Count: 103 10*3/uL — ABNORMAL LOW (ref 150–400)
RBC: 4.35 MIL/uL (ref 4.22–5.81)
RDW: 16.6 % — ABNORMAL HIGH (ref 11.5–15.5)
WBC Count: 2.9 10*3/uL — ABNORMAL LOW (ref 4.0–10.5)
nRBC: 0 % (ref 0.0–0.2)

## 2022-12-31 MED FILL — Dexamethasone Sodium Phosphate Inj 100 MG/10ML: INTRAMUSCULAR | Qty: 1 | Status: AC

## 2023-01-01 ENCOUNTER — Ambulatory Visit
Admission: RE | Admit: 2023-01-01 | Discharge: 2023-01-01 | Disposition: A | Discharge status: Home / Self Care | Payer: Medicare PPO | Source: Ambulatory Visit | Attending: Radiation Oncology | Admitting: Radiation Oncology

## 2023-01-01 ENCOUNTER — Encounter: Payer: Self-pay | Admitting: *Deleted

## 2023-01-01 ENCOUNTER — Other Ambulatory Visit: Payer: Self-pay

## 2023-01-01 ENCOUNTER — Inpatient Hospital Stay: Payer: Medicare PPO

## 2023-01-01 ENCOUNTER — Encounter: Payer: Self-pay | Admitting: Oncology

## 2023-01-01 ENCOUNTER — Inpatient Hospital Stay (HOSPITAL_BASED_OUTPATIENT_CLINIC_OR_DEPARTMENT_OTHER): Payer: Medicare PPO | Admitting: Oncology

## 2023-01-01 DIAGNOSIS — C3432 Malignant neoplasm of lower lobe, left bronchus or lung: Secondary | ICD-10-CM

## 2023-01-01 DIAGNOSIS — Z510 Encounter for antineoplastic radiation therapy: Secondary | ICD-10-CM | POA: Diagnosis not present

## 2023-01-01 LAB — CMP (CANCER CENTER ONLY)
ALT: 14 U/L (ref 0–44)
AST: 20 U/L (ref 15–41)
Albumin: 3.4 g/dL — ABNORMAL LOW (ref 3.5–5.0)
Alkaline Phosphatase: 69 U/L (ref 38–126)
Anion gap: 12 (ref 5–15)
BUN: 7 mg/dL — ABNORMAL LOW (ref 8–23)
CO2: 24 mmol/L (ref 22–32)
Calcium: 8.5 mg/dL — ABNORMAL LOW (ref 8.9–10.3)
Chloride: 100 mmol/L (ref 98–111)
Creatinine: 0.62 mg/dL (ref 0.61–1.24)
GFR, Estimated: >60 mL/min (ref >60)
Glucose, Bld: 92 mg/dL (ref 70–99)
Potassium: 3.6 mmol/L (ref 3.5–5.1)
Sodium: 136 mmol/L (ref 135–145)
Total Bilirubin: 0.5 mg/dL (ref 0.3–1.2)
Total Protein: 7 g/dL (ref 6.5–8.1)

## 2023-01-01 LAB — RAD ONC ARIA SESSION SUMMARY
Course Elapsed Days: 38
Plan Fractions Treated to Date: 26
Plan Prescribed Dose Per Fraction: 2 Gy
Plan Total Fractions Prescribed: 35
Plan Total Prescribed Dose: 70 Gy
Reference Point Dosage Given to Date: 52 Gy
Reference Point Session Dosage Given: 2 Gy
Session Number: 26

## 2023-01-01 LAB — CBC WITH DIFFERENTIAL (CANCER CENTER ONLY)
Abs Immature Granulocytes: 0.01 10*3/uL (ref 0.00–0.07)
Basophils Absolute: 0 10*3/uL (ref 0.0–0.1)
Basophils Relative: 1 %
Eosinophils Absolute: 0 10*3/uL (ref 0.0–0.5)
Eosinophils Relative: 0 %
HCT: 34.8 % — ABNORMAL LOW (ref 39.0–52.0)
Hemoglobin: 11.9 g/dL — ABNORMAL LOW (ref 13.0–17.0)
Immature Granulocytes: 0 %
Lymphocytes Relative: 6 %
Lymphs Abs: 0.2 10*3/uL — ABNORMAL LOW (ref 0.7–4.0)
MCH: 32.2 pg (ref 26.0–34.0)
MCHC: 34.2 g/dL (ref 30.0–36.0)
MCV: 94.1 fL (ref 80.0–100.0)
Monocytes Absolute: 0.3 10*3/uL (ref 0.1–1.0)
Monocytes Relative: 8 %
Neutro Abs: 2.7 10*3/uL (ref 1.7–7.7)
Neutrophils Relative %: 85 %
Platelet Count: 89 10*3/uL — ABNORMAL LOW (ref 150–400)
RBC: 3.7 MIL/uL — ABNORMAL LOW (ref 4.22–5.81)
RDW: 16.4 % — ABNORMAL HIGH (ref 11.5–15.5)
WBC Count: 3.2 10*3/uL — ABNORMAL LOW (ref 4.0–10.5)
nRBC: 0 % (ref 0.0–0.2)

## 2023-01-01 MED ORDER — DIPHENHYDRAMINE HCL 50 MG/ML IJ SOLN
25.0000 mg | Freq: Once | INTRAMUSCULAR | Status: AC
  Administered 2023-01-01: 25 mg via INTRAVENOUS
  Filled 2023-01-01: qty 1

## 2023-01-01 MED ORDER — SODIUM CHLORIDE 0.9 % IV SOLN
10.0000 mg | Freq: Once | INTRAVENOUS | Status: AC
  Administered 2023-01-01: 10 mg via INTRAVENOUS
  Filled 2023-01-01: qty 1
  Filled 2023-01-01: qty 10

## 2023-01-01 MED ORDER — HEPARIN SOD (PORK) LOCK FLUSH 100 UNIT/ML IV SOLN
500.0000 [IU] | Freq: Once | INTRAVENOUS | Status: DC | PRN
  Filled 2023-01-01: qty 5

## 2023-01-01 MED ORDER — PALONOSETRON HCL INJECTION 0.25 MG/5ML
0.2500 mg | Freq: Once | INTRAVENOUS | Status: AC
  Administered 2023-01-01: 0.25 mg via INTRAVENOUS
  Filled 2023-01-01: qty 5

## 2023-01-01 MED ORDER — SODIUM CHLORIDE 0.9 % IV SOLN
Freq: Once | INTRAVENOUS | Status: AC
  Filled 2023-01-01: qty 250

## 2023-01-01 MED ORDER — SODIUM CHLORIDE 0.9 % IV SOLN
272.8000 mg | Freq: Once | INTRAVENOUS | Status: AC
  Administered 2023-01-01: 270 mg via INTRAVENOUS
  Filled 2023-01-01: qty 27

## 2023-01-01 MED ORDER — FAMOTIDINE IN NACL 20-0.9 MG/50ML-% IV SOLN
20.0000 mg | Freq: Once | INTRAVENOUS | Status: AC
  Administered 2023-01-01: 20 mg via INTRAVENOUS
  Filled 2023-01-01: qty 50

## 2023-01-01 MED ORDER — SODIUM CHLORIDE 0.9 % IV SOLN
45.0000 mg/m2 | Freq: Once | INTRAVENOUS | Status: AC
  Administered 2023-01-01: 90 mg via INTRAVENOUS
  Filled 2023-01-01: qty 15

## 2023-01-01 NOTE — Progress Notes (Signed)
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  INTERVAL HISTORY: Patient returns to clinic today for further evaluation and reconsideration of cycle 6 of weekly carboplatinum and Taxol along with his daily XRT.  He continues to tolerate his treatments well without significant side effects.  He continues to have chronic bilateral hip pain.  He has no neurologic complaints.  He denies any recent fevers or illnesses.  He has a good appetite and denies weight loss.  He denies any chest pain, shortness of breath, cough, or hemoptysis.  He denies any nausea, vomiting, constipation, or diarrhea.  He has no urinary complaints.  Patient offers no further specific complaints today.  REVIEW OF SYSTEMS:   Review of Systems  Constitutional: Negative.  Negative for fever, malaise/fatigue and weight loss.  Respiratory: Negative.  Negative for cough, hemoptysis and shortness of breath.   Cardiovascular: Negative.  Negative for chest pain and leg swelling.  Gastrointestinal: Negative.  Negative for abdominal pain.  Genitourinary: Negative.  Negative for dysuria.  Musculoskeletal:  Positive for joint pain. Negative for back pain.  Skin: Negative.  Negative for rash.  Neurological: Negative.  Negative for dizziness, focal weakness, weakness and headaches.  Psychiatric/Behavioral: Negative.  The patient is not nervous/anxious.     As per HPI. Otherwise,  a complete review of systems is negative.  PAST MEDICAL HISTORY: Past Medical History:  Diagnosis Date   Acute encephalopathy    Acute hypoxic respiratory failure (HCC)    Alcohol abuse    Allergy    Seasonal   Anemia    Ataxia    BPH associated with nocturia    Brain tumor (benign) (HCC)    Carpal tunnel syndrome    bilateral   COPD (chronic obstructive pulmonary disease) (HCC)    Depression    Diabetes mellitus without complication (HCC)    Dyspnea    Headache    Hypertension    Mass of lower lobe of left lung    Meningioma (HCC)    Moderate cognitive impairment    Neuropathy    Both legs   Seizures (HCC)    last seizure was in January   SVT (supraventricular tachycardia) 2018   during intubation in ED    PAST SURGICAL HISTORY: Past Surgical History:  Procedure Laterality Date   CRANIOTOMY Left 11/06/2016   Procedure: LEFT Temporal CRANIOTOMY FOR TUMOR;  Surgeon: Coletta Memos, MD;  Location: [facility] OR;  Service: Neurosurgery;  Laterality: Left;  LEFT Temproal CRANIOTOMY FOR TUMOR    CRANIOTOMY Right 01/20/2020   Procedure: Right frontal craniotomy for tumor resection;  Surgeon: Coletta Memos, MD;  Location: [facility] OR;  Service: Neurosurgery;  Laterality: Right;   HERNIA REPAIR     UMBICIAL   IR IMAGING GUIDED PORT INSERTION  11/18/2022   VASECTOMY     VIDEO BRONCHOSCOPY WITH ENDOBRONCHIAL ULTRASOUND N/A 11/03/2022   Procedure: VIDEO BRONCHOSCOPY WITH ENDOBRONCHIAL ULTRASOUND;  Surgeon: Vida Rigger, MD;  Location: [facility] ORS;  Service: Thoracic;  Laterality: N/A;    FAMILY HISTORY: Family  History  Problem Relation Age of Onset   Cancer Sister        Breast    ADVANCED DIRECTIVES (Y/N):  N  HEALTH MAINTENANCE: Social History   Tobacco Use   Smoking status: Every Day    Packs/day: 2.00    Years: 43.00    Additional pack years: 0.00    Total pack years: 86.00    Types: Cigarettes   Smokeless tobacco: Never  Vaping Use   Vaping Use: Never used  Substance Use  Topics   Alcohol use: Yes    Comment: 7 beers daily   Drug use: Yes    Types: Marijuana    Comment: everyday     Colonoscopy:  PAP:  Bone density:  Lipid panel:  Allergies  Allergen Reactions   Keppra [Levetiracetam] Anxiety and Other (See Comments)    Severe mood changes   Chantix [Varenicline] Hives   Wasp Venom Swelling   Penicillins Rash    Has patient had a PCN reaction causing immediate rash, facial/tongue/throat swelling, SOB or lightheadedness with hypotension:YES Has patient had a PCN reaction causing severe rash involving mucus membranes or skin necrosis: NO Has patient had a PCN reaction that required hospitalization NO Has patient had a PCN reaction occurring within the last 10 years: NO If all of the above answers are "NO", then may proceed with Cephalosporin use.    Current Outpatient Medications  Medication Sig Dispense Refill   acetaminophen (TYLENOL) 500 MG tablet Take 1,000 mg by mouth every 6 (six) hours as needed.     apixaban (ELIQUIS) 5 MG TABS tablet Take 1 tablet (5 mg total) by mouth 2 (two) times daily. 60 tablet 1   Cholecalciferol (VITAMIN D-1000 MAX ST) 25 MCG (1000 UT) tablet Take by mouth.     gabapentin (NEURONTIN) 400 MG capsule Take 400 mg by mouth 3 (three) times daily.     HYDROcodone-acetaminophen (NORCO) 5-325 MG tablet Take 1 tablet by mouth every 8 (eight) hours as needed for moderate pain. 60 tablet 0   lamoTRIgine (LAMICTAL) 100 MG tablet Take 1 tablet (100 mg total) by mouth 2 (two) times daily. 60 tablet 5   lidocaine-prilocaine (EMLA) cream Apply to affected area once 30 g 3   metoprolol succinate (TOPROL XL) 25 MG 24 hr tablet Take 1 tablet (25 mg total) by mouth daily. 90 tablet 0   nicotine (NICOTROL) 10 MG inhaler Inhale 1 continuous puffing into the lungs as needed for smoking cessation.     ondansetron (ZOFRAN) 8 MG tablet Take 1 tablet (8 mg total) by mouth every 8 (eight) hours as needed for nausea or vomiting. Start on the  third day after chemotherapy. 60 tablet 1   ondansetron (ZOFRAN) 8 MG tablet Take by mouth.     predniSONE (DELTASONE) 10 MG tablet Take 10 mg by mouth daily.     prochlorperazine (COMPAZINE) 10 MG tablet TAKE 1 TABLET(10 MG) BY MOUTH EVERY 6 HOURS AS NEEDED FOR NAUSEA OR VOMITING 60 tablet 1   cholecalciferol (VITAMIN D3) 25 MCG (1000 UNIT) tablet Take 1,000 Units by mouth daily. (Patient not taking: Reported on 12/04/2022)     Fluticasone-Umeclidin-Vilant (TRELEGY ELLIPTA) 100-62.5-25 MCG/ACT AEPB Inhale 1 puff into the lungs every morning. (Patient not taking: Reported on 12/11/2022)     Fluticasone-Umeclidin-Vilant (TRELEGY ELLIPTA) 100-62.5-25 MCG/ACT AEPB Inhale into the lungs. (Patient not taking: Reported on 12/03/2022)     gabapentin (NEURONTIN) 300 MG capsule Take 1 capsule (300 mg) by mouth once a  day for 1 week, then 1 capsule (300 mg) by mouth two times a day for 1 week, then 1 capsule (300 mg) by mouth three times a day. (Patient not taking: Reported on 12/03/2022) 120 capsule 5   Multiple Vitamin (MULTI-VITAMIN) tablet Take 1 tablet by mouth daily. (Patient not taking: Reported on 12/04/2022)     nystatin (MYCOSTATIN) 100000 UNIT/ML suspension Take 5 mLs by mouth 4 (four) times daily. (Patient not taking: Reported on 12/03/2022)     sulfamethoxazole-trimethoprim (BACTRIM) 400-80 MG tablet Take 1 tablet by mouth 3 (three) times a week. (Patient not taking: Reported on 12/25/2022)     traMADol (ULTRAM) 50 MG tablet Take 1 tablet (50 mg total) by mouth every 6 (six) hours as needed. (Patient not taking: Reported on 12/11/2022) 30 tablet 0   No current facility-administered medications for this visit.   Facility-Administered Medications Ordered in Other Visits  Medication Dose Route Frequency Provider Last Rate Last Admin   CARBOplatin (PARAPLATIN) 270 mg in sodium chloride 0.9 % 100 mL chemo infusion  270 mg Intravenous Once Jeralyn Ruths, MD       heparin lock flush 100 unit/mL  500 Units  Intracatheter Once PRN Jeralyn Ruths, MD       PACLitaxel (TAXOL) 90 mg in sodium chloride 0.9 % 250 mL chemo infusion (</= 80mg /m2)  45 mg/m2 (Treatment Plan Recorded) Intravenous Once Jeralyn Ruths, MD 265 mL/hr at 01/01/23 1247 90 mg at 01/01/23 1247    OBJECTIVE: Vitals:   01/01/23 1028  BP: 126/77  Pulse: 89  Resp: 16  Temp: (!) 97.1 F (36.2 C)  SpO2: 97%     Body mass index is 22.82 kg/m.    ECOG FS:0 - Asymptomatic  General: Well-developed, well-nourished, no acute distress.  Sitting in a wheelchair. Eyes: Pink conjunctiva, anicteric sclera. HEENT: Normocephalic, moist mucous membranes. Lungs: No audible wheezing or coughing. Heart: Regular rate and rhythm. Abdomen: Soft, nontender, no obvious distention. Musculoskeletal: No edema, cyanosis, or clubbing. Neuro: Alert, answering all questions appropriately. Cranial nerves grossly intact. Skin: No rashes or petechiae noted. Psych: Normal affect.  LAB RESULTS:  Lab Results  Component Value Date   NA 136 01/01/2023   K 3.6 01/01/2023   CL 100 01/01/2023   CO2 24 01/01/2023   GLUCOSE 92 01/01/2023   BUN 7 (L) 01/01/2023   CREATININE 0.62 01/01/2023   CALCIUM 8.5 (L) 01/01/2023   PROT 7.0 01/01/2023   ALBUMIN 3.4 (L) 01/01/2023   AST 20 01/01/2023   ALT 14 01/01/2023   ALKPHOS 69 01/01/2023   BILITOT 0.5 01/01/2023   GFRNONAA >60 01/01/2023   GFRAA >60 01/18/2020    Lab Results  Component Value Date   WBC 3.2 (L) 01/01/2023   NEUTROABS 2.7 01/01/2023   HGB 11.9 (L) 01/01/2023   HCT 34.8 (L) 01/01/2023   MCV 94.1 01/01/2023   PLT 89 (L) 01/01/2023     STUDIES: ECHOCARDIOGRAM COMPLETE  Result Date: 12/31/2022    ECHOCARDIOGRAM REPORT   Patient Name:   CARLY MILOS Date of Exam: 12/31/2022 Medical Rec #:  [id number]       Height:       73.0 in Accession #:    [id number]      Weight:       178.0 lb Date of Birth:  07-Mar-1961       BSA:          2.048 m Patient Age:    62 years  BP:            128/86 mmHg Patient Gender: M               HR:           88 bpm. Exam Location:  [place] Procedure: 2D Echo, 3D Echo, Cardiac Doppler, Color Doppler and Strain Analysis Indications:    ; I48.92* Unspecified atrial flutter  History:        Patient has no prior history of Echocardiogram examinations.                 Arrythmias:Atrial Flutter; Risk Factors:Hypertension, Diabetes                 and Current Smoker.  Sonographer:    Quentin Ore RDMS, RVT, RDCS Referring Phys: [id number] Raymon Mutton DUNN IMPRESSIONS  1. Left ventricular ejection fraction, by estimation, is 45 to 50%. The left ventricle has mildly decreased function. The left ventricle demonstrates global hypokinesis. There is mild left ventricular hypertrophy. Left ventricular diastolic parameters are indeterminate.  2. Right ventricular systolic function is normal. The right ventricular size is mildly enlarged.  3. The mitral valve is normal in structure. Trivial mitral valve regurgitation.  4. The aortic valve was not well visualized. Aortic valve regurgitation is not visualized. FINDINGS  Left Ventricle: Left ventricular ejection fraction, by estimation, is 45 to 50%. The left ventricle has mildly decreased function. The left ventricle demonstrates global hypokinesis. Global longitudinal strain performed but not reported based on interpreter judgement due to suboptimal tracking. The left ventricular internal cavity size was normal in size. There is mild left ventricular hypertrophy. Left ventricular diastolic parameters are indeterminate. Right Ventricle: The right ventricular size is mildly enlarged. No increase in right ventricular wall thickness. Right ventricular systolic function is normal. Left Atrium: Left atrial size was normal in size. Right Atrium: Right atrial size was normal in size. Pericardium: There is no evidence of pericardial effusion. Mitral Valve: The mitral valve is normal in structure. Trivial mitral valve regurgitation. Tricuspid  Valve: The tricuspid valve is normal in structure. Tricuspid valve regurgitation is not demonstrated. Aortic Valve: The aortic valve was not well visualized. Aortic valve regurgitation is not visualized. Aortic valve mean gradient measures 2.0 mmHg. Aortic valve peak gradient measures 3.5 mmHg. Aortic valve area, by VTI measures 3.79 cm. Pulmonic Valve: The pulmonic valve was not well visualized. Pulmonic valve regurgitation is not visualized. Aorta: The aortic root and ascending aorta are structurally normal, with no evidence of dilitation. Venous: The inferior vena cava was not well visualized. IAS/Shunts: No atrial level shunt detected by color flow Doppler.  LEFT VENTRICLE PLAX 2D LVIDd:         5.70 cm LVIDs:         4.40 cm LV PW:         1.10 cm LV IVS:        1.30 cm LVOT diam:     2.30 cm      3D Volume EF: LV SV:         58           3D EF:        40 % LV SV Index:   28           LV EDV:       129 ml LVOT Area:     4.15 cm     LV ESV:       77 ml  LV SV:        51 ml  LV Volumes (MOD) LV vol d, MOD A2C: 89.8 ml LV vol d, MOD A4C: 105.0 ml LV vol s, MOD A2C: 53.1 ml LV vol s, MOD A4C: 61.5 ml LV SV MOD A2C:     36.7 ml LV SV MOD A4C:     105.0 ml LV SV MOD BP:      40.9 ml RIGHT VENTRICLE RV Basal diam:  4.20 cm RV S prime:     10.30 cm/s TAPSE (M-mode): 1.7 cm LEFT ATRIUM             Index        RIGHT ATRIUM           Index LA diam:        5.00 cm 2.44 cm/m   RA Area:     18.10 cm LA Vol (A2C):   71.1 ml 34.72 ml/m  RA Volume:   49.80 ml  24.32 ml/m LA Vol (A4C):   57.9 ml 28.28 ml/m LA Biplane Vol: 65.6 ml 32.04 ml/m  AORTIC VALVE                    PULMONIC VALVE AV Area (Vmax):    3.54 cm     PV Vmax:       0.82 m/s AV Area (Vmean):   3.45 cm     PV Peak grad:  2.7 mmHg AV Area (VTI):     3.79 cm AV Vmax:           94.10 cm/s AV Vmean:          63.000 cm/s AV VTI:            0.152 m AV Peak Grad:      3.5 mmHg AV Mean Grad:      2.0 mmHg LVOT Vmax:         80.23  cm/s LVOT Vmean:        52.333 cm/s LVOT VTI:          0.139 m LVOT/AV VTI ratio: 0.91  AORTA Ao Root diam: 3.40 cm Ao Asc diam:  3.40 cm Ao Arch diam: 2.5 cm TRICUSPID VALVE TR Peak grad:   19.4 mmHg TR Vmax:        220.00 cm/s  SHUNTS Systemic VTI:  0.14 m Systemic Diam: 2.30 cm Debbe Odea MD Electronically signed by Debbe Odea MD Signature Date/Time: 12/31/2022/3:46:05 PM    Final     ASSESSMENT: 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  PET scan results from September 11, 2022 reviewed independently confirming stage of disease.  MRI of the brain on September 22, 2022 did not reveal any metastatic disease. He will benefit from concurrent chemotherapy with weekly carboplatin and Taxol with 5-8 cycles along with daily XRT.  Once he completes his concurrent treatment, he will benefit from maintenance durvalumab every 2 weeks for an entire year.  Patient has had port placement.  Despite mild thrombocytopenia, will proceed with cycle 6 of treatment today.  Continue daily XRT completing treatment on Jan 15, 2023.  Return to clinic in 1 week for further evaluation and consideration of cycle 7.  Hyponatremia: Resolved. Anemia: Mild, monitor. Thrombocytopenia: Patient's platelet count has improved to 89.  Proceed cautiously with treatment as above.  Leukopenia: Chronic and unchanged, monitor. Joint pain: Likely secondary to arthritis and unrelated to malignancy.  Continue Norco as prescribed.  Patient was given a referral to orthopedics, but he has not yet scheduled the appointment.  Patient expressed understanding and was in agreement with this plan. He also understands that He can call clinic at any time with any questions, concerns, or complaints.    Cancer Staging  Squamous cell carcinoma of lower lobe of left lung [facility]) Staging form: Lung, AJCC  8th Edition - Clinical stage from 11/11/2022: Stage IIIA (cT4, cN1, cM0) - Signed by Jeralyn Ruths, MD on 11/11/2022 Stage prefix: Initial diagnosis   Jeralyn Ruths, MD   01/01/2023 1:29 PM

## 2023-01-01 NOTE — Patient Instructions (Signed)
[facility] [place] [facility] [facility]  Discharge Instructions: Thank you for choosing [facility] to provide your oncology and hematology care.  If you have a lab appointment with the Cancer Center, please go directly to the Cancer Center and check in at the registration area.  Wear comfortable clothing and clothing appropriate for easy access to any Portacath or PICC line.   We strive to give you quality time with your provider. You may need to reschedule your appointment if you arrive late (15 or more minutes).  Arriving late affects you and other patients whose appointments are after yours.  Also, if you miss three or more appointments without notifying the office, you may be dismissed from the clinic at the provider's discretion.      For prescription refill requests, have your pharmacy contact our office and allow 72 hours for refills to be completed.    Today you received the following chemotherapy and/or immunotherapy agents Taxol, Carboplatin      To help prevent nausea and vomiting after your treatment, we encourage you to take your nausea medication as directed.  BELOW ARE SYMPTOMS THAT SHOULD BE REPORTED IMMEDIATELY: *FEVER GREATER THAN 100.4 F (38 C) OR HIGHER *CHILLS OR SWEATING *NAUSEA AND VOMITING THAT IS NOT CONTROLLED WITH YOUR NAUSEA MEDICATION *UNUSUAL SHORTNESS OF BREATH *UNUSUAL BRUISING OR BLEEDING *URINARY PROBLEMS (pain or burning when urinating, or frequent urination) *BOWEL PROBLEMS (unusual diarrhea, constipation, pain near the anus) TENDERNESS IN MOUTH AND THROAT WITH OR WITHOUT PRESENCE OF ULCERS (sore throat, sores in mouth, or a toothache) UNUSUAL RASH, SWELLING OR PAIN  UNUSUAL VAGINAL DISCHARGE OR ITCHING   Items with * indicate a potential emergency and should be followed up as soon as possible or go to the Emergency Department if any problems should occur.  Please show the CHEMOTHERAPY ALERT CARD or IMMUNOTHERAPY ALERT CARD at  check-in to the Emergency Department and triage nurse.  Should you have questions after your visit or need to cancel or reschedule your appointment, please contact [facility] [place] [facility]  [phone] and follow the prompts.  Office hours are 8:00 a.m. to 4:30 p.m. Monday - Friday. Please note that voicemails left after 4:00 p.m. may not be returned until the following business day.  We are closed weekends and major holidays. You have access to a nurse at all times for urgent questions. Please call the main number to the clinic [phone] and follow the prompts.  For any non-urgent questions, you may also contact your provider using MyChart. We now offer e-Visits for anyone 18 and older to request care online for non-urgent symptoms. For details visit [email or web address].   Also download the MyChart app! Go to the app store, search "MyChart", open the app, select [place], and log in with your MyChart username and password.    

## 2023-01-01 NOTE — Progress Notes (Signed)
Cardiology Office Note    Date:  01/06/2023   ID:  Frederick Drake, DOB 01-25-1961, MRN [id number]  PCP:  Mick Sell, MD  Cardiologist:  Lorine Bears, MD  Electrophysiologist:  None   Chief Complaint: Follow up  History of Present Illness:   Frederick Drake is a 62 y.o. 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   He underwent lung cancer screening CT in 01/2021 with concerns for possible obstructing mass as well as bronchiectasis. He was seen by pulmonology in 04/2021 and treated with doxycycline with follow-up chest x-ray showing some resolution. Following this, he was lost to follow-up. He followed up with his PCP in 05/2022 with repeat chest CT showing progressive infiltrative neoplasm in the left lower lobe of the lung. Subsequent PET scan showed enlargement with intense hypermetabolic activity. The mass extended into the left infrahilar lung and obstructing the bronchus. He has been evaluated by pulmonology with plans to undergo video-assisted bronchoscopy.  However, upon arriving to preop he was incidentally noted to be in rate controlled atrial flutter with variable AV block.  He was asymptomatic.  It was also noted the patient's PCP had previously considered stress testing due to coronary artery calcification noted on CT imaging.  In this setting, we evaluated the patient for preoperative cardiac risk stratification at which time he was felt to be acceptable risk for bronchoscopy.  The patient was  without symptoms of angina or cardiac decompensation.  He continued to smoke 1.5 to 2 packs/day.  With regards to [facility], his CHA2DS2-VASc was 3 with recommendation for this to be addressed with him in the outpatient setting following bronchoscopy/biopsy.  He was seen in hospital follow-up on 12/03/2022 and was without symptoms of angina or cardiac decompensation.  He remained in atrial flutter with variable AV block.  He was started on apixaban 5 mg twice daily with plans to pursue DCCV following adequate anticoagulation.  Echo on 12/31/2022 showed an EF of 45 to 50%, global hypokinesis, mild LVH, normal RV systolic function with mildly enlarged ventricular cavity size, and trivial mitral regurgitation.  He comes in accompanied by his wife today, and is without symptoms of angina or cardiac decompensation.  He has stable chronic dyspnea.  No palpitations, dizziness, presyncope, or syncope.  He does note some left lower extremity discomfort extending from the hip to the knee that occurs at rest and with ambulation.  No nonhealing wounds.  He has been adherent to apixaban twice daily and denies missing any doses.  No falls, hematochezia, or melena.  He does have an area of erythema along the left back, currently receiving radiation to this area.  His weight is down 8 pounds when compared to his last clinic visit.  He reports intermittent appetite.  He has decreased his tobacco use.     Labs independently reviewed: 12/2022 - Hgb 11.9, PLT 89, potassium 3.6, BUN 7, serum creatinine 0.62, albumin 3.4, AST/ALT normal 05/2022 - TC 177, TG 76, HDL 61, LDL 101, TSH normal    Past Medical History:  Diagnosis Date   Acute encephalopathy    Acute  hypoxic respiratory failure (HCC)    Alcohol abuse    Allergy    Seasonal   Anemia    Ataxia    BPH associated with nocturia    Brain tumor (benign) (HCC)    Carpal tunnel syndrome    bilateral   COPD (chronic obstructive pulmonary disease) (HCC)    Depression     Diabetes mellitus without complication (HCC)    Dyspnea    Headache    Hypertension    Mass of lower lobe of left lung    Meningioma (HCC)    Moderate cognitive impairment    Neuropathy    Both legs   Seizures (HCC)    last seizure was in January   SVT (supraventricular tachycardia) 2018   during intubation in ED    Past Surgical History:  Procedure Laterality Date   CRANIOTOMY Left 11/06/2016   Procedure: LEFT Temporal CRANIOTOMY FOR TUMOR;  Surgeon: Coletta Memos, MD;  Location[facility] OR;  Service: Neurosurgery;  Laterality: Left;  LEFT Temproal CRANIOTOMY FOR TUMOR    CRANIOTOMY Right 01/20/2020   Procedure: Right frontal craniotomy for tumor resection;  Surgeon: Coletta Memos, MD;  Location: [facility] OR;  Service: Neurosurgery;  Laterality: Right;   HERNIA REPAIR     UMBICIAL   IR IMAGING GUIDED PORT INSERTION  11/18/2022   VASECTOMY     VIDEO BRONCHOSCOPY WITH ENDOBRONCHIAL ULTRASOUND N/A 11/03/2022   Procedure: VIDEO BRONCHOSCOPY WITH ENDOBRONCHIAL ULTRASOUND;  Surgeon: Vida Rigger, MD;  Location: [facility] ORS;  Service: Thoracic;  Laterality: N/A;    Current Medications: Current Meds  Medication Sig   acetaminophen (TYLENOL) 500 MG tablet Take 1,000 mg by mouth every 6 (six) hours as needed.   apixaban (ELIQUIS) 5 MG TABS tablet Take 1 tablet (5 mg total) by mouth 2 (two) times daily.   Cholecalciferol (VITAMIN D-1000 MAX ST) 25 MCG (1000 UT) tablet Take by mouth.   cholecalciferol (VITAMIN D3) 25 MCG (1000 UNIT) tablet Take 1,000 Units by mouth daily.   Fluticasone-Umeclidin-Vilant (TRELEGY ELLIPTA) 100-62.5-25 MCG/ACT AEPB Inhale 1 puff into the lungs every morning.   gabapentin (NEURONTIN) 400 MG capsule Take 400 mg by mouth 3 (three) times daily.   HYDROcodone-acetaminophen (NORCO) 5-325 MG tablet Take 1 tablet by mouth every 8 (eight) hours as needed for moderate pain.   lamoTRIgine (LAMICTAL) 100 MG tablet Take 1 tablet (100 mg total) by mouth 2 (two) times daily.   losartan  (COZAAR) 25 MG tablet Take 0.5 tablets (12.5 mg total) by mouth daily. Take half a tablet (12.5 mg) once daily   metoprolol succinate (TOPROL XL) 25 MG 24 hr tablet Take 1 tablet (25 mg total) by mouth daily.   predniSONE (DELTASONE) 10 MG tablet Take 10 mg by mouth daily.   prochlorperazine (COMPAZINE) 10 MG tablet TAKE 1 TABLET(10 MG) BY MOUTH EVERY 6 HOURS AS NEEDED FOR NAUSEA OR VOMITING   sulfamethoxazole-trimethoprim (BACTRIM) 400-80 MG tablet Take 1 tablet by mouth 3 (three) times a week.    Allergies:   Keppra [levetiracetam], Chantix [varenicline], Wasp venom, and Penicillins   Social History   Socioeconomic History   Marital status: Married    Spouse name: Not on file   Number of children: Not on file   Years of education: Not on file   Highest education level: Not on file  Occupational History   Not on file  Tobacco Use   Smoking status: Every Day    Packs/day: 2.00    Years: 43.00  Additional pack years: 0.00    Total pack years: 86.00    Types: Cigarettes   Smokeless tobacco: Never  Vaping Use   Vaping Use: Never used  Substance and Sexual Activity   Alcohol use: Yes    Comment: 7 beers daily   Drug use: Yes    Types: Marijuana    Comment: everyday   Sexual activity: Not on file  Other Topics Concern   Not on file  Social History Narrative   Lives at home with wife. Patient does not have a good memory and has issues with transportation.    Social Determinants of Health   Financial Resource Strain: Medium Risk (12/09/2022)   Overall Financial Resource Strain (CARDIA)    Difficulty of Paying Living Expenses: Somewhat hard  Food Insecurity: Food Insecurity Present (12/09/2022)   Hunger Vital Sign    Worried About Running Out of Food in the Last Year: Never true    Ran Out of Food in the Last Year: Sometimes true  Transportation Needs: Unmet Transportation Needs (01/02/2023)   PRAPARE - Administrator, Civil Service (Medical): Yes    Lack of  Transportation (Non-Medical): Yes  Physical Activity: Inactive (12/09/2022)   Exercise Vital Sign    Days of Exercise per Week: 0 days    Minutes of Exercise per Session: 0 min  Stress: Stress Concern Present (12/09/2022)   [employer] of Occupational Health - Occupational Stress Questionnaire    Feeling of Stress : To some extent  Social Connections: Socially Isolated (12/09/2022)   Social Connection and Isolation Panel [NHANES]    Frequency of Communication with Friends and Family: Once a week    Frequency of Social Gatherings with Friends and Family: Once a week    Attends Religious Services: Never    Diplomatic Services operational officer: Not on file    Attends Banker Meetings: Never    Marital Status: Married     Family History:  The patient's family history includes Cancer in his sister.  ROS:   12-point review of systems is negative unless otherwise noted in the HPI.   EKGs/Labs/Other Studies Reviewed:    Studies reviewed were summarized above. The additional studies were reviewed today:  2D echo 12/31/2022: 1. Left ventricular ejection fraction, by estimation, is 45 to 50%. The  left ventricle has mildly decreased function. The left ventricle  demonstrates global hypokinesis. There is mild left ventricular  hypertrophy. Left ventricular diastolic parameters  are indeterminate.   2. Right ventricular systolic function is normal. The right ventricular  size is mildly enlarged.   3. The mitral valve is normal in structure. Trivial mitral valve  regurgitation.   4. The aortic valve was not well visualized. Aortic valve regurgitation  is not visualized.    EKG:  EKG is ordered today.  The EKG ordered today demonstrates atrial flutter with 2 1 AV block, 92 bpm, nonspecific ST-T changes  Recent Labs: 12/11/2022: Magnesium 2.0; TSH 1.205 01/01/2023: ALT 14; BUN 7; Creatinine 0.62; Hemoglobin 11.9; Platelet Count 89; Potassium 3.6; Sodium 136  Recent  Lipid Panel    Component Value Date/Time   CHOL 185 03/05/2018 1204   TRIG 39 01/13/2020 0705   HDL 86 03/05/2018 1204   CHOLHDL 4.6 07/02/2017 1947   LDLCALC 89 03/05/2018 1204    PHYSICAL EXAM:    VS:  BP 122/76 (BP Location: Left Arm, Patient Position: Sitting, Cuff Size: Normal)   Pulse 92   Ht 6' (  1.829 m)   Wt 172 lb 9.6 oz (78.3 kg)   SpO2 98%   BMI 23.41 kg/m   BMI: Body mass index is 23.41 kg/m.  Physical Exam Vitals reviewed.  Constitutional:      Appearance: He is well-developed.  HENT:     Head: Normocephalic and atraumatic.  Eyes:     General:        Right eye: No discharge.        Left eye: No discharge.  Neck:     Vascular: No JVD.  Cardiovascular:     Rate and Rhythm: Normal rate and regular rhythm.     Heart sounds: Normal heart sounds, S1 normal and S2 normal. Heart sounds not distant. No midsystolic click and no opening snap. No murmur heard.    No friction rub.  Pulmonary:     Effort: Pulmonary effort is normal. No respiratory distress.     Breath sounds: Normal breath sounds. No decreased breath sounds, wheezing or rales.  Chest:     Chest wall: No tenderness.  Abdominal:     General: There is no distension.  Musculoskeletal:     Cervical back: Normal range of motion.     Right lower leg: No edema.     Left lower leg: No edema.     Comments: Region of mild erythema along the left side of the thoracic back.  Not consistent with bruising.  Skin:    General: Skin is warm and dry.     Nails: There is no clubbing.  Neurological:     Mental Status: He is alert and oriented to person, place, and time.  Psychiatric:        Speech: Speech normal.        Behavior: Behavior normal.        Thought Content: Thought content normal.        Judgment: Judgment normal.     Wt Readings from Last 3 Encounters:  01/06/23 172 lb 9.6 oz (78.3 kg)  01/01/23 173 lb (78.5 kg)  12/25/22 178 lb (80.7 kg)     ASSESSMENT & PLAN:   Atrial flutter: He  remains in atrial flutter with controlled ventricular response on Toprol-XL 25 mg daily.  He has been adequately anticoagulated with apixaban 5 mg twice daily (does not meet reduced dosing criteria) without missing any doses.  CHA2DS2-VASc at least 4 (CHF, HTN, DM, vascular disease).  Schedule DCCV.  The importance of not missing anticoagulation was discussed with the patient.  HFrEF: He is euvolemic and well compensated.  Add losartan 12.5 mg daily with a follow-up BMP 1 week thereafter.  Continue Toprol-XL 25 mg daily.  Look to repeat a limited echo 1 month after DCCV for improvement in LV systolic function.  Escalate GDMT as indicated.  Not currently requiring standing loop diuretic.  Coronary artery calcification: No symptoms suggestive of angina.  Recent echo earlier this month showed LV dysfunction with an EF of 45 to 50%.  This may be in the setting of atrial arrhythmia.  Look to repeat echo following DCCV as outlined above.  Will benefit from further ischemic testing following restoration of sinus rhythm and repeat echo given longstanding history of dyspnea and risk factors.  Look to add statin at next visit.  PSVT: Quiescent.  He remains on Toprol-XL as outlined above.  HTN: Blood pressure is well-controlled in the office today.  Medical therapy as outlined above.  Squamous cell carcinoma of the lower lobe of the left lung  with left thoracic back erythema: Does not appear to be consistent with bruising or hematoma.  Query if this is related to ongoing daily radiation therapy.  Will obtain CT chest to evaluate for significant hematoma and CBC given he is on apixaban.  Otherwise, follow-up with radiation oncology.  Tobacco use: He has decreased use.  Complete cessation is recommended.  Claudication: Schedule ABI and lower extremity arterial ultrasound.   Shared Decision Making/Informed Consent{  The risks (stroke, cardiac arrhythmias rarely resulting in the need for a temporary or permanent  pacemaker, skin irritation or burns and complications associated with conscious sedation including aspiration, arrhythmia, respiratory failure and death), benefits (restoration of normal sinus rhythm) and alternatives of a direct current cardioversion were explained in detail to Frederick Drake and he agrees to proceed.       Disposition: F/u with Dr. Kirke Corin or an APP in 1 month.   Medication Adjustments/Labs and Tests Ordered: Current medicines are reviewed at length with the patient today.  Concerns regarding medicines are outlined above. Medication changes, Labs and Tests ordered today are summarized above and listed in the Patient Instructions accessible in Encounters.   SignedEula Listen, PA-C 01/06/2023 10:42 AM     [facility] [place] [address] [place], [place] [address] [phone]

## 2023-01-02 ENCOUNTER — Inpatient Hospital Stay: Payer: Medicare PPO

## 2023-01-02 ENCOUNTER — Other Ambulatory Visit: Payer: Self-pay

## 2023-01-02 ENCOUNTER — Ambulatory Visit
Admission: RE | Admit: 2023-01-02 | Discharge: 2023-01-02 | Disposition: A | Discharge status: Home / Self Care | Payer: Medicare PPO | Source: Ambulatory Visit | Attending: Radiation Oncology | Admitting: Radiation Oncology

## 2023-01-02 ENCOUNTER — Encounter: Payer: Self-pay | Admitting: *Deleted

## 2023-01-02 DIAGNOSIS — Z510 Encounter for antineoplastic radiation therapy: Secondary | ICD-10-CM | POA: Diagnosis not present

## 2023-01-02 LAB — RAD ONC ARIA SESSION SUMMARY
Course Elapsed Days: 39
Plan Fractions Treated to Date: 27
Plan Prescribed Dose Per Fraction: 2 Gy
Plan Total Fractions Prescribed: 35
Plan Total Prescribed Dose: 70 Gy
Reference Point Dosage Given to Date: 54 Gy
Reference Point Session Dosage Given: 2 Gy
Session Number: 27

## 2023-01-04 ENCOUNTER — Other Ambulatory Visit: Payer: Self-pay

## 2023-01-05 ENCOUNTER — Ambulatory Visit: Payer: Medicare PPO

## 2023-01-05 ENCOUNTER — Inpatient Hospital Stay: Payer: Medicare PPO

## 2023-01-05 ENCOUNTER — Telehealth: Payer: Self-pay | Admitting: Radiation Oncology

## 2023-01-05 NOTE — Telephone Encounter (Signed)
This patient's wife [name] called and left a message with answering service to cancel Daivik's appt today 01/05/2023 with radiation. Team was notified.

## 2023-01-06 ENCOUNTER — Other Ambulatory Visit: Payer: Self-pay

## 2023-01-06 ENCOUNTER — Encounter: Payer: Self-pay | Admitting: Physician Assistant

## 2023-01-06 ENCOUNTER — Ambulatory Visit: Payer: Medicare PPO | Attending: Physician Assistant | Admitting: Physician Assistant

## 2023-01-06 ENCOUNTER — Ambulatory Visit
Admission: RE | Admit: 2023-01-06 | Discharge: 2023-01-06 | Disposition: A | Discharge status: Home / Self Care | Payer: Medicare PPO | Source: Ambulatory Visit | Attending: Radiation Oncology | Admitting: Radiation Oncology

## 2023-01-06 ENCOUNTER — Other Ambulatory Visit
Admission: RE | Admit: 2023-01-06 | Discharge: 2023-01-06 | Disposition: A | Discharge status: Home / Self Care | Payer: Medicare PPO | Attending: Physician Assistant | Admitting: Physician Assistant

## 2023-01-06 ENCOUNTER — Inpatient Hospital Stay: Payer: Medicare PPO

## 2023-01-06 VITALS — BP 122/76 | HR 92 | Ht 72.0 in | Wt 172.6 lb

## 2023-01-06 DIAGNOSIS — I5020 Unspecified systolic (congestive) heart failure: Secondary | ICD-10-CM | POA: Diagnosis not present

## 2023-01-06 DIAGNOSIS — L539 Erythematous condition, unspecified: Secondary | ICD-10-CM | POA: Diagnosis present

## 2023-01-06 DIAGNOSIS — I10 Essential (primary) hypertension: Secondary | ICD-10-CM | POA: Diagnosis not present

## 2023-01-06 DIAGNOSIS — I471 Supraventricular tachycardia, unspecified: Secondary | ICD-10-CM | POA: Diagnosis present

## 2023-01-06 DIAGNOSIS — I2510 Atherosclerotic heart disease of native coronary artery without angina pectoris: Secondary | ICD-10-CM | POA: Diagnosis not present

## 2023-01-06 DIAGNOSIS — Z720 Tobacco use: Secondary | ICD-10-CM | POA: Diagnosis present

## 2023-01-06 DIAGNOSIS — I2584 Coronary atherosclerosis due to calcified coronary lesion: Secondary | ICD-10-CM | POA: Insufficient documentation

## 2023-01-06 DIAGNOSIS — I739 Peripheral vascular disease, unspecified: Secondary | ICD-10-CM

## 2023-01-06 DIAGNOSIS — C3432 Malignant neoplasm of lower lobe, left bronchus or lung: Secondary | ICD-10-CM

## 2023-01-06 DIAGNOSIS — I4892 Unspecified atrial flutter: Secondary | ICD-10-CM

## 2023-01-06 DIAGNOSIS — Z510 Encounter for antineoplastic radiation therapy: Secondary | ICD-10-CM | POA: Diagnosis not present

## 2023-01-06 LAB — CBC
HCT: 36.7 % — ABNORMAL LOW (ref 39.0–52.0)
Hemoglobin: 13.2 g/dL (ref 13.0–17.0)
MCH: 34.1 pg — ABNORMAL HIGH (ref 26.0–34.0)
MCHC: 36 g/dL (ref 30.0–36.0)
MCV: 94.8 fL (ref 80.0–100.0)
Platelets: 109 10*3/uL — ABNORMAL LOW (ref 150–400)
RBC: 3.87 MIL/uL — ABNORMAL LOW (ref 4.22–5.81)
RDW: 18.9 % — ABNORMAL HIGH (ref 11.5–15.5)
WBC: 3.6 10*3/uL — ABNORMAL LOW (ref 4.0–10.5)
nRBC: 0 % (ref 0.0–0.2)

## 2023-01-06 LAB — RAD ONC ARIA SESSION SUMMARY
Course Elapsed Days: 43
Plan Fractions Treated to Date: 28
Plan Prescribed Dose Per Fraction: 2 Gy
Plan Total Fractions Prescribed: 35
Plan Total Prescribed Dose: 70 Gy
Reference Point Dosage Given to Date: 56 Gy
Reference Point Session Dosage Given: 2 Gy
Session Number: 28

## 2023-01-06 LAB — BASIC METABOLIC PANEL
Anion gap: 10 (ref 5–15)
BUN: 11 mg/dL (ref 8–23)
CO2: 30 mmol/L (ref 22–32)
Calcium: 9.2 mg/dL (ref 8.9–10.3)
Chloride: 95 mmol/L — ABNORMAL LOW (ref 98–111)
Creatinine, Ser: 0.66 mg/dL (ref 0.61–1.24)
GFR, Estimated: >60 mL/min (ref >60)
Glucose, Bld: 119 mg/dL — ABNORMAL HIGH (ref 70–99)
Potassium: 4.1 mmol/L (ref 3.5–5.1)
Sodium: 135 mmol/L (ref 135–145)

## 2023-01-06 MED ORDER — LOSARTAN POTASSIUM 25 MG PO TABS: 12.5000 mg | ORAL_TABLET | Freq: Every day | ORAL | 3 refills | Status: DC

## 2023-01-06 NOTE — Patient Instructions (Signed)
Medication Instructions:  Your physician has recommended you make the following change in your medication:   START Losartan 12.5 mg once daily   *If you need a refill on your cardiac medications before your next appointment, please call your pharmacy*   Lab Work: CBC & BMET today over at the [facility]. Stop at registration desk to check in.  If you have labs (blood work) drawn today and your tests are completely normal, you will receive your results only by: MyChart Message (if you have MyChart) OR A paper copy in the mail If you have any lab test that is abnormal or we need to change your treatment, we will call you to review the results.   Testing/Procedures: Non-Cardiac CT scanning, (CAT scanning), is a noninvasive, special x-ray that produces cross-sectional images of the body using x-rays and a computer. CT scans help physicians diagnose and treat medical conditions. For some CT exams, a contrast material is used to enhance visibility in the area of the body being studied. CT scans provide greater clarity and reveal more details than regular x-ray exams.  Your physician has requested that you have an ankle brachial index (ABI). During this test an ultrasound and blood pressure cuff are used to evaluate the arteries that supply the arms and legs with blood. Allow thirty minutes for this exam. There are no restrictions or special instructions.  Your physician has requested that you have a lower extremity arterial exercise duplex. During this test, exercise and ultrasound are used to evaluate arterial blood flow in the legs. Allow one hour for this exam. There are no restrictions or special instructions.  You are scheduled for a Cardioversion on 01/19/23 with Dr. Kirke Corin.    Please arrive at the [facility] Entrance of [facility], [address], [place] [address] at 06:30 am (This is 1 hour prior to your procedure time).  Proceed to the Check-In Desk directly inside the  entrance.  Procedure Parking: Use the entrance off of the [address] side of the hospital. Turn right upon entering and follow the driveway to parking that is directly in front of the Heart [facility] Vascular Center. There is no valet parking available at this entrance, however there is an awning directly in front of the Heart [facility] Vascular Center for drop off/ pick up for patients  DIET: Nothing to eat or drink after midnight except a sip of water with medications (see medication instructions below)  Medication Instructions: Take all of your medications with small sip of water.  Continue your anticoagulant: Eliquis If you miss a dose, please call us at [phone] You will need to continue your anticoagulant after your procedure until you are told by your provider that it is safe to stop.   Labs: TODAY OVER AT THE [facility] MEDICAL MALL.   FYI: For your safety, and to allow [place] to monitor your vital signs accurately during the surgery/procedure we request that if you have artificial nails, gel coating, SNS etc. Please have those removed prior to your surgery/procedure. Not having the nail coverings /polish removed may result in cancellation or delay of your surgery/procedure.  You must have a responsible person to drive you home and stay in the waiting area during your procedure. Failure to do so could result in cancellation.  Bring your insurance cards.  If you have any questions after you get home, please call the office at [phone]  *Special Note: Every effort is made to have your procedure done  on time. Occasionally there are emergencies that occur at the hospital that may cause delays. Please be patient if a delay does occur.    +    Follow-Up: At [facility], you and your health needs are our priority.  As part of our continuing mission to provide you with exceptional heart care, we have created designated Provider Care Teams.  These Care Teams include your primary Cardiologist  (physician) and Advanced Practice Providers (APPs -  Physician Assistants and Nurse Practitioners) who all work together to provide you with the care you need, when you need it.  We recommend signing up for the patient portal called "MyChart".  Sign up information is provided on this After Visit Summary.  MyChart is used to connect with patients for Virtual Visits (Telemedicine).  Patients are able to view lab/test results, encounter notes, upcoming appointments, etc.  Non-urgent messages can be sent to your provider as well.   To learn more about what you can do with MyChart, go to [email or web address].    Your next appointment:   1 month(s)  Provider:   Lorine Bears, MD or Eula Listen, PA-C

## 2023-01-07 ENCOUNTER — Inpatient Hospital Stay: Payer: Medicare PPO

## 2023-01-07 ENCOUNTER — Other Ambulatory Visit: Payer: Self-pay | Admitting: Oncology

## 2023-01-07 ENCOUNTER — Other Ambulatory Visit: Payer: Self-pay

## 2023-01-07 ENCOUNTER — Encounter: Payer: Self-pay | Admitting: *Deleted

## 2023-01-07 ENCOUNTER — Ambulatory Visit
Admission: RE | Admit: 2023-01-07 | Discharge: 2023-01-07 | Disposition: A | Discharge status: Home / Self Care | Payer: Medicare PPO | Source: Ambulatory Visit | Attending: Radiation Oncology | Admitting: Radiation Oncology

## 2023-01-07 DIAGNOSIS — Z510 Encounter for antineoplastic radiation therapy: Secondary | ICD-10-CM | POA: Diagnosis not present

## 2023-01-07 DIAGNOSIS — C3432 Malignant neoplasm of lower lobe, left bronchus or lung: Secondary | ICD-10-CM

## 2023-01-07 LAB — RAD ONC ARIA SESSION SUMMARY
Course Elapsed Days: 44
Plan Fractions Treated to Date: 29
Plan Prescribed Dose Per Fraction: 2 Gy
Plan Total Fractions Prescribed: 35
Plan Total Prescribed Dose: 70 Gy
Reference Point Dosage Given to Date: 58 Gy
Reference Point Session Dosage Given: 2 Gy
Session Number: 29

## 2023-01-07 MED FILL — Dexamethasone Sodium Phosphate Inj 100 MG/10ML: INTRAMUSCULAR | Qty: 1 | Status: AC

## 2023-01-08 ENCOUNTER — Inpatient Hospital Stay: Payer: Medicare PPO

## 2023-01-08 ENCOUNTER — Other Ambulatory Visit: Payer: Self-pay

## 2023-01-08 ENCOUNTER — Encounter: Payer: Self-pay | Admitting: Oncology

## 2023-01-08 ENCOUNTER — Ambulatory Visit
Admission: RE | Admit: 2023-01-08 | Discharge: 2023-01-08 | Disposition: A | Discharge status: Home / Self Care | Payer: Medicare PPO | Source: Ambulatory Visit | Attending: Radiation Oncology | Admitting: Radiation Oncology

## 2023-01-08 ENCOUNTER — Encounter: Payer: Self-pay | Admitting: *Deleted

## 2023-01-08 ENCOUNTER — Ambulatory Visit: Payer: Medicare PPO

## 2023-01-08 ENCOUNTER — Inpatient Hospital Stay (HOSPITAL_BASED_OUTPATIENT_CLINIC_OR_DEPARTMENT_OTHER): Payer: Medicare PPO | Admitting: Oncology

## 2023-01-08 VITALS — BP 120/76 | HR 93 | Temp 97.6°F | Resp 16 | Ht 72.0 in | Wt 175.0 lb

## 2023-01-08 DIAGNOSIS — C3432 Malignant neoplasm of lower lobe, left bronchus or lung: Secondary | ICD-10-CM | POA: Diagnosis not present

## 2023-01-08 DIAGNOSIS — Z510 Encounter for antineoplastic radiation therapy: Secondary | ICD-10-CM | POA: Diagnosis not present

## 2023-01-08 LAB — CMP (CANCER CENTER ONLY)
ALT: 11 U/L (ref 0–44)
AST: 19 U/L (ref 15–41)
Albumin: 3.4 g/dL — ABNORMAL LOW (ref 3.5–5.0)
Alkaline Phosphatase: 66 U/L (ref 38–126)
Anion gap: 10 (ref 5–15)
BUN: 10 mg/dL (ref 8–23)
CO2: 27 mmol/L (ref 22–32)
Calcium: 8.9 mg/dL (ref 8.9–10.3)
Chloride: 95 mmol/L — ABNORMAL LOW (ref 98–111)
Creatinine: 0.56 mg/dL — ABNORMAL LOW (ref 0.61–1.24)
GFR, Estimated: >60 mL/min (ref >60)
Glucose, Bld: 130 mg/dL — ABNORMAL HIGH (ref 70–99)
Potassium: 4.1 mmol/L (ref 3.5–5.1)
Sodium: 132 mmol/L — ABNORMAL LOW (ref 135–145)
Total Bilirubin: 0.5 mg/dL (ref 0.3–1.2)
Total Protein: 7 g/dL (ref 6.5–8.1)

## 2023-01-08 LAB — RAD ONC ARIA SESSION SUMMARY
Course Elapsed Days: 45
Plan Fractions Treated to Date: 30
Plan Prescribed Dose Per Fraction: 2 Gy
Plan Total Fractions Prescribed: 35
Plan Total Prescribed Dose: 70 Gy
Reference Point Dosage Given to Date: 60 Gy
Reference Point Session Dosage Given: 2 Gy
Session Number: 30

## 2023-01-08 LAB — CBC WITH DIFFERENTIAL (CANCER CENTER ONLY)
Abs Immature Granulocytes: 0.01 10*3/uL (ref 0.00–0.07)
Basophils Absolute: 0 10*3/uL (ref 0.0–0.1)
Basophils Relative: 1 %
Eosinophils Absolute: 0 10*3/uL (ref 0.0–0.5)
Eosinophils Relative: 1 %
HCT: 32.1 % — ABNORMAL LOW (ref 39.0–52.0)
Hemoglobin: 11.3 g/dL — ABNORMAL LOW (ref 13.0–17.0)
Immature Granulocytes: 1 %
Lymphocytes Relative: 5 %
Lymphs Abs: 0.1 10*3/uL — ABNORMAL LOW (ref 0.7–4.0)
MCH: 33.5 pg (ref 26.0–34.0)
MCHC: 35.2 g/dL (ref 30.0–36.0)
MCV: 95.3 fL (ref 80.0–100.0)
Monocytes Absolute: 0.2 10*3/uL (ref 0.1–1.0)
Monocytes Relative: 8 %
Neutro Abs: 1.6 10*3/uL — ABNORMAL LOW (ref 1.7–7.7)
Neutrophils Relative %: 84 %
Platelet Count: 93 10*3/uL — ABNORMAL LOW (ref 150–400)
RBC: 3.37 MIL/uL — ABNORMAL LOW (ref 4.22–5.81)
RDW: 17.3 % — ABNORMAL HIGH (ref 11.5–15.5)
WBC Count: 1.8 10*3/uL — ABNORMAL LOW (ref 4.0–10.5)
nRBC: 0 % (ref 0.0–0.2)

## 2023-01-08 MED ORDER — SODIUM CHLORIDE 0.9 % IV SOLN
10.0000 mg | Freq: Once | INTRAVENOUS | Status: AC
  Administered 2023-01-08: 10 mg via INTRAVENOUS
  Filled 2023-01-08: qty 10

## 2023-01-08 MED ORDER — SODIUM CHLORIDE 0.9 % IV SOLN
45.0000 mg/m2 | Freq: Once | INTRAVENOUS | Status: AC
  Administered 2023-01-08: 90 mg via INTRAVENOUS
  Filled 2023-01-08: qty 15

## 2023-01-08 MED ORDER — HYDROCODONE-ACETAMINOPHEN 5-325 MG PO TABS: 1.0000 | ORAL_TABLET | Freq: Three times a day (TID) | ORAL | 0 refills | Status: DC | PRN

## 2023-01-08 MED ORDER — PALONOSETRON HCL INJECTION 0.25 MG/5ML
0.2500 mg | Freq: Once | INTRAVENOUS | Status: AC
  Administered 2023-01-08: 0.25 mg via INTRAVENOUS
  Filled 2023-01-08: qty 5

## 2023-01-08 MED ORDER — DIPHENHYDRAMINE HCL 50 MG/ML IJ SOLN
25.0000 mg | Freq: Once | INTRAMUSCULAR | Status: AC
  Administered 2023-01-08: 25 mg via INTRAVENOUS
  Filled 2023-01-08: qty 1

## 2023-01-08 MED ORDER — FAMOTIDINE 20 MG IN NS 100 ML IVPB
20.0000 mg | Freq: Once | INTRAVENOUS | Status: AC
  Administered 2023-01-08: 20 mg via INTRAVENOUS
  Filled 2023-01-08: qty 20

## 2023-01-08 MED ORDER — HEPARIN SOD (PORK) LOCK FLUSH 100 UNIT/ML IV SOLN
500.0000 [IU] | Freq: Once | INTRAVENOUS | Status: AC | PRN
  Administered 2023-01-08: 500 [IU]
  Filled 2023-01-08: qty 5

## 2023-01-08 MED ORDER — SODIUM CHLORIDE 0.9 % IV SOLN
272.8000 mg | Freq: Once | INTRAVENOUS | Status: AC
  Administered 2023-01-08: 270 mg via INTRAVENOUS
  Filled 2023-01-08: qty 27

## 2023-01-08 MED ORDER — SODIUM CHLORIDE 0.9 % IV SOLN
Freq: Once | INTRAVENOUS | Status: AC
  Filled 2023-01-08: qty 250

## 2023-01-08 MED ORDER — FAMOTIDINE IN NACL 20-0.9 MG/50ML-% IV SOLN: 20.0000 mg | Freq: Once | INTRAVENOUS | Status: DC

## 2023-01-08 NOTE — Patient Instructions (Signed)
[facility]  Discharge Instructions: Thank you for choosing [facility] to provide your oncology and hematology care.  If you have a lab appointment with the Cancer Center, please go directly to the Cancer Center and check in at the registration area.  Wear comfortable clothing and clothing appropriate for easy access to any Portacath or PICC line.   We strive to give you quality time with your provider. You may need to reschedule your appointment if you arrive late (15 or more minutes).  Arriving late affects you and other patients whose appointments are after yours.  Also, if you miss three or more appointments without notifying the office, you may be dismissed from the clinic at the provider's discretion.      For prescription refill requests, have your pharmacy contact our office and allow 72 hours for refills to be completed.    Today you received the following chemotherapy and/or immunotherapy agents Paclitaxel and Carboplatin.      To help prevent nausea and vomiting after your treatment, we encourage you to take your nausea medication as directed.  BELOW ARE SYMPTOMS THAT SHOULD BE REPORTED IMMEDIATELY: *FEVER GREATER THAN 100.4 F (38 C) OR HIGHER *CHILLS OR SWEATING *NAUSEA AND VOMITING THAT IS NOT CONTROLLED WITH YOUR NAUSEA MEDICATION *UNUSUAL SHORTNESS OF BREATH *UNUSUAL BRUISING OR BLEEDING *URINARY PROBLEMS (pain or burning when urinating, or frequent urination) *BOWEL PROBLEMS (unusual diarrhea, constipation, pain near the anus) TENDERNESS IN MOUTH AND THROAT WITH OR WITHOUT PRESENCE OF ULCERS (sore throat, sores in mouth, or a toothache) UNUSUAL RASH, SWELLING OR PAIN  UNUSUAL VAGINAL DISCHARGE OR ITCHING   Items with * indicate a potential emergency and should be followed up as soon as possible or go to the Emergency Department if any problems should occur.  Please show the CHEMOTHERAPY ALERT CARD or IMMUNOTHERAPY ALERT  CARD at check-in to the Emergency Department and triage nurse.  Should you have questions after your visit or need to cancel or reschedule your appointment, please contact [facility]  [phone] and follow the prompts.  Office hours are 8:00 a.m. to 4:30 p.m. Monday - Friday. Please note that voicemails left after 4:00 p.m. may not be returned until the following business day.  We are closed weekends and major holidays. You have access to a nurse at all times for urgent questions. Please call the main number to the clinic [phone] and follow the prompts.  For any non-urgent questions, you may also contact your provider using MyChart. We now offer e-Visits for anyone 18 and older to request care online for non-urgent symptoms. For details visit [email or web address].   Also download the MyChart app! Go to the app store, search "MyChart", open the app, select [facility], and log in with your MyChart username and password.    

## 2023-01-08 NOTE — Progress Notes (Signed)
Would like refill on hydrocodone.  ?

## 2023-01-08 NOTE — Progress Notes (Signed)
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  INTERVAL HISTORY: Patient returns to clinic today for further evaluation and consideration of cycle 7 of weekly carboplatin and Taxol.  He continues his daily XRT.  He is tolerating his treatments well without significant side effects.  His only complaint continues to be chronic bilateral hip pain.  He has no neurologic complaints.  He denies any recent fevers or illnesses.  He has a good appetite and denies weight loss.  He denies any chest pain, shortness of breath, cough, or hemoptysis.  He denies any nausea, vomiting, constipation, or diarrhea.  He has no urinary complaints.  Patient offers no further specific complaints today.  REVIEW OF SYSTEMS:   Review of Systems  Constitutional: Negative.  Negative for fever, malaise/fatigue and weight loss.  Respiratory: Negative.  Negative for cough, hemoptysis and shortness of breath.   Cardiovascular: Negative.  Negative for chest pain and leg swelling.  Gastrointestinal: Negative.  Negative for abdominal pain.  Genitourinary: Negative.  Negative for dysuria.  Musculoskeletal:  Positive for joint pain. Negative for back pain.  Skin: Negative.  Negative for rash.  Neurological: Negative.  Negative for dizziness, focal weakness, weakness and headaches.  Psychiatric/Behavioral: Negative.  The patient is not nervous/anxious.      As per HPI. Otherwise, a complete review of systems is negative.  PAST MEDICAL HISTORY: Past Medical History:  Diagnosis Date   Acute encephalopathy    Acute hypoxic respiratory failure (HCC)    Alcohol abuse    Allergy    Seasonal   Anemia    Ataxia    BPH associated with nocturia    Brain tumor (benign) (HCC)    Carpal tunnel syndrome    bilateral   COPD (chronic obstructive pulmonary disease) (HCC)    Depression    Diabetes mellitus without complication (HCC)    Dyspnea    Headache    Hypertension    Mass of lower lobe of left lung    Meningioma (HCC)    Moderate cognitive impairment    Neuropathy    Both legs   Seizures (HCC)    last seizure was in January   SVT (supraventricular tachycardia) 2018   during intubation in ED    PAST SURGICAL HISTORY: Past Surgical History:  Procedure Laterality Date   CRANIOTOMY Left 11/06/2016   Procedure: LEFT Temporal CRANIOTOMY FOR TUMOR;  Surgeon: Coletta Memos, MD;  Location: [facility] OR;  Service: Neurosurgery;  Laterality: Left;  LEFT Temproal CRANIOTOMY FOR TUMOR    CRANIOTOMY Right 01/20/2020   Procedure: Right frontal craniotomy for tumor resection;  Surgeon: Coletta Memos, MD;  Location: [facility] OR;  Service: Neurosurgery;  Laterality: Right;   HERNIA REPAIR     UMBICIAL   IR IMAGING GUIDED PORT INSERTION  11/18/2022   VASECTOMY     VIDEO BRONCHOSCOPY WITH ENDOBRONCHIAL ULTRASOUND N/A 11/03/2022   Procedure: VIDEO BRONCHOSCOPY WITH ENDOBRONCHIAL ULTRASOUND;  Surgeon: Vida Rigger, MD;  Location: [facility] ORS;  Service: Thoracic;  Laterality:  N/A;    FAMILY HISTORY: Family History  Problem Relation Age of Onset   Cancer Sister        Breast    ADVANCED DIRECTIVES (Y/N):  N  HEALTH MAINTENANCE: Social History   Tobacco Use   Smoking status: Every Day    Packs/day: 2.00    Years: 43.00    Additional pack years: 0.00    Total pack years: 86.00    Types: Cigarettes   Smokeless tobacco: Never  Vaping Use   Vaping Use:  Never used  Substance Use Topics   Alcohol use: Yes    Comment: 7 beers daily   Drug use: Yes    Types: Marijuana    Comment: everyday     Colonoscopy:  PAP:  Bone density:  Lipid panel:  Allergies  Allergen Reactions   Keppra [Levetiracetam] Anxiety and Other (See Comments)    Severe mood changes   Chantix [Varenicline] Hives   Wasp Venom Swelling   Penicillins Rash    Has patient had a PCN reaction causing immediate rash, facial/tongue/throat swelling, SOB or lightheadedness with hypotension:YES Has patient had a PCN reaction causing severe rash involving mucus membranes or skin necrosis: NO Has patient had a PCN reaction that required hospitalization NO Has patient had a PCN reaction occurring within the last 10 years: NO If all of the above answers are "NO", then may proceed with Cephalosporin use.    Current Outpatient Medications  Medication Sig Dispense Refill   acetaminophen (TYLENOL) 500 MG tablet Take 1,000 mg by mouth every 6 (six) hours as needed.     apixaban (ELIQUIS) 5 MG TABS tablet Take 1 tablet (5 mg total) by mouth 2 (two) times daily. 60 tablet 1   Cholecalciferol (VITAMIN D-1000 MAX ST) 25 MCG (1000 UT) tablet Take by mouth.     cholecalciferol (VITAMIN D3) 25 MCG (1000 UNIT) tablet Take 1,000 Units by mouth daily.     Fluticasone-Umeclidin-Vilant (TRELEGY ELLIPTA) 100-62.5-25 MCG/ACT AEPB Inhale 1 puff into the lungs every morning.     gabapentin (NEURONTIN) 400 MG capsule Take 400 mg by mouth 3 (three) times daily.     HYDROcodone-acetaminophen (NORCO) 5-325 MG tablet Take 1 tablet by mouth every 8 (eight) hours as needed for moderate pain. 60 tablet 0   lamoTRIgine (LAMICTAL) 100 MG tablet Take 1 tablet (100 mg total) by mouth 2 (two) times daily. 60 tablet 5   losartan (COZAAR) 25 MG tablet Take 0.5 tablets (12.5 mg total) by mouth daily. Take half a tablet (12.5 mg) once daily 45 tablet 3   metoprolol succinate (TOPROL XL) 25 MG 24 hr tablet Take 1  tablet (25 mg total) by mouth daily. 90 tablet 0   predniSONE (DELTASONE) 10 MG tablet Take 10 mg by mouth daily.     prochlorperazine (COMPAZINE) 10 MG tablet TAKE 1 TABLET(10 MG) BY MOUTH EVERY 6 HOURS AS NEEDED FOR NAUSEA OR VOMITING 60 tablet 1   sulfamethoxazole-trimethoprim (BACTRIM) 400-80 MG tablet Take 1 tablet by mouth 3 (three) times a week.     Fluticasone-Umeclidin-Vilant (TRELEGY ELLIPTA) 100-62.5-25 MCG/ACT AEPB Inhale into the lungs. (Patient not taking: Reported on 12/03/2022)     gabapentin (NEURONTIN) 300 MG capsule Take 1 capsule (300 mg) by mouth once a day for 1 week, then 1 capsule (300 mg) by mouth two times a day for 1 week, then 1 capsule (300 mg) by mouth three times a day. (Patient not taking: Reported on 12/03/2022) 120 capsule 5  lidocaine-prilocaine (EMLA) cream Apply to affected area once (Patient not taking: Reported on 01/06/2023) 30 g 3   Multiple Vitamin (MULTI-VITAMIN) tablet Take 1 tablet by mouth daily. (Patient not taking: Reported on 12/04/2022)     nicotine (NICOTROL) 10 MG inhaler Inhale 1 continuous puffing into the lungs as needed for smoking cessation. (Patient not taking: Reported on 01/06/2023)     nystatin (MYCOSTATIN) 100000 UNIT/ML suspension Take 5 mLs by mouth 4 (four) times daily. (Patient not taking: Reported on 12/03/2022)     ondansetron (ZOFRAN) 8 MG tablet Take 1 tablet (8 mg total) by mouth every 8 (eight) hours as needed for nausea or vomiting. Start on the third day after chemotherapy. (Patient not taking: Reported on 01/06/2023) 60 tablet 1   ondansetron (ZOFRAN) 8 MG tablet Take by mouth. (Patient not taking: Reported on 01/06/2023)     No current facility-administered medications for this visit.    OBJECTIVE: Vitals:   01/08/23 1115  BP: 120/76  Pulse: 93  Resp: 16  Temp: 97.6 F (36.4 C)  SpO2: 99%     Body mass index is 23.73 kg/m.    ECOG FS:0 - Asymptomatic  General: Well-developed, well-nourished, no acute distress.  Sitting  in a wheelchair. Eyes: Pink conjunctiva, anicteric sclera. HEENT: Normocephalic, moist mucous membranes. Lungs: No audible wheezing or coughing. Heart: Regular rate and rhythm. Abdomen: Soft, nontender, no obvious distention. Musculoskeletal: No edema, cyanosis, or clubbing. Neuro: Alert, answering all questions appropriately. Cranial nerves grossly intact. Skin: No rashes or petechiae noted. Psych: Normal affect.  LAB RESULTS:  Lab Results  Component Value Date   NA 135 01/06/2023   K 4.1 01/06/2023   CL 95 (L) 01/06/2023   CO2 30 01/06/2023   GLUCOSE 119 (H) 01/06/2023   BUN 11 01/06/2023   CREATININE 0.66 01/06/2023   CALCIUM 9.2 01/06/2023   PROT 7.0 01/01/2023   ALBUMIN 3.4 (L) 01/01/2023   AST 20 01/01/2023   ALT 14 01/01/2023   ALKPHOS 69 01/01/2023   BILITOT 0.5 01/01/2023   GFRNONAA >60 01/06/2023   GFRAA >60 01/18/2020    Lab Results  Component Value Date   WBC 3.6 (L) 01/06/2023   NEUTROABS 2.7 01/01/2023   HGB 13.2 01/06/2023   HCT 36.7 (L) 01/06/2023   MCV 94.8 01/06/2023   PLT 109 (L) 01/06/2023     STUDIES: ECHOCARDIOGRAM COMPLETE  Result Date: 12/31/2022    ECHOCARDIOGRAM REPORT   Patient Name:   CADELL HEYSER Date of Exam: 12/31/2022 Medical Rec #:  [id number]       Height:       73.0 in Accession #:    [id number]      Weight:       178.0 lb Date of Birth:  02-May-1961       BSA:          2.048 m Patient Age:    61 years        BP:           128/86 mmHg Patient Gender: M               HR:           88 bpm. Exam Location:  [facility] Procedure: 2D Echo, 3D Echo, Cardiac Doppler, Color Doppler and Strain Analysis Indications:    ; I48.92* Unspecified atrial flutter  History:        Patient has no prior history of Echocardiogram examinations.  Arrythmias:Atrial Flutter; Risk Factors:Hypertension, Diabetes                 and Current Smoker.  Sonographer:    Quentin Ore RDMS, RVT, RDCS Referring Phys: [id number] Raymon Mutton DUNN IMPRESSIONS  1. Left  ventricular ejection fraction, by estimation, is 45 to 50%. The left ventricle has mildly decreased function. The left ventricle demonstrates global hypokinesis. There is mild left ventricular hypertrophy. Left ventricular diastolic parameters are indeterminate.  2. Right ventricular systolic function is normal. The right ventricular size is mildly enlarged.  3. The mitral valve is normal in structure. Trivial mitral valve regurgitation.  4. The aortic valve was not well visualized. Aortic valve regurgitation is not visualized. FINDINGS  Left Ventricle: Left ventricular ejection fraction, by estimation, is 45 to 50%. The left ventricle has mildly decreased function. The left ventricle demonstrates global hypokinesis. Global longitudinal strain performed but not reported based on interpreter judgement due to suboptimal tracking. The left ventricular internal cavity size was normal in size. There is mild left ventricular hypertrophy. Left ventricular diastolic parameters are indeterminate. Right Ventricle: The right ventricular size is mildly enlarged. No increase in right ventricular wall thickness. Right ventricular systolic function is normal. Left Atrium: Left atrial size was normal in size. Right Atrium: Right atrial size was normal in size. Pericardium: There is no evidence of pericardial effusion. Mitral Valve: The mitral valve is normal in structure. Trivial mitral valve regurgitation. Tricuspid Valve: The tricuspid valve is normal in structure. Tricuspid valve regurgitation is not demonstrated. Aortic Valve: The aortic valve was not well visualized. Aortic valve regurgitation is not visualized. Aortic valve mean gradient measures 2.0 mmHg. Aortic valve peak gradient measures 3.5 mmHg. Aortic valve area, by VTI measures 3.79 cm. Pulmonic Valve: The pulmonic valve was not well visualized. Pulmonic valve regurgitation is not visualized. Aorta: The aortic root and ascending aorta are structurally normal, with no  evidence of dilitation. Venous: The inferior vena cava was not well visualized. IAS/Shunts: No atrial level shunt detected by color flow Doppler.  LEFT VENTRICLE PLAX 2D LVIDd:         5.70 cm LVIDs:         4.40 cm LV PW:         1.10 cm LV IVS:        1.30 cm LVOT diam:     2.30 cm      3D Volume EF: LV SV:         58           3D EF:        40 % LV SV Index:   28           LV EDV:       129 ml LVOT Area:     4.15 cm     LV ESV:       77 ml                             LV SV:        51 ml  LV Volumes (MOD) LV vol d, MOD A2C: 89.8 ml LV vol d, MOD A4C: 105.0 ml LV vol s, MOD A2C: 53.1 ml LV vol s, MOD A4C: 61.5 ml LV SV MOD A2C:     36.7 ml LV SV MOD A4C:     105.0 ml LV SV MOD BP:      40.9 ml RIGHT VENTRICLE RV  Basal diam:  4.20 cm RV S prime:     10.30 cm/s TAPSE (M-mode): 1.7 cm LEFT ATRIUM             Index        RIGHT ATRIUM           Index LA diam:        5.00 cm 2.44 cm/m   RA Area:     18.10 cm LA Vol (A2C):   71.1 ml 34.72 ml/m  RA Volume:   49.80 ml  24.32 ml/m LA Vol (A4C):   57.9 ml 28.28 ml/m LA Biplane Vol: 65.6 ml 32.04 ml/m  AORTIC VALVE                    PULMONIC VALVE AV Area (Vmax):    3.54 cm     PV Vmax:       0.82 m/s AV Area (Vmean):   3.45 cm     PV Peak grad:  2.7 mmHg AV Area (VTI):     3.79 cm AV Vmax:           94.10 cm/s AV Vmean:          63.000 cm/s AV VTI:            0.152 m AV Peak Grad:      3.5 mmHg AV Mean Grad:      2.0 mmHg LVOT Vmax:         80.23 cm/s LVOT Vmean:        52.333 cm/s LVOT VTI:          0.139 m LVOT/AV VTI ratio: 0.91  AORTA Ao Root diam: 3.40 cm Ao Asc diam:  3.40 cm Ao Arch diam: 2.5 cm TRICUSPID VALVE TR Peak grad:   19.4 mmHg TR Vmax:        220.00 cm/s  SHUNTS Systemic VTI:  0.14 m Systemic Diam: 2.30 cm Debbe Odea MD Electronically signed by Debbe Odea MD Signature Date/Time: 12/31/2022/3:46:05 PM    Final     ASSESSMENT: 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  PLAN:    Stage IIIA squamous cell  carcinoma of the left lower lobe lung: Diagnosis confirmed by biopsy on November 03, 2022.  PET scan results from September 11, 2022 reviewed independently confirming stage of disease.  MRI of the brain on September 22, 2022 did not reveal any metastatic disease. He will benefit from concurrent chemotherapy with weekly carboplatin and Taxol with 5-8 cycles along with daily XRT.  Once he completes his concurrent treatment, he will benefit from maintenance durvalumab every 2 weeks for an entire year.  Patient has had port placement.  Proceed with cycle 7 of treatment today.  Continue daily XRT completing treatment on Jan 15, 2023.  Return to clinic in 1 week for further evaluation and consideration of his eighth and final cycle of treatment.   Hyponatremia: Mild.  Patient's sodium is 132 today. Anemia: Mild.  Patient's hemoglobin is 11.3 today. Thrombocytopenia: Platelet count has improved to 93.  Proceed cautiously with treatment as above.   Leukopenia: Total white cell count has decreased, but patient's ANC is adequate to treat.   Joint pain: Likely secondary to arthritis and unrelated to malignancy.  Patient was given a referral to orthopedics, but he has not yet scheduled the appointment.  Patient was given a refill of Norco today, but expressed understanding that once he completes his chemotherapy this clinic will  no longer be managing his chronic pain.  Patient expressed understanding and was in agreement with this plan. He also understands that He can call clinic at any time with any questions, concerns, or complaints.    Cancer Staging  Squamous cell carcinoma of lower lobe of left lung [facility]) Staging form: Lung, AJCC 8th Edition - Clinical stage from 11/11/2022: Stage IIIA (cT4, cN1, cM0) - Signed by Jeralyn Ruths, MD on 11/11/2022 Stage prefix: Initial diagnosis   Jeralyn Ruths, MD   01/08/2023 11:30 AM

## 2023-01-09 ENCOUNTER — Inpatient Hospital Stay: Payer: Medicare PPO

## 2023-01-09 ENCOUNTER — Other Ambulatory Visit: Payer: Self-pay

## 2023-01-09 ENCOUNTER — Encounter: Payer: Self-pay | Admitting: Oncology

## 2023-01-09 ENCOUNTER — Encounter: Payer: Self-pay | Admitting: *Deleted

## 2023-01-09 ENCOUNTER — Ambulatory Visit
Admission: RE | Admit: 2023-01-09 | Discharge: 2023-01-09 | Disposition: A | Discharge status: Home / Self Care | Payer: Medicare PPO | Source: Ambulatory Visit | Attending: Radiation Oncology | Admitting: Radiation Oncology

## 2023-01-09 ENCOUNTER — Ambulatory Visit: Payer: Medicare PPO

## 2023-01-09 DIAGNOSIS — Z510 Encounter for antineoplastic radiation therapy: Secondary | ICD-10-CM | POA: Diagnosis not present

## 2023-01-09 LAB — RAD ONC ARIA SESSION SUMMARY
Course Elapsed Days: 46
Plan Fractions Treated to Date: 31
Plan Prescribed Dose Per Fraction: 2 Gy
Plan Total Fractions Prescribed: 35
Plan Total Prescribed Dose: 70 Gy
Reference Point Dosage Given to Date: 62 Gy
Reference Point Session Dosage Given: 2 Gy
Session Number: 31

## 2023-01-10 ENCOUNTER — Other Ambulatory Visit: Payer: Self-pay

## 2023-01-13 ENCOUNTER — Ambulatory Visit
Admission: RE | Admit: 2023-01-13 | Discharge: 2023-01-13 | Disposition: A | Discharge status: Home / Self Care | Payer: Medicare PPO | Source: Ambulatory Visit | Attending: Radiation Oncology | Admitting: Radiation Oncology

## 2023-01-13 ENCOUNTER — Encounter: Payer: Self-pay | Admitting: *Deleted

## 2023-01-13 ENCOUNTER — Inpatient Hospital Stay: Payer: Medicare PPO

## 2023-01-13 ENCOUNTER — Other Ambulatory Visit: Payer: Self-pay

## 2023-01-13 DIAGNOSIS — Z510 Encounter for antineoplastic radiation therapy: Secondary | ICD-10-CM | POA: Diagnosis not present

## 2023-01-13 LAB — RAD ONC ARIA SESSION SUMMARY
Course Elapsed Days: 50
Plan Fractions Treated to Date: 32
Plan Prescribed Dose Per Fraction: 2 Gy
Plan Total Fractions Prescribed: 35
Plan Total Prescribed Dose: 70 Gy
Reference Point Dosage Given to Date: 64 Gy
Reference Point Session Dosage Given: 2 Gy
Session Number: 32

## 2023-01-14 ENCOUNTER — Other Ambulatory Visit: Payer: Self-pay

## 2023-01-14 ENCOUNTER — Inpatient Hospital Stay: Payer: Medicare PPO

## 2023-01-14 ENCOUNTER — Ambulatory Visit
Admission: RE | Admit: 2023-01-14 | Discharge: 2023-01-14 | Disposition: A | Discharge status: Home / Self Care | Payer: Medicare PPO | Source: Ambulatory Visit | Attending: Radiation Oncology | Admitting: Radiation Oncology

## 2023-01-14 ENCOUNTER — Ambulatory Visit: Payer: Medicare PPO

## 2023-01-14 DIAGNOSIS — Z510 Encounter for antineoplastic radiation therapy: Secondary | ICD-10-CM | POA: Diagnosis not present

## 2023-01-14 LAB — RAD ONC ARIA SESSION SUMMARY
Course Elapsed Days: 51
Plan Fractions Treated to Date: 33
Plan Prescribed Dose Per Fraction: 2 Gy
Plan Total Fractions Prescribed: 35
Plan Total Prescribed Dose: 70 Gy
Reference Point Dosage Given to Date: 66 Gy
Reference Point Session Dosage Given: 2 Gy
Session Number: 33

## 2023-01-14 MED FILL — Dexamethasone Sodium Phosphate Inj 100 MG/10ML: INTRAMUSCULAR | Qty: 1 | Status: AC

## 2023-01-15 ENCOUNTER — Inpatient Hospital Stay: Payer: Medicare PPO | Admitting: Oncology

## 2023-01-15 ENCOUNTER — Other Ambulatory Visit: Payer: Self-pay | Admitting: Oncology

## 2023-01-15 ENCOUNTER — Inpatient Hospital Stay: Payer: Medicare PPO

## 2023-01-15 ENCOUNTER — Ambulatory Visit: Payer: Medicare PPO

## 2023-01-15 DIAGNOSIS — C3432 Malignant neoplasm of lower lobe, left bronchus or lung: Secondary | ICD-10-CM

## 2023-01-16 ENCOUNTER — Other Ambulatory Visit: Payer: Self-pay

## 2023-01-16 ENCOUNTER — Inpatient Hospital Stay: Payer: Medicare PPO

## 2023-01-16 ENCOUNTER — Telehealth: Payer: Self-pay | Admitting: Cardiovascular Disease

## 2023-01-16 ENCOUNTER — Ambulatory Visit: Payer: Medicare PPO

## 2023-01-16 NOTE — Telephone Encounter (Signed)
Spoke with patients wife. She states that patient is sick and unable to have his procedure on 6/3. Inquired if we could reschedule now but she states they will call once he is feeling better and had no further questions at this time.    Called scheduling and cancelled the DCCV scheduled for 6/3 will notify providers.

## 2023-01-16 NOTE — Telephone Encounter (Signed)
Patient's wife is calling to reschedule his procedure he has schedule for 6/3 with Dr. Kirke Corin.  She states patient has been sick all this week.

## 2023-01-19 ENCOUNTER — Ambulatory Visit: Payer: Medicare PPO

## 2023-01-19 ENCOUNTER — Ambulatory Visit: Admission: RE | Admit: 2023-01-19 | Payer: Medicare PPO | Source: Home / Self Care | Admitting: Cardiovascular Disease

## 2023-01-19 ENCOUNTER — Inpatient Hospital Stay: Payer: Medicare PPO

## 2023-01-19 ENCOUNTER — Inpatient Hospital Stay: Payer: Medicare PPO | Admitting: Oncology

## 2023-01-19 ENCOUNTER — Encounter: Admission: RE | Payer: Medicare PPO | Source: Home / Self Care

## 2023-01-19 ENCOUNTER — Inpatient Hospital Stay: Payer: Medicare PPO | Attending: Oncology

## 2023-01-19 SURGERY — CARDIOVERSION
Anesthesia: General

## 2023-01-20 ENCOUNTER — Other Ambulatory Visit: Payer: Self-pay

## 2023-01-20 ENCOUNTER — Inpatient Hospital Stay: Payer: Medicare PPO

## 2023-01-20 ENCOUNTER — Ambulatory Visit
Admission: RE | Admit: 2023-01-20 | Discharge: 2023-01-20 | Disposition: A | Discharge status: Home / Self Care | Payer: Medicare PPO | Source: Ambulatory Visit | Attending: Radiation Oncology | Admitting: Radiation Oncology

## 2023-01-20 ENCOUNTER — Encounter: Payer: Self-pay | Admitting: *Deleted

## 2023-01-20 DIAGNOSIS — C3432 Malignant neoplasm of lower lobe, left bronchus or lung: Secondary | ICD-10-CM | POA: Insufficient documentation

## 2023-01-20 DIAGNOSIS — Z510 Encounter for antineoplastic radiation therapy: Secondary | ICD-10-CM | POA: Insufficient documentation

## 2023-01-20 LAB — RAD ONC ARIA SESSION SUMMARY
Course Elapsed Days: 57
Plan Fractions Treated to Date: 34
Plan Prescribed Dose Per Fraction: 2 Gy
Plan Total Fractions Prescribed: 35
Plan Total Prescribed Dose: 70 Gy
Reference Point Dosage Given to Date: 68 Gy
Reference Point Session Dosage Given: 2 Gy
Session Number: 34

## 2023-01-21 ENCOUNTER — Ambulatory Visit: Payer: Medicare PPO

## 2023-01-21 ENCOUNTER — Encounter: Payer: Self-pay | Admitting: *Deleted

## 2023-01-21 ENCOUNTER — Ambulatory Visit
Admission: RE | Admit: 2023-01-21 | Discharge: 2023-01-21 | Disposition: A | Discharge status: Home / Self Care | Payer: Medicare PPO | Source: Ambulatory Visit | Attending: Radiation Oncology | Admitting: Radiation Oncology

## 2023-01-21 ENCOUNTER — Other Ambulatory Visit: Payer: Self-pay

## 2023-01-21 ENCOUNTER — Inpatient Hospital Stay: Payer: Medicare PPO

## 2023-01-21 DIAGNOSIS — Z510 Encounter for antineoplastic radiation therapy: Secondary | ICD-10-CM | POA: Diagnosis not present

## 2023-01-21 LAB — RAD ONC ARIA SESSION SUMMARY
Course Elapsed Days: 58
Plan Fractions Treated to Date: 35
Plan Prescribed Dose Per Fraction: 2 Gy
Plan Total Fractions Prescribed: 35
Plan Total Prescribed Dose: 70 Gy
Reference Point Dosage Given to Date: 70 Gy
Reference Point Session Dosage Given: 2 Gy
Session Number: 35

## 2023-01-23 ENCOUNTER — Inpatient Hospital Stay: Payer: Medicare PPO

## 2023-01-23 ENCOUNTER — Inpatient Hospital Stay (HOSPITAL_BASED_OUTPATIENT_CLINIC_OR_DEPARTMENT_OTHER): Payer: Medicare PPO | Admitting: Oncology

## 2023-01-23 ENCOUNTER — Other Ambulatory Visit: Payer: Self-pay

## 2023-01-23 ENCOUNTER — Telehealth: Payer: Self-pay

## 2023-01-23 ENCOUNTER — Encounter: Payer: Self-pay | Admitting: Oncology

## 2023-01-23 VITALS — BP 113/99 | HR 110 | Temp 98.0°F | Resp 16 | Ht 72.0 in | Wt 173.4 lb

## 2023-01-23 DIAGNOSIS — E871 Hypo-osmolality and hyponatremia: Secondary | ICD-10-CM | POA: Insufficient documentation

## 2023-01-23 DIAGNOSIS — C3432 Malignant neoplasm of lower lobe, left bronchus or lung: Secondary | ICD-10-CM | POA: Insufficient documentation

## 2023-01-23 DIAGNOSIS — Z923 Personal history of irradiation: Secondary | ICD-10-CM | POA: Insufficient documentation

## 2023-01-23 DIAGNOSIS — D649 Anemia, unspecified: Secondary | ICD-10-CM | POA: Insufficient documentation

## 2023-01-23 DIAGNOSIS — F17210 Nicotine dependence, cigarettes, uncomplicated: Secondary | ICD-10-CM | POA: Insufficient documentation

## 2023-01-23 DIAGNOSIS — M25551 Pain in right hip: Secondary | ICD-10-CM | POA: Insufficient documentation

## 2023-01-23 DIAGNOSIS — Z7901 Long term (current) use of anticoagulants: Secondary | ICD-10-CM | POA: Insufficient documentation

## 2023-01-23 DIAGNOSIS — Z7952 Long term (current) use of systemic steroids: Secondary | ICD-10-CM | POA: Diagnosis not present

## 2023-01-23 DIAGNOSIS — M25552 Pain in left hip: Secondary | ICD-10-CM

## 2023-01-23 DIAGNOSIS — Z79899 Other long term (current) drug therapy: Secondary | ICD-10-CM | POA: Insufficient documentation

## 2023-01-23 LAB — CBC WITH DIFFERENTIAL (CANCER CENTER ONLY)
Abs Immature Granulocytes: 0.12 10*3/uL — ABNORMAL HIGH (ref 0.00–0.07)
Basophils Absolute: 0.1 10*3/uL (ref 0.0–0.1)
Basophils Relative: 1 %
Eosinophils Absolute: 0 10*3/uL (ref 0.0–0.5)
Eosinophils Relative: 0 %
HCT: 29.9 % — ABNORMAL LOW (ref 39.0–52.0)
Hemoglobin: 10.3 g/dL — ABNORMAL LOW (ref 13.0–17.0)
Immature Granulocytes: 2 %
Lymphocytes Relative: 4 %
Lymphs Abs: 0.3 10*3/uL — ABNORMAL LOW (ref 0.7–4.0)
MCH: 32.4 pg (ref 26.0–34.0)
MCHC: 34.4 g/dL (ref 30.0–36.0)
MCV: 94 fL (ref 80.0–100.0)
Monocytes Absolute: 0.8 10*3/uL (ref 0.1–1.0)
Monocytes Relative: 12 %
Neutro Abs: 5.6 10*3/uL (ref 1.7–7.7)
Neutrophils Relative %: 81 %
Platelet Count: 154 10*3/uL (ref 150–400)
RBC: 3.18 MIL/uL — ABNORMAL LOW (ref 4.22–5.81)
RDW: 18.4 % — ABNORMAL HIGH (ref 11.5–15.5)
WBC Count: 6.8 10*3/uL (ref 4.0–10.5)
nRBC: 0 % (ref 0.0–0.2)

## 2023-01-23 LAB — CMP (CANCER CENTER ONLY)
ALT: 12 U/L (ref 0–44)
AST: 20 U/L (ref 15–41)
Albumin: 3.1 g/dL — ABNORMAL LOW (ref 3.5–5.0)
Alkaline Phosphatase: 54 U/L (ref 38–126)
Anion gap: 11 (ref 5–15)
BUN: <5 mg/dL — ABNORMAL LOW (ref 8–23)
CO2: 28 mmol/L (ref 22–32)
Calcium: 8.8 mg/dL — ABNORMAL LOW (ref 8.9–10.3)
Chloride: 94 mmol/L — ABNORMAL LOW (ref 98–111)
Creatinine: 0.6 mg/dL — ABNORMAL LOW (ref 0.61–1.24)
GFR, Estimated: >60 mL/min (ref >60)
Glucose, Bld: 132 mg/dL — ABNORMAL HIGH (ref 70–99)
Potassium: 3.9 mmol/L (ref 3.5–5.1)
Sodium: 133 mmol/L — ABNORMAL LOW (ref 135–145)
Total Bilirubin: 0.6 mg/dL (ref 0.3–1.2)
Total Protein: 7 g/dL (ref 6.5–8.1)

## 2023-01-23 MED ORDER — HYDROCODONE-ACETAMINOPHEN 5-325 MG PO TABS: 1.0000 | ORAL_TABLET | Freq: Three times a day (TID) | ORAL | 0 refills | Status: DC | PRN

## 2023-01-23 NOTE — Progress Notes (Signed)
RF on pain medication

## 2023-01-23 NOTE — Progress Notes (Signed)
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  INTERVAL HISTORY: Patient returns to clinic today after the conclusion of his XRT for further evaluation and discussion of initiating durvalumab.  He continues to have chronic bilateral hip pain, but otherwise feels well.  He continues to smoke.  He has no neurologic complaints.  He denies any recent fevers or illnesses.  He has a good appetite and denies weight loss.  He denies any chest pain, shortness of breath, cough, or hemoptysis.  He denies any nausea, vomiting, constipation, or diarrhea.  He has no urinary complaints.  Patient offers no further specific complaints today.  REVIEW OF SYSTEMS:   Review of Systems  Constitutional: Negative.  Negative for fever, malaise/fatigue and weight loss.  Respiratory: Negative.  Negative for cough, hemoptysis and shortness of breath.   Cardiovascular: Negative.  Negative for chest pain and leg swelling.  Gastrointestinal: Negative.  Negative for abdominal pain.  Genitourinary: Negative.  Negative for dysuria.  Musculoskeletal:  Positive for joint pain. Negative for back pain.  Skin: Negative.  Negative for rash.  Neurological: Negative.  Negative for dizziness, focal weakness, weakness and headaches.  Psychiatric/Behavioral: Negative.  The patient is not nervous/anxious.     As per HPI. Otherwise, a complete review of  systems is negative.  PAST MEDICAL HISTORY: Past Medical History:  Diagnosis Date   Acute encephalopathy    Acute hypoxic respiratory failure (HCC)    Alcohol abuse    Allergy    Seasonal   Anemia    Ataxia    BPH associated with nocturia    Brain tumor (benign) (HCC)    Carpal tunnel syndrome    bilateral   COPD (chronic obstructive pulmonary disease) (HCC)    Depression    Diabetes mellitus without complication (HCC)    Dyspnea    Headache    Hypertension    Mass of lower lobe of left lung    Meningioma (HCC)    Moderate cognitive impairment    Neuropathy    Both legs   Seizures (HCC)    last seizure was in January   SVT (supraventricular tachycardia) 2018   during intubation in ED    PAST SURGICAL HISTORY: Past Surgical History:  Procedure Laterality Date   CRANIOTOMY Left 11/06/2016   Procedure: LEFT Temporal CRANIOTOMY FOR TUMOR;  Surgeon: Coletta Memos, MD;  Location: [facility] OR;  Service: Neurosurgery;  Laterality: Left;  LEFT Temproal CRANIOTOMY FOR TUMOR    CRANIOTOMY Right 01/20/2020   Procedure: Right frontal craniotomy for tumor resection;  Surgeon: Coletta Memos, MD;  Location[facility] OR;  Service: Neurosurgery;  Laterality: Right;   HERNIA REPAIR     UMBICIAL   IR IMAGING GUIDED PORT INSERTION  11/18/2022   VASECTOMY     VIDEO BRONCHOSCOPY WITH ENDOBRONCHIAL ULTRASOUND N/A 11/03/2022   Procedure: VIDEO BRONCHOSCOPY WITH ENDOBRONCHIAL ULTRASOUND;  Surgeon: Vida Rigger, MD;  Location: [facility] ORS;  Service: Thoracic;  Laterality: N/A;    FAMILY HISTORY: Family History  Problem Relation Age of Onset   Cancer Sister        Breast    ADVANCED DIRECTIVES (Y/N):  N  HEALTH MAINTENANCE: Social History   Tobacco Use   Smoking status: Every Day    Packs/day: 2.00    Years: 43.00    Additional pack years: 0.00    Total pack years: 86.00    Types: Cigarettes   Smokeless tobacco: Never  Vaping Use   Vaping Use: Never used  Substance Use Topics   Alcohol use:  Yes    Comment: 7 beers daily   Drug use: Yes    Types: Marijuana    Comment: everyday     Colonoscopy:  PAP:  Bone density:  Lipid panel:  Allergies  Allergen Reactions   Keppra [Levetiracetam] Anxiety and Other (See Comments)    Severe mood changes   Chantix [Varenicline] Hives   Wasp Venom Swelling   Penicillins Rash    Has patient had a PCN reaction causing immediate rash, facial/tongue/throat swelling, SOB or lightheadedness with hypotension:YES Has patient had a PCN reaction causing severe rash involving mucus membranes or skin necrosis: NO Has patient had a PCN reaction that required hospitalization NO Has patient had a PCN reaction occurring within the last 10 years: NO If all of the above answers are "NO", then may proceed with Cephalosporin use.    Current Outpatient Medications  Medication Sig Dispense Refill   acetaminophen (TYLENOL) 500 MG tablet Take 1,000 mg by mouth every 6 (six) hours as needed.     apixaban (ELIQUIS) 5 MG TABS tablet Take 1 tablet (5 mg total) by mouth 2 (two) times daily. 60 tablet 1   Cholecalciferol (VITAMIN D-1000 MAX ST) 25 MCG (1000 UT) tablet Take by mouth.     cholecalciferol (VITAMIN D3) 25 MCG (1000 UNIT) tablet Take 1,000 Units by mouth daily.     Fluticasone-Umeclidin-Vilant (TRELEGY ELLIPTA) 100-62.5-25 MCG/ACT AEPB Inhale 1 puff into the lungs every morning.     gabapentin (NEURONTIN) 400 MG capsule Take 400 mg by mouth 3 (three) times daily.     HYDROcodone-acetaminophen (NORCO) 5-325 MG tablet Take 1 tablet by mouth every 8 (eight) hours as needed for moderate pain. 60 tablet 0   lamoTRIgine (LAMICTAL) 100 MG tablet Take 1 tablet (100 mg total) by mouth 2 (two) times daily. 60 tablet 5   losartan (COZAAR) 25 MG tablet Take 0.5 tablets (12.5 mg total) by mouth daily. Take half a tablet (12.5 mg) once daily 45 tablet 3   metoprolol succinate (TOPROL XL) 25 MG 24 hr tablet Take 1 tablet (25 mg total) by mouth daily. 90 tablet 0    Fluticasone-Umeclidin-Vilant (TRELEGY ELLIPTA) 100-62.5-25 MCG/ACT AEPB Inhale into the lungs. (Patient not taking: Reported on 12/03/2022)     gabapentin (NEURONTIN) 300 MG capsule Take 1 capsule (300 mg) by mouth once a day for 1 week, then 1 capsule (300 mg) by mouth two times a day for 1 week, then 1 capsule (300 mg) by mouth three times a day. (Patient not taking: Reported on 12/03/2022) 120 capsule 5   Multiple Vitamin (MULTI-VITAMIN) tablet Take 1 tablet by mouth daily. (Patient not taking: Reported on 12/04/2022)     nicotine (NICOTROL) 10 MG inhaler Inhale 1 continuous puffing into the lungs as needed for smoking cessation. (Patient not taking: Reported on 01/06/2023)     nystatin (MYCOSTATIN) 100000 UNIT/ML suspension Take 5 mLs by mouth 4 (four) times daily. (Patient not taking: Reported on 12/03/2022)  ondansetron (ZOFRAN) 8 MG tablet Take by mouth. (Patient not taking: Reported on 01/06/2023)     predniSONE (DELTASONE) 10 MG tablet Take 10 mg by mouth daily.     No current facility-administered medications for this visit.    OBJECTIVE: Vitals:   01/23/23 1114  BP: (!) 113/99  Pulse: (!) 110  Resp: 16  Temp: 98 F (36.7 C)  SpO2: 96%     Body mass index is 23.52 kg/m.    ECOG FS:1 - Symptomatic but completely ambulatory  General: Well-developed, well-nourished, no acute distress.  Sitting in a wheelchair. Eyes: Pink conjunctiva, anicteric sclera. HEENT: Normocephalic, moist mucous membranes. Lungs: No audible wheezing or coughing. Heart: Regular rate and rhythm. Abdomen: Soft, nontender, no obvious distention. Musculoskeletal: No edema, cyanosis, or clubbing. Neuro: Alert, answering all questions appropriately. Cranial nerves grossly intact. Skin: No rashes or petechiae noted. Psych: Normal affect.   LAB RESULTS:  Lab Results  Component Value Date   NA 133 (L) 01/23/2023   K 3.9 01/23/2023   CL 94 (L) 01/23/2023   CO2 28 01/23/2023   GLUCOSE 132 (H) 01/23/2023    BUN <5 (L) 01/23/2023   CREATININE 0.60 (L) 01/23/2023   CALCIUM 8.8 (L) 01/23/2023   PROT 7.0 01/23/2023   ALBUMIN 3.1 (L) 01/23/2023   AST 20 01/23/2023   ALT 12 01/23/2023   ALKPHOS 54 01/23/2023   BILITOT 0.6 01/23/2023   GFRNONAA >60 01/23/2023   GFRAA >60 01/18/2020    Lab Results  Component Value Date   WBC 6.8 01/23/2023   NEUTROABS 5.6 01/23/2023   HGB 10.3 (L) 01/23/2023   HCT 29.9 (L) 01/23/2023   MCV 94.0 01/23/2023   PLT 154 01/23/2023     STUDIES: ECHOCARDIOGRAM COMPLETE  Result Date: 12/31/2022    ECHOCARDIOGRAM REPORT   Patient Name:   EDMAN LIPSEY Date of Exam: 12/31/2022 Medical Rec #:  [id number]       Height:       73.0 in Accession #:    [id number]      Weight:       178.0 lb Date of Birth:  1961/02/05       BSA:          2.048 m Patient Age:    61 years        BP:           128/86 mmHg Patient Gender: M               HR:           88 bpm. Exam Location:  [place] Procedure: 2D Echo, 3D Echo, Cardiac Doppler, Color Doppler and Strain Analysis Indications:    ; I48.92* Unspecified atrial flutter  History:        Patient has no prior history of Echocardiogram examinations.                 Arrythmias:Atrial Flutter; Risk Factors:Hypertension, Diabetes                 and Current Smoker.  Sonographer:    Quentin Ore RDMS, RVT, RDCS Referring Phys: [id number] Raymon Mutton DUNN IMPRESSIONS  1. Left ventricular ejection fraction, by estimation, is 45 to 50%. The left ventricle has mildly decreased function. The left ventricle demonstrates global hypokinesis. There is mild left ventricular hypertrophy. Left ventricular diastolic parameters are indeterminate.  2. Right ventricular systolic function is normal. The right ventricular size is mildly enlarged.  3. The mitral valve is normal  in structure. Trivial mitral valve regurgitation.  4. The aortic valve was not well visualized. Aortic valve regurgitation is not visualized. FINDINGS  Left Ventricle: Left ventricular ejection  fraction, by estimation, is 45 to 50%. The left ventricle has mildly decreased function. The left ventricle demonstrates global hypokinesis. Global longitudinal strain performed but not reported based on interpreter judgement due to suboptimal tracking. The left ventricular internal cavity size was normal in size. There is mild left ventricular hypertrophy. Left ventricular diastolic parameters are indeterminate. Right Ventricle: The right ventricular size is mildly enlarged. No increase in right ventricular wall thickness. Right ventricular systolic function is normal. Left Atrium: Left atrial size was normal in size. Right Atrium: Right atrial size was normal in size. Pericardium: There is no evidence of pericardial effusion. Mitral Valve: The mitral valve is normal in structure. Trivial mitral valve regurgitation. Tricuspid Valve: The tricuspid valve is normal in structure. Tricuspid valve regurgitation is not demonstrated. Aortic Valve: The aortic valve was not well visualized. Aortic valve regurgitation is not visualized. Aortic valve mean gradient measures 2.0 mmHg. Aortic valve peak gradient measures 3.5 mmHg. Aortic valve area, by VTI measures 3.79 cm. Pulmonic Valve: The pulmonic valve was not well visualized. Pulmonic valve regurgitation is not visualized. Aorta: The aortic root and ascending aorta are structurally normal, with no evidence of dilitation. Venous: The inferior vena cava was not well visualized. IAS/Shunts: No atrial level shunt detected by color flow Doppler.  LEFT VENTRICLE PLAX 2D LVIDd:         5.70 cm LVIDs:         4.40 cm LV PW:         1.10 cm LV IVS:        1.30 cm LVOT diam:     2.30 cm      3D Volume EF: LV SV:         58           3D EF:        40 % LV SV Index:   28           LV EDV:       129 ml LVOT Area:     4.15 cm     LV ESV:       77 ml                             LV SV:        51 ml  LV Volumes (MOD) LV vol d, MOD A2C: 89.8 ml LV vol d, MOD A4C: 105.0 ml LV vol s, MOD  A2C: 53.1 ml LV vol s, MOD A4C: 61.5 ml LV SV MOD A2C:     36.7 ml LV SV MOD A4C:     105.0 ml LV SV MOD BP:      40.9 ml RIGHT VENTRICLE RV Basal diam:  4.20 cm RV S prime:     10.30 cm/s TAPSE (M-mode): 1.7 cm LEFT ATRIUM             Index        RIGHT ATRIUM           Index LA diam:        5.00 cm 2.44 cm/m   RA Area:     18.10 cm LA Vol (A2C):   71.1 ml 34.72 ml/m  RA Volume:   49.80 ml  24.32 ml/m LA Vol (A4C):  57.9 ml 28.28 ml/m LA Biplane Vol: 65.6 ml 32.04 ml/m  AORTIC VALVE                    PULMONIC VALVE AV Area (Vmax):    3.54 cm     PV Vmax:       0.82 m/s AV Area (Vmean):   3.45 cm     PV Peak grad:  2.7 mmHg AV Area (VTI):     3.79 cm AV Vmax:           94.10 cm/s AV Vmean:          63.000 cm/s AV VTI:            0.152 m AV Peak Grad:      3.5 mmHg AV Mean Grad:      2.0 mmHg LVOT Vmax:         80.23 cm/s LVOT Vmean:        52.333 cm/s LVOT VTI:          0.139 m LVOT/AV VTI ratio: 0.91  AORTA Ao Root diam: 3.40 cm Ao Asc diam:  3.40 cm Ao Arch diam: 2.5 cm TRICUSPID VALVE TR Peak grad:   19.4 mmHg TR Vmax:        220.00 cm/s  SHUNTS Systemic VTI:  0.14 m Systemic Diam: 2.30 cm Debbe Odea MD Electronically signed by Debbe Odea MD Signature Date/Time: 12/31/2022/3:46:05 PM    Final     ASSESSMENT: 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  PET scan results from September 11, 2022 reviewed independently confirming stage of disease.  MRI of the brain on September 22, 2022 did not reveal any metastatic disease.  Patient received his seventh and final cycle of weekly carboplatin and Taxol on Jan 08, 2023.  He skipped his eighth treatment.  Patient subsequently completed XRT on January 21, 2023.  Proceed with maintenance durvalumab every 2 weeks for 1 year.  Return to clinic on January 29, 2023 to initiate cycle 1.will reimage in July or August  2024.   Hyponatremia: Chronic and unchanged.  Patient sodium is 133. Anemia: Hemoglobin is trended up slightly to 10.3, monitor. Thrombocytopenia: Resolved. Leukopenia: Resolved. Joint pain: Likely secondary to arthritis and unrelated to malignancy.  Patient was given a referral to orthopedics, but he has not yet scheduled the appointment.  Patient was given a refill of Norco today, but has been instructed he will receive no further refills from this clinic.     Patient expressed understanding and was in agreement with this plan. He also understands that He can call clinic at any time with any questions, concerns, or complaints.    Cancer Staging  Squamous cell carcinoma of lower lobe of left lung [facility]) Staging form: Lung, AJCC 8th Edition - Clinical stage from 11/11/2022: Stage IIIA (cT4, cN1, cM0) - Signed by Frederick Ruths, MD on 11/11/2022 Stage prefix: Initial diagnosis   Frederick Ruths, MD   01/23/2023 12:02 PM

## 2023-01-23 NOTE — Telephone Encounter (Signed)
Referral placed to orthopedics. Records faxed to [facility].

## 2023-01-23 NOTE — Progress Notes (Signed)
DISCONTINUE ON PATHWAY REGIMEN - Non-Small Cell Lung     A cycle is every 7 days, concurrent with RT:     Paclitaxel      Carboplatin   **Always confirm dose/schedule in your pharmacy ordering system**  REASON: Continuation Of Treatment PRIOR TREATMENT: [id number]: Carboplatin AUC=2 + Paclitaxel 45 mg/m2 Weekly During Radiation TREATMENT RESPONSE: Partial Response (PR)  START ON PATHWAY REGIMEN - Non-Small Cell Lung     A cycle is every 14 days:     Durvalumab   **Always confirm dose/schedule in your pharmacy ordering system**  Patient Characteristics: Preoperative or Nonsurgical Candidate (Clinical Staging), Stage III - Nonsurgical Candidate (Nonsquamous and Squamous), PS = 0, 1 Therapeutic Status: Preoperative or Nonsurgical Candidate (Clinical Staging) AJCC T Category: cT4 AJCC N Category: cN1 AJCC M Category: cM0 AJCC 8 Stage Grouping: IIIA ECOG Performance Status: 0 Intent of Therapy: Curative Intent, Discussed with Patient

## 2023-01-28 ENCOUNTER — Other Ambulatory Visit: Payer: Self-pay | Admitting: Oncology

## 2023-01-28 ENCOUNTER — Telehealth: Payer: Self-pay | Admitting: *Deleted

## 2023-01-28 NOTE — Telephone Encounter (Signed)
Wife called and stated her husband Frederick Drake had passed away on Feb 21, 2023 and wanted to let us know so appointments could be cancelled.

## 2023-01-29 ENCOUNTER — Inpatient Hospital Stay: Payer: Medicare PPO

## 2023-01-29 ENCOUNTER — Inpatient Hospital Stay: Payer: Medicare PPO | Admitting: Oncology

## 2023-01-30 ENCOUNTER — Other Ambulatory Visit: Payer: Self-pay | Admitting: Physician Assistant

## 2023-02-04 ENCOUNTER — Other Ambulatory Visit: Payer: Self-pay | Admitting: Oncology

## 2023-02-04 DIAGNOSIS — C3432 Malignant neoplasm of lower lobe, left bronchus or lung: Secondary | ICD-10-CM

## 2023-02-10 ENCOUNTER — Ambulatory Visit: Payer: Medicare PPO | Admitting: Physician Assistant

## 2023-02-12 ENCOUNTER — Ambulatory Visit: Payer: Medicare PPO | Admitting: Nurse Practitioner

## 2023-02-12 ENCOUNTER — Ambulatory Visit: Payer: Medicare PPO

## 2023-02-12 ENCOUNTER — Other Ambulatory Visit: Payer: Medicare PPO

## 2023-02-16 DEATH — deceased

## 2023-02-25 ENCOUNTER — Ambulatory Visit: Payer: Medicare PPO | Admitting: Radiation Oncology
